# Patient Record
Sex: Male | Born: 1950
Health system: Southern US, Community
[De-identification: ages and names within clinical notes are randomized; demographics above are authoritative.]

## PROBLEM LIST (undated history)

## (undated) DIAGNOSIS — F419 Anxiety disorder, unspecified: Secondary | ICD-10-CM

## (undated) DIAGNOSIS — Z85828 Personal history of other malignant neoplasm of skin: Secondary | ICD-10-CM

## (undated) DIAGNOSIS — J9600 Acute respiratory failure, unspecified whether with hypoxia or hypercapnia: Secondary | ICD-10-CM

## (undated) DIAGNOSIS — C4492 Squamous cell carcinoma of skin, unspecified: Secondary | ICD-10-CM

## (undated) DIAGNOSIS — R1310 Dysphagia, unspecified: Secondary | ICD-10-CM

## (undated) DIAGNOSIS — B1920 Unspecified viral hepatitis C without hepatic coma: Secondary | ICD-10-CM

## (undated) DIAGNOSIS — I219 Acute myocardial infarction, unspecified: Secondary | ICD-10-CM

## (undated) DIAGNOSIS — C4491 Basal cell carcinoma of skin, unspecified: Secondary | ICD-10-CM

## (undated) DIAGNOSIS — R519 Headache, unspecified: Secondary | ICD-10-CM

## (undated) DIAGNOSIS — C140 Malignant neoplasm of pharynx, unspecified: Secondary | ICD-10-CM

## (undated) DIAGNOSIS — J449 Chronic obstructive pulmonary disease, unspecified: Secondary | ICD-10-CM

## (undated) DIAGNOSIS — R490 Dysphonia: Secondary | ICD-10-CM

## (undated) DIAGNOSIS — F17210 Nicotine dependence, cigarettes, uncomplicated: Secondary | ICD-10-CM

## (undated) DIAGNOSIS — R0600 Dyspnea, unspecified: Secondary | ICD-10-CM

## (undated) DIAGNOSIS — G4730 Sleep apnea, unspecified: Secondary | ICD-10-CM

## (undated) DIAGNOSIS — K029 Dental caries, unspecified: Secondary | ICD-10-CM

## (undated) DIAGNOSIS — C61 Malignant neoplasm of prostate: Secondary | ICD-10-CM

## (undated) DIAGNOSIS — Z79818 Long term (current) use of other agents affecting estrogen receptors and estrogen levels: Secondary | ICD-10-CM

## (undated) DIAGNOSIS — N529 Male erectile dysfunction, unspecified: Secondary | ICD-10-CM

## (undated) HISTORY — DX: Personal history of other malignant neoplasm of skin: Z85.828

## (undated) HISTORY — DX: Chronic obstructive pulmonary disease, unspecified: J44.9

## (undated) HISTORY — DX: Malignant neoplasm of prostate: C61

## (undated) HISTORY — DX: Squamous cell carcinoma of skin, unspecified: C44.92

## (undated) HISTORY — PX: PROSTATE BIOPSY: SHX241

## (undated) HISTORY — PX: FACIAL LACERATION REPAIR: SHX6589

## (undated) HISTORY — PX: SKIN CANCER EXCISION: SHX779

## (undated) HISTORY — DX: Basal cell carcinoma of skin, unspecified: C44.91

## (undated) HISTORY — PX: CHOLECYSTECTOMY: SHX55

## (undated) HISTORY — DX: Malignant neoplasm of pharynx, unspecified: C14.0

## (undated) HISTORY — DX: Unspecified viral hepatitis C without hepatic coma: B19.20

---

## 1990-01-18 HISTORY — PX: HERNIA REPAIR: SHX51

## 2017-09-27 DIAGNOSIS — R0689 Other abnormalities of breathing: Secondary | ICD-10-CM | POA: Diagnosis not present

## 2017-09-27 DIAGNOSIS — F17210 Nicotine dependence, cigarettes, uncomplicated: Secondary | ICD-10-CM | POA: Diagnosis present

## 2017-09-27 DIAGNOSIS — J9622 Acute and chronic respiratory failure with hypercapnia: Secondary | ICD-10-CM | POA: Diagnosis present

## 2017-09-27 DIAGNOSIS — J440 Chronic obstructive pulmonary disease with acute lower respiratory infection: Secondary | ICD-10-CM | POA: Diagnosis present

## 2017-09-27 DIAGNOSIS — R0602 Shortness of breath: Secondary | ICD-10-CM | POA: Diagnosis not present

## 2017-09-27 DIAGNOSIS — J449 Chronic obstructive pulmonary disease, unspecified: Secondary | ICD-10-CM | POA: Diagnosis not present

## 2017-09-27 DIAGNOSIS — J9602 Acute respiratory failure with hypercapnia: Secondary | ICD-10-CM | POA: Diagnosis not present

## 2017-09-27 DIAGNOSIS — R918 Other nonspecific abnormal finding of lung field: Secondary | ICD-10-CM | POA: Diagnosis not present

## 2017-09-27 DIAGNOSIS — J209 Acute bronchitis, unspecified: Secondary | ICD-10-CM | POA: Diagnosis present

## 2017-09-27 DIAGNOSIS — J441 Chronic obstructive pulmonary disease with (acute) exacerbation: Secondary | ICD-10-CM | POA: Diagnosis not present

## 2017-09-27 DIAGNOSIS — E871 Hypo-osmolality and hyponatremia: Secondary | ICD-10-CM | POA: Diagnosis not present

## 2017-09-27 DIAGNOSIS — E878 Other disorders of electrolyte and fluid balance, not elsewhere classified: Secondary | ICD-10-CM | POA: Diagnosis not present

## 2017-09-27 DIAGNOSIS — Z9981 Dependence on supplemental oxygen: Secondary | ICD-10-CM | POA: Diagnosis not present

## 2017-09-27 DIAGNOSIS — C61 Malignant neoplasm of prostate: Secondary | ICD-10-CM | POA: Diagnosis present

## 2017-09-27 DIAGNOSIS — Z79899 Other long term (current) drug therapy: Secondary | ICD-10-CM | POA: Diagnosis not present

## 2017-10-04 DIAGNOSIS — J9611 Chronic respiratory failure with hypoxia: Secondary | ICD-10-CM | POA: Diagnosis not present

## 2017-10-04 DIAGNOSIS — J449 Chronic obstructive pulmonary disease, unspecified: Secondary | ICD-10-CM | POA: Diagnosis not present

## 2017-10-04 DIAGNOSIS — R0902 Hypoxemia: Secondary | ICD-10-CM | POA: Diagnosis not present

## 2017-10-16 DIAGNOSIS — Z792 Long term (current) use of antibiotics: Secondary | ICD-10-CM | POA: Diagnosis not present

## 2017-10-16 DIAGNOSIS — E871 Hypo-osmolality and hyponatremia: Secondary | ICD-10-CM | POA: Diagnosis present

## 2017-10-16 DIAGNOSIS — F17210 Nicotine dependence, cigarettes, uncomplicated: Secondary | ICD-10-CM | POA: Diagnosis present

## 2017-10-16 DIAGNOSIS — R918 Other nonspecific abnormal finding of lung field: Secondary | ICD-10-CM | POA: Diagnosis not present

## 2017-10-16 DIAGNOSIS — J181 Lobar pneumonia, unspecified organism: Secondary | ICD-10-CM | POA: Diagnosis not present

## 2017-10-16 DIAGNOSIS — Z7289 Other problems related to lifestyle: Secondary | ICD-10-CM | POA: Diagnosis not present

## 2017-10-16 DIAGNOSIS — Z79899 Other long term (current) drug therapy: Secondary | ICD-10-CM | POA: Diagnosis not present

## 2017-10-16 DIAGNOSIS — J189 Pneumonia, unspecified organism: Secondary | ICD-10-CM | POA: Diagnosis not present

## 2017-10-16 DIAGNOSIS — A419 Sepsis, unspecified organism: Secondary | ICD-10-CM | POA: Diagnosis not present

## 2017-10-16 DIAGNOSIS — Z9981 Dependence on supplemental oxygen: Secondary | ICD-10-CM | POA: Diagnosis not present

## 2017-10-16 DIAGNOSIS — R0609 Other forms of dyspnea: Secondary | ICD-10-CM | POA: Diagnosis not present

## 2017-10-16 DIAGNOSIS — J440 Chronic obstructive pulmonary disease with acute lower respiratory infection: Secondary | ICD-10-CM | POA: Diagnosis present

## 2017-10-16 DIAGNOSIS — C61 Malignant neoplasm of prostate: Secondary | ICD-10-CM | POA: Diagnosis not present

## 2017-10-16 DIAGNOSIS — J449 Chronic obstructive pulmonary disease, unspecified: Secondary | ICD-10-CM | POA: Diagnosis not present

## 2017-10-16 DIAGNOSIS — Z7951 Long term (current) use of inhaled steroids: Secondary | ICD-10-CM | POA: Diagnosis not present

## 2017-10-20 DIAGNOSIS — F419 Anxiety disorder, unspecified: Secondary | ICD-10-CM | POA: Diagnosis not present

## 2017-10-20 DIAGNOSIS — J189 Pneumonia, unspecified organism: Secondary | ICD-10-CM | POA: Diagnosis not present

## 2017-10-25 DIAGNOSIS — J42 Unspecified chronic bronchitis: Secondary | ICD-10-CM | POA: Diagnosis not present

## 2017-10-25 DIAGNOSIS — J441 Chronic obstructive pulmonary disease with (acute) exacerbation: Secondary | ICD-10-CM | POA: Diagnosis not present

## 2017-11-14 DIAGNOSIS — F419 Anxiety disorder, unspecified: Secondary | ICD-10-CM | POA: Diagnosis not present

## 2017-11-14 DIAGNOSIS — J449 Chronic obstructive pulmonary disease, unspecified: Secondary | ICD-10-CM | POA: Diagnosis not present

## 2017-11-14 DIAGNOSIS — C61 Malignant neoplasm of prostate: Secondary | ICD-10-CM | POA: Diagnosis not present

## 2017-12-12 DIAGNOSIS — J449 Chronic obstructive pulmonary disease, unspecified: Secondary | ICD-10-CM | POA: Diagnosis not present

## 2017-12-12 DIAGNOSIS — F419 Anxiety disorder, unspecified: Secondary | ICD-10-CM | POA: Diagnosis not present

## 2018-01-13 ENCOUNTER — Encounter: Payer: Self-pay | Admitting: Nurse Practitioner

## 2018-01-13 ENCOUNTER — Ambulatory Visit (INDEPENDENT_AMBULATORY_CARE_PROVIDER_SITE_OTHER): Payer: Medicare Other | Admitting: Nurse Practitioner

## 2018-01-13 VITALS — BP 113/66 | HR 85 | Temp 97.8°F | Ht 67.0 in | Wt 107.4 lb

## 2018-01-13 DIAGNOSIS — Z85828 Personal history of other malignant neoplasm of skin: Secondary | ICD-10-CM | POA: Insufficient documentation

## 2018-01-13 DIAGNOSIS — J449 Chronic obstructive pulmonary disease, unspecified: Secondary | ICD-10-CM | POA: Diagnosis not present

## 2018-01-13 DIAGNOSIS — F411 Generalized anxiety disorder: Secondary | ICD-10-CM | POA: Insufficient documentation

## 2018-01-13 DIAGNOSIS — C61 Malignant neoplasm of prostate: Secondary | ICD-10-CM

## 2018-01-13 DIAGNOSIS — Z5181 Encounter for therapeutic drug level monitoring: Secondary | ICD-10-CM | POA: Diagnosis not present

## 2018-01-13 NOTE — Assessment & Plan Note (Signed)
Urine drug screen today.  If negative will sign controlled substance contract next visit and continue Klonopin with every 3 month visits for refills and close monitoring.  Pt refuses alternative options, such as SSRI or Buspar.

## 2018-01-13 NOTE — Patient Instructions (Signed)
Tobacco Use Disorder Tobacco use disorder (TUD) occurs when a person craves, seeks, and uses tobacco, regardless of the consequences. This disorder can cause problems with mental and physical health. It can affect your ability to have healthy relationships, and it can keep you from meeting your responsibilities at work, home, or school. Tobacco may be:  Smoked as a cigarette or cigar.  Inhaled using e-cigarettes.  Smoked in a pipe or hookah.  Chewed as smokeless tobacco.  Inhaled into the nostrils as snuff. Tobacco products contain a dangerous chemical called nicotine, which is very addictive. Nicotine triggers hormones that make the body feel stimulated and works on areas of the brain that make you feel good. These effects can make it hard for people to quit nicotine. Tobacco contains many other unsafe chemicals that can damage almost every organ in the body. Smoking tobacco also puts others in danger due to fire risk and possible health problems caused by breathing in secondhand smoke. What are the signs or symptoms? Symptoms of TUD may include:  Being unable to slow down or stop your tobacco use.  Spending an abnormal amount of time getting or using tobacco.  Craving tobacco. Cravings may last for up to 6 months after quitting.  Tobacco use that: ? Interferes with your work, school, or home life. ? Interferes with your personal and social relationships. ? Makes you give up activities that you once enjoyed or found important.  Using tobacco even though you know that it is: ? Dangerous or bad for your health or someone else's health. ? Causing problems in your life.  Needing more and more of the substance to get the same effect (developing tolerance).  Experiencing unpleasant symptoms if you do not use the substance (withdrawal). Withdrawal symptoms may include: ? Depressed, anxious, or irritable mood. ? Difficulty concentrating. ? Increased appetite. ? Restlessness or trouble  sleeping.  Using the substance to avoid withdrawal. How is this diagnosed? This condition may be diagnosed based on:  Your current and past tobacco use. Your health care provider may ask questions about how your tobacco use affects your life.  A physical exam. You may be diagnosed with TUD if you have at least two symptoms within a 12-month period. How is this treated? This condition is treated by stopping tobacco use. Many people are unable to quit on their own and need help. Treatment may include:  Nicotine replacement therapy (NRT). NRT provides nicotine without the other harmful chemicals in tobacco. NRT gradually lowers the dosage of nicotine in the body and reduces withdrawal symptoms. NRT is available as: ? Over-the-counter gums, lozenges, and skin patches. ? Prescription mouth inhalers and nasal sprays.  Medicine that acts on the brain to reduce cravings and withdrawal symptoms.  A type of talk therapy that examines your triggers for tobacco use, how to avoid them, and how to cope with cravings (behavioral therapy).  Hypnosis. This may help with withdrawal symptoms.  Joining a support group for others coping with TUD. The best treatment for TUD is usually a combination of medicine, talk therapy, and support groups. Recovery can be a long process. Many people start using tobacco again after stopping (relapse). If you relapse, it does not mean that treatment will not work. Follow these instructions at home:  Lifestyle  Do not use any products that contain nicotine or tobacco, such as cigarettes and e-cigarettes.  Avoid things that trigger tobacco use as much as you can. Triggers include people and situations that usually cause you   to use tobacco.  Avoid drinks that contain caffeine, including coffee. These may worsen some withdrawal symptoms.  Find ways to manage stress. Wanting to smoke may cause stress, and stress can make you want to smoke. Relaxation techniques such as  deep breathing, meditation, and yoga may help.  Attend support groups as needed. These groups are an important part of long-term recovery for many people. General instructions  Take over-the-counter and prescription medicines only as told by your health care provider.  Check with your health care provider before taking any new prescription or over-the-counter medicines.  Decide on a friend, family member, or smoking quit-line (such as 1-800-QUIT-NOW in the [place] that you can call or text when you feel the urge to smoke or when you need help coping with cravings.  Keep all follow-up visits as told by your health care provider and therapist. This is important. Contact a health care provider if:  You are not able to take your medicines as prescribed.  Your symptoms get worse, even with treatment. Summary  Tobacco use disorder (TUD) occurs when a person craves, seeks, and uses tobacco regardless of the consequences.  This condition may be diagnosed based on your current and past tobacco use and a physical exam.  Many people are unable to quit on their own and need help. Recovery can be a long process.  The most effective treatment for TUD is usually a combination of medicine, talk therapy, and support groups. This information is not intended to replace advice given to you by your health care provider. Make sure you discuss any questions you have with your health care provider. Document Released: 09/10/2003 Document Revised: 12/22/2016 Document Reviewed: 12/22/2016 Elsevier Interactive Patient Education  2019 Elsevier Inc.  

## 2018-01-13 NOTE — Assessment & Plan Note (Signed)
Referral to urology for further treatment and recommendations.  Patient currently treated with Lupron with past specialist.

## 2018-01-13 NOTE — Assessment & Plan Note (Signed)
Referral to dermatology for skin assessment in patient with significant history of basal and squamous cell removal.

## 2018-01-13 NOTE — Assessment & Plan Note (Signed)
Chronic, ongoing.  Continue current inhaler regimen and home O2.  Referral to pulmonology for further recommendations.

## 2018-01-13 NOTE — Progress Notes (Addendum)
New Patient Office Visit  Subjective:  Patient ID: Kevin Nelson, male    DOB: 1950/08/30  Age: 67 y.o. MRN: [id number]  CC:  Chief Complaint  Patient presents with  . Establish Care    HPI Kevin Nelson presents for new patient visit to establish care.  Introduced to [employer], [employer] role and practice setting.  All questions answered.  [place] and providers had all been there, prior to this lived in [place].  Will have all records transferred to current clinic.  He is currently living locally.  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  Was followed by dermatology, would like referral to dermatology locally for regular skin exams.  States he has some current "moles" which he knows dermatology will have to remove.    PROSTATE CANCER: Has been followed by urology for, had a "PSA of 78", reports provider did not want to perform surgery or chemo/radiation d/t patient "frailty and severe COPD".  Every 6 months he was receiving Lupron, then Medicaid refused to pay for it.  Lupron he reports brought PSA from 78 to 30's.  He was able to get Lupron shots back beginning of this year and reports his next urology assessment and shot should be in January.  Requests referral to urology for continued follow-up.  Will have his current records transferred.    ANXIETY: Uses Clonazepam at home.  Currently takes two of them (0.5MG ) daily as needed, but states he usually takes "two a day".  Reports the Lupron causes "hot flashes" and anxiety.  States he has been on Clonazepam for over a year and a half, which helps him to rest at night and helps his anxiety.  States his mother "gave me this when I was young to help me sleep".  We had at length discussion about SSRI treatment and evidence for anxiety disorder, which he is not interested in a this time.  Pt is aware of risks of benzo medication use  to include increased sedation, respiratory suppression, falls, dependence and cardiovascular events.  Pt would like to continue treatment as benefit  determined to outweigh risk.  He is in agreeance to obtain a urine drug screen today and is aware that this medication will not be refilled at this visit, which he states he is "okay" with.  Also agrees to regular 3 month visits and signing a controlled substance contract with provider for medication at this next visit.  Discussed with him that if he does not make scheduled appointments, refills will not be provided.  Will also have his records transferred for further assessment of regimen.   Also reports he drinks 7 alcohol drinks a week, mainly beer, discussed risk of Klonopin use with alcohol.  He reports he does not take this when drinking.  SEVERE COPD: Uses inhalers.  Smokes three cigarettes a day, has cut back. Was smoking 40 a day per his report.  Uses oxygen at home and requests home oxygen set-up locally.  Uses 2.5 L as needed at home.  Discussed referral to Pulmonology with patient, he agrees to referral.  Currently he uses Combivent and Albuterol as needed, states uses this approx. 2 times a week. Denies SOB, CP, or weight loss.  States at baseline has intermittent cough.   COPD status: stable Satisfied with current treatment?: yes Oxygen use: yes, at home Dyspnea frequency:  Cough frequency:  Rescue inhaler frequency:   Limitation of activity: no Productive cough:  Last Spirometry:  Pneumovax: Not up to Date, administer next visit Influenza: Up to Date  Depression screen [facility] 2/9 01/13/2018  Decreased Interest 1  Down, Depressed, Hopeless 2  PHQ - 2 Score 3  Altered sleeping 3  Tired, decreased energy 2  Change in appetite 2  Feeling bad or failure about yourself  0  Trouble concentrating 0  Moving slowly or fidgety/restless 0  Suicidal thoughts 0  PHQ-9 Score 10   GAD 7 : Generalized Anxiety Score 01/13/2018  Nervous, Anxious,  on Edge 1  Control/stop worrying 1  Worry too much - different things 1  Trouble relaxing 1  Restless 0  Easily annoyed or irritable 0  Afraid - awful might happen 1  Total GAD 7 Score 5  Anxiety Difficulty Somewhat difficult    Past Medical History:  Diagnosis Date  . Basal cell carcinoma   . End stage COPD ([place])   . History of nonmelanoma skin cancer   . Prostate cancer ([place])   . Squamous cell skin cancer     Past Surgical History:  Procedure Laterality Date  . CHOLECYSTECTOMY    . FACIAL LACERATION REPAIR    . SKIN CANCER EXCISION      Family History  Problem Relation Age of Onset  . Cancer Sister   . Cancer Mother   . Heart attack Brother   . Heart attack Maternal Grandfather     Social History   Socioeconomic History  . Marital status: Divorced    Spouse name: Not on file  . Number of children: Not on file  . Years of education: Not on file  . Highest education level: Not on file  Occupational History  . Not on file  Social Needs  . Financial resource strain: Not on file  . Food insecurity:    Worry: Not on file    Inability: Not on file  . Transportation needs:    Medical: Not on file    Non-medical: Not on file  Tobacco Use  . Smoking status: Current Every Day Smoker    Types: Cigarettes  . Smokeless tobacco: Never Used  Substance and Sexual Activity  . Alcohol use: Yes    Alcohol/week: 7.0 standard drinks    Types: 7 Cans of beer per week  . Drug use: Never  . Sexual activity: Not on file  Lifestyle  . Physical activity:    Days per week: Not on file    Minutes per session: Not on file  . Stress: Not on file  Relationships  . Social connections:    Talks on phone: Not on file    Gets together: Not on file    Attends religious service: Not on file    Active member of club or organization: Not on file    Attends meetings of clubs or organizations: Not on file    Relationship status: Not on file  . Intimate partner violence:    Fear of  current or ex partner: Not on file    Emotionally abused: Not on file    Physically abused: Not on file    Forced sexual activity: Not on file  Other Topics Concern  . Not on file  Social History Narrative  . Not on file    ROS Review of Systems  Constitutional: Negative for activity change, diaphoresis, fatigue and fever.  Respiratory: Positive for cough (intermittent). Negative for chest  tightness, shortness of breath and wheezing.   Cardiovascular: Negative for chest pain, palpitations and leg swelling.  Gastrointestinal: Negative for abdominal distention, abdominal pain, constipation, diarrhea, nausea and vomiting.  Endocrine: Negative for cold intolerance, heat intolerance, polydipsia, polyphagia and polyuria.  Musculoskeletal: Negative.   Skin: Negative.   Neurological: Negative for dizziness, syncope, weakness, light-headedness, numbness and headaches.  Psychiatric/Behavioral: Negative.     Objective:   Today's Vitals: BP 113/66 (BP Location: Left Arm, Patient Position: Sitting, Cuff Size: Normal)   Pulse 85   Temp 97.8 F (36.6 C) (Oral)   Ht 5\' 7"  (1.702 m)   Wt 107 lb 6.4 oz (48.7 kg)   SpO2 93%   BMI 16.82 kg/m   Physical Exam Vitals signs and nursing note reviewed.  Constitutional:      General: He is awake.     Appearance: He is well-developed. He is cachectic.  HENT:     Head: Normocephalic and atraumatic.     Right Ear: Hearing normal. No drainage.     Left Ear: Hearing normal. No drainage.     Mouth/Throat:     Pharynx: Uvula midline.  Eyes:     General: Lids are normal.        Right eye: No discharge.        Left eye: No discharge.     Conjunctiva/sclera: Conjunctivae normal.     Pupils: Pupils are equal, round, and reactive to light.  Neck:     Musculoskeletal: Normal range of motion and neck supple.     Thyroid: No thyromegaly.     Vascular: No carotid bruit or JVD.     Trachea: Trachea normal.  Cardiovascular:     Rate and Rhythm: Normal  rate and regular rhythm.     Heart sounds: Normal heart sounds, S1 normal and S2 normal. No murmur. No gallop.   Pulmonary:     Effort: Pulmonary effort is normal.     Breath sounds: Wheezing present.     Comments: Intermittent scattered wheezes throughout.   Abdominal:     General: Bowel sounds are normal.     Palpations: Abdomen is soft. There is no hepatomegaly or splenomegaly.  Musculoskeletal: Normal range of motion.  Skin:    General: Skin is warm and dry.     Capillary Refill: Capillary refill takes less than 2 seconds.     Findings: No rash.  Neurological:     Mental Status: He is alert and oriented to person, place, and time.     Deep Tendon Reflexes: Reflexes are normal and symmetric.  Psychiatric:        Mood and Affect: Mood normal.        Behavior: Behavior normal. Behavior is cooperative.        Thought Content: Thought content normal.        Judgment: Judgment normal.     Assessment & Plan:   Problem List Items Addressed This Visit      Respiratory   End stage COPD ([place]) - Primary    Chronic, ongoing.  Continue current inhaler regimen and home O2.  Referral to pulmonology for further recommendations.      Relevant Medications   albuterol (PROVENTIL) (2.5 MG/3ML) 0.083% nebulizer solution   Ipratropium-Albuterol (COMBIVENT RESPIMAT) 20-100 MCG/ACT AERS respimat   Other Relevant Orders   Ambulatory referral to Pulmonology     Musculoskeletal and Integument   History of basal cell carcinoma ([facility])    Referral to dermatology for skin assessment in  patient with significant history of basal and squamous cell removal.      Relevant Medications   leuprolide (LUPRON) 30 MG injection   Other Relevant Orders   Ambulatory referral to Dermatology     Genitourinary   Prostate cancer [facility])    Referral to urology for further treatment and recommendations.  Patient currently treated with Lupron with past specialist.        Relevant Medications   leuprolide  (LUPRON) 30 MG injection   Other Relevant Orders   Ambulatory referral to Urology     Other   Generalized anxiety disorder    Urine drug screen today.  If negative will sign controlled substance contract next visit and continue Klonopin with every 3 month visits for refills and close monitoring.  Pt refuses alternative options, such as SSRI or Buspar.      Relevant Orders   Urine drugs of abuse scrn w alc, routine (Ref Lab)      Outpatient Encounter Medications as of 01/13/2018  Medication Sig  . albuterol (PROVENTIL) (2.5 MG/3ML) 0.083% nebulizer solution Take 2.5 mg by nebulization every 6 (six) hours as needed for wheezing or shortness of breath.  . clonazePAM (KLONOPIN) 0.5 MG tablet Take 0.5 mg by mouth 2 (two) times daily as needed for anxiety.  . Ipratropium-Albuterol (COMBIVENT RESPIMAT) 20-100 MCG/ACT AERS respimat Inhale 1 puff into the lungs every 6 (six) hours.  [name] leuprolide (LUPRON) 30 MG injection Inject 30 mg into the muscle every 6 (six) months.   No facility-administered encounter medications on file as of 01/13/2018.     Follow-up: Return in about 2 weeks (around 01/27/2018) for Follow-up COPD.   [name], NP

## 2018-01-18 LAB — URINE DRUGS OF ABUSE SCREEN W ALC, ROUTINE (REF LAB)
Amphetamines, Urine: NEGATIVE ng/mL
Barbiturate Quant, Ur: NEGATIVE ng/mL
Cannabinoid Quant, Ur: NEGATIVE ng/mL
Cocaine (Metab.): NEGATIVE ng/mL
Ethanol, Urine: NEGATIVE %
Methadone Screen, Urine: NEGATIVE ng/mL
Opiate Quant, Ur: NEGATIVE ng/mL
PCP Quant, Ur: NEGATIVE ng/mL
Propoxyphene: NEGATIVE ng/mL

## 2018-01-18 LAB — DRUG PROFILE 799031: BENZODIAZEPINES: NEGATIVE

## 2018-01-18 NOTE — Progress Notes (Signed)
Normal test results noted.  Please call patient and make them aware of normal results and will continue to monitor at regular visits.  Have a great day

## 2018-01-19 ENCOUNTER — Telehealth: Payer: Self-pay | Admitting: Nurse Practitioner

## 2018-01-19 NOTE — Telephone Encounter (Signed)
Copied from [place] [id number]. Topic: Quick Communication - Lab Results (Clinic Use ONLY) >> Jan 19, 2018  8:47 AM Georgina Peer, CMA wrote: Called patient to inform them of lab results. When patient returns call, triage nurse may disclose results. Result note routed to nurse triage pool. >> Jan 19, 2018  9:12 AM Valla Leaver wrote: Lab results, no [facility] RN available

## 2018-01-20 NOTE — Telephone Encounter (Signed)
See result note. Pt given results on 01/19/18

## 2018-01-24 ENCOUNTER — Telehealth: Payer: Self-pay | Admitting: Nurse Practitioner

## 2018-01-24 NOTE — Telephone Encounter (Signed)
Called [facility] at [phone] and got patient scheduled for tomorrow at 10:30

## 2018-01-24 NOTE — Telephone Encounter (Signed)
Patient was scheduled w/ [facility] for tomorrow at 10:30 a.m.

## 2018-01-24 NOTE — Telephone Encounter (Signed)
Copied from [place] [id number]. Topic: General - Inquiry >> Jan 24, 2018  9:49 AM Judyann Munson wrote: Reason for CRM: patient is calling to state he is unsure  if he spoke with his doctor in regards to having his in home oxygen tank. He is requesting a call back. >> Jan 24, 2018 10:23 AM Amada Kingfisher, CMA wrote: It does appear that this was dicussed. Unsure where we are in the process of getting patient O2 at home.

## 2018-01-24 NOTE — Telephone Encounter (Signed)
-----   Message from Amada Kingfisher, [place] sent at 01/24/2018  1:07 PM EST ----- See if he can come in tomorrow afternoon for visit and we will obtain these then since I am not sure when pulmonology will get him in.  Then I will place referral to [facility].  Is this a home health referral? ----- Message ----- From: Amada Kingfisher, CMA Sent: 01/24/2018  12:37 PM EST To: [name], NP Subject: RE: Referral                                   We can also order O2 from [facility]. We must have documentation of sp02 on room air and then on o2 to show improvement w/ oxygen.  ----- Message ----- From: [name], NP Sent: 01/24/2018  12:32 PM EST To: Amada Kingfisher, CMA Subject: Referral                                       I placed pulmonology consult and it looks like they have approved, but no scheduled appointment.  Apolonio Schneiders had mentioned this is a good way to get home O2 order in place.  Can we look into this referral?  Do you know alternate options for patient to obtain home O2, who would I refer to for this.

## 2018-01-25 ENCOUNTER — Telehealth: Payer: Self-pay | Admitting: Pulmonary Disease

## 2018-01-25 ENCOUNTER — Encounter: Payer: Self-pay | Admitting: Pulmonary Disease

## 2018-01-25 ENCOUNTER — Ambulatory Visit (INDEPENDENT_AMBULATORY_CARE_PROVIDER_SITE_OTHER): Payer: Medicare Other | Admitting: Pulmonary Disease

## 2018-01-25 VITALS — BP 124/70 | HR 86 | Ht 67.0 in | Wt 108.4 lb

## 2018-01-25 DIAGNOSIS — F17200 Nicotine dependence, unspecified, uncomplicated: Secondary | ICD-10-CM | POA: Diagnosis not present

## 2018-01-25 DIAGNOSIS — R062 Wheezing: Secondary | ICD-10-CM | POA: Diagnosis not present

## 2018-01-25 DIAGNOSIS — R636 Underweight: Secondary | ICD-10-CM

## 2018-01-25 DIAGNOSIS — J449 Chronic obstructive pulmonary disease, unspecified: Secondary | ICD-10-CM | POA: Diagnosis not present

## 2018-01-25 DIAGNOSIS — J9611 Chronic respiratory failure with hypoxia: Secondary | ICD-10-CM

## 2018-01-25 MED ORDER — TIOTROPIUM BROMIDE-OLODATEROL 2.5-2.5 MCG/ACT IN AERS: 2.0000 | INHALATION_SPRAY | Freq: Every day | RESPIRATORY_TRACT | 10 refills | Status: DC

## 2018-01-25 NOTE — Telephone Encounter (Signed)
Received fax from [facility] stating that Kevin Nelson is not covered by insurance.  I have spoken to Kevin Nelson with [employer], who states covered alternatives are atrovent HFA, spiriva handihaler & spiriva respimat.  Dr. Alva Nelson please advise on alternative. Thanks

## 2018-01-25 NOTE — Patient Instructions (Signed)
Diagnostic studies ordered to qualify you for home oxygen therapy: Overnight oximetry 6 minute walk test  Diagnostic studies to assess your COPD: Pulmonary function tests (PFTs) to be performed prior to next visit Chest x-ray to be performed prior to next visit  Therapeutic: Stiolto inhaler, 2 actuations daily.  Sample provided.  Prescription entered. Smoking cessation as discussed Combivent inhaler as needed for increased shortness of breath, wheezing, chest tightness, cough  Follow-up in 3-4 weeks

## 2018-01-26 ENCOUNTER — Encounter: Payer: Self-pay | Admitting: Pulmonary Disease

## 2018-01-26 ENCOUNTER — Encounter: Payer: Self-pay | Admitting: Nurse Practitioner

## 2018-01-26 DIAGNOSIS — J9611 Chronic respiratory failure with hypoxia: Secondary | ICD-10-CM | POA: Insufficient documentation

## 2018-01-26 NOTE — Telephone Encounter (Signed)
None of those are equivalent alternatives... Check if there is coverage for Anoro, Bevespi or Utibron inhalers. Anoro preferred  [employer]

## 2018-01-26 NOTE — Progress Notes (Addendum)
PULMONARY CONSULT NOTE  Requesting MD/Service: Marnee Guarneri, NP Date of initial consultation: 01/25/18 Reason for consultation: Very severe COPD, smoker  PT PROFILE: 68 y.o. male smoker (started age 32, maximum 2 PPD, presently 44 6/day) recently moved to the area with history of oxygen dependent COPD wishes to establish care for COPD/pulmonary issues  DATA:  INTERVAL:  HPI:  As above.  He is presently at his baseline.  His previous care was in [facility].  He was first diagnosed with COPD 7-8 years ago.  However, he was diagnosed with chronic bronchitis almost 30 years ago.  He was started on oxygen therapy in 2018.  He wears oxygen with sleep and approximately 4 hours/day.  He does not like to wear it when he is out of the house.  He has been hospitalized for COPD approximately 4 times in the past 2 years.  He is never been intubated.  At his baseline, he has class III exertional dyspnea with day-to-day variation.  He states that he has "more bad days than good days".  He is disabled and on the basis of COPD.  He has daily cough productive of clear to white mucus.  He denies hemoptysis, chest pain, fever, purulent sputum, orthopnea, PND, lower extremity edema, calf tenderness.  Most importantly in his past medical history, he has had innumerable skin cancers removed and has prostate cancer for which she is on hormonal therapy.  Past Medical History:  Diagnosis Date  . Basal cell carcinoma   . End stage COPD ([place])   . History of nonmelanoma skin cancer   . Prostate cancer ([place])   . Squamous cell skin cancer     Past Surgical History:  Procedure Laterality Date  . CHOLECYSTECTOMY    . FACIAL LACERATION REPAIR    . SKIN CANCER EXCISION      MEDICATIONS: I have reviewed all medications and confirmed regimen as documented  Social History   Socioeconomic History  . Marital status: Divorced    Spouse name: Not on file  . Number of children: Not on file  . Years of  education: Not on file  . Highest education level: Not on file  Occupational History  . Not on file  Social Needs  . Financial resource strain: Not on file  . Food insecurity:    Worry: Not on file    Inability: Not on file  . Transportation needs:    Medical: Not on file    Non-medical: Not on file  Tobacco Use  . Smoking status: Current Every Day Smoker    Packs/day: 2.00    Years: 51.00    Pack years: 102.00    Types: Cigarettes  . Smokeless tobacco: Never Used  Substance and Sexual Activity  . Alcohol use: Yes    Alcohol/week: 3.0 standard drinks    Types: 3 Cans of beer per week  . Drug use: Never  . Sexual activity: Not on file  Lifestyle  . Physical activity:    Days per week: Not on file    Minutes per session: Not on file  . Stress: Not on file  Relationships  . Social connections:    Talks on phone: Not on file    Gets together: Not on file    Attends religious service: Not on file    Active member of club or organization: Not on file    Attends meetings of clubs or organizations: Not on file    Relationship status: Not on file  .  Intimate partner violence:    Fear of current or ex partner: Not on file    Emotionally abused: Not on file    Physically abused: Not on file    Forced sexual activity: Not on file  Other Topics Concern  . Not on file  Social History Narrative  . Not on file    Family History  Problem Relation Age of Onset  . Cancer Sister   . Cancer - Cervical Mother   . Heart attack Brother   . Heart attack Maternal Grandfather     ROS: No fever, myalgias/arthralgias, unexplained weight loss or weight gain No new focal weakness or sensory deficits No otalgia, hearing loss, visual changes, nasal and sinus symptoms, mouth and throat problems No neck pain or adenopathy No abdominal pain, N/V/D, diarrhea, change in bowel pattern No dysuria, change in urinary pattern   Vitals:   01/25/18 1058  BP: 124/70  Pulse: 86  SpO2: 93%   Weight: 108 lb 6.4 oz (49.2 kg)  Height: 5\' 7"  (1.702 m)  Room air   EXAM:  Gen: Very thin, not cachectic, tremulous, appears much older than true age, No overt respiratory distress HEENT: NCAT, sclera white, oropharynx normal Neck: Supple without LAN, thyromegaly JVP not visualized (bearded) Lungs: breath sounds markedly diminished with hyperresonance to percussion.  Scattered, very distant wheezes Cardiovascular: RRR, no murmurs noted Abdomen: Soft, nontender, normal BS Ext: without clubbing, cyanosis, edema Neuro: CNs grossly intact, motor and sensory intact Skin: Limited exam, no lesions noted  DATA:   No flowsheet data found.  No flowsheet data found.  CXR:    I have personally reviewed all chest radiographs reported above including CXRs and CT chest unless otherwise indicated  IMPRESSION:     ICD-10-CM   1. Chronic hypoxemic respiratory failure (HCC) J96.11 6 minute walk    Pulse oximetry, overnight  2. COPD, very severe ([place]) J44.9 Pulmonary Function Test [facility] Only    DG Chest 2 View  3. Smoker, working on quitting F17.200   4. Wheezing R06.2   5. Underweight, working on weight gain R63.6    COPD is apparently very severe though I have no PFTs to confirm this.  The presence of wheezing suggests "room for improvement".  He is on no maintenance therapy and only uses a Combivent inhaler.  We discussed the importance of maintaining a proper body weight in the setting of COPD.  PLAN:  To qualify him for oxygen in this community I have ordered overnight oximetry and 6-minute walk test  I have provided a sample of an prescribed Stiolto inhaler, 2 actuations daily.  He is to continue Combivent inhaler as needed for increased shortness of breath, wheezing, chest tightness, cough  We spoke in detail regarding the need for complete smoking cessation and discussed safer alternatives including nicotine replacement therapy  Follow-up in 3-4 weeks with PFTs and CXR prior to  that visit   Merton Border, MD PCCM service Mobile [phone] Pager [phone] 01/26/2018 12:07 PM

## 2018-01-27 ENCOUNTER — Ambulatory Visit (INDEPENDENT_AMBULATORY_CARE_PROVIDER_SITE_OTHER): Payer: Medicare Other | Admitting: Nurse Practitioner

## 2018-01-27 ENCOUNTER — Encounter: Payer: Self-pay | Admitting: Nurse Practitioner

## 2018-01-27 ENCOUNTER — Telehealth: Payer: Self-pay | Admitting: Nurse Practitioner

## 2018-01-27 VITALS — BP 90/50 | HR 78 | Temp 98.1°F | Ht 67.0 in | Wt 113.4 lb

## 2018-01-27 DIAGNOSIS — F17210 Nicotine dependence, cigarettes, uncomplicated: Secondary | ICD-10-CM | POA: Insufficient documentation

## 2018-01-27 DIAGNOSIS — C61 Malignant neoplasm of prostate: Secondary | ICD-10-CM

## 2018-01-27 DIAGNOSIS — J9611 Chronic respiratory failure with hypoxia: Secondary | ICD-10-CM

## 2018-01-27 DIAGNOSIS — J449 Chronic obstructive pulmonary disease, unspecified: Secondary | ICD-10-CM | POA: Diagnosis not present

## 2018-01-27 DIAGNOSIS — Z79899 Other long term (current) drug therapy: Secondary | ICD-10-CM | POA: Diagnosis not present

## 2018-01-27 MED ORDER — UMECLIDINIUM-VILANTEROL 62.5-25 MCG/INH IN AEPB: 1.0000 | INHALATION_SPRAY | Freq: Every day | RESPIRATORY_TRACT | 2 refills | Status: DC

## 2018-01-27 MED ORDER — IPRATROPIUM-ALBUTEROL 20-100 MCG/ACT IN AERS: 1.0000 | INHALATION_SPRAY | Freq: Four times a day (QID) | RESPIRATORY_TRACT | 3 refills | Status: DC | PRN

## 2018-01-27 MED ORDER — ALBUTEROL SULFATE (2.5 MG/3ML) 0.083% IN NEBU: 2.5000 mg | INHALATION_SOLUTION | Freq: Four times a day (QID) | RESPIRATORY_TRACT | 3 refills | Status: DC | PRN

## 2018-01-27 MED ORDER — CLONAZEPAM 0.5 MG PO TABS: 0.5000 mg | ORAL_TABLET | Freq: Two times a day (BID) | ORAL | 2 refills | Status: DC | PRN

## 2018-01-27 NOTE — Assessment & Plan Note (Signed)
Contract signed and discussion had with patient.

## 2018-01-27 NOTE — Telephone Encounter (Signed)
Can we call [facility] and let them know, not sure it will matter.  I think he will have to wait until his card is active.

## 2018-01-27 NOTE — Assessment & Plan Note (Signed)
Collaborate with urology, has upcoming appointment.

## 2018-01-27 NOTE — Assessment & Plan Note (Signed)
I have recommended complete cessation of tobacco use. I have discussed various options available for assistance with tobacco cessation including over the counter methods (Nicotine gum, patch and lozenges). We also discussed prescription options (Chantix, Nicotine Inhaler / Nasal Spray). The patient is not interested in pursuing any prescription tobacco cessation options at this time.  

## 2018-01-27 NOTE — Patient Instructions (Signed)
Chronic Obstructive Pulmonary Disease Chronic obstructive pulmonary disease (COPD) is a long-term (chronic) lung problem. When you have COPD, it is hard for air to get in and out of your lungs. Usually the condition gets worse over time, and your lungs will never return to normal. There are things you can do to keep yourself as healthy as possible.  Your doctor may treat your condition with: ? Medicines. ? Oxygen. ? Lung surgery.  Your doctor may also recommend: ? Rehabilitation. This includes steps to make your body work better. It may involve a team of specialists. ? Quitting smoking, if you smoke. ? Exercise and changes to your diet. ? Comfort measures (palliative care). Follow these instructions at home: Medicines  Take over-the-counter and prescription medicines only as told by your doctor.  Talk to your doctor before taking any cough or allergy medicines. You may need to avoid medicines that cause your lungs to be dry. Lifestyle  If you smoke, stop. Smoking makes the problem worse. If you need help quitting, ask your doctor.  Avoid being around things that make your breathing worse. This may include smoke, chemicals, and fumes.  Stay active, but remember to rest as well.  Learn and use tips on how to relax.  Make sure you get enough sleep. Most adults need at least 7 hours of sleep every night.  Eat healthy foods. Eat smaller meals more often. Rest before meals. Controlled breathing Learn and use tips on how to control your breathing as told by your doctor. Try:  Breathing in (inhaling) through your nose for 1 second. Then, pucker your lips and breath out (exhale) through your lips for 2 seconds.  Putting one hand on your belly (abdomen). Breathe in slowly through your nose for 1 second. Your hand on your belly should move out. Pucker your lips and breathe out slowly through your lips. Your hand on your belly should move in as you breathe out.  Controlled coughing Learn  and use controlled coughing to clear mucus from your lungs. Follow these steps: 1. Lean your head a little forward. 2. Breathe in deeply. 3. Try to hold your breath for 3 seconds. 4. Keep your mouth slightly open while coughing 2 times. 5. Spit any mucus out into a tissue. 6. Rest and do the steps again 1 or 2 times as needed. General instructions  Make sure you get all the shots (vaccines) that your doctor recommends. Ask your doctor about a flu shot and a pneumonia shot.  Use oxygen therapy and pulmonary rehabilitation if told by your doctor. If you need home oxygen therapy, ask your doctor if you should buy a tool to measure your oxygen level (oximeter).  Make a COPD action plan with your doctor. This helps you to know what to do if you feel worse than usual.  Manage any other conditions you have as told by your doctor.  Avoid going outside when it is very hot, cold, or humid.  Avoid people who have a sickness you can catch (contagious).  Keep all follow-up visits as told by your doctor. This is important. Contact a doctor if:  You cough up more mucus than usual.  There is a change in the color or thickness of the mucus.  It is harder to breathe than usual.  Your breathing is faster than usual.  You have trouble sleeping.  You need to use your medicines more often than usual.  You have trouble doing your normal activities such as getting dressed   or walking around the house. Get help right away if:  You have shortness of breath while resting.  You have shortness of breath that stops you from: ? Being able to talk. ? Doing normal activities.  Your chest hurts for longer than 5 minutes.  Your skin color is more blue than usual.  Your pulse oximeter shows that you have low oxygen for longer than 5 minutes.  You have a fever.  You feel too tired to breathe normally. Summary  Chronic obstructive pulmonary disease (COPD) is a long-term lung problem.  The way your  lungs work will never return to normal. Usually the condition gets worse over time. There are things you can do to keep yourself as healthy as possible.  Take over-the-counter and prescription medicines only as told by your doctor.  If you smoke, stop. Smoking makes the problem worse. This information is not intended to replace advice given to you by your health care provider. Make sure you discuss any questions you have with your health care provider. Document Released: 06/23/2007 Document Revised: 02/09/2016 Document Reviewed: 02/09/2016 Elsevier Interactive Patient Education  2019 Elsevier Inc.  

## 2018-01-27 NOTE — Assessment & Plan Note (Signed)
O2 dependent, with severe COPD.  Continue to collaborate with pulmonology.

## 2018-01-27 NOTE — Telephone Encounter (Signed)
Called and spoke to [place] with [employer], who states that Anoro is covered with a $3.90 co-pay. Pt is aware of change in medications and voiced his understanding. Rx for Anoro has been sent to preferred pharmacy.  Nothing further is needed.

## 2018-01-27 NOTE — Assessment & Plan Note (Signed)
Chronic, ongoing.  Is O2 dependent at home.  Currently followed by pulmonology.  Continue to collaborate with pulmonology.

## 2018-01-27 NOTE — Progress Notes (Signed)
BP (!) 90/50 (BP Location: Left Arm, Patient Position: Sitting, Cuff Size: Normal)   Pulse 78 Comment: apical  Temp 98.1 F (36.7 C) (Oral)   Ht 5\' 7"  (1.702 m)   Wt 113 lb 6.4 oz (51.4 kg)   SpO2 91%   BMI 17.76 kg/m    Subjective:    Patient ID: Kevin Nelson, male    DOB: [birth year], 68 y.o.   MRN: [id number]  HPI: Kevin Nelson is a 68 y.o. male presents for f/u  Chief Complaint  Patient presents with  . COPD  . Medication Refill    Clonzepam, Albuterol Solution, Combivient  . Anxiety    Patient seems very anxious. GAD7 completed 01/13/2018   Plan was to obtain baseline labs today, but patient is working on [employer] as recently received bill for recent labs.  He refuses further labs today and will obtain once he fixes current situation.  COPD Saw Dr. Alva Garnet on 01/25/18 and was provided a sample of Stiolto, he is to continue Combivent as needed and Albuterol nebs.  He is returning in 3 weeks for further testing and visit.  Is dependent on O2 at home per his report. COPD status: stable Satisfied with current treatment?: yes Oxygen use: at home Dyspnea frequency:  Cough frequency:  Rescue inhaler frequency:   Limitation of activity: occasionally Productive cough:  Last Spirometry:  Pneumovax: he believes he has had it Influenza: Up to Date   ANXIETY/STRESS Takes Klonopin twice a day as needed, has been on for years.  Recent drug screen negative.  Last refill 12/19/17 for 45 tablets.  Pt is aware of risks of benzo medication use to include increased sedation, respiratory suppression, falls, dependence and cardiovascular events.  Pt  would like to continue treatment as benefit determined to outweigh risk.  Controlled substance contract signed today.  Patient agrees to use same pharmacy and to be seen every three months for refills.   Duration:stable Anxious mood: yes  Excessive worrying: yes Irritability: no  Sweating: no Nausea: no Palpitations:no Hyperventilation:  no Panic attacks: no Agoraphobia: no  Obscessions/compulsions: no Depressed mood: no Depression screen [facility] 2/9 01/27/2018 01/13/2018  Decreased Interest 1 1  Down, Depressed, Hopeless 2 2  PHQ - 2 Score 3 3  Altered sleeping 3 3  Tired, decreased energy 2 2  Change in appetite 2 2  Feeling bad or failure about yourself  0 0  Trouble concentrating 0 0  Moving slowly or fidgety/restless 0 0  Suicidal thoughts 0 0  PHQ-9 Score 10 10  Difficult doing work/chores Somewhat difficult -   Anhedonia: no Weight changes: no Insomnia: yes hard to fall asleep  Hypersomnia: no Fatigue/loss of energy: no Feelings of worthlessness: no Feelings of guilt: no Impaired concentration/indecisiveness: yes Suicidal ideations: no  Crying spells: no Recent Stressors/Life Changes: no   Relationship problems: no   Family stress: no     Financial stress: no    Job stress: no    Recent death/loss: no  PROSTATE CANCER: Has upcoming appointment for urology to continue treatments.  NICOTINE DEPENDENCE: Smokes about "6 a day on a bad day, less on other days".  Discussed at length cessation.  He wishes to attempt cessation on his own and cutting back.  Relevant past medical, surgical, family and social history reviewed and updated as indicated. Interim medical history since our last visit reviewed. Allergies and medications reviewed and updated.  Review of Systems  Constitutional: Negative for activity change, diaphoresis, fatigue and fever.  Respiratory: Positive for cough (chronic, no worse) and wheezing (no intermittent). Negative for chest tightness and shortness of breath.   Cardiovascular: Negative for chest pain, palpitations and leg swelling.  Gastrointestinal: Negative for abdominal distention, abdominal pain, constipation, diarrhea, nausea and vomiting.  Endocrine: Negative for cold intolerance, heat intolerance, polydipsia, polyphagia and polyuria.  Musculoskeletal: Negative.   Skin:  Negative.   Neurological: Negative for dizziness, syncope, weakness, light-headedness, numbness and headaches.  Psychiatric/Behavioral: Positive for decreased concentration and sleep disturbance. Negative for confusion, self-injury and suicidal ideas. The patient is nervous/anxious.     Per HPI unless specifically indicated above     Objective:    BP (!) 90/50 (BP Location: Left Arm, Patient Position: Sitting, Cuff Size: Normal)   Pulse 78 Comment: apical  Temp 98.1 F (36.7 C) (Oral)   Ht 5\' 7"  (1.702 m)   Wt 113 lb 6.4 oz (51.4 kg)   SpO2 91%   BMI 17.76 kg/m   Wt Readings from Last 3 Encounters:  01/27/18 113 lb 6.4 oz (51.4 kg)  01/25/18 108 lb 6.4 oz (49.2 kg)  01/13/18 107 lb 6.4 oz (48.7 kg)    Physical Exam Vitals signs and nursing note reviewed.  Constitutional:      Appearance: He is well-developed.  HENT:     Head: Normocephalic and atraumatic.     Right Ear: Hearing normal. No drainage.     Left Ear: Hearing normal. No drainage.     Mouth/Throat:     Pharynx: Uvula midline.  Eyes:     General: Lids are normal.        Right eye: No discharge.        Left eye: No discharge.     Conjunctiva/sclera: Conjunctivae normal.     Pupils: Pupils are equal, round, and reactive to light.  Neck:     Musculoskeletal: Normal range of motion and neck supple.     Thyroid: No thyromegaly.     Vascular: No carotid bruit or JVD.     Trachea: Trachea normal.  Cardiovascular:     Rate and Rhythm: Normal rate and regular rhythm.     Heart sounds: Normal heart sounds, S1 normal and S2 normal. No murmur. No gallop.   Pulmonary:     Effort: Pulmonary effort is normal.     Breath sounds: Examination of the right-upper field reveals wheezing. Examination of the left-upper field reveals wheezing. Examination of the right-lower field reveals wheezing. Examination of the left-lower field reveals wheezing. Wheezing present.     Comments: Scattered expiratory wheezes throughout,  diminished.  No rhonchi.   Abdominal:     General: Bowel sounds are normal.     Palpations: Abdomen is soft. There is no hepatomegaly or splenomegaly.  Genitourinary:    Penis: Normal.      Rectum: Normal.  Musculoskeletal: Normal range of motion.  Skin:    General: Skin is warm and dry.     Capillary Refill: Capillary refill takes less than 2 seconds.     Findings: No rash.  Neurological:     Mental Status: He is alert and oriented to person, place, and time.     Deep Tendon Reflexes: Reflexes are normal and symmetric.  Psychiatric:        Mood and Affect: Mood normal.        Behavior: Behavior normal.        Thought Content: Thought content normal.        Judgment: Judgment normal.  Results for orders placed or performed in visit on 01/13/18  Urine drugs of abuse scrn w alc, routine (Ref Lab)  Result Value Ref Range   Amphetamines, Urine Negative Cutoff=1000 ng/mL   Barbiturate Quant, Ur Negative Cutoff=300 ng/mL   Benzodiazepine Quant, Ur See Final Results Cutoff=300 ng/mL   Cannabinoid Quant, Ur Negative Cutoff=50 ng/mL   Cocaine (Metab.) Negative Cutoff=300 ng/mL   Opiate Quant, Ur Negative Cutoff=300 ng/mL   PCP Quant, Ur Negative Cutoff=25 ng/mL   Methadone Screen, Urine Negative Cutoff=300 ng/mL   Propoxyphene Negative Cutoff=300 ng/mL   Ethanol, Urine Negative Cutoff=0.020 %  Drug Profile [id number]  Result Value Ref Range   BENZODIAZEPINES Negative Cutoff=300      Assessment & Plan:   Problem List Items Addressed This Visit      Respiratory   COPD, very severe ([place]) - Primary    Chronic, ongoing.  Is O2 dependent at home.  Currently followed by pulmonology.  Continue to collaborate with pulmonology.      Relevant Medications   albuterol (PROVENTIL) (2.5 MG/3ML) 0.083% nebulizer solution   Ipratropium-Albuterol (COMBIVENT RESPIMAT) 20-100 MCG/ACT AERS respimat   Chronic hypoxemic respiratory failure (HCC)    O2 dependent, with severe COPD.  Continue to  collaborate with pulmonology.        Genitourinary   Prostate cancer [facility])    Collaborate with urology, has upcoming appointment.        Other   Nicotine dependence, cigarettes, uncomplicated    I have recommended complete cessation of tobacco use. I have discussed various options available for assistance with tobacco cessation including over the counter methods (Nicotine gum, patch and lozenges). We also discussed prescription options (Chantix, Nicotine Inhaler / Nasal Spray). The patient is not interested in pursuing any prescription tobacco cessation options at this time.         [place] Narcotic database reviewed for this patient, and feel that the risk/benefit ratio today is favorable for proceeding with a prescription for controlled substance.  No opiate prescriptions in the past year and last benzo refill 12/19/17.  Time: 5 minutes spent counseling on tobacco cessation   Follow up plan: Return in about 3 months (around 04/28/2018) for Physical and Anxiety med refill + COPD.

## 2018-01-27 NOTE — Telephone Encounter (Signed)
Copied from [place] [id number]. Topic: Quick Communication - See Telephone Encounter >> Jan 27, 2018  4:20 PM Bea Graff, NT wrote: CRM for notification. See Telephone encounter for: 01/27/18. Pt states that [facility] will not let him fill his medications due to a insurance issue. He states his medicaid will not be active until 02/18/2018. His insurance stated for him to call [place] to see if there is anything the doctor can do. He states he would like [name] Cannady to call [facility] and let them know he has changed his PCP on his medicaid card but it will not be effect until 21/2020.

## 2018-01-30 ENCOUNTER — Telehealth: Payer: Self-pay | Admitting: Pulmonary Disease

## 2018-01-30 NOTE — Telephone Encounter (Signed)
Patient's medication has been filled.

## 2018-01-30 NOTE — Telephone Encounter (Signed)
ONO through Kevin Nelson was cancelled, per patient. ONO order changed to [place]. Patient aware.

## 2018-02-01 ENCOUNTER — Encounter: Payer: Self-pay | Admitting: Pulmonary Disease

## 2018-02-03 ENCOUNTER — Ambulatory Visit (INDEPENDENT_AMBULATORY_CARE_PROVIDER_SITE_OTHER): Payer: Medicare Other

## 2018-02-03 DIAGNOSIS — J9600 Acute respiratory failure, unspecified whether with hypoxia or hypercapnia: Secondary | ICD-10-CM | POA: Diagnosis not present

## 2018-02-03 DIAGNOSIS — J9611 Chronic respiratory failure with hypoxia: Secondary | ICD-10-CM | POA: Diagnosis not present

## 2018-02-03 NOTE — Progress Notes (Signed)
  SIX MIN WALK 02/03/2018  Medications albuterol neb at 8:00a  Supplimental Oxygen during Test? (L/min) No  Laps 0.5  Partial Lap (in Meters) 0  Baseline BP (sitting) 124/78  Baseline Heartrate 93  Baseline Dyspnea (Borg Scale) 1  Baseline Fatigue (Borg Scale) 1  Baseline SPO2 92  Stopped or Paused before Six Minutes (No Data)  Distance Completed 17  Tech Comments: pt desat to 84% HR 91 after 17 meters- 2L O2 started after half of lap around office pt then desat to 86% O2 increased to 3L and spo2 maintained at 92%.  pt denied sob, dizzines or chest discomfort

## 2018-02-08 ENCOUNTER — Ambulatory Visit: Payer: Medicare Other | Admitting: Urology

## 2018-02-08 ENCOUNTER — Encounter: Payer: Self-pay | Admitting: Urology

## 2018-02-08 ENCOUNTER — Ambulatory Visit (INDEPENDENT_AMBULATORY_CARE_PROVIDER_SITE_OTHER): Payer: Medicare Other | Admitting: Urology

## 2018-02-08 VITALS — BP 138/82 | HR 105 | Ht 67.0 in | Wt 110.4 lb

## 2018-02-08 DIAGNOSIS — C61 Malignant neoplasm of prostate: Secondary | ICD-10-CM | POA: Diagnosis not present

## 2018-02-08 NOTE — Progress Notes (Signed)
02/08/2018 3:43 PM   Kevin Nelson 1950/10/07 [id number]  Referring provider: Marjie Skiff, NP [address] [place], [place] [address]  Chief Complaint  Patient presents with   Prostate Cancer    HPI: Kevin Nelson is a 68 yo M with a history of prostate cancer, COPD and skin cancer who presents today for the evaluation and management of prostate cancer. He was referred to [place] by Marjie Skiff, NP.   Prostate cancer history as below.  Today he has no voiding symptoms.  He does have severe baseline pulmonary issues.  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  Prostate cancer history: On May 02, 2015 he presented from the [facility] clinic for evaluation of a recent elevation of PSA to 78. He has no prior knowledge of prostate cancer nor any family history.    On May 12, 2015, he underwent transrectal ultrasound and biopsy of the prostate. Path report was 4 of 5 biopsies were positive for Gleason grade 3+4 disease. 3 of 4 biopsies on the left were positive for Gleason grade 4+3 disease. Prostate volume was 18 cc at the time. This was performed by Dr. Donnetta Hutching.  He did not have any staging imaging as his disease is felt to be metastatic based on elevated alk phos levels.   On Jun 09, 2015 he started hormone therapy with Lupron and Casodex. On September 2017, PSA was 2.03.  Over the next several years, it appears that he has been on essentially intermittent ADT.  He stopped it 1.  Of time due to severe side effects but his PSA quickly rose again.  His PSA fell again with initiation of treatment.  There is also a period of time were he had difficulty obtaining the medication due to no longer being eligible for care [facility].  It appears that his PSA nadired at 78 as of 06/2017.  At that point Medicaid was approved and he received additional 41-month Lupron injection on 07/13/2017.  On December of 2018 his PSA  rose to 91.2 after he discontinued treatment.    Based on review of records, does not appear that he had any staging imaging.  He had an elevated alkaline phosphatase level this was presumed to have metastatic disease.    PMH: Past Medical History:  Diagnosis Date   Basal cell carcinoma    End stage COPD (HCC)    History of nonmelanoma skin cancer    Prostate cancer (HCC)    Squamous cell skin cancer     Surgical History: Past Surgical History:  Procedure Laterality Date   CHOLECYSTECTOMY     FACIAL LACERATION REPAIR     SKIN CANCER EXCISION      Home Medications:  Allergies as of 02/08/2018   No Known Allergies      Medication List        Accurate as of February 08, 2018  3:43 PM. Always use your most recent med list.          albuterol (2.5 MG/3ML) 0.083% nebulizer solution Commonly known as:  PROVENTIL Take 3 mLs (2.5 mg total) by nebulization every 6 (six) hours as needed for wheezing or shortness of breath.   clonazePAM 0.5 MG tablet Commonly known as:  KLONOPIN Take 1 tablet (0.5 mg total) by mouth 2 (two) times daily as needed for anxiety.   Ipratropium-Albuterol 20-100 MCG/ACT Aers respimat Commonly known as:  COMBIVENT RESPIMAT Inhale 1 puff into the lungs every 6 (six) hours as needed.   leuprolide 30 MG injection Commonly known as:  LUPRON Inject 30 mg into the muscle every 6 (six) months.   umeclidinium-vilanterol 62.5-25 MCG/INH Aepb Commonly known as:  ANORO ELLIPTA Inhale 1 puff into the lungs daily.        Allergies: No Known Allergies  Family History: Family History  Problem Relation Age of Onset   Cancer Sister    Cancer - Cervical Mother    Heart attack Brother    Heart attack Maternal Grandfather     Social History:  reports that he has been smoking cigarettes. He has a 102.00 pack-year smoking history. He has never used smokeless tobacco. He reports current alcohol use of about 3.0 standard drinks of alcohol per week. He  reports that he does not use drugs.  ROS: UROLOGY Frequent Urination?: Yes Hard to postpone urination?: Yes Burning/pain with urination?: Yes Get up at night to urinate?: Yes Leakage of urine?: Yes Urine stream starts and stops?: Yes Trouble starting stream?: Yes Do you have to strain to urinate?: No Blood in urine?: No Urinary tract infection?: No Sexually transmitted disease?: No Injury to kidneys or bladder?: No Painful intercourse?: No Weak stream?: Yes Erection problems?: Yes Penile pain?: Yes  Gastrointestinal Nausea?: No Vomiting?: No Indigestion/heartburn?: No Diarrhea?: No Constipation?: Yes  Constitutional Fever: Yes Night sweats?: Yes Weight loss?: Yes Fatigue?: Yes  Skin Skin rash/lesions?: No Itching?: No  Eyes Blurred vision?: Yes Double vision?: No  Ears/Nose/Throat Sore throat?: Yes Sinus problems?: No  Hematologic/Lymphatic Swollen glands?: No Easy bruising?: Yes  Cardiovascular Leg swelling?: No Chest pain?: Yes  Respiratory Cough?: Yes Shortness of breath?: Yes  Endocrine Excessive thirst?: Yes  Musculoskeletal Back pain?: Yes Joint pain?: Yes  Neurological Headaches?: Yes Dizziness?: No  Psychologic Depression?: Yes Anxiety?: Yes  Physical Exam: BP 138/82 (BP Location: Left Arm, Patient Position: Sitting, Cuff Size: Normal)   Pulse (!) 105   Ht 5\' 7"  (1.702 m)   Wt 110 lb 6.4 oz (50.1 kg)   BMI 17.29 kg/m   Constitutional:  Alert and oriented, No acute distress. HEENT: [place] AT, moist mucus membranes.  Trachea midline, no masses. Cardiovascular: No clubbing, cyanosis, or edema. Respiratory: Normal respiratory effort, no increased work of breathing. Skin: No rashes, bruises or suspicious lesions. Neurologic: Grossly intact, no focal deficits, moving all 4 extremities. Psychiatric: Normal mood and affect.  Assessment & Plan:    1, Carcinoma of prostate PSA-pending  Discussed oral hormonal therapy, Xtandi, along  with the benefits and complications  Pt has financial constraints that prevents him from receiving optimal care  Overall survival and disease progression were discussed  Return in 1 week for Lupron injection and then in 6 months for MD visit  Return in about 1 week (around 02/15/2018) for for Lupron injection and then MD visit in 6 months.  [facility] [address], [address] [place], [place] [address] [phone]  I, Donne Hazel, am acting as a scribe for Dr. Vanna Scotland,

## 2018-02-08 NOTE — Progress Notes (Incomplete)
02/08/2018 8:38 AM   Kevin Nelson 1950-03-27 [id number]  Referring provider: [name], NP [address] [place], [place] [address]  No chief complaint on file.   HPI: Kevin Nelson is a 68 yo M who presents today for the evaluation and management of prostate cancer. He was referred to [place] by [name], NP.   On May 02, 2015 he presented from the [facility] clinic for evaluation of a recent elevation of PSA to 78. He does not have any prior knowledge nor family history of prostate cancer.    PMH: Past Medical History:  Diagnosis Date   Basal cell carcinoma    End stage COPD ([place])    History of nonmelanoma skin cancer    Prostate cancer ([place])    Squamous cell skin cancer     Surgical History: Past Surgical History:  Procedure Laterality Date   CHOLECYSTECTOMY     FACIAL LACERATION REPAIR     SKIN CANCER EXCISION      Home Medications:  Allergies as of 02/08/2018   No Known Allergies     Medication List       Accurate as of February 08, 2018  8:38 AM. Always use your most recent med list.        albuterol (2.5 MG/3ML) 0.083% nebulizer solution Commonly known as:  PROVENTIL Take 3 mLs (2.5 mg total) by nebulization every 6 (six) hours as needed for wheezing or shortness of breath.   clonazePAM 0.5 MG tablet Commonly known as:  KLONOPIN Take 1 tablet (0.5 mg total) by mouth 2 (two) times daily as needed for anxiety.   Ipratropium-Albuterol 20-100 MCG/ACT Aers respimat Commonly known as:  COMBIVENT RESPIMAT Inhale 1 puff into the lungs every 6 (six) hours as needed.   leuprolide 30 MG injection Commonly known as:  LUPRON Inject 30 mg into the muscle every 6 (six) months.   umeclidinium-vilanterol 62.5-25 MCG/INH Aepb Commonly known as:  ANORO ELLIPTA Inhale 1 puff into the lungs daily.       Allergies: No Known Allergies  Family History: Family History  Problem Relation Age of Onset   Cancer Sister    Cancer - Cervical Mother     Heart attack Brother    Heart attack Maternal Grandfather     Social History:  reports that he has been smoking cigarettes. He has a 102.00 pack-year smoking history. He has never used smokeless tobacco. He reports current alcohol use of about 3.0 standard drinks of alcohol per week. He reports that he does not use drugs.  ROS:                                        Physical Exam: There were no vitals taken for this visit.  Constitutional:  Alert and oriented, No acute distress. HEENT: [place] AT, moist mucus membranes.  Trachea midline, no masses. Cardiovascular: No clubbing, cyanosis, or edema. Respiratory: Normal respiratory effort, no increased work of breathing. GI: Abdomen is soft, nontender, nondistended, no abdominal masses GU: No CVA tenderness Lymph: No cervical or inguinal lymphadenopathy. Skin: No rashes, bruises or suspicious lesions. Neurologic: Grossly intact, no focal deficits, moving all 4 extremities. Psychiatric: Normal mood and affect.  Laboratory Data: No results found for: WBC, HGB, HCT, MCV, PLT  No results found for: CREATININE  No results found for: PSA  No results found for: TESTOSTERONE  No results  found for: HGBA1C  Urinalysis No results found for: COLORURINE, APPEARANCEUR, LABSPEC, PHURINE, GLUCOSEU, HGBUR, BILIRUBINUR, KETONESUR, PROTEINUR, UROBILINOGEN, NITRITE, LEUKOCYTESUR  No results found for: LABMICR, [place], RBCUA, LABEPIT, MUCUS, BACTERIA  Pertinent Imaging: *** No results found for this or any previous visit. No results found for this or any previous visit. No results found for this or any previous visit. No results found for this or any previous visit. No results found for this or any previous visit. No results found for this or any previous visit. No results found for this or any previous visit. No results found for this or any previous visit.  Assessment & Plan:    @DIAGMED @  No follow-ups on  file.  [facility] [address], [place], [place] [address] [phone]  I, Lucas Mallow, am acting as a scribe for Dr. Hollice Espy,  {Add Scribe Attestation Statement}

## 2018-02-09 LAB — PSA: Prostate Specific Ag, Serum: 1.2 ng/mL (ref 0.0–4.0)

## 2018-02-14 ENCOUNTER — Ambulatory Visit (HOSPITAL_COMMUNITY): Payer: Medicare Other

## 2018-02-14 ENCOUNTER — Ambulatory Visit
Admission: RE | Admit: 2018-02-14 | Discharge: 2018-02-14 | Disposition: A | Discharge status: Home / Self Care | Payer: Medicare Other | Source: Ambulatory Visit | Attending: Pulmonary Disease | Admitting: Pulmonary Disease

## 2018-02-14 DIAGNOSIS — J449 Chronic obstructive pulmonary disease, unspecified: Secondary | ICD-10-CM

## 2018-02-16 ENCOUNTER — Ambulatory Visit (INDEPENDENT_AMBULATORY_CARE_PROVIDER_SITE_OTHER): Payer: Medicare Other | Admitting: Pulmonary Disease

## 2018-02-16 ENCOUNTER — Encounter: Payer: Self-pay | Admitting: Pulmonary Disease

## 2018-02-16 ENCOUNTER — Ambulatory Visit: Payer: Medicare Other

## 2018-02-16 VITALS — BP 117/73 | HR 109 | Ht 67.0 in | Wt 112.0 lb

## 2018-02-16 VITALS — Resp 16 | Ht 67.0 in | Wt 112.0 lb

## 2018-02-16 DIAGNOSIS — F17200 Nicotine dependence, unspecified, uncomplicated: Secondary | ICD-10-CM

## 2018-02-16 DIAGNOSIS — J449 Chronic obstructive pulmonary disease, unspecified: Secondary | ICD-10-CM | POA: Diagnosis not present

## 2018-02-16 DIAGNOSIS — J9611 Chronic respiratory failure with hypoxia: Secondary | ICD-10-CM | POA: Diagnosis not present

## 2018-02-16 DIAGNOSIS — J439 Emphysema, unspecified: Secondary | ICD-10-CM

## 2018-02-16 DIAGNOSIS — C61 Malignant neoplasm of prostate: Secondary | ICD-10-CM

## 2018-02-16 MED ORDER — LEUPROLIDE ACETATE (6 MONTH) 45 MG IM KIT: 45.0000 mg | PACK | Freq: Once | INTRAMUSCULAR | Status: DC

## 2018-02-16 NOTE — Patient Instructions (Signed)
Smoking cessation as we discussed Continue Stiolto inhaler, 2 actuations daily Continue Combivent inhaler as needed for increased wheezing, chest tightness, shortness of breath, cough We will work with [facility] to get you the best oxygen supplies that we can obtain Continue oxygen 2 LPM by [place] as close to 24 hours/day as possible  Follow-up in 6 months.  Call sooner if needed

## 2018-02-16 NOTE — Progress Notes (Signed)
Lupron IM Injection   Due to Prostate Cancer patient is present today for a Lupron Injection.  Medication: Lupron 6 month Dose: 45 mg  Location: left upper outer buttocks Lot: [id number] Exp: 05/24/2020  Patient tolerated well, no complications were noted  Performed by: Shawnie Dapper, CMA  Follow up: as scheduled

## 2018-02-16 NOTE — Patient Instructions (Addendum)
Please start Vitamin D and Calcium over the counter daily.  Leuprolide depot injection What is this medicine? LEUPROLIDE (loo PROE lide) is a man-made protein that acts like a natural hormone in the body. It decreases testosterone in men and decreases estrogen in women. In men, this medicine is used to treat advanced prostate cancer.  This medicine may be used for other purposes; ask your health care provider or pharmacist if you have questions. COMMON BRAND NAME(S): Eligard, Lupron Depot, Lupron Depot-Ped, Viadur What should I tell my health care provider before I take this medicine? They need to know if you have any of these conditions: -diabetes -heart disease or previous heart attack -high blood pressure -high cholesterol -mental illness -osteoporosis -pain or difficulty passing urine -seizures -spinal cord metastasis -stroke -suicidal thoughts, plans, or attempt; a previous suicide attempt by you or a family member -tobacco smoker -unusual vaginal bleeding (women) -an unusual or allergic reaction to leuprolide, benzyl alcohol, other medicines, foods, dyes, or preservatives -pregnant or trying to get pregnant -breast-feeding How should I use this medicine? This medicine is for injection into a muscle or for injection under the skin. It is given by a health care professional in a hospital or clinic setting. The specific product will determine how it will be given to you. Make sure you understand which product you receive and how often you will receive it. Talk to your pediatrician regarding the use of this medicine in children. Special care may be needed. Overdosage: If you think you have taken too much of this medicine contact a poison control center or emergency room at once. NOTE: This medicine is only for you. Do not share this medicine with others. What if I miss a dose? It is important not to miss a dose. Call your doctor or health care professional if you are unable to keep  an appointment. Depot injections: Depot injections are given either once-monthly, every 12 weeks, every 16 weeks, or every 24 weeks depending on the product you are prescribed. The product you are prescribed will be based on if you are male or male, and your condition. Make sure you understand your product and dosing. What may interact with this medicine? Do not take this medicine with any of the following medications: -chasteberry This medicine may also interact with the following medications: -herbal or dietary supplements, like black cohosh or DHEA -male hormones, like estrogens or progestins and birth control pills, patches, rings, or injections -male hormones, like testosterone This list may not describe all possible interactions. Give your health care provider a list of all the medicines, herbs, non-prescription drugs, or dietary supplements you use. Also tell them if you smoke, drink alcohol, or use illegal drugs. Some items may interact with your medicine. What should I watch for while using this medicine? Visit your doctor or health care professional for regular checks on your progress. During the first weeks of treatment, your symptoms may get worse, but then will improve as you continue your treatment. You may get hot flashes, increased bone pain, increased difficulty passing urine, or an aggravation of nerve symptoms. Discuss these effects with your doctor or health care professional, some of them may improve with continued use of this medicine. Male patients may experience a menstrual cycle or spotting during the first months of therapy with this medicine. If this continues, contact your doctor or health care professional. What side effects may I notice from receiving this medicine? Side effects that you should report to your doctor  or health care professional as soon as possible: -allergic reactions like skin rash, itching or hives, swelling of the face, lips, or tongue -breathing  problems -chest pain -depression or memory disorders -pain in your legs or groin -pain at site where injected or implanted -seizures -severe headache -swelling of the feet and legs -suicidal thoughts or other mood changes -visual changes -vomiting Side effects that usually do not require medical attention (report to your doctor or health care professional if they continue or are bothersome): -breast swelling or tenderness -decrease in sex drive or performance -diarrhea -hot flashes -loss of appetite -muscle, joint, or bone pains -nausea -redness or irritation at site where injected or implanted -skin problems or acne This list may not describe all possible side effects. Call your doctor for medical advice about side effects. You may report side effects to FDA at 1-800-FDA-1088. Where should I keep my medicine? This drug is given in a hospital or clinic and will not be stored at home. NOTE: This sheet is a summary. It may not cover all possible information. If you have questions about this medicine, talk to your doctor, pharmacist, or health care provider.  2019 Elsevier/Gold Standard (2015-06-19 09:45:53)

## 2018-02-20 NOTE — Progress Notes (Signed)
PULMONARY OFFICE FOLLOW-UP NOTE  Requesting MD/Service: Marnee Guarneri, NP Date of initial consultation: 01/25/18 Reason for consultation: Very severe COPD, smoker  PT PROFILE: 68 y.o. male smoker (started age 23, maximum 2 PPD, presently 5 6/day) recently moved to the area with history of oxygen dependent COPD wishes to establish care for COPD/pulmonary issues  DATA: 02/03/18 6MWT: Submaximal effort.  Test terminated after patient reached SPO2 84%. 02/14/18 PFTs: FVC: 2.28 L (56 %pred), FEV1: 0.80 L (25 %pred), FEV1/FVC: 35%, TLC: 7.77 L (121 %pred), DLCO 45 %pred.  Flow volume curve consistent with very severe obstruction.  No significant change after bronchodilator challenge.  INTERVAL: Initial consultation 01/25/2018.  No major pulmonary events since that time.  SUBJ:  This is a scheduled follow-up.  He complains of sore throat because his nocturnal oxygen is not humidified.  He requests a portable oxygen concentrator.  He has no new complaints.  He continues to smoke 3-4 cigarettes/day.  He denies CP, fever, purulent sputum, hemoptysis, LE edema and calf tenderness.  Vitals:   02/16/18 1041  Resp: 16  Weight: 112 lb (50.8 kg)  Height: 5\' 7"  (1.702 m)  Room air   EXAM:  Gen: Very thin, not cachectic, tremulous, appears much older than true age, No overt respiratory distress HEENT: NCAT, sclera white, oropharynx normal Neck: Supple without LAN, thyromegaly JVP not visualized (bearded) Lungs: breath sounds markedly diminished with hyperresonance to percussion.  Scattered, very distant wheezes Cardiovascular: RRR, no murmurs noted Abdomen: Soft, nontender, normal BS Ext: without clubbing, cyanosis, edema Neuro: CNs grossly intact, motor and sensory intact Skin: Limited exam, no lesions noted  DATA:   No flowsheet data found.  No flowsheet data found.  CXR 02/14/18: hyperinflation and hyperlucency consistent with severe emphysema  I have personally reviewed all chest  radiographs reported above including CXRs and CT chest unless otherwise indicated  IMPRESSION:     ICD-10-CM   1. Chronic hypoxemic respiratory failure ([place]) J96.11 Ambulatory Referral for DME  2. COPD, very severe ([place]) J44.9 Ambulatory Referral for DME  3. Very severe emphysema J43.9 Ambulatory Referral for DME  4. Smoker F17.200      PLAN:  We again discussed the importance of complete abstinence from cigarette smoking.  I recommended a trial of nicotine replacement therapy with either lozenges or gum.  Continue Anoro inhaler, 1 inhalation daily  Continue Combivent inhaler as needed for increased shortness of breath, wheezing  We will work with [facility] to get him humidified oxygen at night and a portable oxygen concentrator  I have recommended they continue oxygen therapy at 2 LPM by [place] as close to 24 hours/day as possible  Follow-up in 6 months.  Call sooner if needed   Merton Border, MD PCCM service Mobile [phone] Pager [phone] 02/20/2018 12:56 PM

## 2018-03-15 DIAGNOSIS — C61 Malignant neoplasm of prostate: Secondary | ICD-10-CM | POA: Diagnosis not present

## 2018-03-15 DIAGNOSIS — J449 Chronic obstructive pulmonary disease, unspecified: Secondary | ICD-10-CM | POA: Diagnosis not present

## 2018-03-21 ENCOUNTER — Telehealth: Payer: Self-pay | Admitting: Nurse Practitioner

## 2018-03-21 NOTE — Telephone Encounter (Signed)
Copied from [place] [id number]. Topic: General - Inquiry >> Mar 17, 2018  3:24 PM Virl Axe D wrote: Reason for CRM: Pt called and stated that [facility] keeps sending him a bill for his urinalysis and will not resubmit to Medicaid. He wants to know if there is anyway that someone in office can contact [facility] and ask them to resubmit the claim to his Medicaid. He has his corrected cards. Please advise. He stated he talked about this with the front desk and Tarea Skillman at his last OV. Please advise.  Pt also asking if Saul Dorsi can recommend a dentist for him as well. >> Mar 17, 2018  4:47 PM Shaune Pollack wrote: Spoke with patient regarding his [facility] situation. Gave him the billing dept for [facility].    Addaline Peplinski patient wanted me to make sure I sent you the message from him regarding your recommendation for a dentist please.  Thank You

## 2018-03-21 NOTE — Telephone Encounter (Signed)
Spoke with patient. Gave him the information regarding the dentist.

## 2018-03-27 ENCOUNTER — Telehealth: Payer: Self-pay | Admitting: Urology

## 2018-03-27 NOTE — Telephone Encounter (Signed)
NO PA IS REQUIRED FOR LUPRON REF# [id number] 03-27-18 MICHELLE

## 2018-04-03 ENCOUNTER — Ambulatory Visit: Payer: Self-pay | Admitting: Nurse Practitioner

## 2018-04-03 NOTE — Telephone Encounter (Signed)
I returned call to pt.   He has COPD and his sister said he should be be tested for the coronavirus even though he has no exposure.    I am taking treatments for prostate CA.   It causes me to have hot flashes.   But no fever.   No shortness of breath or coughing that is different for him.   When he tries to clear his throat sometimes he has difficulty clearing his throat.  He does not meet any of the coronavirus criteria for testing.   He agrees he does not really need to be tested at this time.     Reason for Disposition . [1] No COVID-19 EXPOSURE BUT [2] questions about  Answer Assessment - Initial Assessment Questions 1. PLACE of CONTACT: "Where were you when you were exposed to COVID-19  (coronavirus disease 2019)?" (e.g., city, state, country)     I haven't been exposed to anyone with the coronavirus.   My sister who is in medicine said I should be tested because I have COPD. 2. TYPE of CONTACT: "How much contact was there?" (e.g., live in same house, work in same office, same school)     No contacts 3. DATE of CONTACT: "When did you have contact with a coronavirus patient?" (e.g., days)     I didn't have contact with anyone with it. 4. DURATION of CONTACT: "How long were you in contact with the COVID-19 (coronavirus disease) patient?" (e.g., a few seconds, passed by person, a few minutes, live with the patient)     N/A 5. SYMPTOMS: "Do you have any symptoms?" (e.g., fever, cough, breathing difficulty)     No.   He denies fever, cough, shortness of breath or feeling bad at all. 6. PREGNANCY OR POSTPARTUM: "Is there any chance you are pregnant?" "When was your last menstrual period?" "Did you deliver in the last 2 weeks?"     N/A 7. HIGH RISK: "Do you have any heart or lung problems? Do you have a weakened immune system?" (e.g., CHF, COPD, asthma, HIV positive, chemotherapy, renal failure, diabetes mellitus, sickle cell anemia)     I have COPD and am getting treatment for prostate CA so my  immune system is down.  Protocols used: CORONAVIRUS (COVID-19) EXPOSURE-A-AH

## 2018-04-14 ENCOUNTER — Emergency Department: Payer: Medicare Other

## 2018-04-14 ENCOUNTER — Encounter: Payer: Self-pay | Admitting: Emergency Medicine

## 2018-04-14 ENCOUNTER — Inpatient Hospital Stay
Admission: EM | Admit: 2018-04-14 | Discharge: 2018-04-16 | DRG: 190 | Disposition: A | Discharge status: Home / Self Care | Payer: Medicare Other | Attending: Internal Medicine | Admitting: Internal Medicine

## 2018-04-14 DIAGNOSIS — Z79899 Other long term (current) drug therapy: Secondary | ICD-10-CM

## 2018-04-14 DIAGNOSIS — Z85828 Personal history of other malignant neoplasm of skin: Secondary | ICD-10-CM

## 2018-04-14 DIAGNOSIS — R071 Chest pain on breathing: Secondary | ICD-10-CM | POA: Diagnosis not present

## 2018-04-14 DIAGNOSIS — Z8546 Personal history of malignant neoplasm of prostate: Secondary | ICD-10-CM

## 2018-04-14 DIAGNOSIS — J9811 Atelectasis: Secondary | ICD-10-CM | POA: Diagnosis present

## 2018-04-14 DIAGNOSIS — J9621 Acute and chronic respiratory failure with hypoxia: Secondary | ICD-10-CM | POA: Diagnosis present

## 2018-04-14 DIAGNOSIS — Z9049 Acquired absence of other specified parts of digestive tract: Secondary | ICD-10-CM

## 2018-04-14 DIAGNOSIS — F411 Generalized anxiety disorder: Secondary | ICD-10-CM | POA: Diagnosis present

## 2018-04-14 DIAGNOSIS — R0689 Other abnormalities of breathing: Secondary | ICD-10-CM | POA: Diagnosis not present

## 2018-04-14 DIAGNOSIS — Z8249 Family history of ischemic heart disease and other diseases of the circulatory system: Secondary | ICD-10-CM

## 2018-04-14 DIAGNOSIS — Z9981 Dependence on supplemental oxygen: Secondary | ICD-10-CM

## 2018-04-14 DIAGNOSIS — J439 Emphysema, unspecified: Principal | ICD-10-CM | POA: Diagnosis present

## 2018-04-14 DIAGNOSIS — J209 Acute bronchitis, unspecified: Secondary | ICD-10-CM | POA: Diagnosis present

## 2018-04-14 DIAGNOSIS — R0789 Other chest pain: Secondary | ICD-10-CM | POA: Diagnosis not present

## 2018-04-14 DIAGNOSIS — R0602 Shortness of breath: Secondary | ICD-10-CM | POA: Diagnosis not present

## 2018-04-14 DIAGNOSIS — R079 Chest pain, unspecified: Secondary | ICD-10-CM | POA: Diagnosis not present

## 2018-04-14 DIAGNOSIS — Z7951 Long term (current) use of inhaled steroids: Secondary | ICD-10-CM

## 2018-04-14 DIAGNOSIS — Z716 Tobacco abuse counseling: Secondary | ICD-10-CM

## 2018-04-14 DIAGNOSIS — F17210 Nicotine dependence, cigarettes, uncomplicated: Secondary | ICD-10-CM | POA: Diagnosis present

## 2018-04-14 DIAGNOSIS — J441 Chronic obstructive pulmonary disease with (acute) exacerbation: Secondary | ICD-10-CM | POA: Diagnosis present

## 2018-04-14 DIAGNOSIS — R069 Unspecified abnormalities of breathing: Secondary | ICD-10-CM | POA: Diagnosis not present

## 2018-04-14 LAB — CBC WITH DIFFERENTIAL/PLATELET
Abs Immature Granulocytes: 0.04 10*3/uL (ref 0.00–0.07)
Basophils Absolute: 0.1 10*3/uL (ref 0.0–0.1)
Basophils Relative: 1 %
Eosinophils Absolute: 0.2 10*3/uL (ref 0.0–0.5)
Eosinophils Relative: 2 %
HCT: 40.2 % (ref 39.0–52.0)
Hemoglobin: 12.9 g/dL — ABNORMAL LOW (ref 13.0–17.0)
Immature Granulocytes: 0 %
Lymphocytes Relative: 14 %
Lymphs Abs: 1.6 10*3/uL (ref 0.7–4.0)
MCH: 29.1 pg (ref 26.0–34.0)
MCHC: 32.1 g/dL (ref 30.0–36.0)
MCV: 90.5 fL (ref 80.0–100.0)
Monocytes Absolute: 1.3 10*3/uL — ABNORMAL HIGH (ref 0.1–1.0)
Monocytes Relative: 11 %
Neutro Abs: 8.5 10*3/uL — ABNORMAL HIGH (ref 1.7–7.7)
Neutrophils Relative %: 72 %
Platelets: 333 10*3/uL (ref 150–400)
RBC: 4.44 MIL/uL (ref 4.22–5.81)
RDW: 12.9 % (ref 11.5–15.5)
WBC: 11.7 10*3/uL — ABNORMAL HIGH (ref 4.0–10.5)
nRBC: 0 % (ref 0.0–0.2)

## 2018-04-14 LAB — LACTIC ACID, PLASMA: Lactic Acid, Venous: 0.8 mmol/L (ref 0.5–1.9)

## 2018-04-14 LAB — BASIC METABOLIC PANEL
Anion gap: 9 (ref 5–15)
BUN: 9 mg/dL (ref 8–23)
CO2: 33 mmol/L — ABNORMAL HIGH (ref 22–32)
Calcium: 8.9 mg/dL (ref 8.9–10.3)
Chloride: 95 mmol/L — ABNORMAL LOW (ref 98–111)
Creatinine, Ser: 0.49 mg/dL — ABNORMAL LOW (ref 0.61–1.24)
GFR calc Af Amer: >60 mL/min (ref >60)
GFR calc non Af Amer: >60 mL/min (ref >60)
Glucose, Bld: 110 mg/dL — ABNORMAL HIGH (ref 70–99)
Potassium: 4 mmol/L (ref 3.5–5.1)
Sodium: 137 mmol/L (ref 135–145)

## 2018-04-14 LAB — TROPONIN I: Troponin I: <0.03 ng/mL (ref <0.03)

## 2018-04-14 MED ORDER — ASPIRIN 81 MG PO CHEW
324.0000 mg | CHEWABLE_TABLET | Freq: Once | ORAL | Status: AC
  Administered 2018-04-14: 324 mg via ORAL
  Filled 2018-04-14: qty 4

## 2018-04-14 MED ORDER — AZITHROMYCIN 500 MG PO TABS
500.0000 mg | ORAL_TABLET | Freq: Once | ORAL | Status: AC
  Administered 2018-04-15: 500 mg via ORAL
  Filled 2018-04-14: qty 1

## 2018-04-14 MED ORDER — IPRATROPIUM-ALBUTEROL 0.5-2.5 (3) MG/3ML IN SOLN
3.0000 mL | Freq: Once | RESPIRATORY_TRACT | Status: AC
  Administered 2018-04-14: 3 mL via RESPIRATORY_TRACT
  Filled 2018-04-14: qty 6

## 2018-04-14 MED ORDER — IOHEXOL 350 MG/ML SOLN
75.0000 mL | Freq: Once | INTRAVENOUS | Status: AC | PRN
  Administered 2018-04-14: 75 mL via INTRAVENOUS

## 2018-04-14 MED ORDER — IPRATROPIUM-ALBUTEROL 0.5-2.5 (3) MG/3ML IN SOLN
3.0000 mL | Freq: Once | RESPIRATORY_TRACT | Status: AC
  Administered 2018-04-14: 3 mL via RESPIRATORY_TRACT

## 2018-04-14 MED ORDER — MORPHINE SULFATE (PF) 4 MG/ML IV SOLN
4.0000 mg | Freq: Once | INTRAVENOUS | Status: AC
  Administered 2018-04-14: 4 mg via INTRAVENOUS
  Filled 2018-04-14: qty 1

## 2018-04-14 NOTE — ED Provider Notes (Signed)
[facility] Emergency Department Provider Note   ____________________________________________   First MD Initiated Contact with Patient 04/14/18 2142     (approximate)  I have reviewed the triage vital signs and the nursing notes.   HISTORY  Chief Complaint Shortness of Breath    HPI Kevin Nelson is a 68 y.o. male here for evaluation of shortness of breath   Patient reports he has been trying to stay at home and isolating himself away from any disease for the last couple weeks due to coronavirus.  He started to have increasing shortness of breath and wheezing.  He has severe COPD, reports he started to have shortness of breath once again over the last few days.  A dry nonproductive cough and wheezing.  No noted fever at home.  Has recently been hospitalized at [facility] where he reports he was treated for pneumonia recently also has cancer of the prostate  He reports for a couple of days now has had a sharp chest pain  as well with deep inspiration  Has been trying not to come to the hospital, but despite that treatments at home he continues to feel worse.  He denies runny nose, muscle aches, or contact anyone known to have coronavirus.  Reports he is actually been sheltering away from trying to avoid contact with anyone for a couple weeks  Past Medical History:  Diagnosis Date  . Basal cell carcinoma   . End stage COPD ([place])   . History of nonmelanoma skin cancer   . Prostate cancer ([place])   . Squamous cell skin cancer     Patient Active Problem List   Diagnosis Date Noted  . Nicotine dependence, cigarettes, uncomplicated 56/43/3295  . Controlled substance agreement signed 01/27/2018  . Chronic hypoxemic respiratory failure ([place]) 01/26/2018  . COPD, very severe ([place]) 01/13/2018  . Prostate cancer ([place]) 01/13/2018  . History of basal cell carcinoma (BCC) 01/13/2018  . Generalized anxiety disorder 01/13/2018    Past Surgical History:   Procedure Laterality Date  . CHOLECYSTECTOMY    . FACIAL LACERATION REPAIR    . SKIN CANCER EXCISION      Prior to Admission medications   Medication Sig Start Date End Date Taking? Authorizing Provider  albuterol (PROVENTIL) (2.5 MG/3ML) 0.083% nebulizer solution Take 3 mLs (2.5 mg total) by nebulization every 6 (six) hours as needed for wheezing or shortness of breath. 01/27/18   Cannady, Henrine Screws T, NP  clonazePAM (KLONOPIN) 0.5 MG tablet Take 1 tablet (0.5 mg total) by mouth 2 (two) times daily as needed for anxiety. 01/27/18   Cannady, Henrine Screws T, NP  Ipratropium-Albuterol (COMBIVENT RESPIMAT) 20-100 MCG/ACT AERS respimat Inhale 1 puff into the lungs every 6 (six) hours as needed. 01/27/18   Cannady, Henrine Screws T, NP  leuprolide (LUPRON) 30 MG injection Inject 30 mg into the muscle every 6 (six) months.    [provider]  umeclidinium-vilanterol (ANORO ELLIPTA) 62.5-25 MCG/INH AEPB Inhale 1 puff into the lungs daily. 01/27/18   Wilhelmina Mcardle, MD    Allergies Patient has no known allergies.  Family History  Problem Relation Age of Onset  . Cancer Sister   . Cancer - Cervical Mother   . Heart attack Brother   . Heart attack Maternal Grandfather     Social History Social History   Tobacco Use  . Smoking status: Current Every Day Smoker    Packs/day: 2.00    Years: 51.00    Pack years: 102.00  Types: Cigarettes  . Smokeless tobacco: Never Used  Substance Use Topics  . Alcohol use: Yes    Alcohol/week: 3.0 standard drinks    Types: 3 Cans of beer per week  . Drug use: Never    Review of Systems Constitutional: No fever/chills Eyes: No visual changes. ENT: No sore throat. Cardiovascular: Sharp chest pain with deep breathing present for about 2 days Respiratory: Rawness of breath improving some with treatments by EMS.   Gastrointestinal: No abdominal pain.   Genitourinary: Negative for dysuria. Musculoskeletal: Negative for back pain. Skin: Negative for rash.  Neurological: Negative for headaches, areas of focal weakness or numbness.  Patient was given Solu-Medrol 125 by EMS as well as nebulizer treatment  ____________________________________________   PHYSICAL EXAM:  VITAL SIGNS: ED Triage Vitals  Enc Vitals Group     BP 04/14/18 2149 (!) 154/78     Pulse Rate 04/14/18 2149 (!) 110     Resp 04/14/18 2149 (!) 26     Temp 04/14/18 2149 98.6 F (37 C)     Temp Source 04/14/18 2149 Oral     SpO2 04/14/18 2142 96 %     Weight 04/14/18 2150 120 lb (54.4 kg)     Height 04/14/18 2150 5\' 8"  (1.727 m)     Head Circumference --      Peak Flow --      Pain Score 04/14/18 2149 10     Pain Loc --      Pain Edu? --      Excl. in [place]? --     Constitutional: Alert and oriented.  Mild increased work of breathing.  Alert and oriented. Eyes: Conjunctivae are normal. Head: Atraumatic. Nose: No congestion/rhinnorhea. Mouth/Throat: Mucous membranes are moist. Neck: No stridor.  Cardiovascular: Slightly tachycardic rate, regular rhythm. Grossly normal heart sounds.  Good peripheral circulation. Respiratory: Mild tachypnea.  Audible wheezing with expiration.  Mild use of accessory muscles.  Some splinting reports sharp chest pain with deep inspiration. Gastrointestinal: Soft and nontender. No distention. Musculoskeletal: No lower extremity tenderness nor edema. Neurologic:  Normal speech and language. No gross focal neurologic deficits are appreciated.  Skin:  Skin is warm, dry and intact. No rash noted. Psychiatric: Mood and affect are normal. Speech and behavior are normal.  ____________________________________________   LABS (all labs ordered are listed, but only abnormal results are displayed)  Labs Reviewed  CBC WITH DIFFERENTIAL/PLATELET - Abnormal; Notable for the following components:      Result Value   WBC 11.7 (*)    Hemoglobin 12.9 (*)    Neutro Abs 8.5 (*)    Monocytes Absolute 1.3 (*)    All other components within normal  limits  CULTURE, BLOOD (ROUTINE X 2)  CULTURE, BLOOD (ROUTINE X 2)  BASIC METABOLIC PANEL  TROPONIN I  LACTIC ACID, PLASMA   ____________________________________________  EKG  Reviewed entered by me at 2147 Heart rate 110 QRS 100 QTc 440 Sinus tachycardia, no obvious ischemic abnormality.  Slight J-point elevation noted in multiple leads. ____________________________________________  RADIOLOGY  Dg Chest Port 1 View  Result Date: 04/14/2018 CLINICAL DATA:  Increased shortness of breath today. History of COPD. EXAM: PORTABLE CHEST 1 VIEW COMPARISON:  02/14/2018 FINDINGS: Chronic hyperinflation. The cardiomediastinal contours are normal. Mild central bronchial thickening. Pulmonary vasculature is normal. No consolidation, pleural effusion, or pneumothorax. No acute osseous abnormalities are seen. IMPRESSION: Chronic hyperinflation with mild central bronchial thickening, imaging findings consistent with COPD. No localizing pulmonary process. Electronically Signed   By:  Keith Rake M.D.   On: 04/14/2018 22:25    Chest x-ray reviewed, hyperinflated. ____________________________________________   PROCEDURES  Procedure(s) performed: None  Procedures  Critical Care performed: No  ____________________________________________   INITIAL IMPRESSION / ASSESSMENT AND PLAN / ED COURSE  Pertinent labs & imaging results that were available during my care of the patient were reviewed by me and considered in my medical decision making (see chart for details).   Patient presents for shortness of breath, wheezing.  Patient reports feels like "COPD".  Is available needed work of breathing.  Severe COPD at baseline with recent pneumonia.  Clinical Course as of Apr 14 2311  Fri Apr 14, 2018  2244 Patient does report his breathing started improved some.  Currently having labs completed, appears to be resting slightly more comfortably at this time.   [MQ]    Clinical Course User Index  [MQ] Delman Kitten, MD   Feel the patient is very low risk for coronavirus, has been sheltering himself he is afebrile.  He does have a strong history of COPD and is wheezing, suspect this is a COPD exacerbation also consider pneumonia.  Possible pleurisy.  Does have pleuritic chest pain with deep inspiration, also consider pericarditis though overall suspect is likely pleurisy, COPD exacerbation.  Will exclude pulmonary embolism given his recent hospitalizations and pleuritic pain.  CT angios chest ordered.  Morphine for pain, albuterol nebulizers, chest x-ray.  EKG does not demonstrate evidence of acute ischemia, some slight J-point elevation is diffuse query will feel unlikely pericarditis but could be  ----------------------------------------- 11:13 PM on 04/14/2018 -----------------------------------------  Ongoing care assigned to Dr. Lurline Hare.  Follow-up on remaining lab tests, reevaluation after completion of additional neb therapy, and CT chest to exclude PE and further evaluate for pneumonia.  Based on the patient's increased work of breathing I would anticipate a need for admission once further work-up has been complete.  ____________________________________________   FINAL CLINICAL IMPRESSION(S) / ED DIAGNOSES  Final diagnoses:  COPD with acute exacerbation ([place])        Note:  This document was prepared using Dragon voice recognition software and may include unintentional dictation errors       Delman Kitten, MD 04/14/18 2313

## 2018-04-14 NOTE — ED Notes (Signed)
Pt to CT

## 2018-04-14 NOTE — ED Triage Notes (Signed)
Pt to ED by EMS with c/o of SOB that increased today. Pt has hx of COPD. Pt has been under self quarantine for at least 2 weeks and had no know expose to COVID 19. Pt is currently receiving treatment for prostate cancer.

## 2018-04-14 NOTE — ED Provider Notes (Signed)
-----------------------------------------   11:54 PM on 04/14/2018 -----------------------------------------  ED interpretation of CT result: No PE  CTA chest interpreted per Dr. Joelyn Oms:  1. No pulmonary embolus.  2. Emphysema with bronchial thickening. Retained mucus in the  dependent trachea.  3. Prominent bilateral hilar lymph nodes are likely reactive.  4. 5 mm nodular focus in the dependent left lower lobe is  indeterminate for nodule versus atelectasis. Given smoking history  recommend follow-up non-contrast CT in 12 months.   Due to patient's increased work of breathing requiring several nebulizer treatments, will discuss with hospitalist Dr. Floreen Comber to evaluate patient in the emergency department for admission.   Paulette Blanch, MD 04/15/18 [phone]

## 2018-04-15 ENCOUNTER — Other Ambulatory Visit: Payer: Self-pay

## 2018-04-15 ENCOUNTER — Encounter: Payer: Self-pay | Admitting: *Deleted

## 2018-04-15 DIAGNOSIS — J9601 Acute respiratory failure with hypoxia: Secondary | ICD-10-CM | POA: Diagnosis not present

## 2018-04-15 DIAGNOSIS — Z7951 Long term (current) use of inhaled steroids: Secondary | ICD-10-CM | POA: Diagnosis not present

## 2018-04-15 DIAGNOSIS — Z85828 Personal history of other malignant neoplasm of skin: Secondary | ICD-10-CM | POA: Diagnosis not present

## 2018-04-15 DIAGNOSIS — J441 Chronic obstructive pulmonary disease with (acute) exacerbation: Secondary | ICD-10-CM | POA: Diagnosis present

## 2018-04-15 DIAGNOSIS — F17200 Nicotine dependence, unspecified, uncomplicated: Secondary | ICD-10-CM | POA: Diagnosis not present

## 2018-04-15 DIAGNOSIS — F17210 Nicotine dependence, cigarettes, uncomplicated: Secondary | ICD-10-CM | POA: Diagnosis present

## 2018-04-15 DIAGNOSIS — Z8249 Family history of ischemic heart disease and other diseases of the circulatory system: Secondary | ICD-10-CM | POA: Diagnosis not present

## 2018-04-15 DIAGNOSIS — Z9981 Dependence on supplemental oxygen: Secondary | ICD-10-CM | POA: Diagnosis not present

## 2018-04-15 DIAGNOSIS — C61 Malignant neoplasm of prostate: Secondary | ICD-10-CM | POA: Diagnosis not present

## 2018-04-15 DIAGNOSIS — J9621 Acute and chronic respiratory failure with hypoxia: Secondary | ICD-10-CM | POA: Diagnosis present

## 2018-04-15 DIAGNOSIS — Z8546 Personal history of malignant neoplasm of prostate: Secondary | ICD-10-CM | POA: Diagnosis not present

## 2018-04-15 DIAGNOSIS — J439 Emphysema, unspecified: Secondary | ICD-10-CM | POA: Diagnosis present

## 2018-04-15 DIAGNOSIS — F411 Generalized anxiety disorder: Secondary | ICD-10-CM | POA: Diagnosis present

## 2018-04-15 DIAGNOSIS — Z79899 Other long term (current) drug therapy: Secondary | ICD-10-CM | POA: Diagnosis not present

## 2018-04-15 DIAGNOSIS — J209 Acute bronchitis, unspecified: Secondary | ICD-10-CM | POA: Diagnosis present

## 2018-04-15 DIAGNOSIS — Z9049 Acquired absence of other specified parts of digestive tract: Secondary | ICD-10-CM | POA: Diagnosis not present

## 2018-04-15 DIAGNOSIS — Z716 Tobacco abuse counseling: Secondary | ICD-10-CM | POA: Diagnosis not present

## 2018-04-15 DIAGNOSIS — J9811 Atelectasis: Secondary | ICD-10-CM | POA: Diagnosis present

## 2018-04-15 LAB — PROCALCITONIN: Procalcitonin: <0.1 ng/mL

## 2018-04-15 MED ORDER — ALBUTEROL SULFATE (2.5 MG/3ML) 0.083% IN NEBU
2.5000 mg | INHALATION_SOLUTION | RESPIRATORY_TRACT | Status: DC | PRN
  Administered 2018-04-15: 08:00:00 2.5 mg via RESPIRATORY_TRACT
  Filled 2018-04-15: qty 3

## 2018-04-15 MED ORDER — GUAIFENESIN-DM 100-10 MG/5ML PO SYRP: 5.0000 mL | ORAL_SOLUTION | ORAL | Status: DC | PRN

## 2018-04-15 MED ORDER — DOCUSATE SODIUM 100 MG PO CAPS
100.0000 mg | ORAL_CAPSULE | Freq: Two times a day (BID) | ORAL | Status: DC | PRN
  Filled 2018-04-15: qty 1

## 2018-04-15 MED ORDER — PREDNISONE 20 MG PO TABS: 40.0000 mg | ORAL_TABLET | Freq: Every day | ORAL | Status: DC

## 2018-04-15 MED ORDER — BENZONATATE 100 MG PO CAPS
200.0000 mg | ORAL_CAPSULE | Freq: Three times a day (TID) | ORAL | Status: DC
  Administered 2018-04-15 – 2018-04-16 (×4): 200 mg via ORAL
  Filled 2018-04-15 (×4): qty 2

## 2018-04-15 MED ORDER — SODIUM CHLORIDE 0.9 % IV SOLN
500.0000 mg | INTRAVENOUS | Status: DC
  Administered 2018-04-16: 10:00:00 500 mg via INTRAVENOUS
  Filled 2018-04-15 (×2): qty 500

## 2018-04-15 MED ORDER — ENOXAPARIN SODIUM 40 MG/0.4ML ~~LOC~~ SOLN: 40.0000 mg | SUBCUTANEOUS | Status: DC

## 2018-04-15 MED ORDER — METHYLPREDNISOLONE SODIUM SUCC 40 MG IJ SOLR
40.0000 mg | Freq: Three times a day (TID) | INTRAMUSCULAR | Status: DC
  Administered 2018-04-15 – 2018-04-16 (×3): 40 mg via INTRAVENOUS
  Filled 2018-04-15 (×3): qty 1

## 2018-04-15 MED ORDER — METHYLPREDNISOLONE SODIUM SUCC 125 MG IJ SOLR
60.0000 mg | Freq: Four times a day (QID) | INTRAMUSCULAR | Status: DC
  Administered 2018-04-15: 04:00:00 60 mg via INTRAVENOUS
  Filled 2018-04-15: qty 2

## 2018-04-15 MED ORDER — ENOXAPARIN SODIUM 40 MG/0.4ML ~~LOC~~ SOLN
40.0000 mg | SUBCUTANEOUS | Status: DC
  Filled 2018-04-15: qty 0.4

## 2018-04-15 MED ORDER — NICOTINE 21 MG/24HR TD PT24
21.0000 mg | MEDICATED_PATCH | Freq: Every day | TRANSDERMAL | Status: DC
  Administered 2018-04-15 – 2018-04-16 (×2): 21 mg via TRANSDERMAL
  Filled 2018-04-15 (×2): qty 1

## 2018-04-15 MED ORDER — CLONAZEPAM 0.5 MG PO TABS
0.5000 mg | ORAL_TABLET | Freq: Two times a day (BID) | ORAL | Status: DC | PRN
  Administered 2018-04-15 – 2018-04-16 (×2): 0.5 mg via ORAL
  Filled 2018-04-15 (×2): qty 1

## 2018-04-15 MED ORDER — IPRATROPIUM-ALBUTEROL 0.5-2.5 (3) MG/3ML IN SOLN
3.0000 mL | Freq: Four times a day (QID) | RESPIRATORY_TRACT | Status: DC
  Administered 2018-04-15 – 2018-04-16 (×5): 3 mL via RESPIRATORY_TRACT
  Filled 2018-04-15 (×5): qty 3

## 2018-04-15 MED ORDER — AZITHROMYCIN 500 MG PO TABS: 500.0000 mg | ORAL_TABLET | Freq: Every day | ORAL | Status: DC

## 2018-04-15 MED ORDER — SODIUM CHLORIDE 0.9 % IV SOLN: 500.0000 mg | INTRAVENOUS | Status: DC

## 2018-04-15 MED ORDER — ENOXAPARIN SODIUM 40 MG/0.4ML ~~LOC~~ SOLN
40.0000 mg | SUBCUTANEOUS | Status: DC
  Administered 2018-04-15: 07:00:00 40 mg via SUBCUTANEOUS
  Filled 2018-04-15: qty 0.4

## 2018-04-15 NOTE — ED Notes (Signed)
ED TO INPATIENT HANDOFF REPORT  ED Nurse Name and Phone #: Eyvonne Mechanic [phone]  S Name/Age/Gender Kevin Nelson 68 y.o. male Room/Bed: ED01A/ED01A  Code Status   Code Status: Not on file  Home/SNF/Other Home Patient oriented to: self, place, time and situation Is this baseline? Yes   Triage Complete: Triage complete  Chief Complaint sob  Triage Note Pt to ED by EMS with c/o of SOB that increased today. Pt has hx of COPD. Pt has been under self quarantine for at least 2 weeks and had no know expose to COVID 19. Pt is currently receiving treatment for prostate cancer.     Allergies No Known Allergies  Level of Care/Admitting Diagnosis ED Disposition    ED Disposition Condition [facility] [facility]: [place] [[id number]]  Level of Care: Med-Surg [16]  Diagnosis: COPD exacerbation [facility] [[id number]]  Admitting Physician: Marygrace Drought [[id number]]  Attending Physician: Marygrace Drought [[id number]]  Estimated length of stay: past midnight tomorrow  Certification:: I certify this patient will need inpatient services for at least 2 midnights  PT Class (Do Not Modify): Inpatient [101]  PT Acc Code (Do Not Modify): Private [1]       B Medical/Surgery History Past Medical History:  Diagnosis Date  . Basal cell carcinoma   . End stage COPD ([place])   . History of nonmelanoma skin cancer   . Prostate cancer ([place])   . Squamous cell skin cancer    Past Surgical History:  Procedure Laterality Date  . CHOLECYSTECTOMY    . FACIAL LACERATION REPAIR    . SKIN CANCER EXCISION       A IV Location/Drains/Wounds Patient Lines/Drains/Airways Status   Active Line/Drains/Airways    Name:   Placement date:   Placement time:   Site:   Days:   Peripheral IV 04/14/18 Left Hand   04/14/18    -    Hand   1   Peripheral IV 04/14/18 Right Forearm   04/14/18    2240    Forearm   1          Intake/Output Last 24 hours No intake or output data in the 24 hours ending  04/15/18 0120  Labs/Imaging Results for orders placed or performed during the hospital encounter of 04/14/18 (from the past 48 hour(s))  CBC with Differential     Status: Abnormal   Collection Time: 04/14/18 10:42 PM  Result Value Ref Range   WBC 11.7 (H) 4.0 - 10.5 K/uL   RBC 4.44 4.22 - 5.81 MIL/uL   Hemoglobin 12.9 (L) 13.0 - 17.0 g/dL   HCT 40.2 39.0 - 52.0 %   MCV 90.5 80.0 - 100.0 fL   MCH 29.1 26.0 - 34.0 pg   MCHC 32.1 30.0 - 36.0 g/dL   RDW 12.9 11.5 - 15.5 %   Platelets 333 150 - 400 K/uL   nRBC 0.0 0.0 - 0.2 %   Neutrophils Relative % 72 %   Neutro Abs 8.5 (H) 1.7 - 7.7 K/uL   Lymphocytes Relative 14 %   Lymphs Abs 1.6 0.7 - 4.0 K/uL   Monocytes Relative 11 %   Monocytes Absolute 1.3 (H) 0.1 - 1.0 K/uL   Eosinophils Relative 2 %   Eosinophils Absolute 0.2 0.0 - 0.5 K/uL   Basophils Relative 1 %   Basophils Absolute 0.1 0.0 - 0.1 K/uL   Immature Granulocytes 0 %   Abs Immature Granulocytes 0.04 0.00 - 0.07 K/uL  Comment: Performed at [facility], [address] [place], [place] [address]  Basic metabolic panel     Status: Abnormal   Collection Time: 04/14/18 10:42 PM  Result Value Ref Range   Sodium 137 135 - 145 mmol/L   Potassium 4.0 3.5 - 5.1 mmol/L   Chloride 95 (L) 98 - 111 mmol/L   CO2 33 (H) 22 - 32 mmol/L   Glucose, Bld 110 (H) 70 - 99 mg/dL   BUN 9 8 - 23 mg/dL   Creatinine, Ser 0.49 (L) 0.61 - 1.24 mg/dL   Calcium 8.9 8.9 - 10.3 mg/dL   GFR calc non Af Amer >60 >60 mL/min   GFR calc Af Amer >60 >60 mL/min   Anion gap 9 5 - 15    Comment: Performed at [facility], [address] [place], [place] [address]  Troponin I - ONCE - STAT     Status: None   Collection Time: 04/14/18 10:42 PM  Result Value Ref Range   Troponin I <0.03 <0.03 ng/mL    Comment: Performed at [facility], [address] [place], [place] [address]  Lactic acid, plasma     Status: None   Collection Time: 04/14/18 10:42 PM  Result Value Ref  Range   Lactic Acid, Venous 0.8 0.5 - 1.9 mmol/L    Comment: Performed at [facility], [place], [place] [address]   Ct Angio Chest Pe W And/or Wo Contrast  Result Date: 04/14/2018 CLINICAL DATA:  Shortness of breath pleurisy, dyspnea, recent hospital stay, eval for PE EXAM: CT ANGIOGRAPHY CHEST WITH CONTRAST TECHNIQUE: Multidetector CT imaging of the chest was performed using the standard protocol during bolus administration of intravenous contrast. Multiplanar CT image reconstructions and MIPs were obtained to evaluate the vascular anatomy. CONTRAST:  1mL OMNIPAQUE IOHEXOL 350 MG/ML SOLN COMPARISON:  Radiographs earlier this day. FINDINGS: Cardiovascular: There are no filling defects within the pulmonary arteries to suggest pulmonary embolus. Thoracic aorta is normal in caliber with moderate atherosclerosis. No dissection. Conventional branching pattern from the aortic arch. Heart is normal in size. No pericardial effusion. Mediastinum/Nodes: Prominent bilateral hilar lymph nodes, at 10 mm on the left and 12 mm on the right. No mediastinal adenopathy. The esophagus is decompressed. No visualized thyroid nodule. Lungs/Pleura: Emphysema with bronchial thickening. Retained mucus in the dependent trachea. No focal airspace disease. No pulmonary edema. No pleural fluid. 5 mm nodular opacity in the dependent left lower lobe image 89 series 6 is likely atelectasis but nonspecific. No pulmonary mass. Mild anterior right upper lobe scarring. Upper Abdomen: No acute abnormality. Musculoskeletal: Minor T3 compression fracture. There are no acute or suspicious osseous abnormalities. Review of the MIP images confirms the above findings. IMPRESSION: 1. No pulmonary embolus. 2. Emphysema with bronchial thickening. Retained mucus in the dependent trachea. 3. Prominent bilateral hilar lymph nodes are likely reactive. 4. 5 mm nodular focus in the dependent left lower lobe is indeterminate for nodule  versus atelectasis. Given smoking history recommend follow-up non-contrast CT in 12 months. Aortic Atherosclerosis (ICD10-I70.0) and Emphysema (ICD10-J43.9). Electronically Signed   By: Keith Rake M.D.   On: 04/14/2018 23:50   Dg Chest Port 1 View  Result Date: 04/14/2018 CLINICAL DATA:  Increased shortness of breath today. History of COPD. EXAM: PORTABLE CHEST 1 VIEW COMPARISON:  02/14/2018 FINDINGS: Chronic hyperinflation. The cardiomediastinal contours are normal. Mild central bronchial thickening. Pulmonary vasculature is normal. No consolidation, pleural effusion, or pneumothorax. No acute osseous abnormalities  are seen. IMPRESSION: Chronic hyperinflation with mild central bronchial thickening, imaging findings consistent with COPD. No localizing pulmonary process. Electronically Signed   By: Keith Rake M.D.   On: 04/14/2018 22:25    Pending Labs Unresulted Labs (From admission, onward)    Start     Ordered   04/14/18 2156  Blood Culture (routine x 2)  BLOOD CULTURE X 2,   STAT     04/14/18 2155   Signed and Held  HIV antibody (Routine Testing)  Once,   R     Signed and Held          Vitals/Pain Today's Vitals   04/14/18 2326 04/15/18 0001 04/15/18 0026 04/15/18 0119  BP:  104/61  114/71  Pulse:  (!) 103  98  Resp:  (!) 23  17  Temp:  97.8 F (36.6 C)  98.1 F (36.7 C)  TempSrc:  Oral  Oral  SpO2:  97%  99%  Weight:   54.4 kg   Height:   5\' 8"  (1.727 m)   PainSc: 6  0-No pain  0-No pain    Isolation Precautions No active isolations  Medications Medications  ipratropium-albuterol (DUONEB) 0.5-2.5 (3) MG/3ML nebulizer solution 3 mL (3 mLs Nebulization Given 04/14/18 2305)  ipratropium-albuterol (DUONEB) 0.5-2.5 (3) MG/3ML nebulizer solution 3 mL (3 mLs Nebulization Given 04/14/18 2305)  morphine 4 MG/ML injection 4 mg (4 mg Intravenous Given 04/14/18 2302)  aspirin chewable tablet 324 mg (324 mg Oral Given 04/14/18 2301)  azithromycin (ZITHROMAX) tablet 500 mg  (500 mg Oral Given 04/15/18 0019)  iohexol (OMNIPAQUE) 350 MG/ML injection 75 mL (75 mLs Intravenous Contrast Given 04/14/18 2329)    Mobility walks Low fall risk   Focused Assessments Cardiac Assessment Handoff:  Cardiac Rhythm: Normal sinus rhythm Lab Results  Component Value Date   TROPONINI <0.03 04/14/2018   No results found for: DDIMER Does the Patient currently have chest pain? No     R Recommendations: See Admitting Provider Note  Report given to:   Additional Notes:

## 2018-04-15 NOTE — Evaluation (Signed)
Occupational Therapy Evaluation Patient Details Name: Kevin Nelson MRN: [id number] DOB: 1950-11-10 Today's Date: 04/15/2018    History of Present Illness Kevin Nelson is a 68 y.o. male with a history of COPD who is a  smoker, has prostate CA, hx of substance abuse who presented to the emergency room with shortness of breath and nonproductive cough and wheezing. Patient was recently hospitalized for pneumonia was referred to hospital service for COPD exacerbation   Clinical Impression   Pt. presents with weakness, limited activity tolerance, decreased activity tolerance, and limited functional mobility which hinders his ability to complete basic ADL and IADL functioning. Pt. has recently moved back to this area. Pt. Is currently staying with friends until he can secure housing. Pt. was independent with ADLs, and IADL functioning: including meal preparation, and medication management. Pt. was on 2L O2 at home. Pt. SO2 is 93%, HR 99 bpms. Pt. edcuation was provided about A/E use for ADLs, as well as energy conservation, and work simplification techniques during ADLs, and IADLs. Pt. Was provided with a visual handout. Pt. Could benefit from OT services for ADL training, A/E training, and pt. Education about Energy conservation, and work simplification techniques, home modification, and DME. Pt. Could benefit from follow OT services upon discharge.    Follow Up Recommendations  Home health OT    Equipment Recommendations       Recommendations for Other Services       Precautions / Restrictions Precautions Precautions: None Restrictions Weight Bearing Restrictions: No      Mobility Bed Mobility Overal bed mobility: Independent             General bed mobility comments: (Patient on 2.5 LO2, with desaturation to 85 % sitting EOB)  Transfers                 General transfer comment: (NT due to O2 desaturation 85% sitting EOB)    Balance Overall balance assessment: No apparent  balance deficits (not formally assessed)                                         ADL either performed or assessed with clinical judgement   ADL Overall ADL's : Independent                                             Vision Patient Visual Report: No change from baseline       Perception     Praxis      Pertinent Vitals/Pain Pain Assessment: No/denies pain     Hand Dominance     Extremity/Trunk Assessment Upper Extremity Assessment Upper Extremity Assessment: Overall WFL for tasks assessed          Communication Communication Communication: No difficulties   Cognition Arousal/Alertness: Awake/alert Behavior During Therapy: WFL for tasks assessed/performed(Required redirection.) Overall Cognitive Status: Within Functional Limits for tasks assessed                                     General Comments       Exercises     Shoulder Instructions      Home Living Family/patient expects to be discharged to:: Private residence Living Arrangements: Non-relatives/Friends Available  Help at Discharge: Friend(s) Type of Home: House Home Access: Stairs to enter [employer] of Steps: 1 Entrance Stairs-Rails: None Home Layout: One level     Bathroom Shower/Tub: Occupational psychologist: Standard                Prior Functioning/Environment Level of Independence: Independent                 OT Problem List: Decreased strength;Impaired vision/perception;Decreased knowledge of use of DME or AE;Impaired UE functional use;Decreased coordination;Decreased range of motion;Pain;Decreased activity tolerance      OT Treatment/Interventions: Self-care/ADL training;Therapeutic exercise;Patient/family education;Therapeutic activities;DME and/or AE instruction;Energy conservation    OT Goals(Current goals can be found in the care plan section) Acute Rehab OT Goals Patient Stated Goal: To get  better, return home OT Goal Formulation: With patient Potential to Achieve Goals: Good  OT Frequency: Min 2X/week   Barriers to D/C:            Co-evaluation              AM-PAC OT "6 Clicks" Daily Activity     Outcome Measure Help from another person eating meals?: None Help from another person taking care of personal grooming?: None Help from another person toileting, which includes using toliet, bedpan, or urinal?: A Little Help from another person bathing (including washing, rinsing, drying)?: A Little Help from another person to put on and taking off regular upper body clothing?: A Little Help from another person to put on and taking off regular lower body clothing?: A Little 6 Click Score: 20   End of Session    Activity Tolerance: Patient limited by fatigue Patient left: in bed;with call bell/phone within reach;with bed alarm set  OT Visit Diagnosis: Muscle weakness (generalized) (M62.81);Unsteadiness on feet (R26.81)                Time: 1450-1516 OT Time Calculation (min): 26 min Charges:  OT General Charges $OT Visit: 1 Visit OT Evaluation $OT Eval Low Complexity: 1 Low  Harrel Carina, MS, OTR/L  Harrel Carina 04/15/2018, 3:59 PM

## 2018-04-15 NOTE — Progress Notes (Signed)
Patient admitted this morning with COPD exacerbation.  Patient seen and evaluated.  I will decrease IV steroids.  I will order nicotine patch.  I will order as needed stool softeners.  Tobacco dependence: Patient is encouraged to quit smoking and willing to attempt to quit was assessed. Patient highly motivated.Counseling was provided for 4 minutes.

## 2018-04-15 NOTE — ED Notes (Signed)
Pt sleeping upon RN entrance to exam room no distress noted

## 2018-04-15 NOTE — H&P (Signed)
[facility] NAME: Kevin Nelson    MR#:  [id number]  DATE OF BIRTH:  1950-06-09  DATE OF ADMISSION:  04/14/2018  PRIMARY CARE PHYSICIAN: [name], NP   REQUESTING/REFERRING PHYSICIAN: ED  CHIEF COMPLAINT:   Chief Complaint  Patient presents with  . Shortness of Breath    HISTORY OF PRESENT ILLNESS:  Kevin Nelson  is a 68 y.o. male with a known history of COPD who is not currently on oxygen who is active smoker, prostate CA, substance abuse who presented to the emergency room with shortness of breath and wheezing.  Patient reports nonproductive cough along with worsening wheezing.  Patient denies fever.  Patient reports pleuritic chest pain.  Patient denies nausea vomiting or abdominal pain or urinary symptoms.  Patient was recently hospitalized for pneumonia and he was diagnosed with prostate cancer.  She has been using inhaler at home without much improvement.  Patient denies fever or chills or body aches or URI symptoms.  On arrival to emergency room he was tachycardic and tachypneic with oxygen saturation 96% on room air.  Laboratory work-up shows normal troponin and normal lactic acid.  White count is 11,000.  Chemistries normal.  CTA of the chest was done shows no evidence of PE.  Chest x-ray shows emphysematous changes.  Patient was treated with continuous and PRN breathing treatment in the emergency room and was given Solu-Medrol.  Patient was referred to hospital service for COPD exacerbation.  PAST MEDICAL HISTORY:   Past Medical History:  Diagnosis Date  . Basal cell carcinoma   . End stage COPD ([place])   . History of nonmelanoma skin cancer   . Prostate cancer ([place])   . Squamous cell skin cancer     PAST SURGICAL HISTORY:   Past Surgical History:  Procedure Laterality Date  . CHOLECYSTECTOMY    . FACIAL LACERATION REPAIR    . SKIN CANCER EXCISION      SOCIAL HISTORY:   Social History   Tobacco Use  .  Smoking status: Current Every Day Smoker    Packs/day: 2.00    Years: 51.00    Pack years: 102.00    Types: Cigarettes  . Smokeless tobacco: Never Used  Substance Use Topics  . Alcohol use: Yes    Alcohol/week: 3.0 standard drinks    Types: 3 Cans of beer per week    FAMILY HISTORY:   Family History  Problem Relation Age of Onset  . Cancer Sister   . Cancer - Cervical Mother   . Heart attack Brother   . Heart attack Maternal Grandfather     DRUG ALLERGIES:  No Known Allergies  REVIEW OF SYSTEMS:   ROS  MEDICATIONS AT HOME:   Prior to Admission medications   Medication Sig Start Date End Date Taking? Authorizing Provider  albuterol (PROVENTIL) (2.5 MG/3ML) 0.083% nebulizer solution Take 3 mLs (2.5 mg total) by nebulization every 6 (six) hours as needed for wheezing or shortness of breath. 01/27/18   Cannady, Henrine Screws T, NP  clonazePAM (KLONOPIN) 0.5 MG tablet Take 1 tablet (0.5 mg total) by mouth 2 (two) times daily as needed for anxiety. 01/27/18   Cannady, Henrine Screws T, NP  Ipratropium-Albuterol (COMBIVENT RESPIMAT) 20-100 MCG/ACT AERS respimat Inhale 1 puff into the lungs every 6 (six) hours as needed. 01/27/18   Cannady, Henrine Screws T, NP  leuprolide (LUPRON) 30 MG injection Inject 30 mg into the muscle every 6 (six) months.  [provider]  umeclidinium-vilanterol (ANORO ELLIPTA) 62.5-25 MCG/INH AEPB Inhale 1 puff into the lungs daily. 01/27/18   Wilhelmina Mcardle, MD      VITAL SIGNS:  Blood pressure 104/61, pulse (!) 103, temperature 97.8 F (36.6 C), temperature source Oral, resp. rate (!) 23, height 5\' 8"  (1.727 m), weight 54.4 kg, SpO2 97 %.  PHYSICAL EXAMINATION:  Physical Exam  GENERAL:  68 y.o.-year-old patient lying in the bed with no acute distress.  EYES: Pupils equal, round, reactive to light and accommodation. No scleral icterus. Extraocular muscles intact.  HEENT: Head atraumatic, normocephalic. Oropharynx and nasopharynx clear.  NECK:  Supple, no  jugular venous distention. No thyroid enlargement, no tenderness.  LUNGS: Diminished breath sounds bilaterally, scattered wheezing, rales,rhonchi or crepitation. No use of accessory muscles of respiration.  CARDIOVASCULAR: S1, S2 normal. No murmurs, rubs, or gallops.  ABDOMEN: Soft, nontender, nondistended. Bowel sounds present. No organomegaly or mass.  EXTREMITIES: No pedal edema, cyanosis, or clubbing.  NEUROLOGIC: Cranial nerves II through XII are intact. Muscle strength 5/5 in all extremities. Sensation intact. Gait not checked.  PSYCHIATRIC: The patient is alert and oriented x 3.  SKIN: No obvious rash, lesion, or ulcer.   LABORATORY PANEL:   CBC Recent Labs  Lab 04/14/18 2242  WBC 11.7*  HGB 12.9*  HCT 40.2  PLT 333   ------------------------------------------------------------------------------------------------------------------  Chemistries  Recent Labs  Lab 04/14/18 2242  NA 137  K 4.0  CL 95*  CO2 33*  GLUCOSE 110*  BUN 9  CREATININE 0.49*  CALCIUM 8.9   ------------------------------------------------------------------------------------------------------------------  Cardiac Enzymes Recent Labs  Lab 04/14/18 2242  TROPONINI <0.03   ------------------------------------------------------------------------------------------------------------------  RADIOLOGY:  Ct Angio Chest Pe W And/or Wo Contrast  Result Date: 04/14/2018 CLINICAL DATA:  Shortness of breath pleurisy, dyspnea, recent hospital stay, eval for PE EXAM: CT ANGIOGRAPHY CHEST WITH CONTRAST TECHNIQUE: Multidetector CT imaging of the chest was performed using the standard protocol during bolus administration of intravenous contrast. Multiplanar CT image reconstructions and MIPs were obtained to evaluate the vascular anatomy. CONTRAST:  36mL OMNIPAQUE IOHEXOL 350 MG/ML SOLN COMPARISON:  Radiographs earlier this day. FINDINGS: Cardiovascular: There are no filling defects within the pulmonary arteries  to suggest pulmonary embolus. Thoracic aorta is normal in caliber with moderate atherosclerosis. No dissection. Conventional branching pattern from the aortic arch. Heart is normal in size. No pericardial effusion. Mediastinum/Nodes: Prominent bilateral hilar lymph nodes, at 10 mm on the left and 12 mm on the right. No mediastinal adenopathy. The esophagus is decompressed. No visualized thyroid nodule. Lungs/Pleura: Emphysema with bronchial thickening. Retained mucus in the dependent trachea. No focal airspace disease. No pulmonary edema. No pleural fluid. 5 mm nodular opacity in the dependent left lower lobe image 89 series 6 is likely atelectasis but nonspecific. No pulmonary mass. Mild anterior right upper lobe scarring. Upper Abdomen: No acute abnormality. Musculoskeletal: Minor T3 compression fracture. There are no acute or suspicious osseous abnormalities. Review of the MIP images confirms the above findings. IMPRESSION: 1. No pulmonary embolus. 2. Emphysema with bronchial thickening. Retained mucus in the dependent trachea. 3. Prominent bilateral hilar lymph nodes are likely reactive. 4. 5 mm nodular focus in the dependent left lower lobe is indeterminate for nodule versus atelectasis. Given smoking history recommend follow-up non-contrast CT in 12 months. Aortic Atherosclerosis (ICD10-I70.0) and Emphysema (ICD10-J43.9). Electronically Signed   By: Keith Rake M.D.   On: 04/14/2018 23:50   Dg Chest Port 1 View  Result Date: 04/14/2018 CLINICAL DATA:  Increased  shortness of breath today. History of COPD. EXAM: PORTABLE CHEST 1 VIEW COMPARISON:  02/14/2018 FINDINGS: Chronic hyperinflation. The cardiomediastinal contours are normal. Mild central bronchial thickening. Pulmonary vasculature is normal. No consolidation, pleural effusion, or pneumothorax. No acute osseous abnormalities are seen. IMPRESSION: Chronic hyperinflation with mild central bronchial thickening, imaging findings consistent with COPD.  No localizing pulmonary process. Electronically Signed   By: Keith Rake M.D.   On: 04/14/2018 22:25      IMPRESSION AND PLAN:   68 year old patient with COPD who is active smoker presented with COPD exacerbation.  1.  Acute exacerbation of COPD: Patient will be admitted as inpatient for further management of COPD exacerbation.  We will continue scheduled Solu-Medrol as well as scheduled and PRN breathing treatment.  We will continue empiric Zithromax.  CTA negative for embolism or infiltrate.  No concern for COVID-19.  2.  Resume current home medications.  3.  Regular diet.  4.  Lovenox subcu for DVT prophylaxis    All the records are reviewed and case discussed with ED provider. Management plans discussed with the patient, family and they are in agreement.  CODE STATUS: Full code  TOTAL TIME TAKING CARE OF THIS PATIENT: 35 minutes minutes.    Marygrace Drought M.D on 04/15/2018 at 12:39 AM  Between 7am to 6pm - Pager - [phone]  After 6pm go to [email or web address] - Proofreader  [facility]  Office  [phone]  CC: Primary care physician; [name], NP   Note: This dictation was prepared with Dragon dictation along with smaller phrase technology. Any transcriptional errors that result from this process are unintentional.

## 2018-04-15 NOTE — Progress Notes (Signed)
Physical Therapy Evaluation Patient Details Name: Kevin Nelson MRN: [id number] DOB: 12/27/1950 Today's Date: 04/15/2018   History of Present Illness  Danzell Birky is a 68 y.o. male with a history of COPD who is a  smoker, has prostate CA, hx of substance abuse who presented to the emergency room with shortness of breath and nonproductive cough and wheezing. Patient was recently hospitalized for pneumonia was referred to hospital service for COPD exacerbation  Clinical Impression  Patient performs bed mobility independently supine to sit and sit to supine. He is on 2.5 L O2 and O2 desaturates to 85% in sitting EOB. Patient's strength is WFL BLE. Patients sitting balance is WFL. His transfers and gait will be assessed at next visit. He will benefit from skilled PT to improve mobility .     Follow Up Recommendations Home health PT    Equipment Recommendations  Rolling walker with 5" wheels    Recommendations for Other Services       Precautions / Restrictions Precautions Precautions: None Restrictions Weight Bearing Restrictions: No      Mobility  Bed Mobility Overal bed mobility: Independent             General bed mobility comments: (Patient on 2.5 LO2, with desaturation to 85 % sitting EOB)  Transfers                 General transfer comment: (NT due to O2 desaturation 85% sitting EOB)  Ambulation/Gait                Stairs            Wheelchair Mobility    Modified Rankin (Stroke Patients Only)       Balance Overall balance assessment: No apparent balance deficits (not formally assessed)                                           Pertinent Vitals/Pain Pain Assessment: No/denies pain    Home Living Family/patient expects to be discharged to:: Private residence Living Arrangements: Non-relatives/Friends Available Help at Discharge: Friend(s) Type of Home: House Home Access: Stairs to enter Entrance Stairs-Rails:  None Technical brewer of Steps: 1 Home Layout: One level        Prior Function Level of Independence: Independent               Hand Dominance        Extremity/Trunk Assessment   Upper Extremity Assessment Upper Extremity Assessment: Overall WFL for tasks assessed    Lower Extremity Assessment Lower Extremity Assessment: Overall WFL for tasks assessed       Communication   Communication: No difficulties  Cognition Arousal/Alertness: Awake/alert Behavior During Therapy: WFL for tasks assessed/performed Overall Cognitive Status: Within Functional Limits for tasks assessed                                        General Comments      Exercises     Assessment/Plan    PT Assessment Patient needs continued PT services  PT Problem List Decreased activity tolerance;Cardiopulmonary status limiting activity       PT Treatment Interventions Gait training;Therapeutic activities    PT Goals (Current goals can be found in the Care Plan section)  Acute Rehab PT Goals Patient Stated Goal: (  to walk better) PT Goal Formulation: Patient unable to participate in goal setting Time For Goal Achievement: 04/29/18 Potential to Achieve Goals: Good    Frequency Min 2X/week   Barriers to discharge        Co-evaluation               AM-PAC PT "6 Clicks" Mobility  Outcome Measure Help needed turning from your back to your side while in a flat bed without using bedrails?: None Help needed moving from lying on your back to sitting on the side of a flat bed without using bedrails?: None Help needed moving to and from a bed to a chair (including a wheelchair)?: Total Help needed standing up from a chair using your arms (e.g., wheelchair or bedside chair)?: A Lot Help needed to walk in hospital room?: A Lot Help needed climbing 3-5 steps with a railing? : A Lot 6 Click Score: 15    End of Session Equipment Utilized During Treatment: Gait  belt Activity Tolerance: Treatment limited secondary to medical complications (Comment)     PT Visit Diagnosis: Difficulty in walking, not elsewhere classified (R26.2)    Time: 1130-1145 PT Time Calculation (min) (ACUTE ONLY): 15 min   Charges:   PT Evaluation $PT Eval Low Complexity: 1 Low           Alanson Puls, PT DPT 04/15/2018, 12:33 PM

## 2018-04-16 MED ORDER — NICOTINE 21 MG/24HR TD PT24: 21.0000 mg | MEDICATED_PATCH | Freq: Every day | TRANSDERMAL | 0 refills | Status: DC

## 2018-04-16 MED ORDER — AZITHROMYCIN 250 MG PO TABS: 250.0000 mg | ORAL_TABLET | Freq: Every day | ORAL | 0 refills | Status: AC

## 2018-04-16 MED ORDER — PREDNISONE 50 MG PO TABS: 50.0000 mg | ORAL_TABLET | Freq: Every day | ORAL | 0 refills | Status: AC

## 2018-04-16 NOTE — Progress Notes (Signed)
MD order received in [facility] to discharge pt home with home health and home oxygen today; see Case Manager's 04/16/2018 1046am note; verbally reviewed AVS with pt, gave Rxs to pt to take to his pharmacy to get filled; no questions voiced at this time; pt's discharge pending arrival of his ride home; verbally instructed pt to have his ride call his room to advise they are at the medical mall entrance in order to discharge him to the appropriate exit

## 2018-04-16 NOTE — TOC Initial Note (Signed)
Transition of Care [facility]) - Initial/Assessment Note    Patient Details  Name: Kevin Nelson MRN: [id number] Date of Birth: [birth year]  [facility] CM/SW Contact:    Latanya Maudlin, RN Phone Number: 04/16/2018, 10:46 AM  Clinical Narrative:  Patient to be discharged per MD order. Orders in place for home health services. Patient is currently moving from [facility] to here. He has orchestrated some section 8 housing for $130 a month but is staying with friends until he can get all his belongings here from his previous home. Patient on chronic O2 @ 2.5l from [place]. No other DME needs. Patient unsure why he was recommend home health PT. His plan is to discharge from here and continue moving his stuff "once all this virus mess calms down". Patient not interested in home health because he has had it before "and it didn't do anything for him". He has pulse oximeters to check his O2, etc. Patient has PCP and uses [facility] without issues.                    Expected Discharge Plan: Home/Self Care Barriers to Discharge: No Barriers Identified   Patient Goals and CMS Choice Patient states their goals for this hospitalization and ongoing recovery are:: to finish moving here from [facility]      Expected Discharge Plan and Services Expected Discharge Plan: Home/Self Care     Post Acute Care Choice: [place] arrangements for the past 2 months: Single Family Home Expected Discharge Date: 04/16/18                   [facility] Arranged: Patient Refused HH    Prior Living Arrangements/Services Living arrangements for the past 2 months: Single Family Home Lives with:: Roommate              Current home services: DME    Activities of Daily Living Home Assistive Devices/Equipment: Oxygen ADL Screening (condition at time of admission) Patient's cognitive ability adequate to safely complete daily activities?: Yes Is the patient deaf or have difficulty hearing?:  Yes Does the patient have difficulty seeing, even when wearing glasses/contacts?: No Does the patient have difficulty concentrating, remembering, or making decisions?: No Patient able to express need for assistance with ADLs?: Yes Does the patient have difficulty dressing or bathing?: Yes Independently performs ADLs?: Yes (appropriate for developmental age) Does the patient have difficulty walking or climbing stairs?: No Weakness of Legs: Both Weakness of Arms/Hands: None  Permission Sought/Granted                  Emotional Assessment Appearance:: Appears older than stated age Attitude/Demeanor/Rapport: Attention Seeking, Engaged, Complaining Affect (typically observed): Blunt Orientation: : Oriented to Self, Oriented to Place, Oriented to  Time, Oriented to Situation      Admission diagnosis:  COPD with acute exacerbation [facility]) [J44.1] Patient Active Problem List   Diagnosis Date Noted  . COPD exacerbation ([place]) 04/15/2018  . Nicotine dependence, cigarettes, uncomplicated 38/25/0539  . Controlled substance agreement signed 01/27/2018  . Chronic hypoxemic respiratory failure ([place]) 01/26/2018  . COPD, very severe ([place]) 01/13/2018  . Prostate cancer ([place]) 01/13/2018  . History of basal cell carcinoma ([facility]) 01/13/2018  . Generalized anxiety disorder 01/13/2018   PCP:  [name], NP Pharmacy:   [facility] [place], [place] - [address] AT [facility] [place] [address] Phone: [phone] Fax: [phone]  Social Determinants of Health (SDOH) Interventions    Readmission Risk Interventions No flowsheet data found.

## 2018-04-16 NOTE — Discharge Instructions (Signed)
[place] services

## 2018-04-16 NOTE — Progress Notes (Signed)
Pt's ride present at the [facility] Mall entrance; pt discharged via wheelchair by nursing to the medical mall entrance 

## 2018-04-16 NOTE — Discharge Summary (Signed)
[facility] NAME: Kevin Nelson    MR#:  [id number]  DATE OF BIRTH:  03-02-1950  DATE OF ADMISSION:  04/14/2018 ADMITTING PHYSICIAN: No admitting provider for patient encounter.  DATE OF DISCHARGE: 04/16/2018  PRIMARY CARE PHYSICIAN: Marnee Guarneri T, NP    ADMISSION DIAGNOSIS:  COPD with acute exacerbation ([place]) [J44.1]  DISCHARGE DIAGNOSIS:  Active Problems:   COPD exacerbation ([place])   SECONDARY DIAGNOSIS:   Past Medical History:  Diagnosis Date  . Basal cell carcinoma   . End stage COPD ([place])   . History of nonmelanoma skin cancer   . Prostate cancer ([place])   . Squamous cell skin cancer     HOSPITAL COURSE:   68 year old male with history of tobacco dependence and COPD who wears oxygen at night who presented to the emergency room due to shortness of breath and cough.  1.  Acute hypoxic respiratory failure in the setting of acute COPD exacerbation with acute bronchitis Patient will require oxygen upon discharge and he will continue nocturnal oxygen.  2.  Acute exacerbation of COPD with acute bronchitis: Patient will be discharged on prednisone and azithromycin to complete course of treatment.  He has no wheezing on examination at the time of discharge.  3.Tobacco dependence: Patient is encouraged to quit smoking and willing to attempt to quit was assessed. Patient highly motivated.Counseling was provided for 4 minutes. He will be discharged with nicotine patch   DISCHARGE CONDITIONS AND DIET:   Stable  Regular diet  CONSULTS OBTAINED:    DRUG ALLERGIES:  No Known Allergies  DISCHARGE MEDICATIONS:   Allergies as of 04/16/2018   No Known Allergies     Medication List    TAKE these medications   albuterol (2.5 MG/3ML) 0.083% nebulizer solution Commonly known as:  PROVENTIL Take 3 mLs (2.5 mg total) by nebulization every 6 (six) hours as needed for wheezing or shortness of breath.   azithromycin 250 MG  tablet Commonly known as:  Zithromax Take 1 tablet (250 mg total) by mouth daily for 4 days.   clonazePAM 0.5 MG tablet Commonly known as:  KLONOPIN Take 1 tablet (0.5 mg total) by mouth 2 (two) times daily as needed for anxiety.   Ipratropium-Albuterol 20-100 MCG/ACT Aers respimat Commonly known as:  Combivent Respimat Inhale 1 puff into the lungs every 6 (six) hours as needed.   leuprolide 30 MG injection Commonly known as:  LUPRON Inject 30 mg into the muscle every 6 (six) months.   nicotine 21 mg/24hr patch Commonly known as:  NICODERM CQ - dosed in mg/24 hours Place 1 patch (21 mg total) onto the skin daily. Start taking on:  April 17, 2018   predniSONE 50 MG tablet Commonly known as:  DELTASONE Take 1 tablet (50 mg total) by mouth daily with breakfast for 4 days.   umeclidinium-vilanterol 62.5-25 MCG/INH Aepb Commonly known as:  Anoro Ellipta Inhale 1 puff into the lungs daily.         Today   CHIEF COMPLAINT:  Patient doing better wants to go home today   VITAL SIGNS:  Blood pressure 118/63, pulse 85, temperature 97.6 F (36.4 C), temperature source Oral, resp. rate 19, height 5\' 6"  (1.676 m), weight 47.6 kg, SpO2 97 %.   REVIEW OF SYSTEMS:  Review of Systems  Constitutional: Negative.  Negative for chills, fever and malaise/fatigue.  HENT: Negative.  Negative for ear discharge, ear pain, hearing loss, nosebleeds and sore throat.  Eyes: Negative.  Negative for blurred vision and pain.  Respiratory: Negative.  Negative for cough, hemoptysis, shortness of breath and wheezing.   Cardiovascular: Negative.  Negative for chest pain, palpitations and leg swelling.  Gastrointestinal: Negative.  Negative for abdominal pain, blood in stool, diarrhea, nausea and vomiting.  Genitourinary: Negative.  Negative for dysuria.  Musculoskeletal: Negative.  Negative for back pain.  Skin: Negative.   Neurological: Negative for dizziness, tremors, speech change, focal  weakness, seizures and headaches.  Endo/Heme/Allergies: Negative.  Does not bruise/bleed easily.  Psychiatric/Behavioral: Negative.  Negative for depression, hallucinations and suicidal ideas.     PHYSICAL EXAMINATION:  GENERAL:  68 y.o.-year-old patient lying in the bed with no acute distress. thin NECK:  Supple, no jugular venous distention. No thyroid enlargement, no tenderness.  LUNGS: Normal breath sounds bilaterally, no wheezing, rales,rhonchi  No use of accessory muscles of respiration.  CARDIOVASCULAR: S1, S2 normal. No murmurs, rubs, or gallops.  ABDOMEN: Soft, non-tender, non-distended. Bowel sounds present. No organomegaly or mass.  EXTREMITIES: No pedal edema, cyanosis, or clubbing.  PSYCHIATRIC: The patient is alert and oriented x 3.  SKIN: No obvious rash, lesion, or ulcer.   DATA REVIEW:   CBC Recent Labs  Lab 04/14/18 2242  WBC 11.7*  HGB 12.9*  HCT 40.2  PLT 333    Chemistries  Recent Labs  Lab 04/14/18 2242  NA 137  K 4.0  CL 95*  CO2 33*  GLUCOSE 110*  BUN 9  CREATININE 0.49*  CALCIUM 8.9    Cardiac Enzymes Recent Labs  Lab 04/14/18 2242  TROPONINI <0.03    Microbiology Results  @MICRORSLT48 @  RADIOLOGY:  Ct Angio Chest Pe W And/or Wo Contrast  Result Date: 04/14/2018 CLINICAL DATA:  Shortness of breath pleurisy, dyspnea, recent hospital stay, eval for PE EXAM: CT ANGIOGRAPHY CHEST WITH CONTRAST TECHNIQUE: Multidetector CT imaging of the chest was performed using the standard protocol during bolus administration of intravenous contrast. Multiplanar CT image reconstructions and MIPs were obtained to evaluate the vascular anatomy. CONTRAST:  65mL OMNIPAQUE IOHEXOL 350 MG/ML SOLN COMPARISON:  Radiographs earlier this day. FINDINGS: Cardiovascular: There are no filling defects within the pulmonary arteries to suggest pulmonary embolus. Thoracic aorta is normal in caliber with moderate atherosclerosis. No dissection. Conventional branching pattern  from the aortic arch. Heart is normal in size. No pericardial effusion. Mediastinum/Nodes: Prominent bilateral hilar lymph nodes, at 10 mm on the left and 12 mm on the right. No mediastinal adenopathy. The esophagus is decompressed. No visualized thyroid nodule. Lungs/Pleura: Emphysema with bronchial thickening. Retained mucus in the dependent trachea. No focal airspace disease. No pulmonary edema. No pleural fluid. 5 mm nodular opacity in the dependent left lower lobe image 89 series 6 is likely atelectasis but nonspecific. No pulmonary mass. Mild anterior right upper lobe scarring. Upper Abdomen: No acute abnormality. Musculoskeletal: Minor T3 compression fracture. There are no acute or suspicious osseous abnormalities. Review of the MIP images confirms the above findings. IMPRESSION: 1. No pulmonary embolus. 2. Emphysema with bronchial thickening. Retained mucus in the dependent trachea. 3. Prominent bilateral hilar lymph nodes are likely reactive. 4. 5 mm nodular focus in the dependent left lower lobe is indeterminate for nodule versus atelectasis. Given smoking history recommend follow-up non-contrast CT in 12 months. Aortic Atherosclerosis (ICD10-I70.0) and Emphysema (ICD10-J43.9). Electronically Signed   By: Keith Rake M.D.   On: 04/14/2018 23:50   Dg Chest Port 1 View  Result Date: 04/14/2018 CLINICAL DATA:  Increased shortness of breath today.  History of COPD. EXAM: PORTABLE CHEST 1 VIEW COMPARISON:  02/14/2018 FINDINGS: Chronic hyperinflation. The cardiomediastinal contours are normal. Mild central bronchial thickening. Pulmonary vasculature is normal. No consolidation, pleural effusion, or pneumothorax. No acute osseous abnormalities are seen. IMPRESSION: Chronic hyperinflation with mild central bronchial thickening, imaging findings consistent with COPD. No localizing pulmonary process. Electronically Signed   By: Keith Rake M.D.   On: 04/14/2018 22:25      Allergies as of 04/16/2018    No Known Allergies     Medication List    TAKE these medications   albuterol (2.5 MG/3ML) 0.083% nebulizer solution Commonly known as:  PROVENTIL Take 3 mLs (2.5 mg total) by nebulization every 6 (six) hours as needed for wheezing or shortness of breath.   azithromycin 250 MG tablet Commonly known as:  Zithromax Take 1 tablet (250 mg total) by mouth daily for 4 days.   clonazePAM 0.5 MG tablet Commonly known as:  KLONOPIN Take 1 tablet (0.5 mg total) by mouth 2 (two) times daily as needed for anxiety.   Ipratropium-Albuterol 20-100 MCG/ACT Aers respimat Commonly known as:  Combivent Respimat Inhale 1 puff into the lungs every 6 (six) hours as needed.   leuprolide 30 MG injection Commonly known as:  LUPRON Inject 30 mg into the muscle every 6 (six) months.   nicotine 21 mg/24hr patch Commonly known as:  NICODERM CQ - dosed in mg/24 hours Place 1 patch (21 mg total) onto the skin daily. Start taking on:  April 17, 2018   predniSONE 50 MG tablet Commonly known as:  DELTASONE Take 1 tablet (50 mg total) by mouth daily with breakfast for 4 days.   umeclidinium-vilanterol 62.5-25 MCG/INH Aepb Commonly known as:  Anoro Ellipta Inhale 1 puff into the lungs daily.         Management plans discussed with the patient and he is in agreement. Stable for discharge home with [facility]  Patient should follow up with pcp  CODE STATUS:     Code Status Orders  (From admission, onward)         Start     Ordered   04/15/18 0221  Full code  Continuous     04/15/18 0220        Code Status History    This patient has a current code status but no historical code status.    Advance Directive Documentation     Most Recent Value  Type of Advance Directive  Healthcare Power of Attorney  Pre-existing out of facility DNR order (yellow form or pink MOST form)  -  "MOST" Form in Place?  -      TOTAL TIME TAKING CARE OF THIS PATIENT: 38 minutes.    Note: This dictation was  prepared with Dragon dictation along with smaller phrase technology. Any transcriptional errors that result from this process are unintentional.  Bettey Costa M.D on 04/16/2018 at 10:24 AM  Between 7am to 6pm - Pager - [phone] After 6pm go to [email or web address] - password [email or web address]  Office  [phone]  CC: Primary care physician; [name], NP

## 2018-04-17 ENCOUNTER — Telehealth: Payer: Self-pay

## 2018-04-17 ENCOUNTER — Telehealth: Payer: Self-pay | Admitting: Nurse Practitioner

## 2018-04-17 LAB — HIV ANTIBODY (ROUTINE TESTING W REFLEX): HIV Screen 4th Generation wRfx: NONREACTIVE

## 2018-04-17 NOTE — Telephone Encounter (Signed)
Noted and appointment scheduled for 04/19/2018.

## 2018-04-17 NOTE — Telephone Encounter (Signed)
Transition Care Management Follow-up Telephone Call  Date of discharge and from where: 04/16/2018  How have you been since you were released from the hospital? "I feel great, I feel 100% better"  Any questions or concerns? No   Items Reviewed:  Did the pt receive and understand the discharge instructions provided? Yes   Medications obtained and verified? No  picking them up today, verified   Any new allergies since your discharge? No   Dietary orders reviewed? No  Do you have support at home? Yes   Functional Questionnaire: (I = Independent and D = Dependent) ADLs: uses oxygen   Bathing/Dressing- i  Meal Prep- i  Eating- i  Maintaining continence- i  Transferring/Ambulation- i  Managing Meds- i  Follow up appointments reviewed:   PCP Hospital f/u appt confirmed? Yes  Scheduled to see [name] Cannady,NP  on 04/19/2018 @ 2:00pm  [facility] f/u appt confirmed? n/a  Are transportation arrangements needed? No   If their condition worsens, is the pt aware to call PCP or go to the Emergency Dept.? yes  Was the patient provided with contact information for the PCP's office or ED? Yes  Was to pt encouraged to call back with questions or concerns? Yes

## 2018-04-17 NOTE — Telephone Encounter (Signed)
Copied from [place] [phone]. Topic: General - Other >> Apr 17, 2018  9:18 AM Carolyn Stare wrote: Pt call and said he was in the hospital and was released on 3/02/26/2018 and has an upcoming on 05/01/2018 but they want him to see her this week

## 2018-04-19 ENCOUNTER — Encounter: Payer: Self-pay | Admitting: Nurse Practitioner

## 2018-04-19 ENCOUNTER — Ambulatory Visit (INDEPENDENT_AMBULATORY_CARE_PROVIDER_SITE_OTHER): Payer: Medicare Other | Admitting: Nurse Practitioner

## 2018-04-19 ENCOUNTER — Other Ambulatory Visit: Payer: Self-pay

## 2018-04-19 VITALS — BP 126/75 | HR 94 | Temp 98.1°F | Ht 67.0 in | Wt 109.0 lb

## 2018-04-19 DIAGNOSIS — J441 Chronic obstructive pulmonary disease with (acute) exacerbation: Secondary | ICD-10-CM | POA: Diagnosis not present

## 2018-04-19 DIAGNOSIS — E441 Mild protein-calorie malnutrition: Secondary | ICD-10-CM

## 2018-04-19 DIAGNOSIS — D692 Other nonthrombocytopenic purpura: Secondary | ICD-10-CM | POA: Diagnosis not present

## 2018-04-19 DIAGNOSIS — R911 Solitary pulmonary nodule: Secondary | ICD-10-CM

## 2018-04-19 DIAGNOSIS — F411 Generalized anxiety disorder: Secondary | ICD-10-CM | POA: Diagnosis not present

## 2018-04-19 DIAGNOSIS — F17210 Nicotine dependence, cigarettes, uncomplicated: Secondary | ICD-10-CM

## 2018-04-19 DIAGNOSIS — IMO0001 Reserved for inherently not codable concepts without codable children: Secondary | ICD-10-CM | POA: Insufficient documentation

## 2018-04-19 DIAGNOSIS — Z1159 Encounter for screening for other viral diseases: Secondary | ICD-10-CM

## 2018-04-19 DIAGNOSIS — J9611 Chronic respiratory failure with hypoxia: Secondary | ICD-10-CM

## 2018-04-19 DIAGNOSIS — E46 Unspecified protein-calorie malnutrition: Secondary | ICD-10-CM | POA: Insufficient documentation

## 2018-04-19 LAB — CULTURE, BLOOD (ROUTINE X 2)
Culture: NO GROWTH
Special Requests: ADEQUATE

## 2018-04-19 MED ORDER — CLONAZEPAM 0.5 MG PO TABS: 0.5000 mg | ORAL_TABLET | Freq: Two times a day (BID) | ORAL | 2 refills | Status: DC | PRN

## 2018-04-19 NOTE — Assessment & Plan Note (Signed)
I have recommended complete cessation of tobacco use. I have discussed various options available for assistance with tobacco cessation including over the counter methods (Nicotine gum, patch and lozenges). We also discussed prescription options (Chantix, Nicotine Inhaler / Nasal Spray). The patient is to continue Nicotine patch at this time.

## 2018-04-19 NOTE — Progress Notes (Addendum)
BP 126/75   Pulse 94   Temp 98.1 F (36.7 C) (Oral)   Ht 5\' 7"  (1.702 m)   Wt 109 lb (49.4 kg)   SpO2 96% Comment: 2 L O2  BMI 17.07 kg/m    Subjective:    Patient ID: Kevin Nelson, male    DOB: [birth year], 68 y.o.   MRN: [id number]  HPI: Kevin Nelson is a 68 y.o. male  Chief Complaint  Patient presents with  . Hospitalization Follow-up   Transition of Care Hospital Follow up.  Admitted to hospital from 04/14/2018 to 04/16/2018 for COPD acute exacerbation.  He is followed by pulmonology and last saw them 02/16/2018.  Is O2 dependent, 2L at all times.  Was discharged home on Prednisone and Azithromycin.  Orders for Anoro as maintenance and Duoneb + Albuterol as needed.  Currently he is not using Anoro, reports "this does not work for me".  Is taking Combivent and Spiriva Respimat.  Uses Combivent two puffs twice a day and Spiriva Respimat two puffs once a day.  Had been working out in his yard prior to admission, was having chest discomfort at time, even with his PRN Clonazepam.  Troponin <0.03 in hospital.  Endorses he was not wearing his oxygen "enough during the day time".  He is also followed by urology for prostate CA, last seen 02/08/2018.  Receives Lupron.  PSA on 02/08/2018 was 1.2.  He is smoker, was smoking five a day before his hospitalization.  Now using Nicotine patch and has not smoked cigarettes since discharge.  He endorses concerns about his housing today and finances.  Current income is $700/month, lives with two friends of his.  Discussed with him progressive nature of COPD and discussed his goals of care, educated on palliative care.  He is not interested at this time and wishes to further think about his goals of care.  Hospital/Facility: [facility] D/C Physician: Dr. Benjie Karvonen D/C Date: 04/16/2018  Records Requested: Within EPIC Records Received: Within EPIC Records Reviewed: Reviewed in EPIC  Diagnoses on Discharge: COPD acute exacerbation  Date of interactive Contact within 48  hours of discharge:  Contact was through: phone  Date of 7 day or 14 day face-to-face visit:    within 7 days  Outpatient Encounter Medications as of 04/19/2018  Medication Sig Note  . albuterol (PROVENTIL) (2.5 MG/3ML) 0.083% nebulizer solution Take 3 mLs (2.5 mg total) by nebulization every 6 (six) hours as needed for wheezing or shortness of breath.   [name] azithromycin (ZITHROMAX) 250 MG tablet Take 1 tablet (250 mg total) by mouth daily for 4 days.   . clonazePAM (KLONOPIN) 0.5 MG tablet Take 1 tablet (0.5 mg total) by mouth 2 (two) times daily as needed for anxiety.   . Ipratropium-Albuterol (COMBIVENT RESPIMAT) 20-100 MCG/ACT AERS respimat Inhale 1 puff into the lungs every 6 (six) hours as needed.   [name] leuprolide (LUPRON) 30 MG injection Inject 30 mg into the muscle every 6 (six) months.   . nicotine (NICODERM CQ - DOSED IN MG/24 HOURS) 21 mg/24hr patch Place 1 patch (21 mg total) onto the skin daily.   . predniSONE (DELTASONE) 50 MG tablet Take 1 tablet (50 mg total) by mouth daily with breakfast for 4 days.   [name] tiotropium (SPIRIVA) 18 MCG inhalation capsule Place 18 mcg into inhaler and inhale daily.   . [DISCONTINUED] clonazePAM (KLONOPIN) 0.5 MG tablet Take 1 tablet (0.5 mg total) by mouth 2 (two) times daily as needed for anxiety. 04/19/2018: Patient  taking differently. 2 tablets daily at bed time  . [DISCONTINUED] umeclidinium-vilanterol (ANORO ELLIPTA) 62.5-25 MCG/INH AEPB Inhale 1 puff into the lungs daily.    Facility-Administered Encounter Medications as of 04/19/2018  Medication  . Leuprolide Acetate (6 Month) (LUPRON) injection 45 mg    Diagnostic Tests Reviewed/Disposition:   CT SCAN 04/14/2018: IMPRESSION: 1. No pulmonary embolus. 2. Emphysema with bronchial thickening. Retained mucus in the dependent trachea. 3. Prominent bilateral hilar lymph nodes are likely reactive. 4. 5 mm nodular focus in the dependent left lower lobe is indeterminate for nodule versus atelectasis.  Given smoking history recommend follow-up non-contrast CT in 12 months. Electronically Signed Keith Rake MD  CXR on 04/14/2018: FINDINGS: Chronic hyperinflation. The cardiomediastinal contours are normal. Mild central bronchial thickening. Pulmonary vasculature is normal. No consolidation, pleural effusion, or pneumothorax. No acute osseous abnormalities are seen. IMPRESSION: Chronic hyperinflation with mild central bronchial thickening, imaging findings consistent with COPD. No localizing pulmonary process. Electronically Signed Keith Rake M.D.   WBC 4.0 - 10.5 K/uL 11.7High    RBC 4.22 - 5.81 MIL/uL 4.44   Hemoglobin 13.0 - 17.0 g/dL 12.9Low    HCT 39.0 - 52.0 % 40.2   MCV 80.0 - 100.0 fL 90.5   MCH 26.0 - 34.0 pg 29.1   MCHC 30.0 - 36.0 g/dL 32.1   RDW 11.5 - 15.5 % 12.9   Platelets 150 - 400 K/uL 333   nRBC 0.0 - 0.2 % 0.0   Neutrophils Relative % % 72   Neutro Abs 1.7 - 7.7 K/uL 8.5High    Lymphocytes Relative % 14   Lymphs Abs 0.7 - 4.0 K/uL 1.6   Monocytes Relative % 11   Monocytes Absolute 0.1 - 1.0 K/uL 1.3High    Eosinophils Relative % 2   Eosinophils Absolute 0.0 - 0.5 K/uL 0.2   Basophils Relative % 1   Basophils Absolute 0.0 - 0.1 K/uL 0.1   Immature Granulocytes % 0   Abs Immature Granulocytes 0.00 - 0.07 K/uL 0.04     Sodium 135 - 145 mmol/L 137   Potassium 3.5 - 5.1 mmol/L 4.0   Chloride 98 - 111 mmol/L 95Low    CO2 22 - 32 mmol/L 33High    Glucose, Bld 70 - 99 mg/dL 110High    BUN 8 - 23 mg/dL 9   Creatinine, Ser 0.61 - 1.24 mg/dL 0.49Low    Calcium 8.9 - 10.3 mg/dL 8.9   GFR calc non Af Amer >60 mL/min >60   GFR calc Af Amer >60 mL/min >60   Anion gap 5 - 15 9    Lactic Acid, Venous 0.5 - 1.9 mmol/L 0.8     Consults: none  Discharge Instructions: Follow-up with PCP  Disease/illness Education:  Home Health/Community Services Discussions/Referrals: none  Establishment or re-establishment of referral orders for community  resources: none  Discussion with other health care providers: N/A  Assessment and Support of treatment regimen adherence: Discussed CCM referral with patient today.  Appointments Coordinated with: Discussed CCM referral with patient today  Education for self-management, independent living, and ADLs:  Educated on COPD and trajectory.  Relevant past medical, surgical, family and social history reviewed and updated as indicated. Interim medical history since our last visit reviewed. Allergies and medications reviewed and updated.  Review of Systems  Constitutional: Negative for activity change, diaphoresis, fatigue and fever.  Respiratory: Positive for cough (chronic, but reports improved). Negative for chest tightness, shortness of breath (reports this has improved) and wheezing.  Cardiovascular: Negative for chest pain, palpitations and leg swelling.  Gastrointestinal: Negative for abdominal distention, abdominal pain, constipation, diarrhea, nausea and vomiting.  Endocrine: Negative for cold intolerance, heat intolerance, polydipsia, polyphagia and polyuria.  Musculoskeletal: Negative.   Skin: Negative.   Neurological: Negative for dizziness, syncope, weakness, light-headedness, numbness and headaches.  Psychiatric/Behavioral: Negative.     Per HPI unless specifically indicated above     Objective:    BP 126/75   Pulse 94   Temp 98.1 F (36.7 C) (Oral)   Ht 5\' 7"  (1.702 m)   Wt 109 lb (49.4 kg)   SpO2 96% Comment: 2 L O2  BMI 17.07 kg/m   Wt Readings from Last 3 Encounters:  04/19/18 109 lb (49.4 kg)  04/15/18 104 lb 14.4 oz (47.6 kg)  02/16/18 112 lb (50.8 kg)    Physical Exam Vitals signs and nursing note reviewed.  Constitutional:      General: He is awake.     Appearance: He is well-developed and underweight.  HENT:     Head: Normocephalic and atraumatic.     Right Ear: Hearing, tympanic membrane, ear canal and external ear normal. No drainage.     Left Ear:  Hearing, tympanic membrane, ear canal and external ear normal. No drainage.     Nose: Nose normal.     Right Sinus: No maxillary sinus tenderness or frontal sinus tenderness.     Left Sinus: No maxillary sinus tenderness or frontal sinus tenderness.     Mouth/Throat:     Pharynx: Oropharynx is clear. Uvula midline.  Eyes:     General: Lids are normal.        Right eye: No discharge.        Left eye: No discharge.     Conjunctiva/sclera: Conjunctivae normal.     Pupils: Pupils are equal, round, and reactive to light.  Neck:     Musculoskeletal: Normal range of motion and neck supple.     Thyroid: No thyromegaly.     Vascular: No carotid bruit.     Trachea: Trachea normal.  Cardiovascular:     Rate and Rhythm: Normal rate and regular rhythm.     Heart sounds: Normal heart sounds, S1 normal and S2 normal. No murmur. No gallop.   Pulmonary:     Effort: Pulmonary effort is normal. No accessory muscle usage or respiratory distress.     Comments: Clear, diminished breath sounds throughout noted.  No tripoding.  No SOB with talking.  Intermittent nonproductive cough.   Abdominal:     General: Bowel sounds are normal.     Palpations: Abdomen is soft. There is no hepatomegaly or splenomegaly.  Musculoskeletal: Normal range of motion.     Right lower leg: No edema.     Left lower leg: No edema.  Lymphadenopathy:     Cervical: No cervical adenopathy.  Skin:    General: Skin is warm and dry.     Capillary Refill: Capillary refill takes less than 2 seconds.     Findings: No rash.     Comments: Scattered areas of pale purple bruising to bilateral upper extremities.  Neurological:     Mental Status: He is alert and oriented to person, place, and time.     Deep Tendon Reflexes: Reflexes are normal and symmetric.  Psychiatric:        Attention and Perception: Attention normal.        Mood and Affect: Mood normal.  Speech: Speech normal.        Behavior: Behavior normal. Behavior is  cooperative.        Thought Content: Thought content normal.        Judgment: Judgment normal.     Results for orders placed or performed during the hospital encounter of 04/14/18  Blood Culture (routine x 2)  Result Value Ref Range   Specimen Description BLOOD RIGHT FOREARM    Special Requests      BOTTLES DRAWN AEROBIC AND ANAEROBIC Blood Culture adequate volume   Culture      NO GROWTH 5 DAYS Performed at [facility], [address] [place], [place] [address]    Report Status 04/19/2018 FINAL   Blood Culture (routine x 2)  Result Value Ref Range   Specimen Description BLOOD RIGHT UPPER ARM    Special Requests      BOTTLES DRAWN AEROBIC AND ANAEROBIC Blood Culture results may not be optimal due to an excessive volume of blood received in culture bottles   Culture      NO GROWTH 4 DAYS Performed at [facility], [address] [place], [place] [address]    Report Status PENDING   CBC with Differential  Result Value Ref Range   WBC 11.7 (H) 4.0 - 10.5 K/uL   RBC 4.44 4.22 - 5.81 MIL/uL   Hemoglobin 12.9 (L) 13.0 - 17.0 g/dL   HCT 40.2 39.0 - 52.0 %   MCV 90.5 80.0 - 100.0 fL   MCH 29.1 26.0 - 34.0 pg   MCHC 32.1 30.0 - 36.0 g/dL   RDW 12.9 11.5 - 15.5 %   Platelets 333 150 - 400 K/uL   nRBC 0.0 0.0 - 0.2 %   Neutrophils Relative % 72 %   Neutro Abs 8.5 (H) 1.7 - 7.7 K/uL   Lymphocytes Relative 14 %   Lymphs Abs 1.6 0.7 - 4.0 K/uL   Monocytes Relative 11 %   Monocytes Absolute 1.3 (H) 0.1 - 1.0 K/uL   Eosinophils Relative 2 %   Eosinophils Absolute 0.2 0.0 - 0.5 K/uL   Basophils Relative 1 %   Basophils Absolute 0.1 0.0 - 0.1 K/uL   Immature Granulocytes 0 %   Abs Immature Granulocytes 0.04 0.00 - 0.07 K/uL  Basic metabolic panel  Result Value Ref Range   Sodium 137 135 - 145 mmol/L   Potassium 4.0 3.5 - 5.1 mmol/L   Chloride 95 (L) 98 - 111 mmol/L   CO2 33 (H) 22 - 32 mmol/L   Glucose, Bld 110 (H) 70 - 99 mg/dL   BUN 9 8 - 23 mg/dL    Creatinine, Ser 0.49 (L) 0.61 - 1.24 mg/dL   Calcium 8.9 8.9 - 10.3 mg/dL   GFR calc non Af Amer >60 >60 mL/min   GFR calc Af Amer >60 >60 mL/min   Anion gap 9 5 - 15  Troponin I - ONCE - STAT  Result Value Ref Range   Troponin I <0.03 <0.03 ng/mL  Lactic acid, plasma  Result Value Ref Range   Lactic Acid, Venous 0.8 0.5 - 1.9 mmol/L  HIV antibody (Routine Testing)  Result Value Ref Range   HIV Screen 4th Generation wRfx Non Reactive Non Reactive  Procalcitonin - Baseline  Result Value Ref Range   Procalcitonin <0.10 ng/mL      Assessment & Plan:   Problem List Items Addressed This Visit      Cardiovascular and Mediastinum   Senile purpura (  [place])    In presence of severe COPD and protein calorie malnutrition.  Discussed proper skin care, with gentle cleansing and use of Lubriderm lotion daily.  Monitor for skin breakdown and alert provider if present.        Respiratory   Chronic hypoxemic respiratory failure ([place])    O2 dependent with poor compliance leading to recent hospitalization.  Recommended use of O2 24 hours a day as recommended, at 2 L.  Continue to collaborate with pulmonology team.  Declines palliative consult at this time.      COPD exacerbation ([place]) - Primary    Acute, improving.  Continue abx and Prednisone to completion.  Utilize O2 as recommended.  Have recommended maintaining self quarantine at this time due to patient risk factors with current COVID19 pandemic.  CCM referral placed to assist in medication education and compliance discussions.  Repeat CBC and BMP today.      Relevant Medications   tiotropium (SPIRIVA) 18 MCG inhalation capsule   Other Relevant Orders   CBC with Differential/Platelet   Basic Metabolic Panel (BMET)   Ambulatory referral to Chronic Care Management Services     Other   Generalized anxiety disorder    Chronic, ongoing.  Refill on Klonopin sent.  Refuses alternate options, such as SSRI or Buspar.  Pt is aware of risks of  psychoactive medication use to include increased sedation, respiratory suppression, falls, dependence and cardiovascular events.  Pt would like to continue treatment as benefit determined to outweigh risk.        Nicotine dependence, cigarettes, uncomplicated    I have recommended complete cessation of tobacco use. I have discussed various options available for assistance with tobacco cessation including over the counter methods (Nicotine gum, patch and lozenges). We also discussed prescription options (Chantix, Nicotine Inhaler / Nasal Spray). The patient is to continue Nicotine patch at this time.      Lung nodule < 6cm on CT    Discussed with patient at length.  Order placed for repeat CT scan in 12 months as recommended.      Relevant Orders   CT CHEST LUNG CA SCREEN LOW DOSE W/O CM   Protein-calorie malnutrition ([facility])    Secondary to severe COPD.  Discussed benefit of supplement protein drink 2-3 times a day.  Will place CCM referral to see if assistance available to obtain for patient.  Continue to monitor weights at each visit and recommend three meals a day with snacks.         Other Visit Diagnoses    Need for hepatitis C screening test       Relevant Orders   Hepatitis C antibody      Controlled substance.  Checked data base and no other refills of controlled substances noted, last refill 03/27/2018.   Follow up plan: Return in about 3 months (around 07/19/2018) for COPD and Mood.

## 2018-04-19 NOTE — Assessment & Plan Note (Signed)
Secondary to severe COPD.  Discussed benefit of supplement protein drink 2-3 times a day.  Will place CCM referral to see if assistance available to obtain for patient.  Continue to monitor weights at each visit and recommend three meals a day with snacks.

## 2018-04-19 NOTE — Assessment & Plan Note (Signed)
Discussed with patient at length.  Order placed for repeat CT scan in 12 months as recommended.

## 2018-04-19 NOTE — Assessment & Plan Note (Signed)
O2 dependent with poor compliance leading to recent hospitalization.  Recommended use of O2 24 hours a day as recommended, at 2 L.  Continue to collaborate with pulmonology team.  Declines palliative consult at this time.

## 2018-04-19 NOTE — Patient Instructions (Signed)
Pulmonary Nodule  A pulmonary nodule is tissue that has grown on your lung. A nodule may be cancer, but most nodules are not cancer.  Follow these instructions at home:     Take over-the-counter and prescription medicines only as told by your doctor.   Do not use any products that have nicotine or tobacco, such as cigarettes and e-cigarettes. If you need help quitting, ask your doctor.   Keep all follow-up visits as told by your doctor. This is important.  Contact a doctor if:   You have trouble breathing when doing activities.   You feel sick.   You feel more tired than normal.   You do not feel like eating.   You lose weight without trying.   You have chills.   You have night sweats.  Get help right away if:   You cannot catch your breath.   You start making whistling sounds when breathing (wheezing).   You cannot stop coughing.   You cough up blood.   You get dizzy.   You feel like you are going to pass out (faint).   You have sudden chest pain.   You have a fever or symptoms for more than 2-3 days.   You have a fever and your symptoms suddenly get worse.  Summary   A pulmonary nodule is tissue that has grown on your lung.   Most nodules are not cancer.   Your doctor will do tests to know what kind of nodule you have, and whether you need treatment for it.  This information is not intended to replace advice given to you by your health care provider. Make sure you discuss any questions you have with your health care provider.  Document Released: 02/06/2010 Document Revised: 02/03/2016 Document Reviewed: 02/03/2016  Elsevier Interactive Patient Education  2019 [employer]

## 2018-04-19 NOTE — Assessment & Plan Note (Addendum)
Acute, improving.  Continue abx and Prednisone to completion.  Utilize O2 as recommended.  Have recommended maintaining self quarantine at this time due to patient risk factors with current COVID19 pandemic.  CCM referral placed to assist in medication education and compliance discussions.  Repeat CBC and BMP today.

## 2018-04-19 NOTE — Assessment & Plan Note (Signed)
Chronic, ongoing.  Refill on Klonopin sent.  Refuses alternate options, such as SSRI or Buspar.  Pt is aware of risks of psychoactive medication use to include increased sedation, respiratory suppression, falls, dependence and cardiovascular events.  Pt would like to continue treatment as benefit determined to outweigh risk.

## 2018-04-19 NOTE — Assessment & Plan Note (Signed)
In presence of severe COPD and protein calorie malnutrition.  Discussed proper skin care, with gentle cleansing and use of Lubriderm lotion daily.  Monitor for skin breakdown and alert provider if present. 

## 2018-04-20 ENCOUNTER — Other Ambulatory Visit: Payer: Self-pay | Admitting: *Deleted

## 2018-04-20 DIAGNOSIS — R911 Solitary pulmonary nodule: Secondary | ICD-10-CM

## 2018-04-20 LAB — CBC WITH DIFFERENTIAL/PLATELET
Basophils Absolute: 0 10*3/uL (ref 0.0–0.2)
Basos: 0 %
EOS (ABSOLUTE): 0.2 10*3/uL (ref 0.0–0.4)
Eos: 2 %
Hematocrit: 43.2 % (ref 37.5–51.0)
Hemoglobin: 13.8 g/dL (ref 13.0–17.7)
Immature Grans (Abs): 0.1 10*3/uL (ref 0.0–0.1)
Immature Granulocytes: 1 %
Lymphocytes Absolute: 3.5 10*3/uL — ABNORMAL HIGH (ref 0.7–3.1)
Lymphs: 34 %
MCH: 28.7 pg (ref 26.6–33.0)
MCHC: 31.9 g/dL (ref 31.5–35.7)
MCV: 90 fL (ref 79–97)
Monocytes Absolute: 0.9 10*3/uL (ref 0.1–0.9)
Monocytes: 8 %
Neutrophils Absolute: 5.7 10*3/uL (ref 1.4–7.0)
Neutrophils: 55 %
Platelets: 489 10*3/uL — ABNORMAL HIGH (ref 150–450)
RBC: 4.81 x10E6/uL (ref 4.14–5.80)
RDW: 12.9 % (ref 11.6–15.4)
WBC: 10.4 10*3/uL (ref 3.4–10.8)

## 2018-04-20 LAB — BASIC METABOLIC PANEL
BUN/Creatinine Ratio: 20 (ref 10–24)
BUN: 12 mg/dL (ref 8–27)
CO2: 35 mmol/L — ABNORMAL HIGH (ref 20–29)
Calcium: 9.4 mg/dL (ref 8.6–10.2)
Chloride: 92 mmol/L — ABNORMAL LOW (ref 96–106)
Creatinine, Ser: 0.61 mg/dL — ABNORMAL LOW (ref 0.76–1.27)
GFR calc Af Amer: 121 mL/min/{1.73_m2} (ref >59)
GFR calc non Af Amer: 105 mL/min/{1.73_m2} (ref >59)
Glucose: 76 mg/dL (ref 65–99)
Potassium: 4.6 mmol/L (ref 3.5–5.2)
Sodium: 138 mmol/L (ref 134–144)

## 2018-04-20 LAB — CULTURE, BLOOD (ROUTINE X 2): Culture: NO GROWTH

## 2018-04-21 ENCOUNTER — Ambulatory Visit: Payer: Self-pay | Admitting: *Deleted

## 2018-04-21 ENCOUNTER — Telehealth: Payer: Self-pay | Admitting: Nurse Practitioner

## 2018-04-21 DIAGNOSIS — E441 Mild protein-calorie malnutrition: Secondary | ICD-10-CM

## 2018-04-21 DIAGNOSIS — F17210 Nicotine dependence, cigarettes, uncomplicated: Secondary | ICD-10-CM

## 2018-04-21 DIAGNOSIS — J9611 Chronic respiratory failure with hypoxia: Secondary | ICD-10-CM

## 2018-04-21 DIAGNOSIS — R768 Other specified abnormal immunological findings in serum: Secondary | ICD-10-CM

## 2018-04-21 DIAGNOSIS — J449 Chronic obstructive pulmonary disease, unspecified: Secondary | ICD-10-CM

## 2018-04-21 DIAGNOSIS — K739 Chronic hepatitis, unspecified: Secondary | ICD-10-CM | POA: Insufficient documentation

## 2018-04-21 LAB — HEPATITIS C ANTIBODY: Hep C Virus Ab: >11 s/co ratio — ABNORMAL HIGH (ref 0.0–0.9)

## 2018-04-21 LAB — SPECIMEN STATUS REPORT

## 2018-04-21 NOTE — Telephone Encounter (Signed)
Discussed Hep C antibody testing being positive with patient.  He does endorses "wild days" in his past and h/o IV drug use in his younger years.  States "I wish I had gotten this testing before, but I didn't". Discussed that a GI referral would be placed to allow for further work-up.  Discussed that although antibody is present the next step is further work-up to see if it is active or gone.  He does endorse that for "many years" he has had occasional stomach pains, but reports "I just chalked that up to my prostate cancer".  He stated appreciation for call and agrees to GI referral for further work-up.

## 2018-04-21 NOTE — Patient Instructions (Signed)
Visit Information  For information about COVID-19 or "Corona Virus", the following web resources may be helpful:  CDC: BeginnerSteps.be    [place]:  [email or web address].[email or web address]   Goals Addressed            This Visit's Progress   . "I want to make sure about this medicine"  (pt-stated)       Current Barriers:  [name] Knowledge Deficits related to medication management . Non-adherence to prescribed medication regimen - as identified by PCP, "not taking Anoro, reports it did not work, and is taking Combivent and Spiriva at this time....."   Nurse Case Manager Clinical Goal(s):  [name] Over the next 14 days, patient will work with pharmacist to address needs related to medication management  Interventions:  . Evaluation of current treatment plan related to COPD  and patient's adherence to plan as established by provider. . Advised patient to take all medications as prescribed and call provider office with questions or concerns or difficulty with affording or managing medications . Collaborated with [facility] Pharmacist  regarding medication management needs  Patient Self Care Activities:  . Currently UNABLE TO independently manage medications as prescribed . Calls pharmacy for medication refills . Performs ADL's independently . Performs IADL's independently . Calls provider office for new concerns or questions  Initial goal documentation     . "I want to take care of my breathing" (pt-stated)       Current Barriers:  [name] Knowledge Deficits related to long term plan for self health management of COPD - Admitted to hospital from 04/14/2018 to 04/16/2018 for COPD acute exacerbation; followed by pulmonology (last appt 02/16/2018)  . Non-adherence to prescribed medication regimen - per provider report, patient does not consistently demonstrate adherence to prescribed  medication regimen  Nurse Case Manager Clinical Goal(s):  [name] Over the next 30 days, patient will verbalize understanding of plan for management of COPD . Over the next 30 days, patient will work with pharmacist to address needs related to medication management . Over the next 90 days, patient will demonstrate a decrease in COPD exacerbations as evidenced by patient self care activities demonstrating understanding of self management knowledge, no ED visits, no hospital admissions for COPD  . Over the next 90 days, patient will attend all scheduled medical appointments  Interventions:  . Evaluation of current treatment plan related to COPD management and patient's adherence to plan as established by provider. . Provided general education about COPD disease process and progressive nature of disease . Provided education re: symptom management . Provided [place] education . Advised patient to take all medications as prescribed . Advised to use O2 continuously as prescribed . Advised to adhere to scheduled provider appointments including:  . Advised to call provider office with new or worsened symptoms related to COPD . Provided education to patient re:  for EARLY signs/symptoms of COPD exacerbation; COVID19 precautions . Collaborated with [facility] PharmD and LCSW regarding patient current status and need for collaborative CM support . Discussed plans with patient for ongoing care management follow up and provided patient with direct contact information for care management team  Patient Self Care Activities:  . Currently UNABLE TO independently self manage COPD . Calls pharmacy for medication refills . Performs ADL's independently . Performs IADL's independently . Calls provider office for new concerns or questions  Initial goal documentation      . "I'd like to have my own place to stay" (pt-stated)       Current Barriers:  .  Knowledge Deficits related to housing options/community  resources . Financial Constraints - during his most recent PCP visit, he voiced concerns about his housing and finances (see provider note 04/19/18 for details); currently lives with two friends   Nurse Case Manager Clinical Goal(s):  [name] Over the next 30 days, patient will work with [facility] LCSW to address needs related to housing and financial barriers  Interventions:  . Collaborated with LCSW regarding housing/financial barriers  Patient Self Care Activities:  . Currently UNABLE TO independently find affordable housing . Performs ADL's independently . Performs IADL's independently . Calls provider office for new concerns or questions  Initial goal documentation        Mr. Lardizabal was given information about Chronic Care Management services today including:  1. CCM service includes personalized support from designated clinical staff supervised by his physician, including individualized plan of care and coordination with other care providers 2. 24/7 contact phone numbers for assistance for urgent and routine care needs. 3. Service will only be billed when office clinical staff spend 20 minutes or more in a month to coordinate care. 4. Only one practitioner may furnish and bill the service in a calendar month. 5. The patient may stop CCM services at any time (effective at the end of the month) by phone call to the office staff. 6. The patient will be responsible for cost sharing (co-pay) of up to 20% of the service fee (after annual deductible is met).  Patient agreed to services and verbal consent obtained.   The patient verbalized understanding of instructions provided today and declined a print copy of patient instruction materials.   The CM team will reach out to the patient again over the next 7 days.   Janalyn Shy MHA,BSN,RN,CCM Nurse Care Coordinator [facility] / [facility] Care Management [phone]

## 2018-04-21 NOTE — Chronic Care Management (AMB) (Signed)
Chronic Care Management   Initial Visit Note  04/21/2018 Name: Kevin Nelson MRN: [id number] DOB: 10-26-1950  Referred by: [name], NP Reason for referral : Chronic Care Management (New referral COPD)   Kevin Nelson is a 68 y.o. year old male who is a primary care patient of Cannady, [name], NP. The CCM team was consulted for assistance with chronic disease management and care coordination needs.   Review of patient status, including review of consultants reports, relevant laboratory and other test results, and collaboration with appropriate care team members and the patient's provider was performed as part of comprehensive patient evaluation and provision of chronic care management services.    SDOH (Social Determinants of Health) screening performed today. See Care Plan Entry related to challenges with: Housing  Financial Strain   Objective:   Goals Addressed            This Visit's Progress   . "I want to make sure about this medicine"  (pt-stated)       Current Barriers:  [name] Knowledge Deficits related to medication management . Non-adherence to prescribed medication regimen - as identified by PCP, "not taking Anoro, reports it did not work, and is taking Combivent and Spiriva at this time....."   Nurse Case Manager Clinical Goal(s):  [name] Over the next 14 days, patient will work with pharmacist to address needs related to medication management  Interventions:  . Evaluation of current treatment plan related to COPD  and patient's adherence to plan as established by provider. . Advised patient to take all medications as prescribed and call provider office with questions or concerns or difficulty with affording or managing medications . Collaborated with [facility] Pharmacist  regarding medication management needs  Patient Self Care Activities:  . Currently UNABLE TO independently manage medications as prescribed . Calls pharmacy for medication refills . Performs ADL's  independently . Performs IADL's independently . Calls provider office for new concerns or questions  Initial goal documentation   . "I want to take care of my breathing" (pt-stated)       Current Barriers:  [name] Knowledge Deficits related to long term plan for self health management of COPD - Admitted to hospital from 04/14/2018 to 04/16/2018 for COPD acute exacerbation; followed by pulmonology (last appt 02/16/2018)  . Non-adherence to prescribed medication regimen - per provider report, patient does not consistently demonstrate adherence to prescribed medication regimen  Nurse Case Manager Clinical Goal(s):  [name] Over the next 30 days, patient will verbalize understanding of plan for management of COPD . Over the next 30 days, patient will work with pharmacist to address needs related to medication management . Over the next 90 days, patient will demonstrate a decrease in COPD exacerbations as evidenced by patient self care activities demonstrating understanding of self management knowledge, no ED visits, no hospital admissions for COPD  . Over the next 90 days, patient will attend all scheduled medical appointments  Interventions:  . Evaluation of current treatment plan related to COPD management and patient's adherence to plan as established by provider. . Provided general education about COPD disease process and progressive nature of disease . Provided education re: symptom management . Provided [place] education . Advised patient to take all medications as prescribed . Advised to use O2 continuously as prescribed . Advised to adhere to scheduled provider appointments including:  . Advised to call provider office with new or worsened symptoms related to COPD . Provided education to patient re:  for EARLY  signs/symptoms of COPD exacerbation; COVID19 precautions . Collaborated with [facility] PharmD and LCSW regarding patient current status and need for collaborative CM support . Discussed plans  with patient for ongoing care management follow up and provided patient with direct contact information for care management team  Patient Self Care Activities:  . Currently UNABLE TO independently self manage COPD . Calls pharmacy for medication refills . Performs ADL's independently . Performs IADL's independently . Calls provider office for new concerns or questions  Initial goal documentation    . "I'd like to have my own place to stay" (pt-stated)       Current Barriers:  [name] Knowledge Deficits related to housing options/community resources . Financial Constraints - during his most recent PCP visit, he voiced concerns about his housing and finances (see provider note 04/19/18 for details); currently lives with two friends   Nurse Case Manager Clinical Goal(s):  [name] Over the next 30 days, patient will work with [facility] LCSW to address needs related to housing and financial barriers  Interventions:  . Collaborated with LCSW regarding housing/financial barriers  Patient Self Care Activities:  . Currently UNABLE TO independently find affordable housing . Performs ADL's independently . Performs IADL's independently . Calls provider office for new concerns or questions  Initial goal documentation         Kevin Nelson was given information about Chronic Care Management services today including:  1. CCM service includes personalized support from designated clinical staff supervised by his physician, including individualized plan of care and coordination with other care providers 2. 24/7 contact phone numbers for assistance for urgent and routine care needs. 3. Service will only be billed when office clinical staff spend 20 minutes or more in a month to coordinate care. 4. Only one practitioner may furnish and bill the service in a calendar month. 5. The patient may stop CCM services at any time (effective at the end of the month) by phone call to the office staff. 6. The patient will be  responsible for cost sharing (co-pay) of up to 20% of the service fee (after annual deductible is met).  Patient agreed to services and verbal consent obtained.   The CM team will reach out to the patient again over the next 7 days.  The patient has been provided with contact information for the chronic care management team and has been advised to call with any health related questions or concerns.   Janalyn Shy MHA,BSN,RN,CCM Nurse Care Coordinator [facility] Care Management [phone]

## 2018-04-26 ENCOUNTER — Telehealth: Payer: Self-pay

## 2018-04-26 ENCOUNTER — Ambulatory Visit (INDEPENDENT_AMBULATORY_CARE_PROVIDER_SITE_OTHER): Payer: Medicare Other | Admitting: Pharmacist

## 2018-04-26 DIAGNOSIS — J449 Chronic obstructive pulmonary disease, unspecified: Secondary | ICD-10-CM | POA: Diagnosis not present

## 2018-04-26 DIAGNOSIS — F17210 Nicotine dependence, cigarettes, uncomplicated: Secondary | ICD-10-CM

## 2018-04-26 NOTE — Chronic Care Management (AMB) (Signed)
Chronic Care Management   Follow Up Note   04/26/2018 Name: Kevin Nelson MRN: [id number] DOB: [birth year]  Referred by: [name], NP Reason for referral : Chronic Care Management (COPD)   Kevin Nelson is a 68 y.o. year old male who is a primary care patient of Cannady, [name], NP. The CCM team was consulted for assistance with chronic disease management and care coordination needs.    Contacted patient telephonically to discuss current medications.   Review of patient status, including review of consultants reports, relevant laboratory and other test results, and collaboration with appropriate care team members and the patient's provider was performed as part of comprehensive patient evaluation and provision of chronic care management services.    Goals Addressed            This Visit's Progress     Patient Stated   . "I want to make sure about this medicine"  (pt-stated)       Current Barriers:  . Confusion regarding current medication regimen:  o Patient was prescribed Anoro (umeclidinium/vilaneterol) at hospital discharge, but notes that this didn't seem to help much. It could be that patient does not have the inspiratory capacity to effectively utilize dry powder inhalers o Patient reported taking Combivent Respimat (ipratropium/albuterol) and Spiriva Respimat (tiotropium), however, upon medication review, he is actually taking Stiolo Respimat (tiotropium/olodaterol) daily, not Spiriva o Reports using albuterol nebulizer BID-TID PRN, and this is a significant improvement since patient has started using oxygen therapy  Pharmacist Clinical Goal(s):  [name] Over the next 30 days, patient will work with PharmD and PCP for optimized medication management  Interventions:   At this stage of COPD, patient most likely to benefit from Respimat formulations or nebulized medications. Identified that patient is taking Stiolto (LABA/LAMA) daily with Combivent (SABA/SAMA) BID as needed  and additional albuterol use PRN, though, notes that breathing is improved since quitting smoking.   Ideally, patient would not need Combivent in addition to [place], but it is unlikely that the patient would agree to discontinue Combivent at this time. Continue to monitor for any s/sx SAMA/SABA over dose (tachycardia, anticholinergic side effects)  May eventually need to transition to nebulized Duoneb instead of Stiolto/Combivent as breathing declines.   Patient Self Care Activities:  . Current takes medications and denies problems affording medications  Please see past updates related to this goal by clicking on the "Past Updates" button in the selected goal      . "I want to take care of my breathing" (pt-stated)       Current Barriers:  . Hx smoking cessation - patient notes that he has been tobacco-free for 3 weeks by using Nicoderm 21 mg/day patch. Denies skin side effects, insomnia  Pharmacist Clinical Goal(s):  [name] Over the next 60 days, patient will collaborate with PharmD and PCP for optimized management of nicotine replacement therapy to remain tobacco-free  Interventions:  . Provided motivational interviewing to patient, congratulated on his success. Educated that there will be a taper approach to Nicoderm patches:  o 21 mg patch x 6 weeks total, then (~ week of 5/8) o 14 mg patch x 2 weeks, then (~ week of 5/22) o 7 mg patch x 2 weeks, then stop  Patient Self Care Activities:  . Patient will continue to use a nicotine patch daily to avoid rebound tobacco use  Please see past updates related to this goal by clicking on the "Past Updates" button in the selected goal  Plan:  - PharmD will follow up in ~3-4 weeks, around the projected time of nicotine patch taper, to discuss medication management, breathing, tobacco cessation - CCM team will outreach patient within the week for continued care management  [name], PharmD Clinical Pharmacist [place] [phone]

## 2018-04-26 NOTE — Patient Instructions (Signed)
Visit Information  Goals Addressed            This Visit's Progress     Patient Stated   . "I want to make sure about this medicine"  (pt-stated)       Current Barriers:  . Confusion regarding current medication regimen:  o Patient was prescribed Anoro (umeclidinium/vilaneterol) at hospital discharge, but notes that this didn't seem to help much. It could be that patient does not have the inspiratory capacity to effectively utilize dry powder inhalers o Patient reported taking Combivent Respimat (ipratropium/albuterol) and Spiriva Respimat (tiotropium), however, upon medication review, he is actually taking Stiolo Respimat (tiotropium/olodaterol) daily, not Spiriva o Reports using albuterol nebulizer BID-TID PRN, and this is a significant improvement since patient has started using oxygen therapy  Pharmacist Clinical Goal(s):  [name] Over the next 30 days, patient will work with PharmD and PCP for optimized medication management  Interventions:   At this stage of COPD, patient most likely to benefit from Respimat formulations or nebulized medications. Identified that patient is taking Stiolto (LABA/LAMA) daily with Combivent (SABA/SAMA) BID as needed and additional albuterol use PRN, though, notes that breathing is improved since quitting smoking.   Ideally, patient would not need Combivent in addition to [place], but it is unlikely that the patient would agree to discontinue Combivent at this time. Continue to monitor for any s/sx SAMA/SABA over dose (tachycardia, anticholinergic side effects)  May eventually need to transition to nebulized Duoneb instead of Stiolto/Combivent as breathing declines.   Patient Self Care Activities:  . Current takes medications and denies problems affording medications  Please see past updates related to this goal by clicking on the "Past Updates" button in the selected goal      . "I want to take care of my breathing" (pt-stated)       Current Barriers:   . Hx smoking cessation - patient notes that he has been tobacco-free for 3 weeks by using Nicoderm 21 mg/day patch. Denies skin side effects, insomnia  Pharmacist Clinical Goal(s):  [name] Over the next 60 days, patient will collaborate with PharmD and PCP for optimized management of nicotine replacement therapy to remain tobacco-free  Interventions:  . Provided motivational interviewing to patient, congratulated on his success. Educated that there will be a taper approach to Nicoderm patches:  o 21 mg patch x 6 weeks total, then (~ week of 5/8) o 14 mg patch x 2 weeks, then (~ week of 5/22) o 7 mg patch x 2 weeks, then stop  Patient Self Care Activities:  . Patient will continue to use a nicotine patch daily to avoid rebound tobacco use  Please see past updates related to this goal by clicking on the "Past Updates" button in the selected goal          The patient verbalized understanding of instructions provided today and declined a print copy of patient instruction materials.   Plan:  - PharmD will follow up in ~3-4 weeks, around the projected time of nicotine patch taper, to discuss medication management, breathing, tobacco cessation - CCM team will outreach patient within the week for continued care management  [name], PharmD Clinical Pharmacist [place] [phone]

## 2018-04-27 ENCOUNTER — Other Ambulatory Visit: Payer: Self-pay | Admitting: Nurse Practitioner

## 2018-04-27 ENCOUNTER — Ambulatory Visit: Payer: Medicare Other | Admitting: Pharmacist

## 2018-04-27 DIAGNOSIS — J449 Chronic obstructive pulmonary disease, unspecified: Secondary | ICD-10-CM

## 2018-04-27 MED ORDER — TIOTROPIUM BROMIDE-OLODATEROL 2.5-2.5 MCG/ACT IN AERS: 2.0000 | INHALATION_SPRAY | Freq: Every day | RESPIRATORY_TRACT | 5 refills | Status: DC

## 2018-04-27 NOTE — Chronic Care Management (AMB) (Signed)
  Chronic Care Management   Follow Up Note   04/27/2018 Name: Kevin Nelson MRN: [id number] DOB: 08-18-1950  Referred by: [name], NP Reason for referral : Chronic Care Management (COPD)   Edmar Blankenburg is a 68 y.o. year old male who is a primary care patient of Cannady, [name], NP. The CCM team was consulted for assistance with chronic disease management and care coordination needs.    Patient contacted me needing a refill on his Stiolto inhaler.   Review of patient status, including review of consultants reports, relevant laboratory and other test results, and collaboration with appropriate care team members and the patient's provider was performed as part of comprehensive patient evaluation and provision of chronic care management services.    Goals Addressed            This Visit's Progress     Patient Stated   . "I want to make sure about this medicine"  (pt-stated)       Current Barriers:   Patient needs refill on Stiolto; notes that it was last prescribed by Dr. Alva Garnet, but that he doesn't have plans to follow up with this provider any time soon. He continues on Combivent and albuterol  Pharmacist Clinical Goal(s):  [name] Over the next 30 days, patient will work with PharmD and PCP for optimized medication management  Interventions:   Will collaborate with PCP Marnee Guarneri, NP to have refill on Stiolto sent to [place]  Patient Self Care Activities:  . Current takes medications and denies problems affording medications . Patient does not appropriately contact providers office for refills  Please see past updates related to this goal by clicking on the "Past Updates" button in the selected goal         Plan: - PharmD will collaborate with PCP. Will outreach patient in the next 3 weeks.   [name], PharmD Clinical Pharmacist [place] [phone]

## 2018-04-27 NOTE — Patient Instructions (Signed)
Visit Information  Goals Addressed            This Visit's Progress     Patient Stated   . "I want to make sure about this medicine"  (pt-stated)       Current Barriers:   Patient needs refill on Stiolto; notes that it was last prescribed by Dr. Alva Garnet, but that he doesn't have plans to follow up with this provider any time soon. He continues on Combivent and albuterol  Pharmacist Clinical Goal(s):  [name] Over the next 30 days, patient will work with PharmD and PCP for optimized medication management  Interventions:   Will collaborate with PCP Marnee Guarneri, NP to have refill on Stiolto sent to [place]  Patient Self Care Activities:  . Current takes medications and denies problems affording medications . Patient does not appropriately contact providers office for refills  Please see past updates related to this goal by clicking on the "Past Updates" button in the selected goal         The patient verbalized understanding of instructions provided today and declined a print copy of patient instruction materials.   Plan: - PharmD will collaborate with PCP. Will outreach patient in the next 3 weeks.   [name], PharmD Clinical Pharmacist [place] [phone]

## 2018-04-27 NOTE — Progress Notes (Signed)
Stiolto refill sent

## 2018-04-28 ENCOUNTER — Telehealth: Payer: Self-pay

## 2018-05-01 ENCOUNTER — Telehealth: Payer: Self-pay

## 2018-05-01 ENCOUNTER — Ambulatory Visit: Payer: Medicare Other | Admitting: Nurse Practitioner

## 2018-05-01 NOTE — Telephone Encounter (Signed)
PA for stiolto has been approved. Dates: 05/01/2018-01/18/2019 PA Case: [id number]

## 2018-05-01 NOTE — Telephone Encounter (Signed)
PA for Stiolto initiated and submitted via Cover My Meds. Key: [id number]

## 2018-05-02 ENCOUNTER — Encounter: Payer: Self-pay | Admitting: Gastroenterology

## 2018-05-02 ENCOUNTER — Other Ambulatory Visit: Payer: Self-pay

## 2018-05-02 ENCOUNTER — Ambulatory Visit (INDEPENDENT_AMBULATORY_CARE_PROVIDER_SITE_OTHER): Payer: Medicare Other | Admitting: Gastroenterology

## 2018-05-02 DIAGNOSIS — R768 Other specified abnormal immunological findings in serum: Secondary | ICD-10-CM | POA: Diagnosis not present

## 2018-05-02 NOTE — Progress Notes (Signed)
Kevin Lame, MD [address]  [place]  [place], [place] [address]  Main: [phone]  Fax: [phone]    Gastroenterology Virtual/Video Visit  Referring Provider:     [name], NP Primary Care Physician:  [name], NP Primary Gastroenterologist:  Dr.Keara Pagliarulo Allen Norris Reason for Consultation:     Positive hepatitis C antibody        HPI:    Virtual Visit via Video Note Location of the patient: Home Location of provider: Office  Participating persons: The patient myself and Ginger Feldpausch.  I connected with [name] on 05/02/18 at  9:30 AM EDT by a video enabled telemedicine application and verified that I am speaking with the correct person using two identifiers.   I discussed the limitations of evaluation and management by telemedicine and the availability of in person appointments. The patient expressed understanding and agreed to proceed.  Verbal consent to proceed obtained.  History of Present Illness: Kevin Nelson is a 68 y.o. male referred by Dr. [name], NP  for consultation & management of positive hepatitis C antibody.  This patient comes in today after being found to have a hepatitis C antibody at his primary care physician's practice.  The patient's other blood work which included a CBC and BMP not show any thing worrisome.  I do not see a liver panel on the patient's chart.  The patient has a history of COPD and has a history of tobacco use.  The patient has been admitted to the hospital in the past with COPD exacerbation.  Past Medical History:  Diagnosis Date   Basal cell carcinoma    End stage COPD ([place])    History of nonmelanoma skin cancer    Prostate cancer ([place])    Squamous cell skin cancer     Past Surgical History:  Procedure Laterality Date   CHOLECYSTECTOMY     FACIAL LACERATION REPAIR     SKIN CANCER EXCISION      Prior to Admission medications   Medication Sig Start Date End Date Taking?  Authorizing Provider  albuterol (PROVENTIL) (2.5 MG/3ML) 0.083% nebulizer solution Take 3 mLs (2.5 mg total) by nebulization every 6 (six) hours as needed for wheezing or shortness of breath. 01/27/18   Cannady, Henrine Screws T, NP  clonazePAM (KLONOPIN) 0.5 MG tablet Take 1 tablet (0.5 mg total) by mouth 2 (two) times daily as needed for anxiety. 04/19/18   Cannady, Henrine Screws T, NP  Ipratropium-Albuterol (COMBIVENT RESPIMAT) 20-100 MCG/ACT AERS respimat Inhale 1 puff into the lungs every 6 (six) hours as needed. 01/27/18   Cannady, Henrine Screws T, NP  leuprolide (LUPRON) 30 MG injection Inject 30 mg into the muscle every 6 (six) months.    [provider]  nicotine (NICODERM CQ - DOSED IN MG/24 HOURS) 21 mg/24hr patch Place 1 patch (21 mg total) onto the skin daily. 04/17/18   Bettey Costa, MD  Tiotropium Bromide-Olodaterol (STIOLTO RESPIMAT) 2.5-2.5 MCG/ACT AERS Inhale 2 puffs into the lungs daily. 04/27/18   [name], NP    Family History  Problem Relation Age of Onset   Cancer Sister    Cancer - Cervical Mother    Heart attack Brother    Heart attack Maternal Grandfather      Social History   Tobacco Use   Smoking status: Former Smoker    Packs/day: 2.00    Years: 51.00    Pack years: 102.00    Types: Cigarettes    Last  attempt to quit: 04/14/2018    Years since quitting: 0.0   Smokeless tobacco: Never Used  Substance Use Topics   Alcohol use: Yes    Alcohol/week: 3.0 standard drinks    Types: 3 Cans of beer per week    Comment: last drink 04/13/18   Drug use: Never    Allergies as of 05/02/2018   (No Known Allergies)    Review of Systems:    All systems reviewed and negative except where noted in HPI.   Observations/Objective:  Labs: CBC    Component Value Date/Time   WBC 10.4 04/19/2018 1456   WBC 11.7 (H) 04/14/2018 2242   RBC 4.81 04/19/2018 1456   RBC 4.44 04/14/2018 2242   HGB 13.8 04/19/2018 1456   HCT 43.2 04/19/2018 1456   PLT 489 (H) 04/19/2018  1456   MCV 90 04/19/2018 1456   MCH 28.7 04/19/2018 1456   MCH 29.1 04/14/2018 2242   MCHC 31.9 04/19/2018 1456   MCHC 32.1 04/14/2018 2242   RDW 12.9 04/19/2018 1456   LYMPHSABS 3.5 (H) 04/19/2018 1456   MONOABS 1.3 (H) 04/14/2018 2242   EOSABS 0.2 04/19/2018 1456   BASOSABS 0.0 04/19/2018 1456   CMP     Component Value Date/Time   NA 138 04/19/2018 1456   K 4.6 04/19/2018 1456   CL 92 (L) 04/19/2018 1456   CO2 35 (H) 04/19/2018 1456   GLUCOSE 76 04/19/2018 1456   GLUCOSE 110 (H) 04/14/2018 2242   BUN 12 04/19/2018 1456   CREATININE 0.61 (L) 04/19/2018 1456   CALCIUM 9.4 04/19/2018 1456   GFRNONAA 105 04/19/2018 1456   GFRAA 121 04/19/2018 1456    Imaging Studies: Ct Angio Chest Pe W And/or Wo Contrast  Result Date: 04/14/2018 CLINICAL DATA:  Shortness of breath pleurisy, dyspnea, recent hospital stay, eval for PE EXAM: CT ANGIOGRAPHY CHEST WITH CONTRAST TECHNIQUE: Multidetector CT imaging of the chest was performed using the standard protocol during bolus administration of intravenous contrast. Multiplanar CT image reconstructions and MIPs were obtained to evaluate the vascular anatomy. CONTRAST:  24mL OMNIPAQUE IOHEXOL 350 MG/ML SOLN COMPARISON:  Radiographs earlier this day. FINDINGS: Cardiovascular: There are no filling defects within the pulmonary arteries to suggest pulmonary embolus. Thoracic aorta is normal in caliber with moderate atherosclerosis. No dissection. Conventional branching pattern from the aortic arch. Heart is normal in size. No pericardial effusion. Mediastinum/Nodes: Prominent bilateral hilar lymph nodes, at 10 mm on the left and 12 mm on the right. No mediastinal adenopathy. The esophagus is decompressed. No visualized thyroid nodule. Lungs/Pleura: Emphysema with bronchial thickening. Retained mucus in the dependent trachea. No focal airspace disease. No pulmonary edema. No pleural fluid. 5 mm nodular opacity in the dependent left lower lobe image 89 series 6  is likely atelectasis but nonspecific. No pulmonary mass. Mild anterior right upper lobe scarring. Upper Abdomen: No acute abnormality. Musculoskeletal: Minor T3 compression fracture. There are no acute or suspicious osseous abnormalities. Review of the MIP images confirms the above findings. IMPRESSION: 1. No pulmonary embolus. 2. Emphysema with bronchial thickening. Retained mucus in the dependent trachea. 3. Prominent bilateral hilar lymph nodes are likely reactive. 4. 5 mm nodular focus in the dependent left lower lobe is indeterminate for nodule versus atelectasis. Given smoking history recommend follow-up non-contrast CT in 12 months. Aortic Atherosclerosis (ICD10-I70.0) and Emphysema (ICD10-J43.9). Electronically Signed   By: Keith Rake M.D.   On: 04/14/2018 23:50   Dg Chest Port 1 View  Result Date: 04/14/2018 CLINICAL DATA:  Increased shortness of breath today. History of COPD. EXAM: PORTABLE CHEST 1 VIEW COMPARISON:  02/14/2018 FINDINGS: Chronic hyperinflation. The cardiomediastinal contours are normal. Mild central bronchial thickening. Pulmonary vasculature is normal. No consolidation, pleural effusion, or pneumothorax. No acute osseous abnormalities are seen. IMPRESSION: Chronic hyperinflation with mild central bronchial thickening, imaging findings consistent with COPD. No localizing pulmonary process. Electronically Signed   By: Keith Rake M.D.   On: 04/14/2018 22:25    Assessment and Plan:   Hercules Hasler is a 68 y.o. y/o male has been referred for hepatitis C.  The patient states he used IV drugs and had a homemade tattoo done in the 82s.  The patient also suffers from COPD and is followed with pulmonology.  The patient has never been told he had hepatitis C until recently checked.  There is no report of any black stools or bloody stools.  He does report that he drinks 2 beers a day on average but used to drink much more heavily.  He also reports a chronic abdominal pain.  The  patient will be set up for lab work to look for abnormal liver enzymes immunity to hepatitis A and B and will be vaccinated accordingly.  The patient will also have a evaluation for the presence of fibrosis and/or cirrhosis prior to being treated.  He may also need an ultrasound that may need to wait until after the pandemic has resolved.  The patient has been explained the plan and agrees with it.  Follow Up Instructions:  I discussed the assessment and treatment plan with the patient. The patient was provided an opportunity to ask questions and all were answered. The patient agreed with the plan and demonstrated an understanding of the instructions.   The patient was advised to call back or seek an in-person evaluation if the symptoms worsen or if the condition fails to improve as anticipated.  I provided 15 minutes of non-face-to-face time during this encounter.   Kevin Lame, MD  Speech recognition software was used to dictate the above note.

## 2018-05-03 ENCOUNTER — Telehealth: Payer: Self-pay | Admitting: Nurse Practitioner

## 2018-05-03 ENCOUNTER — Telehealth: Payer: Self-pay

## 2018-05-03 NOTE — Telephone Encounter (Signed)
Copied from [place] [id number]. Topic: Referral - Question >> May 03, 2018 12:27 PM Parke Poisson wrote: Reason for CRM: Order for CT chest to f/u on lung nodule was placed. It looks like patient just had one done March 2020. Did you want this done now or was it placed for later in year ?

## 2018-05-03 NOTE — Telephone Encounter (Signed)
This is for one year lung check.  I let Kevin Nelson know and he was going to add to nodule clinic.

## 2018-05-05 ENCOUNTER — Telehealth: Payer: Self-pay

## 2018-05-05 ENCOUNTER — Ambulatory Visit: Payer: Self-pay | Admitting: Licensed Clinical Social Worker

## 2018-05-05 NOTE — Chronic Care Management (AMB) (Signed)
  Chronic Care Management    Clinical Social Work CCM Outreach Note  05/05/2018 Name: [name] MRN: [id number] DOB: October 04, 1950  Wolfgang Finigan is a 68 y.o. year old male who is a primary care patient of Cannady, [name], NP . The CCM team was consulted and LCSW reached out to [name] today by phone to introduce and offer CCM services but was unable to reach patient. LCSW was unable to leave a voice message as mailbox was full. LCSW will complete additional outreach attempts.   Follow Up Plan: SW will follow up with patient by phone over the next 3-4 weeks  Eula Fried, [place], MSW, [email or web address] Phone: [phone]

## 2018-05-08 ENCOUNTER — Telehealth: Payer: Self-pay | Admitting: Gastroenterology

## 2018-05-08 NOTE — Telephone Encounter (Signed)
Spoke with pt regarding the misunderstanding regarding his [place] and labs. Advised pt that in order to treat him for Hep C, we will need the additional labs Dr. Allen Norris has ordered. Pt verbalized understanding and will go tomorrow to have these done.

## 2018-05-08 NOTE — Telephone Encounter (Signed)
Patient called in about going to the hospital trying to get his liver u/s not yet scheduled. It was a misunderstanding about when to go to have it. I explained it had not been scheduled due to the COVID-19 restrictions and once they where lifted he would received a call. He also did not get the lab work that was ordered by Dr Allen Norris , stating he already had had lab work by another doctor.

## 2018-05-09 ENCOUNTER — Other Ambulatory Visit
Admission: RE | Admit: 2018-05-09 | Discharge: 2018-05-09 | Disposition: A | Discharge status: Home / Self Care | Payer: Medicare Other | Source: Ambulatory Visit | Attending: Gastroenterology | Admitting: Gastroenterology

## 2018-05-09 DIAGNOSIS — R768 Other specified abnormal immunological findings in serum: Secondary | ICD-10-CM | POA: Diagnosis not present

## 2018-05-09 LAB — HEPATIC FUNCTION PANEL
ALT: 16 U/L (ref 0–44)
AST: 20 U/L (ref 15–41)
Albumin: 3.9 g/dL (ref 3.5–5.0)
Alkaline Phosphatase: 57 U/L (ref 38–126)
Bilirubin, Direct: <0.1 mg/dL (ref 0.0–0.2)
Total Bilirubin: 0.8 mg/dL (ref 0.3–1.2)
Total Protein: 7 g/dL (ref 6.5–8.1)

## 2018-05-10 ENCOUNTER — Ambulatory Visit: Payer: Self-pay | Admitting: Licensed Clinical Social Worker

## 2018-05-10 DIAGNOSIS — R768 Other specified abnormal immunological findings in serum: Secondary | ICD-10-CM

## 2018-05-10 DIAGNOSIS — J449 Chronic obstructive pulmonary disease, unspecified: Secondary | ICD-10-CM

## 2018-05-10 DIAGNOSIS — Z1283 Encounter for screening for malignant neoplasm of skin: Secondary | ICD-10-CM | POA: Diagnosis not present

## 2018-05-10 DIAGNOSIS — D225 Melanocytic nevi of trunk: Secondary | ICD-10-CM | POA: Diagnosis not present

## 2018-05-10 DIAGNOSIS — L570 Actinic keratosis: Secondary | ICD-10-CM | POA: Diagnosis not present

## 2018-05-10 DIAGNOSIS — L720 Epidermal cyst: Secondary | ICD-10-CM | POA: Diagnosis not present

## 2018-05-10 DIAGNOSIS — D229 Melanocytic nevi, unspecified: Secondary | ICD-10-CM | POA: Diagnosis not present

## 2018-05-10 DIAGNOSIS — D2230 Melanocytic nevi of unspecified part of face: Secondary | ICD-10-CM | POA: Diagnosis not present

## 2018-05-10 DIAGNOSIS — D485 Neoplasm of uncertain behavior of skin: Secondary | ICD-10-CM | POA: Diagnosis not present

## 2018-05-10 LAB — CERULOPLASMIN: Ceruloplasmin: 23.7 mg/dL (ref 16.0–31.0)

## 2018-05-10 LAB — ALPHA-1-ANTITRYPSIN: A-1 Antitrypsin, Ser: 138 mg/dL (ref 101–187)

## 2018-05-10 LAB — MISC LABCORP TEST (SEND OUT): Labcorp test code: 6395

## 2018-05-10 LAB — HEPATITIS B SURFACE ANTIGEN: Hepatitis B Surface Ag: NEGATIVE

## 2018-05-10 LAB — ANA: Anti Nuclear Antibody (ANA): POSITIVE — AB

## 2018-05-10 LAB — MITOCHONDRIAL ANTIBODIES: Mitochondrial M2 Ab, IgG: 45.8 Units — ABNORMAL HIGH (ref 0.0–20.0)

## 2018-05-10 LAB — HEPATITIS A ANTIBODY, TOTAL: hep A Total Ab: NEGATIVE

## 2018-05-10 LAB — ANTI-SMOOTH MUSCLE ANTIBODY, IGG: F-Actin IgG: 10 Units (ref 0–19)

## 2018-05-10 NOTE — Patient Instructions (Signed)
Licensed Clinical Social Worker Visit Information  Thank you for speaking with me today! Please free feel to contact CCM team when needed.  Eula Fried, BSW, MSW, [facility] Care Management [email or web address] Phone: [phone]

## 2018-05-10 NOTE — Chronic Care Management (AMB) (Signed)
  Care Management Note   Kevin Nelson is a 68 y.o. year old male who is a primary care patient of Cannady, [name], NP. The CM team was consulted for assistance with chronic disease management and care coordination. Patient reports that he successfully attend OP visit yesterday and completed blood work which was needed in order to treat patient for Hep C.    I reached out to [name] by phone today.   [name] was given information about Chronic Care Management services today including:  1. CCM service includes personalized support from designated clinical staff supervised by his physician, including individualized plan of care and coordination with other care providers 2. 24/7 contact phone numbers for assistance for urgent and routine care needs. 3. Service will only be billed when office clinical staff spend 20 minutes or more in a month to coordinate care. 4. Only one practitioner may furnish and bill the service in a calendar month. 5. The patient may stop CCM services at any time (effective at the end of the month) by phone call to the office staff. 6. The patient will be responsible for cost sharing (co-pay) of up to 20% of the service fee (after annual deductible is met). 7.  Patient agreed to services and verbal consent obtained.    Review of patient status, including review of consultants reports, relevant laboratory and other test results, and collaboration with appropriate care team members and the patient's provider was performed as part of comprehensive patient evaluation and provision of chronic care management services.   Follow Up Plan: The CM team will reach out to the patient again over the next 7-21 days.   Eula Fried, BSW, MSW, [facility] Care [facility] [email or web address] Phone: [phone]

## 2018-05-11 ENCOUNTER — Telehealth: Payer: Self-pay

## 2018-05-11 LAB — HEPATITIS C GENOTYPE

## 2018-05-11 LAB — HCV RNA QUANT

## 2018-05-11 LAB — HCV RNA (INTERNATIONAL UNITS)
HCV RNA (International Units): 13700000 IU/mL
HCV log10: 7.137 log10 IU/mL

## 2018-05-16 ENCOUNTER — Telehealth: Payer: Self-pay

## 2018-05-17 ENCOUNTER — Ambulatory Visit: Payer: Self-pay | Admitting: Licensed Clinical Social Worker

## 2018-05-17 DIAGNOSIS — J449 Chronic obstructive pulmonary disease, unspecified: Secondary | ICD-10-CM

## 2018-05-17 DIAGNOSIS — R768 Other specified abnormal immunological findings in serum: Secondary | ICD-10-CM

## 2018-05-17 NOTE — Chronic Care Management (AMB) (Signed)
  Care Management Note   Kevin Nelson is a 68 y.o. year old male who is a primary care patient of Kevin Nelson, Kevin Faster, NP. The CM team was consulted for assistance with chronic disease management and care coordination.   I reached out to Kevin Nelson by phone today. Review of patient status, including review of consultants reports, relevant laboratory and other test results, and collaboration with appropriate care team members and the patient's provider was performed as part of comprehensive patient evaluation and provision of chronic care management services.   Goals Addressed    . "I'd like to have my own place to stay" (pt-stated)       Current Barriers:  [name] Knowledge Deficits related to housing options/community resources . Financial Constraints - during his most recent PCP visit, he voiced concerns about his housing and finances, currently lives with two friends (childhood friend)    Clinical Social Worker Clinical Goal(s):  [name] Over the next 90 days, patient will verbalize understanding of plan for gaining crisis support resource education . Over the next 90 days, patient will work with [facility] LCSW to address needs related to housing and financial barriers  Interventions: . Patient interviewed and appropriate assessments performed . Provided patient with information about low income housing resources that are available. Currently Section 8 and Public Housing wait list is closed . Discussed plans with patient for ongoing care management follow up and provided patient with direct contact information for care management team . Assisted patient/caregiver with obtaining information about health plan benefits  Patient Self Care Activities:  . Attends all scheduled provider appointments . Calls provider office for new concerns or questions  Please see past updates related to this goal by clicking on the "Past Updates" button in the selected goal     Follow Up Plan: The CM team will reach out  to the patient again over the next 30 days.   Kevin Nelson, BSW, MSW, [facility] Care [facility] [email or web address] Phone: [phone]

## 2018-05-23 ENCOUNTER — Ambulatory Visit: Payer: Medicare Other | Admitting: Pharmacist

## 2018-05-23 DIAGNOSIS — R768 Other specified abnormal immunological findings in serum: Secondary | ICD-10-CM

## 2018-05-23 DIAGNOSIS — J449 Chronic obstructive pulmonary disease, unspecified: Secondary | ICD-10-CM

## 2018-05-23 NOTE — Patient Instructions (Signed)
Visit Information  Goals Addressed            This Visit's Progress     Patient Stated   . "I want to take care of my breathing" (pt-stated)       Current Barriers:  . Hx smoking cessation - patient notes that he has been tobacco-free for ~5-6 weeks on Nicoderm CQ 21 mg patches  Pharmacist Clinical Goal(s):  [name] Over the next 60 days, patient will collaborate with PharmD and PCP for optimized management of nicotine replacement therapy to remain tobacco-free  Interventions:  . Provided motivational interviewing to patient, congratulated on his success.  . Recommend stepping down to 14 mg patch strength x 4-8 weeks. Would recommend a more prolonged taper due to recent stressors of moving, stressors related to recent diagnosis of Hepatitis C treatment.  . Also recommend providing nicotine gum 4 mg PRN breakthrough cravings   Patient Self Care Activities:  . Patient will continue to use a nicotine patch daily to avoid rebound tobacco use and nicotine gum for breakthrough cravings   Please see past updates related to this goal by clicking on the "Past Updates" button in the selected goal       . "I'm worried about my Hepatitis" (pt-stated)       Current Barriers:  [name] Knowledge Deficits related to plan for treatment: patient notes that he is supposed to receive an abdominal ultrasound in the near future, but this is being postponed due to coronavirus restrictions. He is concerned about progression of disease . Patient notes that he had labs checked on 05/09/2018, and is unsure of the results of these   Pharmacist Clinical Goal(s):  [name] Over the next 30 days, patient will work with CCM team to address needs related to optimized medication management  Interventions: . Reviewed lab results. Contacted Ginger Feldpausch, CMA with [facility], asking for clarification of next steps.  . Will relay plan to patient once received  Patient Self Care Activities:  . Self administers medications as  prescribed . Attends all scheduled provider appointments . Calls provider office for new concerns or questions  Initial goal documentation        The patient verbalized understanding of instructions provided today and declined a print copy of patient instruction materials.   Plan:  - Will follow up with  GI in 2-3 business days if I have not heard back  [name], PharmD Clinical Pharmacist [place] [phone]

## 2018-05-23 NOTE — Chronic Care Management (AMB) (Signed)
  Chronic Care Management   Follow Up Note   05/23/2018 Name: Kevin Nelson MRN: [id number] DOB: 03-28-1950  Referred by: Kevin Lick, NP Reason for referral : Chronic Care Management (Medication Management)   Kevin Nelson is a 68 y.o. year old male who is a primary care patient of Cannady, Kevin Faster, NP. The CCM team was consulted for assistance with chronic disease management and care coordination needs.    Contacted patient today for follow up of chronic medical conditions, including COPD, smoking cessation, and hepatitis C.  Review of patient status, including review of consultants reports, relevant laboratory and other test results, and collaboration with appropriate care team members and the patient's provider was performed as part of comprehensive patient evaluation and provision of chronic care management services.    Goals Addressed            This Visit's Progress     Patient Stated   . "I want to take care of my breathing" (pt-stated)       Current Barriers:  . Hx smoking cessation - patient notes that he has been tobacco-free for ~5-6 weeks on Nicoderm CQ 21 mg patches  Pharmacist Clinical Goal(s):  [name] Over the next 60 days, patient will collaborate with PharmD and PCP for optimized management of nicotine replacement therapy to remain tobacco-free  Interventions:  . Provided motivational interviewing to patient, congratulated on his success.  . Recommend stepping down to 14 mg patch strength x 4-8 weeks. Would recommend a more prolonged taper due to recent stressors of moving, stressors related to recent diagnosis of Hepatitis C treatment.  . Also recommend providing nicotine gum 4 mg PRN breakthrough cravings   Patient Self Care Activities:  . Patient will continue to use a nicotine patch daily to avoid rebound tobacco use and nicotine gum for breakthrough cravings   Please see past updates related to this goal by clicking on the "Past Updates" button in the  selected goal       . "I'm worried about my Hepatitis" (pt-stated)       Current Barriers:  [name] Knowledge Deficits related to plan for treatment: patient notes that he is supposed to receive an abdominal ultrasound in the near future, but this is being postponed due to coronavirus restrictions. He is concerned about progression of disease . Patient notes that he had labs checked on 05/09/2018, and is unsure of the results of these   Pharmacist Clinical Goal(s):  [name] Over the next 30 days, patient will work with CCM team to address needs related to optimized medication management  Interventions: . Reviewed lab results. Contacted Kevin Nelson, CMA with [facility], asking for clarification of next steps.  . Will relay plan to patient once received  Patient Self Care Activities:  . Self administers medications as prescribed . Attends all scheduled provider appointments . Calls provider office for new concerns or questions  Initial goal documentation        Plan:  - Will follow up with [facility] in 2-3 business days if I have not heard back  Kevin Nelson, PharmD Clinical Pharmacist [place] [phone]

## 2018-05-24 ENCOUNTER — Other Ambulatory Visit: Payer: Self-pay | Admitting: Nurse Practitioner

## 2018-05-24 ENCOUNTER — Telehealth: Payer: Self-pay | Admitting: Nurse Practitioner

## 2018-05-24 ENCOUNTER — Ambulatory Visit: Payer: Medicare Other | Admitting: *Deleted

## 2018-05-24 DIAGNOSIS — F17210 Nicotine dependence, cigarettes, uncomplicated: Secondary | ICD-10-CM

## 2018-05-24 DIAGNOSIS — J449 Chronic obstructive pulmonary disease, unspecified: Secondary | ICD-10-CM

## 2018-05-24 MED ORDER — CLONAZEPAM 0.5 MG PO TABS: 0.5000 mg | ORAL_TABLET | Freq: Two times a day (BID) | ORAL | 2 refills | Status: DC | PRN

## 2018-05-24 NOTE — Telephone Encounter (Signed)
Called pt, advised that meds have been sent in

## 2018-05-24 NOTE — Telephone Encounter (Signed)
Please alert patient that new script was sent and increased amount to 60 tablets per month.

## 2018-05-24 NOTE — Telephone Encounter (Signed)
Copied from [place] [id number]. Topic: Quick Communication - Rx Refill/Question >> May 24, 2018  2:49 PM Kevin Nelson wrote: Medication: clonazePAM (KLONOPIN) Pt called and is requesting refill. Pt states that he cannot go another night without sleep. He states that he receives 45 pills Nelson month, 2 pills Nelson day. He states that he never has enough to make it to the next refill. Please advise  Best Contact: [phone]  Has the patient contacted their pharmacy? No. (Agent: If no, request that the patient contact the pharmacy for the refill.) (Agent: If yes, when and what did the pharmacy advise?)  Preferred Pharmacy (with phone number or street name): [facility] [place] [facility] [place] [facility] [address] [place] [address] Phone: [phone] Fax: [phone]    Agent: Please be advised that RX refills may take up to 3 business days. We ask that you follow-up with your pharmacy.

## 2018-05-25 DIAGNOSIS — C44619 Basal cell carcinoma of skin of left upper limb, including shoulder: Secondary | ICD-10-CM | POA: Diagnosis not present

## 2018-05-25 DIAGNOSIS — C61 Malignant neoplasm of prostate: Secondary | ICD-10-CM | POA: Diagnosis not present

## 2018-05-25 DIAGNOSIS — J449 Chronic obstructive pulmonary disease, unspecified: Secondary | ICD-10-CM | POA: Diagnosis not present

## 2018-05-25 DIAGNOSIS — Z85828 Personal history of other malignant neoplasm of skin: Secondary | ICD-10-CM | POA: Diagnosis not present

## 2018-05-25 DIAGNOSIS — L578 Other skin changes due to chronic exposure to nonionizing radiation: Secondary | ICD-10-CM | POA: Diagnosis not present

## 2018-05-25 DIAGNOSIS — D485 Neoplasm of uncertain behavior of skin: Secondary | ICD-10-CM | POA: Diagnosis not present

## 2018-05-25 DIAGNOSIS — L570 Actinic keratosis: Secondary | ICD-10-CM | POA: Diagnosis not present

## 2018-05-25 DIAGNOSIS — C44519 Basal cell carcinoma of skin of other part of trunk: Secondary | ICD-10-CM | POA: Diagnosis not present

## 2018-05-25 DIAGNOSIS — B1920 Unspecified viral hepatitis C without hepatic coma: Secondary | ICD-10-CM | POA: Diagnosis not present

## 2018-05-25 NOTE — Chronic Care Management (AMB) (Signed)
  Chronic Care Management   Follow Up Note   05/25/2018 Name: Kevin Nelson MRN: [id number] DOB: [birth year]  Referred by: [name], NP Reason for referral : Chronic Care Management (COPD,) and Care Coordination (pharmacy)   Kevin Nelson is a 68 y.o. year old male who is a primary care patient of Cannady, [name], NP. The CCM team was consulted for assistance with chronic disease management and care coordination needs.    Review of patient status, including review of consultants reports, relevant laboratory and other test results, and collaboration with appropriate care team members and the patient's provider was performed as part of comprehensive patient evaluation and provision of chronic care management services.    Goals Addressed            This Visit's Progress   . COPD Management (pt-stated)       Current Barriers:  [name] Knowledge deficits related to basic COPD self care/management . Limited Social Support   Case Manager Clinical Goal(s):  Over the next 90 days, patient will be able to verbalize understanding of COPD action plan and when to seek appropriate levels of medical care  Over the next 90 days, patient will not be hospitalized for COPD exacerbation   Interventions:   Provided patient with basic verbal COPD education on self care/management/and exacerbation prevention   Assisted patient with finding out when medications would be ready to pick up at the pharmacy by calling [facility] for him  Reviewed smoking cessation efforts and congratulated patient on his success thus far  Discussed patient's concerns of care related to his other disease processes including Hep C and Prostate CA  Patient Self Care Activities:  Takes medications as prescribed including inhalers  Does not currently perform self care activities related to COPD management  Initial goal documentation         The CM team will reach out to the patient again over the next 14 days.  The  patient has been provided with contact information for the chronic care management team and has been advised to call with any health related questions or concerns.   Merlene Morse Mansfield Dann RN, BSN Nurse Case Editor, [facility]ing [facility]  [phone]) Business Mobile

## 2018-05-25 NOTE — Patient Instructions (Signed)
Thank you allowing the Chronic Care Management Team to be a part of your care! It was a pleasure speaking with you today!  CCM (Chronic Care Management) Team   Felicia Both RN, BSN Nurse Care Coordinator  [phone]  [facility] PharmD  Clinical Pharmacist  [phone]  Eula Fried LCSW Clinical Social Worker [phone]  Goals Addressed            This Visit's Progress   . COPD Management (pt-stated)       Current Barriers:  [name] Knowledge deficits related to basic COPD self care/management . Limited Social Support   Case Manager Clinical Goal(s):  Over the next 90 days, patient will be able to verbalize understanding of COPD action plan and when to seek appropriate levels of medical care  Over the next 90 days, patient will not be hospitalized for COPD exacerbation   Interventions:   Provided patient with basic verbal COPD education on self care/management/and exacerbation prevention   Assisted patient with finding out when medications would be ready to pick up at the pharmacy by calling [facility] for him  Reviewed smoking cessation efforts and congratulated patient on his success thus far  Discussed patient's concerns of care related to his other disease processes including Hep C and Prostate CA  Patient Self Care Activities:  Takes medications as prescribed including inhalers  Does not currently perform self care activities related to COPD management  Initial goal documentation        The patient verbalized understanding of instructions provided today and declined a print copy of patient instruction materials.   The CM team will reach out to the patient again over the next 14 days.  The patient has been provided with contact information for the chronic care management team and has been advised to call with any health related questions or concerns.

## 2018-05-26 ENCOUNTER — Ambulatory Visit: Payer: Medicare Other | Admitting: Pharmacist

## 2018-05-26 ENCOUNTER — Telehealth: Payer: Self-pay

## 2018-05-26 DIAGNOSIS — R768 Other specified abnormal immunological findings in serum: Secondary | ICD-10-CM

## 2018-05-26 NOTE — Patient Instructions (Signed)
Visit Information  Goals Addressed            This Visit's Progress     Patient Stated   . "I'm worried about my Hepatitis" (pt-stated)       Current Barriers:  [name] Knowledge Deficits related to plan for treatment: patient notes that he is supposed to receive an abdominal ultrasound in the near future, but this is being postponed due to coronavirus restrictions. He is concerned about progression of disease . Patient notes that he had labs checked on 05/09/2018, and is unsure of the results of these. Can see that Dr. Allen Norris recommended to start treatment when he reviewed labwork on 05/15/2018, but unsure if there are other steps needed to be completed by the patient prior to treatment. o I have not received any response/clarification from Dr. Dorothey Baseman CMA   Pharmacist Clinical Goal(s):  [name] Over the next 30 days, patient will work with CCM team to address needs related to optimized medication management  Interventions: . Contacted [facility]; was unable to reach nursing staff line, so left message on mail line asking for a return call regarding this patient.   Patient Self Care Activities:  . Self administers medications as prescribed . Attends all scheduled provider appointments . Calls provider office for new concerns or questions  Please see past updates related to this goal by clicking on the "Past Updates" button in the selected goal         The patient verbalized understanding of instructions provided today and declined a print copy of patient instruction materials.    Plan:  - If I have not heard anything from the office by next week, will work with PCP to determine best next steps  [name], PharmD Clinical Pharmacist [place] [phone]

## 2018-05-26 NOTE — Chronic Care Management (AMB) (Signed)
  Chronic Care Management   Follow Up Note   05/26/2018 Name: Kevin Nelson MRN: [id number] DOB: July 13, 1950  Referred by: [name], NP Reason for referral : Chronic Care Management (Medication Management)   Kevin Nelson is a 68 y.o. year old male who is a primary care patient of Cannady, [name], NP. The CCM team was consulted for assistance with chronic disease management and care coordination needs.    Contacted [facility] to follow up on plan for Hepatitis treatment for the patient.   Review of patient status, including review of consultants reports, relevant laboratory and other test results, and collaboration with appropriate care team members and the patient's provider was performed as part of comprehensive patient evaluation and provision of chronic care management services.    Goals Addressed            This Visit's Progress     Patient Stated   . "I'm worried about my Hepatitis" (pt-stated)       Current Barriers:  [name] Knowledge Deficits related to plan for treatment: patient notes that he is supposed to receive an abdominal ultrasound in the near future, but this is being postponed due to coronavirus restrictions. He is concerned about progression of disease . Patient notes that he had labs checked on 05/09/2018, and is unsure of the results of these. Can see that Dr. Allen Norris recommended to start treatment when he reviewed labwork on 05/15/2018, but unsure if there are other steps needed to be completed by the patient prior to treatment. o I have not received any response/clarification from Dr. Dorothey Baseman CMA   Pharmacist Clinical Goal(s):  [name] Over the next 30 days, patient will work with CCM team to address needs related to optimized medication management  Interventions: . Contacted [facility]; was unable to reach nursing staff line, so left message on mail line asking for a return call regarding this patient.   Patient Self Care Activities:  . Self administers medications  as prescribed . Attends all scheduled provider appointments . Calls provider office for new concerns or questions  Please see past updates related to this goal by clicking on the "Past Updates" button in the selected goal          Plan:  - If I have not heard anything from the office by next week, will work with PCP to determine best next steps  [name], PharmD Clinical Pharmacist [place] [phone]

## 2018-05-29 ENCOUNTER — Encounter: Payer: Self-pay | Admitting: Oncology

## 2018-05-29 ENCOUNTER — Telehealth: Payer: Self-pay | Admitting: Gastroenterology

## 2018-05-29 DIAGNOSIS — R911 Solitary pulmonary nodule: Secondary | ICD-10-CM

## 2018-05-29 NOTE — Progress Notes (Unsigned)
  Pulmonary Nodule Clinic Telephone Note  Received referral from PCP, Marnee Guarneri, NP.   Per most recent guidelines and recommendations from [place] (2017), this patient requires a CT scan without contrast, 12 months from last scan and follow-up visit in the pulmonary nodule clinic a few days later.    I have personally reviewed all patient's previous imaging. Last CT Angio chest PE scan completed on 04/14/18 revealed a 5 mm NEW nodular left lower lobe.  Previous imaging from January and March 2020 (Chest X-ray) revealed COPD. No additional imaging is avilable for preview. Per Fleischner guidelines (2017), CT scan without contrast is recommended in 12 months and if stable repeat CT scan at 18 to 24 months (from first scan) to ensure stability in high risk patients.  High risk factors include: History of heavy smoking, exposure to asbestos, radium or uranium, personal family history of lung cancer, older age, sex (females greater than males), race (black and native [place] greater than weight), marginal speculation, upper lobe location, multiplicity (less than 5 nodules increases risk for malignancy) and emphysema and/or pulmonary fibrosis.   This recommendation follows the consensus statement: Guidelines for Management of Incidental Pulmonary Nodules Detected on CT Images: From the Fleischner Society 2017; Radiology 2017; 284:228-243.    I have placed order for CT scan without contrast to be completed approximately 12 months from previous CT scan.  I would like him to see me in our Pulmonary Nodule Clinic after  CT scan on scheduled clinic day (Friday Morning).   Faythe Casa, NP 05/29/2018 12:06 PM

## 2018-05-29 NOTE — Telephone Encounter (Signed)
Katie a Software engineer from [facility] left vm to check on plan of care for pt please call her on direct line [phone]

## 2018-05-31 ENCOUNTER — Ambulatory Visit: Payer: Self-pay | Admitting: Licensed Clinical Social Worker

## 2018-05-31 ENCOUNTER — Telehealth: Payer: Self-pay

## 2018-05-31 ENCOUNTER — Telehealth: Payer: Self-pay | Admitting: Nurse Practitioner

## 2018-05-31 MED ORDER — TIOTROPIUM BROMIDE-OLODATEROL 2.5-2.5 MCG/ACT IN AERS: 2.0000 | INHALATION_SPRAY | Freq: Every day | RESPIRATORY_TRACT | 5 refills | Status: DC

## 2018-05-31 MED ORDER — IPRATROPIUM-ALBUTEROL 20-100 MCG/ACT IN AERS: 1.0000 | INHALATION_SPRAY | Freq: Four times a day (QID) | RESPIRATORY_TRACT | 4 refills | Status: DC | PRN

## 2018-05-31 NOTE — Telephone Encounter (Signed)
Copied from [place] [id number]. Topic: Quick Communication - Rx Refill/Question >> May 31, 2018 12:17 PM Izola Price, [place] A wrote: Medication: Ipratropium-Albuterol (COMBIVENT RESPIMAT) 20-100 MCG/ACT AERS respimat ,albuterol (PROVENTIL) (2.5 MG/3ML) 0.083% nebulizer solution,Tiotropium Bromide-Olodaterol (STIOLTO RESPIMAT) 2.5-2.5 MCG/ACT AERS (Patient stated that he is out of refills)  Has the patient contacted their pharmacy? Yes (Agent: If no, request that the patient contact the pharmacy for the refill.) (Agent: If yes, when and what did the pharmacy advise?)Contact PCP  Preferred Pharmacy (with phone number or street name): [place], [place] [address] [facility] [phone] (Phone) [phone] (Fax)    Agent: Please be advised that RX refills may take up to 3 business days. We ask that you follow-up with your pharmacy.

## 2018-05-31 NOTE — Telephone Encounter (Signed)
Patient notified

## 2018-05-31 NOTE — Chronic Care Management (AMB) (Signed)
  Care Management Note   Kevin Nelson is a 68 y.o. year old male who is a primary care patient of Cannady, [name], NP . The CM team was consulted for assistance with [employer] and [place] and [employer].   Review of patient status, including review of consultants reports, rand collaboration with appropriate care team members and the patient's provider was performed as part of comprehensive patient evaluation and provision of chronic care management services. Telephone outreach to patient today to introduce CCM services.   I reached out to [name] by phone today but was unable to reach him successfully. HIPPA compliant voice message left encouraging patient to return call once available.   Follow Up Plan: SW will follow up with patient by phone over the next 2-4 weeks  Eula Fried, [place], MSW, [email or web address] Phone: [phone]

## 2018-06-01 ENCOUNTER — Telehealth: Payer: Self-pay | Admitting: *Deleted

## 2018-06-01 NOTE — Telephone Encounter (Signed)
Received referral for lung screening. Patient contacted and notified that he will be given an appt as soon as possible given covid 19 delay.

## 2018-06-02 ENCOUNTER — Telehealth: Payer: Self-pay

## 2018-06-05 ENCOUNTER — Other Ambulatory Visit: Payer: Self-pay | Admitting: Nurse Practitioner

## 2018-06-06 ENCOUNTER — Other Ambulatory Visit: Payer: Self-pay

## 2018-06-06 ENCOUNTER — Ambulatory Visit: Payer: Medicare Other | Admitting: Pharmacist

## 2018-06-06 ENCOUNTER — Telehealth: Payer: Self-pay | Admitting: Gastroenterology

## 2018-06-06 DIAGNOSIS — R768 Other specified abnormal immunological findings in serum: Secondary | ICD-10-CM

## 2018-06-06 NOTE — Patient Instructions (Signed)
Visit Information  Goals Addressed            This Visit's Progress     Patient Stated   . "I'm worried about my Hepatitis" (pt-stated)       Current Barriers:  [name] Knowledge Deficits related to plan for treatment: patient notes that he is supposed to receive an abdominal ultrasound in the near future, but this is being postponed due to coronavirus restrictions. He is concerned about progression of disease and plan of treatment  Pharmacist Clinical Goal(s):  [name] Over the next 30 days, patient will work with CCM team to address needs related to optimized medication management  Interventions:  Received call back from Ginger, CMA at [facility]. She noted that abdominal US with elastography is next step needed for staging of disease. They were awaiting lifted restrictions, and patient has been scheduled for this imaging on Friday.   Patient Self Care Activities:  . Self administers medications as prescribed . Attends all scheduled provider appointments . Calls provider office for new concerns or questions  Please see past updates related to this goal by clicking on the "Past Updates" button in the selected goal         The patient verbalized understanding of instructions provided today and declined a print copy of patient instruction materials.   Plan:  - CCM team will continue to follow and support the patient in management of chronic disease states  [name], PharmD Clinical Pharmacist [place] [phone]

## 2018-06-06 NOTE — Telephone Encounter (Signed)
Received PA for albuterol solution for nebulizer, however it's a covered medication and not an option in [employer].  Routing to pharmacist as Juluis Rainier if it's for financial assistance with medication.

## 2018-06-06 NOTE — Telephone Encounter (Signed)
Katie a pharmacist is calling she can see in the notes were it states start treatment and she is calling to find out what kind of treatment and what the Plan of care is for this pt cb [phone]

## 2018-06-06 NOTE — Chronic Care Management (AMB) (Signed)
  Chronic Care Management   Follow Up Note   06/06/2018 Name: Kevin Nelson MRN: [id number] DOB: February 06, 1950  Referred by: [name], NP Reason for referral : Chronic Care Management (Medication Management)   Kevin Nelson is a 68 y.o. year old male who is a primary care patient of Cannady, [name], NP. The CCM team was consulted for assistance with chronic disease management and care coordination needs.    Review of patient status, including review of consultants reports, relevant laboratory and other test results, and collaboration with appropriate care team members and the patient's provider was performed as part of comprehensive patient evaluation and provision of chronic care management services.    Goals Addressed            This Visit's Progress     Patient Stated   . "I'm worried about my Hepatitis" (pt-stated)       Current Barriers:  [name] Knowledge Deficits related to plan for treatment: patient notes that he is supposed to receive an abdominal ultrasound in the near future, but this is being postponed due to coronavirus restrictions. He is concerned about progression of disease and plan of treatment  Pharmacist Clinical Goal(s):  [name] Over the next 30 days, patient will work with CCM team to address needs related to optimized medication management  Interventions:  Received call back from Ginger, CMA at [facility]. She noted that abdominal US with elastography is next step needed for staging of disease. They were awaiting lifted restrictions, and patient has been scheduled for this imaging on Friday.   Patient Self Care Activities:  . Self administers medications as prescribed . Attends all scheduled provider appointments . Calls provider office for new concerns or questions  Please see past updates related to this goal by clicking on the "Past Updates" button in the selected goal          Plan:  - CCM team will continue to follow and support the patient in  management of chronic disease states  [name], PharmD Clinical Pharmacist [place] [phone]

## 2018-06-06 NOTE — Telephone Encounter (Signed)
Pt left vm he had a Virtual visit with Dr. Allen Norris and was told he would get scheduled for CT scan once they were back on for doing them he is very concerned and would like to have his Liver scan his Pulmonary Dr. Vonzella Nipple him a scan as well but was told to have his Liver scan we would have to order it

## 2018-06-07 NOTE — Telephone Encounter (Signed)
Pt has been scheduled for RUQ abdominal US/elastography at [facility] on Friday, May 22nd at 8:30am. Pt has been advised to arrive at 8:15am and to be NPO after midnight on Thursday night.

## 2018-06-09 ENCOUNTER — Ambulatory Visit
Admission: RE | Admit: 2018-06-09 | Discharge: 2018-06-09 | Disposition: A | Discharge status: Home / Self Care | Payer: Medicare Other | Source: Ambulatory Visit | Attending: Gastroenterology | Admitting: Gastroenterology

## 2018-06-09 ENCOUNTER — Ambulatory Visit (INDEPENDENT_AMBULATORY_CARE_PROVIDER_SITE_OTHER): Payer: Medicare Other | Admitting: Pharmacist

## 2018-06-09 ENCOUNTER — Other Ambulatory Visit: Payer: Self-pay

## 2018-06-09 DIAGNOSIS — R768 Other specified abnormal immunological findings in serum: Secondary | ICD-10-CM | POA: Insufficient documentation

## 2018-06-09 DIAGNOSIS — B182 Chronic viral hepatitis C: Secondary | ICD-10-CM | POA: Diagnosis not present

## 2018-06-09 DIAGNOSIS — J449 Chronic obstructive pulmonary disease, unspecified: Secondary | ICD-10-CM

## 2018-06-09 NOTE — Telephone Encounter (Signed)
PA denied on Cover My Meds.

## 2018-06-09 NOTE — Patient Instructions (Signed)
Visit Information  Goals Addressed            This Visit's Progress     Patient Stated   . "I need help getting my medications"  (pt-stated)       Current Barriers:   Reports that he was unable to pick up albuterol nebulizer  Notes that he had to pay $19 for Stiolto + Combivent inhalers this past week, when he was under the impression that he would have $0 copayments with his [employer] Part D plan and Medicaid   Pharmacist Clinical Goal(s):  [name] Over the next 30 days, patient will work with PharmD and PCP for optimized medication management  Interventions:   [facility]. They noted that albuterol nebulizers are requiring a PA. They are running prescriptions through both his [employer] Part D and Medicaid insurance  Collected the Part D plan card information, and sent this to our clinic CMA's to help complete the PA for the medication  Patient plans to call [employer] to better understand his benefits, as he was originally told he would have a $0 copayment for all medications. He also will call Medicaid to ensure his benefits are still active  Patient Self Care Activities:  . Current takes medications and denies problems affording medications . Patient does not appropriately contact providers office for refills  Please see past updates related to this goal by clicking on the "Past Updates" button in the selected goal         The patient verbalized understanding of instructions provided today and declined a print copy of patient instruction materials.   Plan:  - Will follow up with patient in 2-4 weeks to continue to support through medication management  [name], PharmD Clinical Pharmacist [place] [phone]

## 2018-06-09 NOTE — Chronic Care Management (AMB) (Signed)
  Chronic Care Management   Follow Up Note   06/09/2018 Name: Kevin Nelson MRN: [id number] DOB: 26-Jun-1950  Referred by: [name], NP Reason for referral : Chronic Care Management (Medication Management)   Kevin Nelson is a 68 y.o. year old male who is a primary care patient of Cannady, [name], NP. The CCM team was consulted for assistance with chronic disease management and care coordination needs.    Patient contacted clinic yesterday, asked that I return his call.  Review of patient status, including review of consultants reports, relevant laboratory and other test results, and collaboration with appropriate care team members and the patient's provider was performed as part of comprehensive patient evaluation and provision of chronic care management services.    Goals Addressed            This Visit's Progress     Patient Stated   . "I need help getting my medications"  (pt-stated)       Current Barriers:   Reports that he was unable to pick up albuterol nebulizer  Notes that he had to pay $19 for Stiolto + Combivent inhalers this past week, when he was under the impression that he would have $0 copayments with his [employer] Part D plan and Medicaid   Pharmacist Clinical Goal(s):  [name] Over the next 30 days, patient will work with PharmD and PCP for optimized medication management  Interventions:   [facility]. They noted that albuterol nebulizers are requiring a PA. They are running prescriptions through both his [employer] Part D and Medicaid insurance  Collected the Part D plan card information, and sent this to our clinic CMA's to help complete the PA for the medication  Patient plans to call [employer] to better understand his benefits, as he was originally told he would have a $0 copayment for all medications. He also will call Medicaid to ensure his benefits are still active  Patient Self Care Activities:  . Current takes medications and  denies problems affording medications . Patient does not appropriately contact providers office for refills  Please see past updates related to this goal by clicking on the "Past Updates" button in the selected goal         Plan:  - Will follow up with patient in 2-4 weeks to continue to support through medication management  [name], PharmD Clinical Pharmacist [place] [phone]

## 2018-06-09 NOTE — Telephone Encounter (Signed)
PA initiated and submitted on Cover My Meds.

## 2018-06-09 NOTE — Telephone Encounter (Signed)
Might need to go under Part B coverage, instead of Part D.   Contacted [facility], they are going to fax necessary paperwork over to the clinic

## 2018-06-09 NOTE — Telephone Encounter (Signed)
It must need a PA under his Part D card. He has both a Part D plan and Medicaid  He read the following off to me:   BIN [id number] PCN PARTD ID [id number] Group [id number]  Can we work on this PA?

## 2018-06-13 ENCOUNTER — Telehealth: Payer: Self-pay

## 2018-06-14 ENCOUNTER — Ambulatory Visit: Payer: Self-pay | Admitting: Pharmacist

## 2018-06-14 ENCOUNTER — Telehealth: Payer: Self-pay | Admitting: *Deleted

## 2018-06-14 DIAGNOSIS — J449 Chronic obstructive pulmonary disease, unspecified: Secondary | ICD-10-CM

## 2018-06-14 DIAGNOSIS — Z122 Encounter for screening for malignant neoplasm of respiratory organs: Secondary | ICD-10-CM

## 2018-06-14 DIAGNOSIS — Z87891 Personal history of nicotine dependence: Secondary | ICD-10-CM

## 2018-06-14 NOTE — Telephone Encounter (Signed)
Disregard. The problem was taken care of and the patient was able to receive his medication for $0 copay.

## 2018-06-14 NOTE — Telephone Encounter (Signed)
No fax came through from [facility] today.

## 2018-06-14 NOTE — Telephone Encounter (Signed)
Did we ever receive this fax from from [facility]? Unsure exactly what it would say, but likely just gathering clinical information for Part B DME billing

## 2018-06-14 NOTE — Telephone Encounter (Signed)
Should have come through Friday. I'll call [facility] and ask to resend.

## 2018-06-14 NOTE — Telephone Encounter (Signed)
Received referral for initial lung cancer screening scan. Contacted patient and obtained smoking history,(former, quit 04/14/18, 102 pack year) as well as answering questions related to screening process. Patient denies signs of lung cancer such as weight loss or hemoptysis. Patient denies comorbidity that would prevent curative treatment if lung cancer were found. Patient is scheduled for shared decision making visit and CT scan on 06/19/18 at 1145am.

## 2018-06-14 NOTE — Patient Instructions (Signed)
Visit Information  Goals Addressed            This Visit's Progress     Patient Stated   . "I need help getting my medications"  (pt-stated)       Current Barriers:   Reported that he was unable to pick up albuterol nebulizer  Worked with [facility], determined that albuterol nebs would likely need to be run through Part B. [facility] was going to fax forms to the clinic that need to be completed to run albuterol nebs under Part B, but these have not been received by the clinic  Pharmacist Clinical Goal(s):  [name] Over the next 30 days, patient will work with PharmD and PCP for optimized medication management  Interventions:   [facility]; they noted they sold the albuterol nebs to patient for $0  Contacted patient; he confirmed that he received the medication for no copayment. He denied needing assistance with any other medications at this time.   Patient Self Care Activities:  . Currently takes medications as prescribed . Patient does not appropriately contact providers office for refills  Please see past updates related to this goal by clicking on the "Past Updates" button in the selected goal         The patient verbalized understanding of instructions provided today and declined a print copy of patient instruction materials.   Plan:  - CCM team will continue to support the patient for chronic care management - CCM PharmD will outreach the patient in 2-3 weeks for continued medication management support   [name], PharmD Clinical Pharmacist [place] [phone]

## 2018-06-14 NOTE — Chronic Care Management (AMB) (Signed)
  Chronic Care Management   Follow Up Note   06/14/2018 Name: Kevin Nelson MRN: [id number] DOB: 1950-12-12  Referred by: [name], NP Reason for referral : Chronic Care Management (Medication Access)   Kevin Nelson is a 68 y.o. year old male who is a primary care patient of Cannady, [name], NP. The CCM team was consulted for assistance with chronic disease management and care coordination needs.    Care coordination completed to follow up on patient ability to receive albuterol nebulizer solution  Review of patient status, including review of consultants reports, relevant laboratory and other test results, and collaboration with appropriate care team members and the patient's provider was performed as part of comprehensive patient evaluation and provision of chronic care management services.    Goals Addressed            This Visit's Progress     Patient Stated   . "I need help getting my medications"  (pt-stated)       Current Barriers:   Reported that he was unable to pick up albuterol nebulizer  Worked with [facility], determined that albuterol nebs would likely need to be run through Part B. [facility] was going to fax forms to the clinic that need to be completed to run albuterol nebs under Part B, but these have not been received by the clinic  Pharmacist Clinical Goal(s):  [name] Over the next 30 days, patient will work with PharmD and PCP for optimized medication management  Interventions:   [facility]; they noted they sold the albuterol nebs to patient for $0  Contacted patient; he confirmed that he received the medication for no copayment. He denied needing assistance with any other medications at this time.   Patient Self Care Activities:  . Currently takes medications as prescribed . Patient does not appropriately contact providers office for refills  Please see past updates related to this goal by clicking on the "Past Updates" button in the selected  goal          Plan:  - CCM team will continue to support the patient for chronic care management - CCM PharmD will outreach the patient in 2-3 weeks for continued medication management support   [name], PharmD Clinical Pharmacist [place] [phone]

## 2018-06-16 ENCOUNTER — Telehealth: Payer: Self-pay

## 2018-06-19 ENCOUNTER — Inpatient Hospital Stay: Payer: Medicare Other | Attending: Nurse Practitioner | Admitting: Oncology

## 2018-06-19 ENCOUNTER — Ambulatory Visit
Admission: RE | Admit: 2018-06-19 | Discharge: 2018-06-19 | Disposition: A | Discharge status: Home / Self Care | Payer: Medicare Other | Source: Ambulatory Visit | Attending: Nurse Practitioner | Admitting: Nurse Practitioner

## 2018-06-19 ENCOUNTER — Other Ambulatory Visit: Payer: Self-pay

## 2018-06-19 DIAGNOSIS — Z87891 Personal history of nicotine dependence: Secondary | ICD-10-CM

## 2018-06-19 DIAGNOSIS — Z122 Encounter for screening for malignant neoplasm of respiratory organs: Secondary | ICD-10-CM | POA: Insufficient documentation

## 2018-06-19 NOTE — Progress Notes (Signed)
In accordance with CMS guidelines, patient has met eligibility criteria including age, absence of signs or symptoms of lung cancer.  Social History   Tobacco Use  . Smoking status: Former Smoker    Packs/day: 2.00    Years: 51.00    Pack years: 102.00    Types: Cigarettes    Last attempt to quit: 04/14/2018    Years since quitting: 0.1  . Smokeless tobacco: Never Used  Substance Use Topics  . Alcohol use: Yes    Alcohol/week: 3.0 standard drinks    Types: 3 Cans of beer per week    Comment: last drink 04/13/18  . Drug use: Never     A shared decision-making session was conducted prior to the performance of CT scan. This includes one or more decision aids, includes benefits and harms of screening, follow-up diagnostic testing, over-diagnosis, false positive rate, and total radiation exposure.  Counseling on the importance of adherence to annual lung cancer LDCT screening, impact of co-morbidities, and ability or willingness to undergo diagnosis and treatment is imperative for compliance of the program.  Counseling on the importance of continued smoking cessation for former smokers; the importance of smoking cessation for current smokers, and information about tobacco cessation interventions have been given to patient including [place] and [facility] quit [place] programs.  Written order for lung cancer screening with LDCT has been given to the patient and any and all questions have been answered to the best of my abilities.   Yearly follow up will be coordinated by Burgess Estelle, Thoracic Navigator.  Faythe Casa, NP 06/19/2018 11:55 AM

## 2018-06-20 ENCOUNTER — Ambulatory Visit: Payer: Medicare Other | Admitting: Licensed Clinical Social Worker

## 2018-06-20 DIAGNOSIS — F411 Generalized anxiety disorder: Secondary | ICD-10-CM

## 2018-06-20 NOTE — Chronic Care Management (AMB) (Signed)
  Chronic Care Management    Clinical Social Work Follow Up Note  06/20/2018 Name: Kevin Nelson MRN: [id number] DOB: Dec 13, 1950  Kevin Nelson is a 68 y.o. year old male who is a primary care patient of Cannady, [name], NP. The CCM team was consulted for assistance with [employer].   Review of patient status, including review of consultants reports, other relevant assessments, and collaboration with appropriate care team members and the patient's provider was performed as part of comprehensive patient evaluation and provision of chronic care management services.     Goals Addressed    . "I need more support" (pt-stated)       Current Barriers:  [name] Knowledge Deficits related to housing options/community resources that are available to him . Financial Constraints - during his most recent PCP visit, he voiced concerns about his housing and finances, currently lives with two friends (childhood friend)   Clinical Social Worker Clinical Goal(s):  [name] Over the next 90 days, patient will verbalize understanding of plan for gaining crisis support resource education and implementing appropriate self-care tools into his daily routine to improve overall self-care . Over the next 90 days, patient will work with [facility] LCSW to address needs related to housing, financial bariers and lack of self-care   Interventions: . Patient interviewed and appropriate assessments performed. Patient reports having a lung scan done this past week. . Provided mental health counseling with regard to coping with daily stress. [name] LCSW provided education on deep breathing and relaxation techniques to implement into her daily routine to combat stressors  . Provided patient with information about low income housing resources that are available. Currently Section 8 and Public Housing wait list is closed . Discussed plans with patient for ongoing care management follow up and provided patient with direct contact information  for care management team . Advised patient to follow up with provided community resources as needed . Assisted patient/caregiver with obtaining information about health plan benefits  Patient Self Care Activities:  . Attends all scheduled provider appointments . Calls provider office for new concerns or questions Please see past updates related to this goal by clicking on the "Past Updates" button in the selected goal    Follow Up Plan: SW will follow up with patient by phone over the next month  Eula Fried, [place], MSW, [email or web address] Phone: [phone]

## 2018-06-21 ENCOUNTER — Encounter: Payer: Self-pay | Admitting: *Deleted

## 2018-06-22 ENCOUNTER — Telehealth: Payer: Self-pay

## 2018-06-22 NOTE — Telephone Encounter (Signed)
-----   Message from Lucilla Lame, MD sent at 06/17/2018 10:22 AM EDT ----- The patient know that his ultrasound 4 fibrosis showed him to have to Moderate fibrosis.  The patient should be treated for his hepatitis C.

## 2018-06-22 NOTE — Telephone Encounter (Signed)
Pt notified of US results.  

## 2018-06-28 ENCOUNTER — Telehealth: Payer: Self-pay

## 2018-07-04 ENCOUNTER — Other Ambulatory Visit: Payer: Self-pay | Admitting: Oncology

## 2018-07-04 ENCOUNTER — Telehealth: Payer: Self-pay

## 2018-07-11 ENCOUNTER — Telehealth: Payer: Self-pay

## 2018-07-19 DIAGNOSIS — L578 Other skin changes due to chronic exposure to nonionizing radiation: Secondary | ICD-10-CM | POA: Diagnosis not present

## 2018-07-19 DIAGNOSIS — D485 Neoplasm of uncertain behavior of skin: Secondary | ICD-10-CM | POA: Diagnosis not present

## 2018-07-19 DIAGNOSIS — Z85828 Personal history of other malignant neoplasm of skin: Secondary | ICD-10-CM | POA: Diagnosis not present

## 2018-07-19 DIAGNOSIS — C44519 Basal cell carcinoma of skin of other part of trunk: Secondary | ICD-10-CM | POA: Diagnosis not present

## 2018-07-19 DIAGNOSIS — L905 Scar conditions and fibrosis of skin: Secondary | ICD-10-CM | POA: Diagnosis not present

## 2018-07-19 DIAGNOSIS — L720 Epidermal cyst: Secondary | ICD-10-CM | POA: Diagnosis not present

## 2018-07-19 DIAGNOSIS — C44619 Basal cell carcinoma of skin of left upper limb, including shoulder: Secondary | ICD-10-CM | POA: Diagnosis not present

## 2018-07-21 ENCOUNTER — Telehealth: Payer: Self-pay

## 2018-07-24 ENCOUNTER — Telehealth: Payer: Self-pay | Admitting: Gastroenterology

## 2018-07-24 NOTE — Telephone Encounter (Signed)
Kevin Nelson stating he can't get through to anyone at [facility] for his medicine. He needs help and not sure what to do?

## 2018-07-24 NOTE — Telephone Encounter (Signed)
Contacted pt and he notified me, he has spoken with the specialty pharmacy today and his medication

## 2018-07-26 ENCOUNTER — Telehealth: Payer: Self-pay

## 2018-07-28 ENCOUNTER — Ambulatory Visit: Payer: Medicare Other | Admitting: Licensed Clinical Social Worker

## 2018-07-28 ENCOUNTER — Other Ambulatory Visit: Payer: Self-pay

## 2018-07-28 DIAGNOSIS — F411 Generalized anxiety disorder: Secondary | ICD-10-CM

## 2018-07-28 NOTE — Chronic Care Management (AMB) (Signed)
  Chronic Care Management    Clinical Social Work Follow Up Note  07/28/2018 Name: Kevin Nelson MRN: [id number] DOB: Aug 27, 1950  Kevin Nelson is a 68 y.o. year old male who is a primary care patient of Cannady, [name], NP. The CCM team was consulted for assistance with Mental Health Counseling and Resources.   Review of patient status, including review of consultants reports, other relevant assessments, and collaboration with appropriate care team members and the patient's provider was performed as part of comprehensive patient evaluation and provision of chronic care management services.     Goals Addressed    . "I need more support. I'm anxious all the time" (pt-stated)       Current Barriers:  [name] Knowledge Deficits related to housing options/community resources that are available to him . Financial Constraints - during his most recent PCP visit, he voiced concerns about his housing and finances, currently lives with two friends (childhood friend)   Clinical Social Worker Clinical Goal(s):  [name] Over the next 90 days, patient will verbalize understanding of plan for gaining crisis support resource education and implementing appropriate self-care tools into his daily routine to improve overall self-care . Over the next 90 days, patient will work with [facility] LCSW to address needs related to housing, financial bariers and lack of self-care     Interventions: . Patient interviewed and appropriate assessments performed . Provided mental health counseling with regard to coping with daily stress. [name] LCSW provided education on deep breathing and relaxation techniques to implement into his daily routine to combat stressors. Patient admits that his anxiety continues but he has been challenging his negative thinking in order to gain a more positive perspective. Patient admits that he could benefit from socialization but due to [place] he has been staying at home and taking appropriate precautions. LCSW  provided education on available mental health support resources and socialization opportunities within his community. . Provided patient with information about low income housing resources that are available. However, currently Section 8 and Public Housing wait list is still closed.  [name] LCSW provided reflective listening and implemented appropriate interventions to help suppport patient and her emotional needs  . Discussed plans with patient for ongoing care management follow up and provided patient with direct contact information for care management team . Advised patient to follow up with provided community resources as needed . Assisted patient/caregiver with obtaining information about health plan benefits . Patient confirms stable transportation to his upcoming PCP appointment.  Patient Self Care Activities:  . Attends all scheduled provider appointments . Calls provider office for new concerns or questions  Please see past updates related to this goal by clicking on the "Past Updates" button in the selected goal    Follow Up Plan: SW will follow up with patient by phone over the next 30 days  Eula Fried, [employer], MSW, [email or web address] Phone: [phone]

## 2018-07-31 ENCOUNTER — Encounter: Payer: Self-pay | Admitting: Nurse Practitioner

## 2018-07-31 ENCOUNTER — Ambulatory Visit: Payer: Medicare Other | Admitting: Nurse Practitioner

## 2018-07-31 ENCOUNTER — Other Ambulatory Visit: Payer: Self-pay

## 2018-07-31 VITALS — BP 109/70 | HR 89 | Temp 98.7°F | Ht 66.14 in | Wt 106.0 lb

## 2018-07-31 DIAGNOSIS — Z0120 Encounter for dental examination and cleaning without abnormal findings: Secondary | ICD-10-CM

## 2018-07-31 DIAGNOSIS — F411 Generalized anxiety disorder: Secondary | ICD-10-CM

## 2018-07-31 DIAGNOSIS — J449 Chronic obstructive pulmonary disease, unspecified: Secondary | ICD-10-CM

## 2018-07-31 DIAGNOSIS — H9313 Tinnitus, bilateral: Secondary | ICD-10-CM

## 2018-07-31 DIAGNOSIS — I700 Atherosclerosis of aorta: Secondary | ICD-10-CM | POA: Insufficient documentation

## 2018-07-31 DIAGNOSIS — Z0100 Encounter for examination of eyes and vision without abnormal findings: Secondary | ICD-10-CM

## 2018-07-31 DIAGNOSIS — F17210 Nicotine dependence, cigarettes, uncomplicated: Secondary | ICD-10-CM

## 2018-07-31 DIAGNOSIS — I2510 Atherosclerotic heart disease of native coronary artery without angina pectoris: Secondary | ICD-10-CM | POA: Insufficient documentation

## 2018-07-31 NOTE — Patient Instructions (Signed)
Voltaren gel (Diclofenac)  Arthritis Arthritis means joint pain. It can also mean joint disease. A joint is a place where bones come together. There are more than 100 types of arthritis. What are the causes? This condition may be caused by:  Wear and tear of a joint. This is the most common cause.  A lot of acid in the blood, which leads to pain in the joint (gout).  Pain and swelling (inflammation) in a joint.  Infection of a joint.  Injuries in the joint.  A reaction to medicines (allergy). In some cases, the cause may not be known. What are the signs or symptoms? Symptoms of this condition include:  Redness at a joint.  Swelling at a joint.  Stiffness at a joint.  Warmth coming from the joint.  A fever.  A feeling of being sick. How is this treated? This condition may be treated with:  Treating the cause, if it is known.  Rest.  Raising (elevating) the joint.  Putting cold or hot packs on the joint.  Medicines to treat symptoms and reduce pain and swelling.  Shots of medicines (cortisone) into the joint. You may also be told to make changes in your life, such as doing exercises and losing weight. Follow these instructions at home: Medicines  Take over-the-counter and prescription medicines only as told by your doctor.  Do not take aspirin for pain if your doctor says that you may have gout. Activity  Rest your joint if your doctor tells you to.  Avoid activities that make the pain worse.  Exercise your joint regularly as told by your doctor. Try doing exercises like: ? Swimming. ? Water aerobics. ? Biking. ? Walking. Managing pain, stiffness, and swelling      If told, put ice on the affected area. ? Put ice in a plastic bag. ? Place a towel between your skin and the bag. ? Leave the ice on for 20 minutes, 2-3 times per day.  If your joint is swollen, raise (elevate) it above the level of your heart if told by your doctor.  If your joint  feels stiff in the morning, try taking a warm shower.  If told, put heat on the affected area. Do this as often as told by your doctor. Use the heat source that your doctor recommends, such as a moist heat pack or a heating pad. If you have diabetes, do not apply heat without asking your doctor. To apply heat: ? Place a towel between your skin and the heat source. ? Leave the heat on for 20-30 minutes. ? Remove the heat if your skin turns bright red. This is very important if you are unable to feel pain, heat, or cold. You may have a greater risk of getting burned. General instructions  Do not use any products that contain nicotine or tobacco, such as cigarettes, e-cigarettes, and chewing tobacco. If you need help quitting, ask your doctor.  Keep all follow-up visits as told by your doctor. This is important. Contact a doctor if:  The pain gets worse.  You have a fever. Get help right away if:  You have very bad pain in your joint.  You have swelling in your joint.  Your joint is red.  Many joints become painful and swollen.  You have very bad back pain.  Your leg is very weak.  You cannot control your pee (urine) or poop (stool). Summary  Arthritis means joint pain. It can also mean joint disease. A  joint is a place where bones come together.  The most common cause of this condition is wear and tear of a joint.  Symptoms of this condition include redness, swelling, or stiffness of the joint.  This condition is treated with rest, raising the joint, medicines, and putting cold or hot packs on the joint.  Follow your doctor's instructions about medicines, activity, exercises, and other home care treatments. This information is not intended to replace advice given to you by your health care provider. Make sure you discuss any questions you have with your health care provider. Document Released: 03/31/2009 Document Revised: 12/12/2017 Document Reviewed: 12/12/2017 Elsevier  Patient Education  2020 [employer].

## 2018-07-31 NOTE — Assessment & Plan Note (Signed)
I have recommended complete cessation of tobacco use. I have discussed various options available for assistance with tobacco cessation including over the counter methods (Nicotine gum, patch and lozenges). We also discussed prescription options (Chantix, Nicotine Inhaler / Nasal Spray). The patient is not interested in pursuing any prescription tobacco cessation options at this time.  

## 2018-07-31 NOTE — Assessment & Plan Note (Signed)
Chronic, ongoing with O2 dependence.  Continue current medication regimen and collaboration with pulmonology.  Recommend complete smoking cessation.

## 2018-07-31 NOTE — Assessment & Plan Note (Addendum)
Chronic, ongoing.  Denies SI/HI.  Continue current medication regimen, will send refills of Klonopin upon request.  Recommended addition of Zoloft or Mirtazapine (would benefit mood and appetite), he wishes to think about these.  Return in 3 months.

## 2018-07-31 NOTE — Progress Notes (Signed)
BP 109/70   Pulse 89 Comment: apical  Temp 98.7 F (37.1 C) (Oral)   Ht 5' 6.14" (1.68 m)   Wt 106 lb (48.1 kg)   SpO2 94%   PF (!) 2 L/min   BMI 17.04 kg/m    Subjective:    Patient ID: Kevin Nelson, male    DOB: 1950/11/20, 68 y.o.   MRN: [id number]  HPI: Kevin Nelson is a 68 y.o. male  Chief Complaint  Patient presents with  . Anxiety   ANXIETY/STRESS Continues on Klonopin 0.5MG  BID PRN.  Pt is aware of risks of psychoactive medication use to include increased sedation, respiratory suppression, falls, dependence and cardiovascular events.  Pt would like to continue treatment as benefit determined to outweigh risk.  Discussed with him addition of Zoloft and he wishes to think about this at this time.  Endorses increased stressors with Covid 19 and quality of life with his COPD, Prostate CA, and Hep C.  Discussed palliative option and educated on palliative team, which he wishes to think about. Duration:stable Anxious mood: yes  Excessive worrying: yes Irritability: no  Sweating: no Nausea: no Palpitations:no Hyperventilation: no Panic attacks: no Agoraphobia: no  Obscessions/compulsions: no Depressed mood: yes Depression screen [facility] 2/9 07/31/2018 01/27/2018 01/13/2018  Decreased Interest 3 1 1   Down, Depressed, Hopeless 3 2 2   PHQ - 2 Score 6 3 3   Altered sleeping 3 3 3   Tired, decreased energy 3 2 2   Change in appetite 3 2 2   Feeling bad or failure about yourself  0 0 0  Trouble concentrating 1 0 0  Moving slowly or fidgety/restless 3 0 0  Suicidal thoughts 0 0 0  PHQ-9 Score 19 10 10   Difficult doing work/chores Somewhat difficult Somewhat difficult -   Anhedonia: no Weight changes: no Insomnia: yes hard to fall asleep  Hypersomnia: no Fatigue/loss of energy: yes Feelings of worthlessness: yes Feelings of guilt: yes Impaired concentration/indecisiveness: yes Suicidal ideations: no  Crying spells: no Recent Stressors/Life Changes: no   Relationship  problems: no   Family stress: no     Financial stress: no    Job stress: no    Recent death/loss: no  COPD Saw Dr. Alva Garnet last in January, pulmonary. Currently continues on Stiolto and Combivent, Albuterol.  Recent chest CT for CA screening was benign and he is to return in 12 months.  We did discussed findings of coronary arthrosclerosis and stable dilated main pulmonary artery, which was suggestive of chronic pulmonary arterial hypertension.  He has no current follow-up appointment scheduled with pulmonary, but is going to schedule. Discussed current downward trend on weight and encouraged Ensure or protein supplement three times a day.  Reports he did start smoking again, a couple cigarettes a day. COPD status: stable Satisfied with current treatment?: yes Oxygen use: yes Dyspnea frequency: at baseline Cough frequency: reports not coughing as much Rescue inhaler frequency:  2 times a day Limitation of activity: no Productive cough: not present Last Spirometry: January 2019 Pneumovax: Not up to date Influenza: Up to Date  Relevant past medical, surgical, family and social history reviewed and updated as indicated. Interim medical history since our last visit reviewed. Allergies and medications reviewed and updated.  Review of Systems  Constitutional: Negative for activity change, fatigue and fever.  HENT: Negative for congestion, ear pain, rhinorrhea, sinus pressure, sinus pain and sore throat.   Eyes: Negative for pain, discharge and redness.  Respiratory: Negative for cough, chest tightness, shortness of breath  and wheezing.   Cardiovascular: Negative for chest pain, palpitations and leg swelling.  Gastrointestinal: Negative for abdominal distention, abdominal pain, constipation, diarrhea, nausea and vomiting.  Endocrine: Negative for cold intolerance, heat intolerance, polydipsia, polyphagia and polyuria.  Genitourinary: Negative for difficulty urinating.  Musculoskeletal:  Negative for back pain, myalgias and neck pain.  Skin: Negative.   Allergic/Immunologic: Negative.   Neurological: Negative for dizziness, syncope, numbness and headaches.  Hematological: Negative.   Psychiatric/Behavioral: Positive for decreased concentration and sleep disturbance. Negative for behavioral problems, self-injury and suicidal ideas.    Per HPI unless specifically indicated above     Objective:    BP 109/70   Pulse 89 Comment: apical  Temp 98.7 F (37.1 C) (Oral)   Ht 5' 6.14" (1.68 m)   Wt 106 lb (48.1 kg)   SpO2 94%   PF (!) 2 L/min   BMI 17.04 kg/m   Wt Readings from Last 3 Encounters:  07/31/18 106 lb (48.1 kg)  06/19/18 115 lb (52.2 kg)  04/19/18 109 lb (49.4 kg)    Physical Exam Vitals signs and nursing note reviewed.  Constitutional:      General: He is not in acute distress.    Appearance: He is well-developed and underweight. He is not ill-appearing.  HENT:     Head: Normocephalic and atraumatic.     Right Ear: Hearing normal. No drainage.     Left Ear: Hearing normal. No drainage.     Mouth/Throat:     Pharynx: Uvula midline.  Eyes:     General: Lids are normal.        Right eye: No discharge.        Left eye: No discharge.     Conjunctiva/sclera: Conjunctivae normal.     Pupils: Pupils are equal, round, and reactive to light.  Neck:     Musculoskeletal: Normal range of motion and neck supple.     Thyroid: No thyromegaly.     Vascular: No carotid bruit or JVD.     Trachea: Trachea normal.  Cardiovascular:     Rate and Rhythm: Normal rate and regular rhythm.     Heart sounds: Normal heart sounds, S1 normal and S2 normal. No murmur. No gallop.   Pulmonary:     Effort: Pulmonary effort is normal.     Breath sounds: Decreased breath sounds present.     Comments: Diminished breath sounds throughout with occasional expiratory wheezes throughout.  Intermittent nonproductive cough noted.  No SOB with talking.  O2 in place via [place] at 2.5L.  Abdominal:     General: Bowel sounds are normal.     Palpations: Abdomen is soft. There is no hepatomegaly or splenomegaly.  Musculoskeletal: Normal range of motion.     Right lower leg: No edema.     Left lower leg: No edema.  Skin:    General: Skin is warm and dry.     Capillary Refill: Capillary refill takes less than 2 seconds.     Findings: No rash.  Neurological:     Mental Status: He is alert and oriented to person, place, and time.     Deep Tendon Reflexes: Reflexes are normal and symmetric.  Psychiatric:        Mood and Affect: Mood normal.        Speech: Speech normal.        Behavior: Behavior normal.        Thought Content: Thought content normal.        Judgment:  Judgment normal.     Results for orders placed or performed during the hospital encounter of 05/09/18  HCV RNA quant  Result Value Ref Range   HCV Quantitative See Final Results >50 IU/mL   Test Information Comment   Hepatic function panel  Result Value Ref Range   Total Protein 7.0 6.5 - 8.1 g/dL   Albumin 3.9 3.5 - 5.0 g/dL   AST 20 15 - 41 U/L   ALT 16 0 - 44 U/L   Alkaline Phosphatase 57 38 - 126 U/L   Total Bilirubin 0.8 0.3 - 1.2 mg/dL   Bilirubin, Direct <0.1 0.0 - 0.2 mg/dL   Indirect Bilirubin NOT CALCULATED 0.3 - 0.9 mg/dL  ANA  Result Value Ref Range   Anti Nuclear Antibody (ANA) Positive (A) Negative  Hepatitis C genotype  Result Value Ref Range   HCV Genotype 1a    Please Note (HCV): Comment   Hepatitis A antibody, total  Result Value Ref Range   hep A Total Ab Negative Negative  Alpha-1-antitrypsin  Result Value Ref Range   A-1 Antitrypsin, Ser 138 101 - 187 mg/dL  Anti-smooth muscle antibody, IgG  Result Value Ref Range   F-Actin IgG 10 0 - 19 Units  Hepatitis B surface antigen  Result Value Ref Range   Hepatitis B Surface Ag Negative Negative  Mitochondrial antibodies  Result Value Ref Range   Mitochondrial M2 Ab, IgG 45.8 (H) 0.0 - 20.0 Units  Ceruloplasmin  Result  Value Ref Range   Ceruloplasmin 23.7 16.0 - 31.0 mg/dL  Miscellaneous [facility] test (send-out)  Result Value Ref Range   [facility] test code [id number]    [facility] test name HEP B QUALATATIVE    Misc [facility] result COMMENT   HCV RNA (International Units)  Result Value Ref Range   HCV RNA (International Units) 13,700,000 IU/mL   HCV log10 7.137 log10 IU/mL      Assessment & Plan:   Problem List Items Addressed This Visit      Respiratory   COPD, very severe ([place]) - Primary    Chronic, ongoing with O2 dependence.  Continue current medication regimen and collaboration with pulmonology.  Recommend complete smoking cessation.          Other   Generalized anxiety disorder    Chronic, ongoing.  Denies SI/HI.  Continue current medication regimen, will send refills of Klonopin upon request.  Recommended addition of Zoloft or Mirtazapine (would benefit mood and appetite), he wishes to think about these.  Return in 3 months.        Nicotine dependence, cigarettes, uncomplicated    I have recommended complete cessation of tobacco use. I have discussed various options available for assistance with tobacco cessation including over the counter methods (Nicotine gum, patch and lozenges). We also discussed prescription options (Chantix, Nicotine Inhaler / Nasal Spray). The patient is not interested in pursuing any prescription tobacco cessation options at this time.       Other Visit Diagnoses    Tinnitus aurium, bilateral       Referral placed per patient request for hearing test.   Relevant Orders   Ambulatory referral to ENT   Encounter for dental examination       request for dental exam referral   Relevant Orders   Ambulatory referral to Dentistry   Eye exam, routine       request for eye exam referral   Relevant Orders   Ambulatory referral to Ophthalmology  Controlled substance.  Checked data base and no other refills of controlled substances noted. Last Klonopin refill 07/24/18 and  will refill upon request once due.  Time: 25 minutes, >50% spent counseling/or care coordination on disease processes and care  Follow up plan: Return in about 3 months (around 10/31/2018) for COPD and mood.

## 2018-08-02 ENCOUNTER — Other Ambulatory Visit: Payer: Self-pay | Admitting: Nurse Practitioner

## 2018-08-02 ENCOUNTER — Ambulatory Visit: Payer: Medicare Other | Admitting: *Deleted

## 2018-08-02 DIAGNOSIS — J449 Chronic obstructive pulmonary disease, unspecified: Secondary | ICD-10-CM

## 2018-08-02 DIAGNOSIS — M19041 Primary osteoarthritis, right hand: Secondary | ICD-10-CM

## 2018-08-02 NOTE — Patient Instructions (Addendum)
Thank you allowing the Chronic Care Management Team to be a part of your care! It was a pleasure speaking with you today! CCM (Chronic Care Management) Team   Nomi Rudnicki RN, BSN Nurse Care Coordinator  [phone]  [facility] PharmD  Clinical Pharmacist  [phone]  Eula Fried LCSW Clinical Social Worker [phone]  Goals Addressed            This Visit's Progress   . COPD Management (pt-stated)       Current Barriers:  [name] Knowledge deficits related to basic COPD self care/management . Limited Social Support   Case Manager Clinical Goal(s):  Over the next 90 days, patient will be able to verbalize understanding of COPD action plan and when to seek appropriate levels of medical care  Over the next 90 days, patient will not be hospitalized for COPD exacerbation   Interventions:  . Reviewed COPD action plan . Patient reports having all COPD medications . Reviewed upcoming appointment with Pulmonology 7/27 . Patient reporting he is on his 5th week of hepatitis treatment and it is going well. . Patient had complaints of pain to left hand and knuckles, requested that I let PCP know that he would like to see someone about his hand.  . Message sent to PCP about hand pain . PCP would like to start patient with imaging and PT for hand will reach out to pt and let him know.  . Was able to reach patient this afternoon and let him know PCP recommendations, patient stated he was in agreement to do imaging and PT. Also gave patient the PCP's suggestions to try Diclofenac or Biofreeze.  No Tylenol due to Hep C, but can use Ibuprofen occasionally (not frequently). .  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  . X-rays to be done@[place]  [place] . [address], [place] [address] Main: [phone]    Patient Self Care Activities:  Takes medications as prescribed including inhalers  Does not currently perform self care activities related to COPD management  Please see past updates related to this goal by clicking on the "Past Updates" button in the selected goal         The patient verbalized understanding of instructions provided today and declined a print copy of patient instruction materials.   The patient has been provided with contact information for the care management team and has been advised to call with any health related questions or concerns.

## 2018-08-02 NOTE — Progress Notes (Signed)
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

## 2018-08-02 NOTE — Chronic Care Management (AMB) (Signed)
  Chronic Care Management   Follow Up Note   08/02/2018 Name: Kevin Nelson MRN: [id number] DOB: April 30, 1950  Referred by: [name], NP Reason for referral : No chief complaint on file.   Kevin Nelson is a 68 y.o. year old male who is a primary care patient of Cannady, [name], NP. The CCM team was consulted for assistance with chronic disease management and care coordination needs.    Review of patient status, including review of consultants reports, relevant laboratory and other test results, and collaboration with appropriate care team members and the patient's provider was performed as part of comprehensive patient evaluation and provision of chronic care management services.    Goals Addressed            This Visit's Progress   . COPD Management (pt-stated)       Current Barriers:  [name] Knowledge deficits related to basic COPD self care/management . Limited Social Support   Case Manager Clinical Goal(s):  Over the next 90 days, patient will be able to verbalize understanding of COPD action plan and when to seek appropriate levels of medical care  Over the next 90 days, patient will not be hospitalized for COPD exacerbation   Interventions:  . Reviewed COPD action plan . Patient reports having all COPD medications . Reviewed upcoming appointment with Pulmonology 7/27 . Patient reporting he is on his 5th week of hepatitis treatment and it is going well. . Patient had complaints of pain to left hand and knuckles, requested that I let PCP know that he would like to see someone about his hand.  . Message sent to PCP about hand pain . PCP would like to start patient with imaging and PT for hand will reach out to pt and let him know.  . Was able to reach patient this afternoon and let him know PCP recommendations, patient stated he was in agreement to do imaging and PT. Also gave patient the PCP's suggestions to try Diclofenac or Biofreeze.  No Tylenol due to Hep C, but can use  Ibuprofen occasionally (not frequently). .  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  . X-rays to be done@[place]  [place] . [address] [place], [place] [address] Main: [phone]   Patient Self Care Activities:  Takes medications as prescribed including inhalers  Does not currently perform self care activities related to COPD management  Please see past updates related to this goal by clicking on the "Past Updates" button in the selected goal          The care management team will reach out to the patient again over the next 30 days.  The patient has been provided with contact information for the care management team and has been advised to call with any health related questions or concerns.   [name] RN, BSN Nurse Case Editor, commissioning Family Practice/[facility] Care Management  [phone]) Business Mobile

## 2018-08-03 ENCOUNTER — Ambulatory Visit: Payer: Self-pay | Admitting: *Deleted

## 2018-08-03 DIAGNOSIS — M19041 Primary osteoarthritis, right hand: Secondary | ICD-10-CM

## 2018-08-03 NOTE — Chronic Care Management (AMB) (Signed)
  Chronic Care Management   Follow Up Note   08/03/2018 Name: Kevin Nelson MRN: [id number] DOB: Jul 25, 1950  Referred by: [name], NP Reason for referral : No chief complaint on file.   Kevin Nelson is a 68 y.o. year old male who is a primary care patient of Cannady, [name], NP. The CCM team was consulted for assistance with chronic disease management and care coordination needs.    Review of patient status, including review of consultants reports, relevant laboratory and other test results, and collaboration with appropriate care team members and the patient's provider was performed as part of comprehensive patient evaluation and provision of chronic care management services.    Goals Addressed            This Visit's Progress   . COPD Management (pt-stated)       Current Barriers:  [name] Knowledge deficits related to basic COPD self care/management . Limited Social Support   Case Manager Clinical Goal(s):  Over the next 90 days, patient will be able to verbalize understanding of COPD action plan and when to seek appropriate levels of medical care  Over the next 90 days, patient will not be hospitalized for COPD exacerbation   Interventions:  . Reviewed COPD action plan . Patient reports having all COPD medications . Reviewed upcoming appointment with Pulmonology 7/27 . Patient reporting he is on his 5th week of hepatitis treatment and it is going well. . Patient had complaints of pain to left hand and knuckles, requested that I let PCP know that he would like to see someone about his hand.  . Message sent to PCP about hand pain . PCP would like to start patient with imaging and PT for hand will reach out to pt and let him know.  . Was able to reach patient this afternoon and let him know PCP recommendations, patient stated he was in agreement to do imaging and PT. Also gave patient the PCP's suggestions to try Diclofenac or Biofreeze.  No Tylenol due to Hep C, but can use  Ibuprofen occasionally (not frequently). .  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  . X-rays to be done@[place]  [place] . [place] [address] [place], [place] [address] Main: [phone]  . Placed a call to imaging center to confirm they received orders and if there was a specific time patient was to come- X-rays are a walk in service M-F 9-5. [name] Was able to reach patient and let him know of his ordered xrays and the imaging centers address and phone number.   Patient Self Care Activities:  Takes medications as prescribed including inhalers  Does not currently perform self care activities related to COPD management  Please see past updates related to this goal by clicking on the "Past Updates" button in the selected goal          The care management team will reach out to the patient again over the next 30 days.    Merlene Morse Demarkus Remmel RN, BSN Nurse Case Editor, commissioning Family Practice/[facility] Care [facility]  [phone]) Business Mobile

## 2018-08-09 ENCOUNTER — Telehealth: Payer: Medicare Other | Admitting: Urology

## 2018-08-10 ENCOUNTER — Ambulatory Visit
Admission: RE | Admit: 2018-08-10 | Discharge: 2018-08-10 | Disposition: A | Discharge status: Home / Self Care | Payer: Medicare Other | Source: Ambulatory Visit | Attending: Nurse Practitioner | Admitting: Nurse Practitioner

## 2018-08-10 ENCOUNTER — Other Ambulatory Visit: Payer: Self-pay

## 2018-08-10 ENCOUNTER — Ambulatory Visit
Admission: RE | Admit: 2018-08-10 | Discharge: 2018-08-10 | Disposition: A | Discharge status: Home / Self Care | Payer: Medicare Other | Attending: Nurse Practitioner | Admitting: Nurse Practitioner

## 2018-08-10 DIAGNOSIS — M19041 Primary osteoarthritis, right hand: Secondary | ICD-10-CM | POA: Diagnosis not present

## 2018-08-10 DIAGNOSIS — M19042 Primary osteoarthritis, left hand: Secondary | ICD-10-CM | POA: Insufficient documentation

## 2018-08-10 DIAGNOSIS — M79641 Pain in right hand: Secondary | ICD-10-CM | POA: Diagnosis not present

## 2018-08-14 ENCOUNTER — Ambulatory Visit (INDEPENDENT_AMBULATORY_CARE_PROVIDER_SITE_OTHER): Payer: Medicare Other | Admitting: Pulmonary Disease

## 2018-08-14 ENCOUNTER — Encounter: Payer: Self-pay | Admitting: Pulmonary Disease

## 2018-08-14 ENCOUNTER — Ambulatory Visit: Payer: Medicare Other | Admitting: Pulmonary Disease

## 2018-08-14 DIAGNOSIS — C61 Malignant neoplasm of prostate: Secondary | ICD-10-CM

## 2018-08-14 DIAGNOSIS — J449 Chronic obstructive pulmonary disease, unspecified: Secondary | ICD-10-CM

## 2018-08-14 DIAGNOSIS — R911 Solitary pulmonary nodule: Secondary | ICD-10-CM | POA: Diagnosis not present

## 2018-08-14 DIAGNOSIS — F17200 Nicotine dependence, unspecified, uncomplicated: Secondary | ICD-10-CM

## 2018-08-14 MED ORDER — GUAIFENESIN ER 600 MG PO TB12: 600.0000 mg | ORAL_TABLET | Freq: Two times a day (BID) | ORAL | 10 refills | Status: DC | PRN

## 2018-08-14 NOTE — Progress Notes (Addendum)
PULMONARY OFFICE FOLLOW-UP NOTE  Requesting MD/Service: Marnee Guarneri, NP Date of initial consultation: 01/25/18 Reason for consultation: Very severe COPD, smoker  PT PROFILE: 68 y.o. male smoker (started age 50, maximum 2 PPD) recently moved to the area with history of oxygen dependent COPD wishes to establish care for COPD/pulmonary issues  DATA: 02/03/18 6MWT: Submaximal effort.  Test terminated after patient reached SPO2 84%. 02/14/18 PFTs: FVC: 2.28 L (56 %pred), FEV1: 0.80 L (25 %pred), FEV1/FVC: 35%, TLC: 7.77 L (121 %pred), DLCO 45 %pred.  Flow volume curve consistent with very severe obstruction.  No significant change after bronchodilator challenge. 04/14/18 CTA chest: Emphysema with bronchial thickening. Retained mucus in the dependent trachea. Prominent bilateral hilar lymph nodes are likely reactive. 5 mm nodular focus in the dependent left lower lobe is indeterminate for nodule versus atelectasis.  06/19/18 LDCT chest: Lung-RADS 2, benign appearance or behavior. LLL nodules stable. Continue annual screening with low-dose chest CT without contrast in 12 months.  Virtual Visit via Telephone Note I connected with [name] on 08/14/18 at 10:30 AM EDT by telephone and verified that I am speaking with the correct person using two identifiers. I discussed the limitations, risks, security and privacy concerns of performing an evaluation and management service by telephone and the availability of in person appointments. I also discussed with the patient that there may be a patient responsible charge related to this service. The patient expressed understanding and agreed to proceed.    INTERVAL: Initial consultation 02/16/18 at which time I counseled re: need for smoking cessation and continued Anoro, Combivent and oxygen therapy.  Hospitalized at [facility] in March for COPD exacerbation.   SUBJ:  This is a scheduled follow-up that was performed remotely (via telephone) due to the  coronavirus pandemic.  After his recent hospitalization, he quit smoking for about 2-1/2 months.  However, he has relapsed and is now smoking 5 cigarettes/day.  He attributes his relapse to feeling worried about everything - esp the current pandemic.  In addition, he is struggling with Lupron which he is on for prostate cancer (hot flashes/night sweats, increased worry).  He indicates that he is frustrated with Urology and reports difficulty communicating with their office.  He reports Daily thick phlegm which is clear.  He reports no change in DOE which is moderate to severe.  He underwent CTA chest at the time of his hospitalization.  This study has been reviewed by me and is documented above.  He also underwent LDCT 06/2018 with results documented above.  In addition to all of this, he has been given a new diagnosis of HCV and is on therapy for this.  Presently he remains on Anoro inhaler daily and is using Combivent - 1-2 times per day. He denies CP, fever, purulent sputum, hemoptysis, LE edema and calf tenderness.   OBJ: There were no vitals filed for this visit.   EXAM:  Due to the remote nature of this encounter, no physical exam could be performed  DATA:   BMP Latest Ref Rng & Units 04/19/2018 04/14/2018  Glucose 65 - 99 mg/dL 76 110(H)  BUN 8 - 27 mg/dL 12 9  Creatinine 0.76 - 1.27 mg/dL 0.61(L) 0.49(L)  BUN/Creat Ratio 10 - 24 20 -  Sodium 134 - 144 mmol/L 138 137  Potassium 3.5 - 5.2 mmol/L 4.6 4.0  Chloride 96 - 106 mmol/L 92(L) 95(L)  CO2 20 - 29 mmol/L 35(H) 33(H)  Calcium 8.6 - 10.2 mg/dL 9.4 8.9    CBC  Latest Ref Rng & Units 04/19/2018 04/14/2018  WBC 3.4 - 10.8 x10E3/uL 10.4 11.7(H)  Hemoglobin 13.0 - 17.7 g/dL 13.8 12.9(L)  Hematocrit 37.5 - 51.0 % 43.2 40.2  Platelets 150 - 450 x10E3/uL 489(H) 333    CXR 02/14/18: hyperinflation and hyperlucency consistent with severe emphysema  I have personally reviewed all chest radiographs reported above including CXRs and CT  chest unless otherwise indicated  IMPRESSION:     ICD-10-CM   1. COPD, very severe ([place])  J44.9   2. Smoker  F17.200   3. Metastatic prostate cancer ([place])  C61   4. LLL nodule  R91.1    LLL nodule is too small to be amenable to biopsy. Further, his COPD is so severe that he is not likely to be benefited from an early detection of lung cancer. Therefore, no further evaluation is warranted at this time. He intends to continue annual screening  PLAN:  Continue efforts at smoking cessation as we discussed Since nicotine patches worked for you previously, I recommend that he use these again Continue Anoro inhaler, 1 inhalation daily. Continue Combivent inhaler as needed for increased shortness of breath, wheezing, chest tightness, cough Continue oxygen therapy is close to 24 hours/day as possible Recommended guaifenesin 600 mg twice a day as needed to thin respiratory secretions Follow-up in 3-4 months with Dr. Patsey Berthold.  Call sooner if needed  I indicated to him that I would communicate with Dr Erlene Quan (Urology) to convey his frustration with Lupron therapy so that they might contact to consider treatment options  Merton Border, MD PCCM service Mobile [phone] Pager [phone] 08/14/2018 11:12 AM

## 2018-08-14 NOTE — Patient Instructions (Addendum)
Continue your efforts at smoking cessation as we discussed Since nicotine patches worked for you previously, I recommend that you use these again Continue Anoro inhaler, 1 inhalation daily. Continue Combivent inhaler as needed for increased shortness of breath, wheezing, chest tightness, cough Continue oxygen therapy is close to 24 hours/day as possible Recommend guaifenesin 600 mg twice a day as needed to thin mucus  Follow-up in 3-4 months with Dr. Patsey Berthold.  Call sooner if needed

## 2018-08-15 ENCOUNTER — Encounter: Payer: Self-pay | Admitting: Occupational Therapy

## 2018-08-15 ENCOUNTER — Ambulatory Visit: Payer: Medicare Other | Attending: Nurse Practitioner | Admitting: Occupational Therapy

## 2018-08-15 ENCOUNTER — Other Ambulatory Visit: Payer: Self-pay

## 2018-08-15 DIAGNOSIS — M65352 Trigger finger, left little finger: Secondary | ICD-10-CM | POA: Diagnosis not present

## 2018-08-15 DIAGNOSIS — M25642 Stiffness of left hand, not elsewhere classified: Secondary | ICD-10-CM | POA: Diagnosis not present

## 2018-08-15 DIAGNOSIS — M79642 Pain in left hand: Secondary | ICD-10-CM | POA: Diagnosis not present

## 2018-08-15 NOTE — Patient Instructions (Signed)
2-3 x day  Ice massage over L 5th A1pulley  Wear MC block finger splint - night time and during day with any gripping activities

## 2018-08-15 NOTE — Therapy (Signed)
[facility] [address], [place], [address] Phone: [phone]   Fax:  [phone]  Occupational Therapy Evaluation  Patient Details  Name: Kevin Nelson MRN: [id number] Date of Birth: [birth year] Referring Provider (OT): Marnee Guarneri   Encounter Date: 08/15/2018  OT End of Session - 08/15/18 1429    Visit Number  1    Number of Visits  8    Date for OT Re-Evaluation  09/12/18    OT Start Time  0805    OT Stop Time  0912    OT Time Calculation (min)  67 min    Activity Tolerance  Patient tolerated treatment well    Behavior During Therapy  [facility] for tasks assessed/performed       Past Medical History:  Diagnosis Date  . Basal cell carcinoma   . End stage COPD ([place])   . History of nonmelanoma skin cancer   . Prostate cancer ([place])   . Squamous cell skin cancer     Past Surgical History:  Procedure Laterality Date  . CHOLECYSTECTOMY    . FACIAL LACERATION REPAIR    . SKIN CANCER EXCISION      There were no vitals filed for this visit.  Subjective Assessment - 08/15/18 1422    Subjective   I love to play guitar , and teaching some - but my pinkie hurts so bad in the middle joint -and locks on me down and has to use my other hand to open it - I hope my hand can get better    Patient Stated Goals  I want to be able to use my L hand - and get pinkie better so I can grip and hold objects , play guitar , carve my walking sticks    Currently in Pain?  Yes    Pain Score  9     Pain Location  Finger (Comment which one)    Pain Orientation  Left    Pain Descriptors / Indicators  Aching;Tender    Pain Type  Acute pain    Pain Onset  More than a month ago    Aggravating Factors   gripping and bending pinkie L hand        [facility] OT Assessment - 08/15/18 0001      Assessment   Medical Diagnosis  L 5th digit trigger finger    Referring Provider (OT)  Cannady [name]    Onset Date/Surgical Date  01/18/18     Hand Dominance  Right      Home  Environment   Lives With  Friend(s)      Prior Function   Vocation  Retired    Leisure  play guitar, carve walking sticks, repair guitars ,       Left Hand AROM   L Little  MCP 0-90  80 Degrees    L Little PIP 0-100  75 Degrees              skin check done prior and afterwards - no issues  OT Treatments/Exercises (OP) - 08/15/18 0001      Iontophoresis   Type of Iontophoresis  Dexamethasone    Location  L 5th A1pulley     Dose  small patch , current 2.0     Time  19      LUE Contrast Bath   Time  9 minutes    Comments  prior to review of HEP  [facility] HEP with pt  Hand out provided :    2-3 x day  Ice massage over L 5th A1pulley  Wear MC block finger splint - night time and during day with any gripping activities        OT Education - 08/15/18 1429    Education Details  findings of eval , ionto with dexamethazone explain what to expect ,POC    Person(s) Educated  Patient    Methods  Explanation;Demonstration;Tactile cues;Verbal cues;Handout    Comprehension  Verbal cues required;Returned demonstration;Verbalized understanding       OT Short Term Goals - 08/15/18 1434      OT SHORT TERM GOAL #1   Title  Pain/tenderness over A1pulley of  L 5th digit  improve to  less than 3/10 with palpation and ROM    Baseline  pain and tenderness 8-9/10    Time  3    Period  Weeks    Status  New    Target Date  09/05/18        OT Long Term Goals - 08/15/18 1436      OT LONG TERM GOAL #1   Title  L 5th digit AROM improve to [facility] and touching palm without increase symptoms    Baseline  MC and PIP 80 and 75 degrees- locking or triggering with any tight or sustained grip    Time  4    Period  Weeks    Status  New    Target Date  09/12/18      OT LONG TERM GOAL #2   Title  Pt report less than 2 episodes of triggering in day    Baseline  Several times a day - with gripping, holding and making tight fist    Time  4     Period  Weeks    Status  New    Target Date  09/12/18            Plan - 08/15/18 1430    Clinical Impression Statement  Pt refer to OT/hand therapy for OA in bilateral hands - and pt unable to play guitar - L 5th digit pain and stiffness - during eval pt present with tenderness over A1pulley of L 5th digit- locking or triggering with any tight or sustained grip - pt show decrease ROM and strenght -limiting her functional use of L hand in ADL's and IADL's    OT Occupational Profile and History  Problem Focused Assessment - Including review of records relating to presenting problem    Occupational performance deficits (Please refer to evaluation for details):  ADL's;IADL's;Play;Leisure    Body Structure / Function / Physical Skills  ADL;Flexibility;UE functional use;ROM;Pain;IADL    Rehab Potential  Fair    Clinical Decision Making  Limited treatment options, no task modification necessary    Comorbidities Affecting Occupational Performance:  None    Modification or Assistance to Complete Evaluation   No modification of tasks or assist necessary to complete eval    OT Frequency  2x / week    OT Duration  4 weeks    OT Treatment/Interventions  Self-care/ADL training;Therapeutic exercise;Patient/family education;Splinting;Contrast Bath;Iontophoresis;Manual Therapy    Plan  assess progress with homeprogram - and 2nd session of ionto with dexamethazone    OT Home Exercise Plan  see pt instruction    Consulted and Agree with Plan of Care  Patient       Patient will benefit from skilled therapeutic intervention in order to improve the  following deficits and impairments:   Body Structure / Function / Physical Skills: ADL, Flexibility, UE functional use, ROM, Pain, IADL       Visit Diagnosis: 1. Trigger little finger of left hand   2. Pain in left hand   3. Stiffness of left hand, not elsewhere classified       Problem List Patient Active Problem List   Diagnosis Date Noted  .  Coronary atherosclerosis 07/31/2018  . Chronic hepatitis ([place]) 04/21/2018  . Lung nodule < 6cm on CT 04/19/2018  . Senile purpura ([place]) 04/19/2018  . Protein-calorie malnutrition ([place]) 04/19/2018  . Nicotine dependence, cigarettes, uncomplicated 48/27/0786  . Controlled substance agreement signed 01/27/2018  . COPD, very severe ([place]) 01/13/2018  . Prostate cancer ([place]) 01/13/2018  . History of basal cell carcinoma ([facility]) 01/13/2018  . Generalized anxiety disorder 01/13/2018    Rosalyn Gess OTR/L,CLT 08/15/2018, 2:52 PM  [facility] [address], [place], [address] Phone: [phone]   Fax:  [phone]  Name: [name] MRN: [id number] Date of Birth: 14-Feb-1950

## 2018-08-17 DIAGNOSIS — H903 Sensorineural hearing loss, bilateral: Secondary | ICD-10-CM | POA: Diagnosis not present

## 2018-08-17 DIAGNOSIS — H9313 Tinnitus, bilateral: Secondary | ICD-10-CM | POA: Diagnosis not present

## 2018-08-21 ENCOUNTER — Ambulatory Visit: Payer: Medicare Other | Attending: Nurse Practitioner | Admitting: Occupational Therapy

## 2018-08-21 ENCOUNTER — Encounter: Payer: Self-pay | Admitting: Nurse Practitioner

## 2018-08-21 ENCOUNTER — Other Ambulatory Visit: Payer: Self-pay

## 2018-08-21 DIAGNOSIS — H905 Unspecified sensorineural hearing loss: Secondary | ICD-10-CM | POA: Insufficient documentation

## 2018-08-21 DIAGNOSIS — M79642 Pain in left hand: Secondary | ICD-10-CM | POA: Diagnosis not present

## 2018-08-21 DIAGNOSIS — M65352 Trigger finger, left little finger: Secondary | ICD-10-CM | POA: Insufficient documentation

## 2018-08-21 DIAGNOSIS — M25642 Stiffness of left hand, not elsewhere classified: Secondary | ICD-10-CM | POA: Insufficient documentation

## 2018-08-21 NOTE — Patient Instructions (Signed)
Same - but can do bandaid on thumb to prevent over use in IP flexion or if locking

## 2018-08-21 NOTE — Therapy (Signed)
[facility] [address], [place], [address] Phone: [phone]   Fax:  [phone]  Occupational Therapy Treatment  Patient Details  Name: Kevin Nelson MRN: [id number] Date of Birth: Jun 11, 1950 Referring Provider (OT): Marnee Guarneri   Encounter Date: 08/21/2018  OT End of Session - 08/21/18 0824    Visit Number  2    Number of Visits  8    Date for OT Re-Evaluation  09/12/18    OT Start Time  0808    OT Stop Time  0902    OT Time Calculation (min)  54 min    Activity Tolerance  Patient tolerated treatment well    Behavior During Therapy  [facility] for tasks assessed/performed       Past Medical History:  Diagnosis Date  . Basal cell carcinoma   . End stage COPD ([place])   . History of nonmelanoma skin cancer   . Prostate cancer ([place])   . Squamous cell skin cancer     Past Surgical History:  Procedure Laterality Date  . CHOLECYSTECTOMY    . FACIAL LACERATION REPAIR    . SKIN CANCER EXCISION      There were no vitals filed for this visit.  Subjective Assessment - 08/21/18 0819    Subjective   I forgot last time that my thumb is also painfull and tender like my pinkie - I made my splint colorful -    Patient Stated Goals  I want to be able to use my L hand - and get pinkie better so I can grip and hold objects , play guitar , carve my walking sticks    Currently in Pain?  Yes    Pain Score  10-Worst pain ever    Pain Location  Hand    Pain Orientation  Left    Pain Descriptors / Indicators  Tender;Aching    Pain Type  Acute pain    Aggravating Factors   gripping and bending - pinkie and thumb       Pt arrive with MC block splint on L 5th - cont to be tender 9/10 over A1pulley of 5th -and pt report his also tender over base of thumb - 10/10  But denies any locking or triggering   did notice pt holding phone in L hand - Review with pt to use palm or prop up -and avoid tight and sustained grip with  5th and thumb  Can use band aid on thumb to decrease triggering if occur              OT Treatments/Exercises (OP) - 08/21/18 0001      Iontophoresis   Type of Iontophoresis  Dexamethasone    Location  L 5th  and thumb A1pulley     Dose  small patch , current 2.0     Time  19      LUE Contrast Bath   Time  9 minutes    Comments  prior to soft tissue - decrease pain       after contrast done some graston tool nr 2 brushing over volar 5th and thumb - also over palm and other digits  Prior to extention stretch for wrist and digits combine  Prior to ionto  Skin check done - pt felt thumb stronger - had to decrease current on thumb to 1.6 current  Pt to keep patches on for hour after leaving  OT Education - 08/21/18 [phone]    Education Details  ionto with dexamethazone explain what to expect ,POC    Person(s) Educated  Patient    Methods  Explanation;Demonstration;Tactile cues;Verbal cues;Handout    Comprehension  Verbal cues required;Returned demonstration;Verbalized understanding       OT Short Term Goals - 08/15/18 1434      OT SHORT TERM GOAL #1   Title  Pain/tenderness over A1pulley of  L 5th digit  improve to  less than 3/10 with palpation and ROM    Baseline  pain and tenderness 8-9/10    Time  3    Period  Weeks    Status  New    Target Date  09/05/18        OT Long Term Goals - 08/15/18 1436      OT LONG TERM GOAL #1   Title  L 5th digit AROM improve to [facility] and touching palm without increase symptoms    Baseline  MC and PIP 80 and 75 degrees- locking or triggering with any tight or sustained grip    Time  4    Period  Weeks    Status  New    Target Date  09/12/18      OT LONG TERM GOAL #2   Title  Pt report less than 2 episodes of triggering in day    Baseline  Several times a day - with gripping, holding and making tight fist    Time  4    Period  Weeks    Status  New    Target Date  09/12/18            Plan - 08/21/18 0824     Clinical Impression Statement  Pt cont ot have pain over L 5th A1pulley - but report he forgot to tell me he has pain over base of thumb too - with using it like carving his walking sticks - but he denies triggering - tenderness still 9/10 at 5th and 10/10 per pt on thumb - done thsi date 2nd session of ionto at 5th    OT Occupational Profile and History  Problem Focused Assessment - Including review of records relating to presenting problem    Occupational performance deficits (Please refer to evaluation for details):  ADL's;IADL's;Play;Leisure    Body Structure / Function / Physical Skills  ADL;Flexibility;UE functional use;ROM;Pain;IADL    Rehab Potential  Fair    Clinical Decision Making  Limited treatment options, no task modification necessary    Comorbidities Affecting Occupational Performance:  None    Modification or Assistance to Complete Evaluation   No modification of tasks or assist necessary to complete eval    OT Frequency  2x / week    OT Duration  4 weeks    OT Treatment/Interventions  Self-care/ADL training;Therapeutic exercise;Patient/family education;Splinting;Contrast Bath;Iontophoresis;Manual Therapy    Plan  assess progress with homeprogram - and 2nd session of ionto with dexamethazone    OT Home Exercise Plan  see pt instruction    Consulted and Agree with Plan of Care  Patient       Patient will benefit from skilled therapeutic intervention in order to improve the following deficits and impairments:   Body Structure / Function / Physical Skills: ADL, Flexibility, UE functional use, ROM, Pain, IADL       Visit Diagnosis: 1. Pain in left hand   2. Stiffness of left hand, not elsewhere classified   3. Trigger little finger of left  hand       Problem List Patient Active Problem List   Diagnosis Date Noted  . Coronary atherosclerosis 07/31/2018  . Chronic hepatitis ([place]) 04/21/2018  . Lung nodule < 6cm on CT 04/19/2018  . Senile purpura ([place]) 04/19/2018  .  Protein-calorie malnutrition ([place]) 04/19/2018  . Nicotine dependence, cigarettes, uncomplicated 47/39/5844  . Controlled substance agreement signed 01/27/2018  . COPD, very severe ([place]) 01/13/2018  . Prostate cancer ([place]) 01/13/2018  . History of basal cell carcinoma ([facility]) 01/13/2018  . Generalized anxiety disorder 01/13/2018    Rosalyn Gess OTR/L,CLT 08/21/2018, 9:03 AM  [facility] [address] [place], [address] Phone: [phone]   Fax:  [phone]  Name: Dianne Bady MRN: [id number] Date of Birth: 09/15/1950

## 2018-08-23 ENCOUNTER — Telehealth: Payer: Self-pay | Admitting: Nurse Practitioner

## 2018-08-23 ENCOUNTER — Ambulatory Visit: Payer: Medicare Other | Admitting: Pharmacist

## 2018-08-23 ENCOUNTER — Other Ambulatory Visit: Payer: Self-pay | Admitting: Nurse Practitioner

## 2018-08-23 DIAGNOSIS — J449 Chronic obstructive pulmonary disease, unspecified: Secondary | ICD-10-CM

## 2018-08-23 DIAGNOSIS — F17210 Nicotine dependence, cigarettes, uncomplicated: Secondary | ICD-10-CM

## 2018-08-23 NOTE — Patient Instructions (Signed)
Visit Information  Goals Addressed            This Visit's Progress     Patient Stated   . "I need help getting my medications"  (pt-stated)       Current Barriers:   Patient called clinic noting that Medicare was rejecting his albuterol prescription.   Pharmacist Clinical Goal(s):  [name] Over the next 30 days, patient will work with PharmD and PCP for optimized medication management  Interventions:   [facility]; they noted that the Medicare part B rejection was for an address mismatch. They have the patient's current [facility] address on file  Contacted patient, he noted that when he originally moved here from [facility], he informed Medicare that it would be a temporary move. It may be that Medicare now has his old address back on file. Recommended that he contact Medicare to determine what the mismatch is. He verbalized understanding and stated that he would do this today.   Patient Self Care Activities:  . Currently takes medications as prescribed . Patient does not appropriately contact providers office for refills  Please see past updates related to this goal by clicking on the "Past Updates" button in the selected goal      . "I want to take care of my breathing" (pt-stated)       Current Barriers:  . Unfortunately, with stressors of Hep C treatment, patient has relapsed into smoking. Notes that right now, he is too overwhelmed and not ready to attempt cessation again  Pharmacist Clinical Goal(s):  [name] Over the next 60 days, patient will collaborate with PharmD and PCP to address tobacco cessation  Interventions:  . Provided support and motivational interviewing. Collaboratively decided that I would check in with the patient in September to see if his is ready to address tobacco cessation.   Patient Self Care Activities:  . Patient will continue to think about his readiness to attempt cessation  Please see past updates related to this goal by clicking on the  "Past Updates" button in the selected goal          The patient verbalized understanding of instructions provided today and declined a print copy of patient instruction materials.   Plan:  - Patient will call Medicare as above, and let me know if there are further concerns with filling albuterol - Will outreach patient in 3-5 weeks as discussed for continued medication management follow up  [employer], PharmD Clinical Pharmacist [place] [phone]

## 2018-08-23 NOTE — Telephone Encounter (Signed)
Spoke to patient on telephone, have attached Janci and Catie here.  #1 Janci could you call Dr. Cherrie Gauze office to schedule follow-up for him with Dr. Erlene Quan and lab visit with them.  He reports he has struggled trying to get through to set this up.  If you could help him, he is aware of need for labs and face to face visit, agrees to this with them.  Then call him and let him know date and time, he will write it down in his calendar book.  #2 Catie, he said Medicare is not going to cover his Albuterol anymore and he needs this.  Can we look into this?

## 2018-08-23 NOTE — Telephone Encounter (Signed)
Requested medications are due for refill today?  Yes  Requested medications are on the active medication list?  Yes  Last refill 06/03/2018- #60, 2 reflls  Future visit scheduled?  Yes- 10/31/2018  Notes to clinic   Requested Prescriptions  Pending Prescriptions Disp Refills   clonazePAM (KLONOPIN) 0.5 MG tablet [Pharmacy Med Name: CLONAZEPAM 0.5MG  TABLETS] 60 tablet     Sig: TAKE 1 TABLET BY MOUTH TWICE DAILY AS NEEDED FOR ANXIETY     Not Delegated - Psychiatry:  Anxiolytics/Hypnotics Failed - 08/23/2018  9:31 AM      Failed - This refill cannot be delegated      Failed - Urine Drug Screen completed in last 360 days.      Passed - Valid encounter within last 6 months    Recent Outpatient Visits          3 weeks ago COPD, very severe ([place])   [place], [name] T, NP   4 months ago COPD exacerbation ([place])   [place] Cannady, [name] T, NP   6 months ago COPD, very severe ([place])   [place], [name] T, NP   7 months ago End stage COPD ([place])   [place], [name], NP      Future Appointments            In 2 months Cannady, [name], NP [employer]

## 2018-08-23 NOTE — Chronic Care Management (AMB) (Signed)
  Chronic Care Management   Follow Up Note   08/23/2018 Name: Kevin Nelson MRN: [id number] DOB: Nov 02, 1950  Referred by: [name], NP Reason for referral : Chronic Care Management (Medication Management)   Kevin Nelson is a 68 y.o. year old male who is a primary care patient of Cannady, [name], NP. The CCM team was consulted for assistance with chronic disease management and care coordination needs.    Received message that patient was having medication access concerns.   Review of patient status, including review of consultants reports, relevant laboratory and other test results, and collaboration with appropriate care team members and the patient's provider was performed as part of comprehensive patient evaluation and provision of chronic care management services.    Goals Addressed            This Visit's Progress     Patient Stated   . "I need help getting my medications"  (pt-stated)       Current Barriers:   Patient called clinic noting that Medicare was rejecting his albuterol prescription.   Pharmacist Clinical Goal(s):  [name] Over the next 30 days, patient will work with PharmD and PCP for optimized medication management  Interventions:   [facility]; they noted that the Medicare part B rejection was for an address mismatch. They have the patient's current [facility] address on file  Contacted patient, he noted that when he originally moved here from [facility], he informed Medicare that it would be a temporary move. It may be that Medicare now has his old address back on file. Recommended that he contact Medicare to determine what the mismatch is. He verbalized understanding and stated that he would do this today.   Patient Self Care Activities:  . Currently takes medications as prescribed . Patient does not appropriately contact providers office for refills  Please see past updates related to this goal by clicking on the "Past Updates" button in the  selected goal      . "I want to take care of my breathing" (pt-stated)       Current Barriers:  . Unfortunately, with stressors of Hep C treatment, patient has relapsed into smoking. Notes that right now, he is too overwhelmed and not ready to attempt cessation again  Pharmacist Clinical Goal(s):  [name] Over the next 60 days, patient will collaborate with PharmD and PCP to address tobacco cessation  Interventions:  . Provided support and motivational interviewing. Collaboratively decided that I would check in with the patient in September to see if his is ready to address tobacco cessation.   Patient Self Care Activities:  . Patient will continue to think about his readiness to attempt cessation  Please see past updates related to this goal by clicking on the "Past Updates" button in the selected goal           Plan:  - Patient will call Medicare as above, and let me know if there are further concerns with filling albuterol - Will outreach patient in 3-5 weeks as discussed for continued medication management follow up  [place], PharmD Clinical Pharmacist [place] [phone]

## 2018-08-23 NOTE — Telephone Encounter (Signed)
Controlled substance.  Checked data base and no other refills of controlled substances noted, last fill one month ago.

## 2018-08-23 NOTE — Telephone Encounter (Signed)
Pt is calling and he is having trouble getting his urologist to check his PSA. Pt has had prostate cancer in past. Pt would like to have  psa check periodically. Pt said his PSA is down to 1

## 2018-08-24 ENCOUNTER — Other Ambulatory Visit: Payer: Self-pay

## 2018-08-24 ENCOUNTER — Ambulatory Visit: Payer: Medicare Other | Admitting: Occupational Therapy

## 2018-08-24 ENCOUNTER — Ambulatory Visit (INDEPENDENT_AMBULATORY_CARE_PROVIDER_SITE_OTHER): Payer: Medicare Other | Admitting: *Deleted

## 2018-08-24 DIAGNOSIS — M65352 Trigger finger, left little finger: Secondary | ICD-10-CM

## 2018-08-24 DIAGNOSIS — M79642 Pain in left hand: Secondary | ICD-10-CM

## 2018-08-24 DIAGNOSIS — M25642 Stiffness of left hand, not elsewhere classified: Secondary | ICD-10-CM

## 2018-08-24 DIAGNOSIS — C61 Malignant neoplasm of prostate: Secondary | ICD-10-CM | POA: Diagnosis not present

## 2018-08-24 NOTE — Chronic Care Management (AMB) (Signed)
  Chronic Care Management   Follow Up Note   08/24/2018 Name: Amdrew Oboyle MRN: [id number] DOB: 08/19/1950  Referred by: [name], NP Reason for referral : No chief complaint on file.   Bubba Vanbenschoten is a 68 y.o. year old male who is a primary care patient of Cannady, [name], NP. The CCM team was consulted for assistance with chronic disease management and care coordination needs.    Review of patient status, including review of consultants reports, relevant laboratory and other test results, and collaboration with appropriate care team members and the patient's provider was performed as part of comprehensive patient evaluation and provision of chronic care management services.    Goals Addressed            This Visit's Progress   . I need to see my urologist (pt-stated)       Current Barriers:  [name] Knowledge Deficits related to obtaining an appointment with urology provider . Transportation barriers  Nurse Case Manager Clinical Goal(s):  [name] Over the next 30 days, patient will work with [facility] to address needs related to Urology appt and potential transportation barriers   Interventions:  . Collaborated with [place]  regarding patient appointment, they stated he is scheduled to see Dr. Erlene Quan Sept 1 @ 9:15 for labs and appt.  . Called to speak with patient and he stated he can not go this day. The patient cited several health issues he was dealing with right now and feels feels frustrated. Patient stated he needed the appointment to be rescheduled and preferably with a different provider. . Call placed back to [place] and message left on the nurse line requesting a call back to discuss this.  . Plan will await a call back from office.  . Call back received from [facility] [place]. She was very understanding and was able to change the patient's appointment to be with John Giovanni MD on 9/21 @ 9am to have labs and appt same day.  . Called  patient back to update him on new appointment, patient wrote it in his book and was very thankful for the assistance.  Patient Self Care Activities:  . Currently UNABLE TO independently manage urology appointments related to other health issues going on  . Performs ADL's independently . Performs IADL's independently  Initial goal documentation         The patient has been provided with contact information for the care management team and has been advised to call with any health related questions or concerns.    Merlene Morse Cortnee Steinmiller RN, BSN Nurse Case Editor, [facility]ing [facility]  [phone]) Business Mobile

## 2018-08-24 NOTE — Therapy (Signed)
[place] [facility] [address], [place], [address] Phone: [phone]   Fax:  [phone]  Occupational Therapy Treatment  Patient Details  Name: Kevin Nelson MRN: [id number] Date of Birth: 07-26-1950 Referring Provider (OT): Marnee Guarneri   Encounter Date: 08/24/2018  OT End of Session - 08/24/18 0814    Visit Number  3    Number of Visits  8    Date for OT Re-Evaluation  09/12/18    OT Start Time  0802    OT Stop Time  0854    OT Time Calculation (min)  52 min    Activity Tolerance  Patient tolerated treatment well    Behavior During Therapy  [facility] for tasks assessed/performed       Past Medical History:  Diagnosis Date  . Basal cell carcinoma   . End stage COPD ([place])   . History of nonmelanoma skin cancer   . Prostate cancer ([place])   . Squamous cell skin cancer     Past Surgical History:  Procedure Laterality Date  . CHOLECYSTECTOMY    . FACIAL LACERATION REPAIR    . SKIN CANCER EXCISION      There were no vitals filed for this visit.  Subjective Assessment - 08/24/18 0809    Subjective   Pain still in the pinkie and thumb - that thumb just come on suddenly    Patient Stated Goals  I want to be able to use my L hand - and get pinkie better so I can grip and hold objects , play guitar , carve my walking sticks    Currently in Pain?  Yes    Pain Score  9     Pain Location  Finger (Comment which one)   A1pulley of thumb and 5th diigt   Pain Orientation  Left    Pain Descriptors / Indicators  Tender;Aching    Pain Type  Acute pain        Tenderness same and pain over A1pulley of thumb and 5th on L hand - pt report only doing heat  Ice only one time day   pt to do several times during day ice massage   Tendon glides done - MC flexion WNL - PIP improving  Fisting to one finger out of palm  With some pain at 5th  Thumb Opposition WNL but pain            skin check done - doing well -  no issues   OT Treatments/Exercises (OP) - 08/24/18 0001      Iontophoresis   Type of Iontophoresis  Dexamethasone    Location  L 5th  and thumb A1pulley     Dose  small patch , current 2.0 in 5th , 1.6 on thumb      Time  24      LUE Contrast Bath   Time  9 minutes    Comments  prior to soft tissue       Thumb ionto needed to decrease current - sensitive more - pt to keep patches on again for hour        OT Education - 08/24/18 0814    Education Details  Icing more - pt was doing heat    Person(s) Educated  Patient    Methods  Explanation;Demonstration;Tactile cues;Verbal cues;Handout    Comprehension  Verbal cues required;Returned demonstration;Verbalized understanding       OT Short Term Goals - 08/15/18 1434  OT SHORT TERM GOAL #1   Title  Pain/tenderness over A1pulley of  L 5th digit  improve to  less than 3/10 with palpation and ROM    Baseline  pain and tenderness 8-9/10    Time  3    Period  Weeks    Status  New    Target Date  09/05/18        OT Long Term Goals - 08/15/18 1436      OT LONG TERM GOAL #1   Title  L 5th digit AROM improve to [facility] and touching palm without increase symptoms    Baseline  MC and PIP 80 and 75 degrees- locking or triggering with any tight or sustained grip    Time  4    Period  Weeks    Status  New    Target Date  09/12/18      OT LONG TERM GOAL #2   Title  Pt report less than 2 episodes of triggering in day    Baseline  Several times a day - with gripping, holding and making tight fist    Time  4    Period  Weeks    Status  New    Target Date  09/12/18            Plan - 08/24/18 4627    Clinical Impression Statement  Pt cont to have tenderness and pain over A1pulley of thumb and 5th -tenderness 9/10 - 3rd session of ionto this date - AROM improving - but pt to do more ice at home    OT Occupational Profile and History  Problem Focused Assessment - Including review of records relating to presenting problem     Occupational performance deficits (Please refer to evaluation for details):  ADL's;IADL's;Play;Leisure    Body Structure / Function / Physical Skills  ADL;Flexibility;UE functional use;ROM;Pain;IADL    Rehab Potential  Fair    Clinical Decision Making  Limited treatment options, no task modification necessary    Comorbidities Affecting Occupational Performance:  None    Modification or Assistance to Complete Evaluation   No modification of tasks or assist necessary to complete eval    OT Frequency  2x / week    OT Duration  4 weeks    OT Treatment/Interventions  Self-care/ADL training;Therapeutic exercise;Patient/family education;Splinting;Contrast Bath;Iontophoresis;Manual Therapy    Plan  assess progress with homeprogram - and 3rd session of ionto with dexamethazone    OT Home Exercise Plan  see pt instruction    Consulted and Agree with Plan of Care  Patient       Patient will benefit from skilled therapeutic intervention in order to improve the following deficits and impairments:   Body Structure / Function / Physical Skills: ADL, Flexibility, UE functional use, ROM, Pain, IADL       Visit Diagnosis: 1. Pain in left hand   2. Stiffness of left hand, not elsewhere classified   3. Trigger little finger of left hand       Problem List Patient Active Problem List   Diagnosis Date Noted  . Sensorineural hearing loss (SNHL) 08/21/2018  . Coronary atherosclerosis 07/31/2018  . Chronic hepatitis ([place]) 04/21/2018  . Lung nodule < 6cm on CT 04/19/2018  . Senile purpura ([place]) 04/19/2018  . Protein-calorie malnutrition ([place]) 04/19/2018  . Nicotine dependence, cigarettes, uncomplicated 03/50/0938  . Controlled substance agreement signed 01/27/2018  . COPD, very severe ([place]) 01/13/2018  . Prostate cancer ([place]) 01/13/2018  . History of basal  cell carcinoma (BCC) 01/13/2018  . Generalized anxiety disorder 01/13/2018    Rosalyn Gess OTR/L,CLT 08/24/2018, 8:36 AM  [place] [facility] [address], [place], [address] Phone: [phone]   Fax:  [phone]  Name: Kevin Nelson MRN: [id number] Date of Birth: [birth year]

## 2018-08-24 NOTE — Telephone Encounter (Signed)
Taken care of. Thankyou!  Kevin Nelson

## 2018-08-24 NOTE — Patient Instructions (Signed)
Thank you allowing the Chronic Care Management Team to be a part of your care! It was a pleasure speaking with you today!   CCM (Chronic Care Management) Team   Zoiey Christy RN, BSN Nurse Care Coordinator  [phone]  Catie [facility] PharmD  Clinical Pharmacist  [phone]  Eula Fried LCSW Clinical Social Worker [phone]  Goals Addressed            This Visit's Progress   . I need to see my urologist (pt-stated)       Current Barriers:  [name] Knowledge Deficits related to obtaining an appointment with urology provider . Transportation barriers  Nurse Case Manager Clinical Goal(s):  [name] Over the next 30 days, patient will work with [facility] to address needs related to Urology appt and potential transportation barriers   Interventions:  . Collaborated with [place]  regarding patient appointment, they stated he is scheduled to see Dr. Erlene Quan Sept 1 @ 9:15 for labs and appt.  . Called to speak with patient and he stated he can not go this day. The patient cited several health issues he was dealing with right now and feels feels frustrated. Patient stated he needed the appointment to be rescheduled and preferably with a different provider. . Call placed back to [place] and message left on the nurse line requesting a call back to discuss this.  . Plan will await a call back from office.  . Call back received from [facility] [place]. She was very understanding and was able to change the patient's appointment to be with John Giovanni MD on 9/21 @ 9am to have labs and appt same day.  . Called patient back to update him on new appointment, patient wrote it in his book and was very thankful for the assistance.  Patient Self Care Activities:  . Currently UNABLE TO independently manage urology appointments related to other health issues going on  . Performs ADL's independently . Performs IADL's independently  Initial goal documentation         The patient verbalized understanding of instructions provided today and declined a print copy of patient instruction materials.   The patient has been provided with contact information for the care management team and has been advised to call with any health related questions or concerns.

## 2018-08-29 ENCOUNTER — Other Ambulatory Visit: Payer: Self-pay

## 2018-08-29 ENCOUNTER — Ambulatory Visit: Payer: Medicare Other | Admitting: Occupational Therapy

## 2018-08-29 DIAGNOSIS — M65352 Trigger finger, left little finger: Secondary | ICD-10-CM

## 2018-08-29 DIAGNOSIS — M25642 Stiffness of left hand, not elsewhere classified: Secondary | ICD-10-CM

## 2018-08-29 DIAGNOSIS — M79642 Pain in left hand: Secondary | ICD-10-CM | POA: Diagnosis not present

## 2018-08-29 NOTE — Therapy (Signed)
[place] [facility] [address], [place], [address] Phone: [phone]   Fax:  [phone]  Occupational Therapy Treatment  Patient Details  Name: Kevin Nelson MRN: [id number] Date of Birth: 13-Jan-1951 Referring Provider (OT): Marnee Guarneri   Encounter Date: 08/29/2018  OT End of Session - 08/29/18 1019    Visit Number  4    Number of Visits  8    Date for OT Re-Evaluation  09/12/18    OT Start Time  0840    OT Stop Time  0930    OT Time Calculation (min)  50 min    Activity Tolerance  Patient tolerated treatment well    Behavior During Therapy  [facility] for tasks assessed/performed       Past Medical History:  Diagnosis Date  . Basal cell carcinoma   . End stage COPD ([place])   . History of nonmelanoma skin cancer   . Prostate cancer ([place])   . Squamous cell skin cancer     Past Surgical History:  Procedure Laterality Date  . CHOLECYSTECTOMY    . FACIAL LACERATION REPAIR    . SKIN CANCER EXCISION      There were no vitals filed for this visit.  Subjective Assessment - 08/29/18 1016    Subjective   Pinkie still wants to lock at times -but tenderness better- and thumb still hurts bad - but I working on 2 guitars - and used my hands a lot again    Patient Stated Goals  I want to be able to use my L hand - and get pinkie better so I can grip and hold objects , play guitar , carve my walking sticks    Currently in Pain?  Yes    Pain Score  9     Pain Location  Finger (Comment which one)   L thumb and 5th   Pain Orientation  Left    Pain Descriptors / Indicators  Tender    Pain Type  Acute pain       Pt's arrive with pain and tenderness still 9/10 over A1pulley of thumb  But 5th decrease - but during session 5th did lock one time  Also showing decrease AROM in 5th digit            OT Treatments/Exercises (OP) - 08/29/18 0001      LUE Paraffin   Number Minutes Paraffin  8 Minutes    LUE  Paraffin Location  Hand    Comments  prior to soft tissue and AROM       soft tissue mobs done -and graston tool nr 2 for sweeping and brushing over 5th and thumb- webspace and volar digit Ionto done for 8 min - 2 small patches on thumb and 5th L digits A1pulleys - at 2.0 current - tolerated well -but then he ran out of O2 - and had to leave - his Sats was dropping   did call Dr Pauline Aus office to get pt appt on Monday for US guided shots possibly        OT Education - 08/29/18 1019    Education Details  Icing more - pt was doing heat    Person(s) Educated  Patient    Methods  Explanation;Demonstration;Tactile cues;Verbal cues;Handout    Comprehension  Verbal cues required;Returned demonstration;Verbalized understanding       OT Short Term Goals - 08/15/18 1434      OT SHORT TERM GOAL #1  Title  Pain/tenderness over A1pulley of  L 5th digit  improve to  less than 3/10 with palpation and ROM    Baseline  pain and tenderness 8-9/10    Time  3    Period  Weeks    Status  New    Target Date  09/05/18        OT Long Term Goals - 08/15/18 1436      OT LONG TERM GOAL #1   Title  L 5th digit AROM improve to [facility] and touching palm without increase symptoms    Baseline  MC and PIP 80 and 75 degrees- locking or triggering with any tight or sustained grip    Time  4    Period  Weeks    Status  New    Target Date  09/12/18      OT LONG TERM GOAL #2   Title  Pt report less than 2 episodes of triggering in day    Baseline  Several times a day - with gripping, holding and making tight fist    Time  4    Period  Weeks    Status  New    Target Date  09/12/18            Plan - 08/29/18 1019    Clinical Impression Statement  Pt cont to have tenderness and pain over A1pulley of thumb and 5th -tenderness 9/10at thumb - butter at 5th - but in sessoin 5th locked with blocked intrinsic fist - made appt for pt with Dr Candelaria Stagers for possible US guided shot to L  thumb and 5th on  Monday - 4th session of ionto done for about 8 min when pt's ran out of O2 - and had to leave - his Sats was dropping    OT Occupational Profile and History  Problem Focused Assessment - Including review of records relating to presenting problem    Occupational performance deficits (Please refer to evaluation for details):  ADL's;IADL's;Play;Leisure    Body Structure / Function / Physical Skills  ADL;Flexibility;UE functional use;ROM;Pain;IADL    Rehab Potential  Fair    Clinical Decision Making  Limited treatment options, no task modification necessary    Comorbidities Affecting Occupational Performance:  None    Modification or Assistance to Complete Evaluation   No modification of tasks or assist necessary to complete eval    OT Frequency  2x / week    OT Duration  2 weeks    OT Treatment/Interventions  Self-care/ADL training;Therapeutic exercise;Patient/family education;Splinting;Contrast Bath;Iontophoresis;Manual Therapy    Plan  assess progress with homeprogram - and 5th  session of ionto with dexamethazone    OT Home Exercise Plan  see pt instruction    Consulted and Agree with Plan of Care  Patient       Patient will benefit from skilled therapeutic intervention in order to improve the following deficits and impairments:   Body Structure / Function / Physical Skills: ADL, Flexibility, UE functional use, ROM, Pain, IADL       Visit Diagnosis: 1. Stiffness of left hand, not elsewhere classified   2. Trigger little finger of left hand   3. Pain in left hand       Problem List Patient Active Problem List   Diagnosis Date Noted  . Sensorineural hearing loss (SNHL) 08/21/2018  . Coronary atherosclerosis 07/31/2018  . Chronic hepatitis ([place]) 04/21/2018  . Lung nodule < 6cm on CT 04/19/2018  . Senile purpura ([place]) 04/19/2018  .  Protein-calorie malnutrition ([place]) 04/19/2018  . Nicotine dependence, cigarettes, uncomplicated 73/41/9379  . Controlled substance agreement signed  01/27/2018  . COPD, very severe ([place]) 01/13/2018  . Prostate cancer ([place]) 01/13/2018  . History of basal cell carcinoma (BCC) 01/13/2018  . Generalized anxiety disorder 01/13/2018    Rosalyn Gess OTR/L,CLT 08/29/2018, 10:22 AM  [place] [facility] [address] [place], [address] Phone: [phone]   Fax:  [phone]  Name: Kevin Nelson MRN: [id number] Date of Birth: [birth year]

## 2018-08-29 NOTE — Patient Instructions (Signed)
Pt to cont with ice massage  avoid tight grips, built up grips  wear 5th MC block splint   Ice massage over A1pulley at thumb and 5th during day

## 2018-08-30 ENCOUNTER — Telehealth: Payer: Self-pay

## 2018-08-31 ENCOUNTER — Other Ambulatory Visit: Payer: Self-pay

## 2018-08-31 ENCOUNTER — Ambulatory Visit: Payer: Medicare Other | Admitting: Occupational Therapy

## 2018-08-31 DIAGNOSIS — M65352 Trigger finger, left little finger: Secondary | ICD-10-CM

## 2018-08-31 DIAGNOSIS — M25642 Stiffness of left hand, not elsewhere classified: Secondary | ICD-10-CM | POA: Diagnosis not present

## 2018-08-31 DIAGNOSIS — M79642 Pain in left hand: Secondary | ICD-10-CM | POA: Diagnosis not present

## 2018-08-31 NOTE — Patient Instructions (Signed)
Gentle AROM without locking for thumb IP flexion , opposition  And intrinsic fist for 5th  Ice massage

## 2018-08-31 NOTE — Therapy (Signed)
[place] [facility] [address] [place], [address] Phone: [phone]   Fax:  [phone]  Occupational Therapy Treatment  Patient Details  Name: Kevin Nelson MRN: [id number] Date of Birth: 28-Jun-1950 Referring Provider (OT): Marnee Guarneri   Encounter Date: 08/31/2018  OT End of Session - 08/31/18 0859    Visit Number  5    Number of Visits  8    Date for OT Re-Evaluation  09/12/18    OT Start Time  0850    OT Stop Time  0940    OT Time Calculation (min)  50 min    Activity Tolerance  Patient tolerated treatment well    Behavior During Therapy  [facility] for tasks assessed/performed       Past Medical History:  Diagnosis Date  . Basal cell carcinoma   . End stage COPD ([place])   . History of nonmelanoma skin cancer   . Prostate cancer ([place])   . Squamous cell skin cancer     Past Surgical History:  Procedure Laterality Date  . CHOLECYSTECTOMY    . FACIAL LACERATION REPAIR    . SKIN CANCER EXCISION      There were no vitals filed for this visit.  Subjective Assessment - 08/31/18 0857    Subjective   I worked on that AROM for my thumb and pinkie - and I think it is better - thank you for making my appt on Monday -stil tender but better than it was    Patient Stated Goals  I want to be able to use my L hand - and get pinkie better so I can grip and hold objects , play guitar , carve my walking sticks    Currently in Pain?  Yes    Pain Score  9     Pain Location  Finger (Comment which one)   5th and thumb A1pulley   Pain Orientation  Left    Pain Descriptors / Indicators  Tightness;Tender    Pain Type  Acute pain    Pain Onset  More than a month ago       AROM of thumb to base of 5th  And Flexion of 5th improve to touching palm - with less pain than in past  Tenderness per pt improve - but when push on A1pulley of 5th and thumb still 9/10 per pt  Pt refer to Dr Candelaria Stagers - appt for MOnday made              OT Treatments/Exercises (OP) - 08/31/18 0001      Moist Heat Therapy   Number Minutes Moist Heat  8 Minutes    Moist Heat Location  Hand      Iontophoresis   Type of Iontophoresis  Dexamethasone    Location  L 5th  and thumb A1pulley     Dose  small patch at 2.0 current     Time  19 min         soft tissue mobs done -and graston tool nr 2 for sweeping and brushing over 5th and thumb- webspace and volar digit AAROM and PROM for thumb and 5th digit composite flexion done - no locking this date   Skin check done prior and afterwards - no issues - pt to see MD for possible shot      OT Education - 08/31/18 0859    Education Details  HEP ,and to rest hand after shot on Monday  Person(s) Educated  Patient    Methods  Explanation;Demonstration;Tactile cues;Verbal cues;Handout    Comprehension  Verbal cues required;Returned demonstration;Verbalized understanding       OT Short Term Goals - 08/31/18 0927      OT SHORT TERM GOAL #1   Title  Pain/tenderness over A1pulley of  L 5th digit  improve to  less than 3/10 with palpation and ROM    Baseline  improving but still 8-9/10 at thumb    Status  Partially Met    Target Date  08/31/18        OT Long Term Goals - 08/31/18 0927      OT LONG TERM GOAL #1   Title  L 5th digit AROM improve to [facility] and touching palm without increase symptoms    Baseline  MC and PIP 80 and 75 degrees- locking or triggering with any tight or sustained grip- can touch palm this date and composite opposition - pain at end range    Status  Partially Met    Target Date  08/31/18      OT LONG TERM GOAL #2   Title  Pt report less than 2 episodes of triggering in day    Baseline  still triggering and pain  with tight fist    Status  Not Met    Target Date  08/31/18            Plan - 08/31/18 0859    Clinical Impression Statement  Pt was seen for 5 visits - improving but discuss last session with pt to see DR KUbinski for  shot for thumb and 5th digit- pt to not over do his activities after shot - he carves walking sticks and fix/play guitar - pt can do gentle AROM for thumb and tendon glides as long as pain is good and no locking    OT Occupational Profile and History  Problem Focused Assessment - Including review of records relating to presenting problem    Occupational performance deficits (Please refer to evaluation for details):  ADL's;IADL's;Play;Leisure    Body Structure / Function / Physical Skills  ADL;Flexibility;UE functional use;ROM;Pain;IADL    Rehab Potential  Fair    Clinical Decision Making  Limited treatment options, no task modification necessary    Comorbidities Affecting Occupational Performance:  None    Modification or Assistance to Complete Evaluation   No modification of tasks or assist necessary to complete eval    OT Treatment/Interventions  Self-care/ADL training;Therapeutic exercise;Patient/family education;Splinting;Contrast Bath;Iontophoresis;Manual Therapy    Plan  pt to see Dr Candelaria Stagers for possible shot trigger thumb and 5th on L    OT Home Exercise Plan  see pt instruction    Consulted and Agree with Plan of Care  Patient       Patient will benefit from skilled therapeutic intervention in order to improve the following deficits and impairments:   Body Structure / Function / Physical Skills: ADL, Flexibility, UE functional use, ROM, Pain, IADL       Visit Diagnosis: 1. Stiffness of left hand, not elsewhere classified   2. Trigger little finger of left hand   3. Pain in left hand       Problem List Patient Active Problem List   Diagnosis Date Noted  . Sensorineural hearing loss (SNHL) 08/21/2018  . Coronary atherosclerosis 07/31/2018  . Chronic hepatitis ([place]) 04/21/2018  . Lung nodule < 6cm on CT 04/19/2018  . Senile purpura ([place]) 04/19/2018  . Protein-calorie malnutrition ([place]) 04/19/2018  .  Nicotine dependence, cigarettes, uncomplicated 73/75/0510  . Controlled  substance agreement signed 01/27/2018  . COPD, very severe ([place]) 01/13/2018  . Prostate cancer ([place]) 01/13/2018  . History of basal cell carcinoma (BCC) 01/13/2018  . Generalized anxiety disorder 01/13/2018    Rosalyn Gess OTR/L,CLT 08/31/2018, 1:25 PM  [place] [facility] [address] [place], [address] Phone: [phone]   Fax:  [phone]  Name: Josias Tomerlin MRN: [id number] Date of Birth: 1950-11-24

## 2018-09-01 ENCOUNTER — Telehealth: Payer: Self-pay

## 2018-09-04 DIAGNOSIS — M65312 Trigger thumb, left thumb: Secondary | ICD-10-CM | POA: Diagnosis not present

## 2018-09-04 DIAGNOSIS — M65352 Trigger finger, left little finger: Secondary | ICD-10-CM | POA: Diagnosis not present

## 2018-09-04 DIAGNOSIS — M25542 Pain in joints of left hand: Secondary | ICD-10-CM | POA: Diagnosis not present

## 2018-09-19 ENCOUNTER — Ambulatory Visit: Payer: Medicare Other | Admitting: Urology

## 2018-09-29 ENCOUNTER — Inpatient Hospital Stay
Admission: EM | Admit: 2018-09-29 | Discharge: 2018-10-01 | DRG: 190 | Disposition: A | Discharge status: Home / Self Care | Payer: Medicare Other | Attending: Internal Medicine | Admitting: Internal Medicine

## 2018-09-29 ENCOUNTER — Emergency Department: Payer: Medicare Other

## 2018-09-29 ENCOUNTER — Other Ambulatory Visit: Payer: Self-pay

## 2018-09-29 ENCOUNTER — Encounter: Payer: Self-pay | Admitting: Nurse Practitioner

## 2018-09-29 ENCOUNTER — Ambulatory Visit (INDEPENDENT_AMBULATORY_CARE_PROVIDER_SITE_OTHER): Payer: Medicare Other | Admitting: Nurse Practitioner

## 2018-09-29 DIAGNOSIS — J449 Chronic obstructive pulmonary disease, unspecified: Secondary | ICD-10-CM

## 2018-09-29 DIAGNOSIS — J9621 Acute and chronic respiratory failure with hypoxia: Secondary | ICD-10-CM | POA: Diagnosis not present

## 2018-09-29 DIAGNOSIS — Z20828 Contact with and (suspected) exposure to other viral communicable diseases: Secondary | ICD-10-CM | POA: Diagnosis not present

## 2018-09-29 DIAGNOSIS — Z9981 Dependence on supplemental oxygen: Secondary | ICD-10-CM

## 2018-09-29 DIAGNOSIS — F17210 Nicotine dependence, cigarettes, uncomplicated: Secondary | ICD-10-CM | POA: Diagnosis present

## 2018-09-29 DIAGNOSIS — R0902 Hypoxemia: Secondary | ICD-10-CM | POA: Diagnosis not present

## 2018-09-29 DIAGNOSIS — R069 Unspecified abnormalities of breathing: Secondary | ICD-10-CM | POA: Diagnosis not present

## 2018-09-29 DIAGNOSIS — J441 Chronic obstructive pulmonary disease with (acute) exacerbation: Principal | ICD-10-CM | POA: Diagnosis present

## 2018-09-29 DIAGNOSIS — R0689 Other abnormalities of breathing: Secondary | ICD-10-CM | POA: Diagnosis not present

## 2018-09-29 DIAGNOSIS — Z8546 Personal history of malignant neoplasm of prostate: Secondary | ICD-10-CM | POA: Diagnosis not present

## 2018-09-29 DIAGNOSIS — J189 Pneumonia, unspecified organism: Secondary | ICD-10-CM | POA: Diagnosis not present

## 2018-09-29 DIAGNOSIS — Z66 Do not resuscitate: Secondary | ICD-10-CM | POA: Diagnosis not present

## 2018-09-29 DIAGNOSIS — R0602 Shortness of breath: Secondary | ICD-10-CM | POA: Diagnosis not present

## 2018-09-29 DIAGNOSIS — Z79899 Other long term (current) drug therapy: Secondary | ICD-10-CM

## 2018-09-29 DIAGNOSIS — Z85828 Personal history of other malignant neoplasm of skin: Secondary | ICD-10-CM

## 2018-09-29 DIAGNOSIS — F419 Anxiety disorder, unspecified: Secondary | ICD-10-CM | POA: Diagnosis not present

## 2018-09-29 LAB — COMPREHENSIVE METABOLIC PANEL
ALT: 12 U/L (ref 0–44)
AST: 16 U/L (ref 15–41)
Albumin: 3.7 g/dL (ref 3.5–5.0)
Alkaline Phosphatase: 61 U/L (ref 38–126)
Anion gap: 9 (ref 5–15)
BUN: 8 mg/dL (ref 8–23)
CO2: 35 mmol/L — ABNORMAL HIGH (ref 22–32)
Calcium: 8.8 mg/dL — ABNORMAL LOW (ref 8.9–10.3)
Chloride: 93 mmol/L — ABNORMAL LOW (ref 98–111)
Creatinine, Ser: 0.39 mg/dL — ABNORMAL LOW (ref 0.61–1.24)
GFR calc Af Amer: >60 mL/min (ref >60)
GFR calc non Af Amer: >60 mL/min (ref >60)
Glucose, Bld: 110 mg/dL — ABNORMAL HIGH (ref 70–99)
Potassium: 3.8 mmol/L (ref 3.5–5.1)
Sodium: 137 mmol/L (ref 135–145)
Total Bilirubin: 0.4 mg/dL (ref 0.3–1.2)
Total Protein: 6.6 g/dL (ref 6.5–8.1)

## 2018-09-29 LAB — CBC WITH DIFFERENTIAL/PLATELET
Abs Immature Granulocytes: 0.04 10*3/uL (ref 0.00–0.07)
Basophils Absolute: 0.1 10*3/uL (ref 0.0–0.1)
Basophils Relative: 1 %
Eosinophils Absolute: 0.3 10*3/uL (ref 0.0–0.5)
Eosinophils Relative: 2 %
HCT: 38.4 % — ABNORMAL LOW (ref 39.0–52.0)
Hemoglobin: 12.2 g/dL — ABNORMAL LOW (ref 13.0–17.0)
Immature Granulocytes: 0 %
Lymphocytes Relative: 10 %
Lymphs Abs: 1.4 10*3/uL (ref 0.7–4.0)
MCH: 28.8 pg (ref 26.0–34.0)
MCHC: 31.8 g/dL (ref 30.0–36.0)
MCV: 90.8 fL (ref 80.0–100.0)
Monocytes Absolute: 1.4 10*3/uL — ABNORMAL HIGH (ref 0.1–1.0)
Monocytes Relative: 10 %
Neutro Abs: 11 10*3/uL — ABNORMAL HIGH (ref 1.7–7.7)
Neutrophils Relative %: 77 %
Platelets: 384 10*3/uL (ref 150–400)
RBC: 4.23 MIL/uL (ref 4.22–5.81)
RDW: 12.9 % (ref 11.5–15.5)
WBC: 14.1 10*3/uL — ABNORMAL HIGH (ref 4.0–10.5)
nRBC: 0 % (ref 0.0–0.2)

## 2018-09-29 LAB — SARS CORONAVIRUS 2 BY RT PCR (HOSPITAL ORDER, PERFORMED IN ~~LOC~~ HOSPITAL LAB): SARS Coronavirus 2: NEGATIVE

## 2018-09-29 LAB — BRAIN NATRIURETIC PEPTIDE: B Natriuretic Peptide: 43 pg/mL (ref 0.0–100.0)

## 2018-09-29 MED ORDER — METHYLPREDNISOLONE SODIUM SUCC 125 MG IJ SOLR
125.0000 mg | Freq: Once | INTRAMUSCULAR | Status: AC
  Administered 2018-09-29: 125 mg via INTRAVENOUS
  Filled 2018-09-29: qty 2

## 2018-09-29 MED ORDER — ACETAMINOPHEN 650 MG RE SUPP: 650.0000 mg | Freq: Four times a day (QID) | RECTAL | Status: DC | PRN

## 2018-09-29 MED ORDER — ENOXAPARIN SODIUM 40 MG/0.4ML ~~LOC~~ SOLN
40.0000 mg | SUBCUTANEOUS | Status: DC
  Administered 2018-09-29 – 2018-09-30 (×2): 40 mg via SUBCUTANEOUS
  Filled 2018-09-29 (×2): qty 0.4

## 2018-09-29 MED ORDER — ALBUTEROL SULFATE (2.5 MG/3ML) 0.083% IN NEBU
10.0000 mg/h | INHALATION_SOLUTION | Freq: Once | RESPIRATORY_TRACT | Status: AC
  Administered 2018-09-29: 10 mg/h via RESPIRATORY_TRACT
  Filled 2018-09-29: qty 12

## 2018-09-29 MED ORDER — ONDANSETRON HCL 4 MG/2ML IJ SOLN: 4.0000 mg | Freq: Once | INTRAMUSCULAR | Status: DC

## 2018-09-29 MED ORDER — SODIUM CHLORIDE 0.9 % IV SOLN
100.0000 mg | Freq: Once | INTRAVENOUS | Status: AC
  Administered 2018-09-29: 100 mg via INTRAVENOUS
  Filled 2018-09-29: qty 100

## 2018-09-29 MED ORDER — DOXYCYCLINE HYCLATE 100 MG PO TABS
100.0000 mg | ORAL_TABLET | Freq: Two times a day (BID) | ORAL | Status: DC
  Administered 2018-09-30 – 2018-10-01 (×3): 100 mg via ORAL
  Filled 2018-09-29 (×3): qty 1

## 2018-09-29 MED ORDER — CLONAZEPAM 0.5 MG PO TABS
0.5000 mg | ORAL_TABLET | Freq: Two times a day (BID) | ORAL | Status: DC | PRN
  Administered 2018-09-29 – 2018-10-01 (×3): 0.5 mg via ORAL
  Filled 2018-09-29 (×3): qty 1

## 2018-09-29 MED ORDER — SODIUM CHLORIDE 0.9 % IV BOLUS
1000.0000 mL | Freq: Once | INTRAVENOUS | Status: AC
  Administered 2018-09-29: 1000 mL via INTRAVENOUS

## 2018-09-29 MED ORDER — ONDANSETRON HCL 4 MG PO TABS: 4.0000 mg | ORAL_TABLET | Freq: Four times a day (QID) | ORAL | Status: DC | PRN

## 2018-09-29 MED ORDER — ONDANSETRON HCL 4 MG/2ML IJ SOLN: 4.0000 mg | Freq: Four times a day (QID) | INTRAMUSCULAR | Status: DC | PRN

## 2018-09-29 MED ORDER — IPRATROPIUM-ALBUTEROL 0.5-2.5 (3) MG/3ML IN SOLN
3.0000 mL | Freq: Four times a day (QID) | RESPIRATORY_TRACT | Status: DC
  Administered 2018-09-29 – 2018-10-01 (×7): 3 mL via RESPIRATORY_TRACT
  Filled 2018-09-29 (×7): qty 3

## 2018-09-29 MED ORDER — IPRATROPIUM BROMIDE 0.02 % IN SOLN
1.0000 mg | Freq: Once | RESPIRATORY_TRACT | Status: AC
  Administered 2018-09-29: 1 mg via RESPIRATORY_TRACT
  Filled 2018-09-29: qty 5

## 2018-09-29 MED ORDER — ACETAMINOPHEN 325 MG PO TABS: 650.0000 mg | ORAL_TABLET | Freq: Four times a day (QID) | ORAL | Status: DC | PRN

## 2018-09-29 MED ORDER — BUDESONIDE 0.5 MG/2ML IN SUSP
0.5000 mg | Freq: Two times a day (BID) | RESPIRATORY_TRACT | Status: DC
  Administered 2018-09-29 – 2018-10-01 (×4): 0.5 mg via RESPIRATORY_TRACT
  Filled 2018-09-29 (×4): qty 2

## 2018-09-29 MED ORDER — ACETAMINOPHEN 325 MG PO TABS: 650.0000 mg | ORAL_TABLET | Freq: Once | ORAL | Status: DC

## 2018-09-29 MED ORDER — METHYLPREDNISOLONE SODIUM SUCC 40 MG IJ SOLR
40.0000 mg | Freq: Four times a day (QID) | INTRAMUSCULAR | Status: DC
  Administered 2018-09-29 – 2018-09-30 (×4): 40 mg via INTRAVENOUS
  Filled 2018-09-29 (×4): qty 1

## 2018-09-29 NOTE — Assessment & Plan Note (Addendum)
Due to hypotension (baseline 100's/70's), along with increased O2 needs with lower sats + productive cough have recommended ER visit today.  High risk for decompensation due to severe COPD.  He agrees with this plan of care and wanted to discuss with provider first.  Plans on going to ER for further assessment.

## 2018-09-29 NOTE — H&P (Signed)
[facility] NAME: Kevin Nelson    MR#:  [id number]  DATE OF BIRTH:  [birth year]  DATE OF ADMISSION:  09/29/2018  PRIMARY CARE PHYSICIAN: [name], NP   REQUESTING/REFERRING PHYSICIAN: Dr. Lindell Noe.   CHIEF COMPLAINT:   Chief Complaint  Patient presents with  . Shortness of Breath    HISTORY OF PRESENT ILLNESS:  Kevin Nelson  is a 68 y.o. male with a known history of end-stage COPD, prostate cancer, history of squamous cell skin cancer who presents to the hospital complaining of worsening shortness of breath.  Patient has chronic respiratory failure from his end-stage COPD and is normally on 2.5 L of oxygen continuously but over the past week patient has had progressive shortness of breath and more exertional dyspnea.  He admits to a cough which is productive with yellow-colored sputum.  He admits to some chills but that is contributed to the Lupron he has been getting for his prostate cancer but denies any documented fever.  He denies any nausea vomiting abdominal pain, headache dizziness or any other associated symptoms.  Patient's COVID-19 test is negative.  He presents to the hospital due to worsening respiratory distress secondary to COPD exacerbation and hospitalist services were contacted for admission.  Patient was noted to be hypoxic when EMS arrived to his house around 80% as per the patient.  PAST MEDICAL HISTORY:   Past Medical History:  Diagnosis Date  . Basal cell carcinoma   . End stage COPD ([place])   . History of nonmelanoma skin cancer   . Prostate cancer ([place])   . Squamous cell skin cancer     PAST SURGICAL HISTORY:   Past Surgical History:  Procedure Laterality Date  . CHOLECYSTECTOMY    . FACIAL LACERATION REPAIR    . SKIN CANCER EXCISION      SOCIAL HISTORY:   Social History   Tobacco Use  . Smoking status: Current Every Day Smoker    Packs/day: 0.50    Years: 51.00    Pack years: 25.50     Types: Cigarettes    Last attempt to quit: 04/14/2018    Years since quitting: 0.4  . Smokeless tobacco: Never Used  Substance Use Topics  . Alcohol use: Yes    Alcohol/week: 3.0 standard drinks    Types: 3 Cans of beer per week    Comment: last drink 04/13/18    FAMILY HISTORY:   Family History  Problem Relation Age of Onset  . Cancer Sister   . Cancer - Cervical Mother   . Heart attack Brother   . Heart attack Maternal Grandfather     DRUG ALLERGIES:  No Known Allergies  REVIEW OF SYSTEMS:   Review of Systems  Constitutional: Positive for chills. Negative for fever and weight loss.  HENT: Negative for congestion, nosebleeds and tinnitus.   Eyes: Negative for blurred vision, double vision and redness.  Respiratory: Positive for cough, sputum production and shortness of breath. Negative for hemoptysis.   Cardiovascular: Negative for chest pain, orthopnea, leg swelling and PND.  Gastrointestinal: Negative for abdominal pain, diarrhea, melena, nausea and vomiting.  Genitourinary: Negative for dysuria, hematuria and urgency.  Musculoskeletal: Negative for falls and joint pain.  Neurological: Negative for dizziness, tingling, sensory change, focal weakness, seizures, weakness and headaches.  Endo/Heme/Allergies: Negative for polydipsia. Does not bruise/bleed easily.  Psychiatric/Behavioral: Negative for depression and memory loss. The patient is not nervous/anxious.  MEDICATIONS AT HOME:   Prior to Admission medications   Medication Sig Start Date End Date Taking? Authorizing Provider  albuterol (PROVENTIL) (2.5 MG/3ML) 0.083% nebulizer solution USE 1 VIAL(3 MLS) BY NEBULIZATION EVERY 6 HOURS AS NEEDED FOR WHEEZING OR SHORTNESS OF BREATH Patient taking differently: Take 2.5 mg by nebulization 2 (two) times daily.  06/05/18  Yes Cannady, [name] T, NP  clonazePAM (KLONOPIN) 0.5 MG tablet TAKE 1 TABLET BY MOUTH TWICE DAILY AS NEEDED FOR ANXIETY Patient taking differently:  Take 0.5 mg by mouth 2 (two) times daily.  08/23/18  Yes Cannady, [name] T, NP  Ipratropium-Albuterol (COMBIVENT RESPIMAT) 20-100 MCG/ACT AERS respimat Inhale 1 puff into the lungs every 6 (six) hours as needed. 05/31/18  Yes Cannady, [name] T, NP  Tiotropium Bromide-Olodaterol (STIOLTO RESPIMAT) 2.5-2.5 MCG/ACT AERS Inhale 2 puffs into the lungs daily. 05/31/18  Yes Cannady, [name] T, NP      VITAL SIGNS:  Blood pressure 129/66, pulse 90, temperature 98.5 F (36.9 C), temperature source Oral, resp. rate (!) 27, height 5\' 7"  (1.702 m), weight 50.8 kg, SpO2 100 %.  PHYSICAL EXAMINATION:  Physical Exam  GENERAL:  68 y.o.-year-old thin patient lying in the bed in no acute distress.  EYES: Pupils equal, round, reactive to light and accommodation. No scleral icterus. Extraocular muscles intact.  HEENT: Head atraumatic, normocephalic. Oropharynx and nasopharynx clear. No oropharyngeal erythema, moist oral mucosa  NECK:  Supple, no jugular venous distention. No thyroid enlargement, no tenderness.  LUNGS: Prolonged inspiratory and expiratory phase, minimal end expiratory wheezing bilaterally, no rhonchi, rales.  Negative use of accessory muscles.[name]  CARDIOVASCULAR: S1, S2 RRR. No murmurs, rubs, gallops, clicks.  ABDOMEN: Soft, nontender, nondistended. Bowel sounds present. No organomegaly or mass.  EXTREMITIES: No pedal edema, cyanosis, or clubbing. + 2 pedal & radial pulses b/l.   NEUROLOGIC: Cranial nerves II through XII are intact. No focal Motor or sensory deficits appreciated b/l PSYCHIATRIC: The patient is alert and oriented x 3.  SKIN: No obvious rash, lesion, or ulcer.   LABORATORY PANEL:   CBC Recent Labs  Lab 09/29/18 1243  WBC 14.1*  HGB 12.2*  HCT 38.4*  PLT 384   ------------------------------------------------------------------------------------------------------------------  Chemistries  Recent Labs  Lab 09/29/18 1243  NA 137  K 3.8  CL 93*  CO2 35*  GLUCOSE 110*  BUN  8  CREATININE 0.39*  CALCIUM 8.8*  AST 16  ALT 12  ALKPHOS 61  BILITOT 0.4   ------------------------------------------------------------------------------------------------------------------  Cardiac Enzymes No results for input(s): TROPONINI in the last 168 hours. ------------------------------------------------------------------------------------------------------------------  RADIOLOGY:  Dg Chest Portable 1 View  Result Date: 09/29/2018 CLINICAL DATA:  Increasing shortness of breath. COPD. EXAM: PORTABLE CHEST 1 VIEW COMPARISON:  04/14/2018 FINDINGS: The heart size and pulmonary vascularity are normal. No infiltrates or effusions. The lungs are hyperinflated consistent with the patient's history of COPD. Chronic peribronchial thickening. Enlargement of the main pulmonary artery suggesting pulmonary arterial hypertension. IMPRESSION: No acute cardiopulmonary disease. COPD. Chronic bronchitic changes. Enlargement of the main pulmonary artery suggesting pulmonary arterial hypertension. Electronically Signed   By: Lorriane Shire M.D.   On: 09/29/2018 14:31     IMPRESSION AND PLAN:   68 y.o. male with a known history of end-stage COPD, prostate cancer, history of squamous cell skin cancer who presents to the hospital complaining of worsening shortness of breath.  1.  Acute on chronic respiratory failure with hypoxia-secondary to COPD exacerbation. - Continue O2 supplementation, will treat underlying COPD with IV steroids, scheduled duo nebs,  Pulmicort nebs.  2.  COPD exacerbation-secondary to ongoing tobacco abuse.  Patient's chest x-ray does not show any evidence of acute pneumonia.  He does have a productive sputum though. -We will treat the patient with IV steroids, scheduled duo nebs, Pulmicort nebs.  We will also give empiric doxycycline for 5 days.  3.  History of prostate cancer-patient has finished treatment with Lupron.  Continue follow-up with urology and oncology as an  outpatient.  4.  Anxiety-continue Klonopin as needed.    All the records are reviewed and case discussed with ED provider. Management plans discussed with the patient, family and they are in agreement.  CODE STATUS: DNR  TOTAL TIME TAKING CARE OF THIS PATIENT: 40 minutes.    Henreitta Leber M.D on 09/29/2018 at 4:12 PM  Between 7am to 6pm - Pager - [phone]  After 6pm go to [email or web address] - password [email or web address] [facility]  Office  [phone]  CC: Primary care physician; [name], NP

## 2018-09-29 NOTE — Patient Instructions (Signed)
Chronic Obstructive Pulmonary Disease °Chronic obstructive pulmonary disease (COPD) is a long-term (chronic) lung problem. When you have COPD, it is hard for air to get in and out of your lungs. Usually the condition gets worse over time, and your lungs will never return to normal. There are things you can do to keep yourself as healthy as possible. °· Your doctor may treat your condition with: °? Medicines. °? Oxygen. °? Lung surgery. °· Your doctor may also recommend: °? Rehabilitation. This includes steps to make your body work better. It may involve a team of specialists. °? Quitting smoking, if you smoke. °? Exercise and changes to your diet. °? Comfort measures (palliative care). °Follow these instructions at home: °Medicines °· Take over-the-counter and prescription medicines only as told by your doctor. °· Talk to your doctor before taking any cough or allergy medicines. You may need to avoid medicines that cause your lungs to be dry. °Lifestyle °· If you smoke, stop. Smoking makes the problem worse. If you need help quitting, ask your doctor. °· Avoid being around things that make your breathing worse. This may include smoke, chemicals, and fumes. °· Stay active, but remember to rest as well. °· Learn and use tips on how to relax. °· Make sure you get enough sleep. Most adults need at least 7 hours of sleep every night. °· Eat healthy foods. Eat smaller meals more often. Rest before meals. °Controlled breathing °Learn and use tips on how to control your breathing as told by your doctor. Try: °· Breathing in (inhaling) through your nose for 1 second. Then, pucker your lips and breath out (exhale) through your lips for 2 seconds. °· Putting one hand on your belly (abdomen). Breathe in slowly through your nose for 1 second. Your hand on your belly should move out. Pucker your lips and breathe out slowly through your lips. Your hand on your belly should move in as you breathe out. ° °Controlled coughing °Learn  and use controlled coughing to clear mucus from your lungs. Follow these steps: °1. Lean your head a little forward. °2. Breathe in deeply. °3. Try to hold your breath for 3 seconds. °4. Keep your mouth slightly open while coughing 2 times. °5. Spit any mucus out into a tissue. °6. Rest and do the steps again 1 or 2 times as needed. °General instructions °· Make sure you get all the shots (vaccines) that your doctor recommends. Ask your doctor about a flu shot and a pneumonia shot. °· Use oxygen therapy and pulmonary rehabilitation if told by your doctor. If you need home oxygen therapy, ask your doctor if you should buy a tool to measure your oxygen level (oximeter). °· Make a COPD action plan with your doctor. This helps you to know what to do if you feel worse than usual. °· Manage any other conditions you have as told by your doctor. °· Avoid going outside when it is very hot, cold, or humid. °· Avoid people who have a sickness you can catch (contagious). °· Keep all follow-up visits as told by your doctor. This is important. °Contact a doctor if: °· You cough up more mucus than usual. °· There is a change in the color or thickness of the mucus. °· It is harder to breathe than usual. °· Your breathing is faster than usual. °· You have trouble sleeping. °· You need to use your medicines more often than usual. °· You have trouble doing your normal activities such as getting dressed   or walking around the house. °Get help right away if: °· You have shortness of breath while resting. °· You have shortness of breath that stops you from: °? Being able to talk. °? Doing normal activities. °· Your chest hurts for longer than 5 minutes. °· Your skin color is more blue than usual. °· Your pulse oximeter shows that you have low oxygen for longer than 5 minutes. °· You have a fever. °· You feel too tired to breathe normally. °Summary °· Chronic obstructive pulmonary disease (COPD) is a long-term lung problem. °· The way your  lungs work will never return to normal. Usually the condition gets worse over time. There are things you can do to keep yourself as healthy as possible. °· Take over-the-counter and prescription medicines only as told by your doctor. °· If you smoke, stop. Smoking makes the problem worse. °This information is not intended to replace advice given to you by your health care provider. Make sure you discuss any questions you have with your health care provider. °Document Released: 06/23/2007 Document Revised: 12/17/2016 Document Reviewed: 02/09/2016 °Elsevier Patient Education © 2020 [employer] ° °

## 2018-09-29 NOTE — ED Provider Notes (Signed)
[facility] Emergency Department Provider Note  ____________________________________________   First MD Initiated Contact with Patient 09/29/18 1232     (approximate)  I have reviewed the triage vital signs and the nursing notes.   HISTORY  Chief Complaint Shortness of Breath    HPI Kevin Nelson is a 68 y.o. male  With PMHx below here with cough, SOB. Pt   states that over the last several days, he has become progressively more short of breath.  Describes approximately 2 to 3 weeks of shortness of breath.  He states that he is been producing large amounts of thicker than usual, white sputum.  He has had mild wheezing that has worsened over the last week.  Is been using his medications as prescribed.  He is adamant he is not had any fevers.  No known coronavirus exposures.  No other acute medical complaints.  No leg swelling.  He states that symptoms feel similar to his previous episodes of COPD.  He is normally on 2 L at night, and 2-1/2 L during the day as needed when exerting himself.  He has been having to use his oxygen more at home.  He was found to be in the low 80s today with EMS on his oxygen.  He feels better now that he is up to 4 L.       Past Medical History:  Diagnosis Date  . Basal cell carcinoma   . End stage COPD ([place])   . History of nonmelanoma skin cancer   . Prostate cancer ([place])   . Squamous cell skin cancer     Patient Active Problem List   Diagnosis Date Noted  . COPD exacerbation ([place]) 09/29/2018  . Sensorineural hearing loss (SNHL) 08/21/2018  . Coronary atherosclerosis 07/31/2018  . Chronic hepatitis ([place]) 04/21/2018  . Lung nodule < 6cm on CT 04/19/2018  . Senile purpura ([place]) 04/19/2018  . Protein-calorie malnutrition ([place]) 04/19/2018  . Nicotine dependence, cigarettes, uncomplicated 123456  . Controlled substance agreement signed 01/27/2018  . COPD, very severe ([place]) 01/13/2018  . Prostate cancer ([place]) 01/13/2018  .  History of basal cell carcinoma (BCC) 01/13/2018  . Generalized anxiety disorder 01/13/2018    Past Surgical History:  Procedure Laterality Date  . CHOLECYSTECTOMY    . FACIAL LACERATION REPAIR    . SKIN CANCER EXCISION      Prior to Admission medications   Medication Sig Start Date End Date Taking? Authorizing Provider  albuterol (PROVENTIL) (2.5 MG/3ML) 0.083% nebulizer solution USE 1 VIAL(3 MLS) BY NEBULIZATION EVERY 6 HOURS AS NEEDED FOR WHEEZING OR SHORTNESS OF BREATH Patient taking differently: Take 2.5 mg by nebulization 2 (two) times daily.  06/05/18  Yes Cannady, [name] T, NP  clonazePAM (KLONOPIN) 0.5 MG tablet TAKE 1 TABLET BY MOUTH TWICE DAILY AS NEEDED FOR ANXIETY Patient taking differently: Take 0.5 mg by mouth 2 (two) times daily.  08/23/18  Yes Cannady, [name] T, NP  Ipratropium-Albuterol (COMBIVENT RESPIMAT) 20-100 MCG/ACT AERS respimat Inhale 1 puff into the lungs every 6 (six) hours as needed. 05/31/18  Yes Cannady, [name] T, NP  Tiotropium Bromide-Olodaterol (STIOLTO RESPIMAT) 2.5-2.5 MCG/ACT AERS Inhale 2 puffs into the lungs daily. 05/31/18  Yes [name], NP    Allergies Patient has no known allergies.  Family History  Problem Relation Age of Onset  . Cancer Sister   . Cancer - Cervical Mother   . Heart attack Brother   . Heart attack Maternal Grandfather  Social History Social History   Tobacco Use  . Smoking status: Current Every Day Smoker    Packs/day: 0.50    Years: 51.00    Pack years: 25.50    Types: Cigarettes    Last attempt to quit: 04/14/2018    Years since quitting: 0.4  . Smokeless tobacco: Never Used  Substance Use Topics  . Alcohol use: Yes    Alcohol/week: 3.0 standard drinks    Types: 3 Cans of beer per week    Comment: last drink 04/13/18  . Drug use: Never    Review of Systems  Review of Systems  Constitutional: Positive for fatigue. Negative for chills and fever.  HENT: Negative for sore throat.   Respiratory:  Positive for cough, shortness of breath and wheezing.   Cardiovascular: Negative for chest pain.  Gastrointestinal: Negative for abdominal pain.  Genitourinary: Negative for flank pain.  Musculoskeletal: Negative for neck pain.  Skin: Negative for rash and wound.  Allergic/Immunologic: Negative for immunocompromised state.  Neurological: Negative for weakness and numbness.  Hematological: Does not bruise/bleed easily.  All other systems reviewed and are negative.    ____________________________________________  PHYSICAL EXAM:      VITAL SIGNS: ED Triage Vitals  Enc Vitals Group     BP 09/29/18 1237 119/79     Pulse Rate 09/29/18 1237 94     Resp 09/29/18 1237 18     Temp 09/29/18 1237 98.5 F (36.9 C)     Temp Source 09/29/18 1237 Oral     SpO2 09/29/18 1237 100 %     Weight 09/29/18 1238 112 lb (50.8 kg)     Height 09/29/18 1238 5\' 7"  (1.702 m)     Head Circumference --      Peak Flow --      Pain Score 09/29/18 1238 0     Pain Loc --      Pain Edu? --      Excl. in [place]? --      Physical Exam Vitals signs and nursing note reviewed.  Constitutional:      General: He is not in acute distress.    Appearance: He is well-developed.     Comments: Distressed  HENT:     Head: Normocephalic and atraumatic.  Eyes:     Conjunctiva/sclera: Conjunctivae normal.  Neck:     Musculoskeletal: Neck supple.  Cardiovascular:     Rate and Rhythm: Normal rate and regular rhythm.     Heart sounds: Normal heart sounds. No murmur. No friction rub.  Pulmonary:     Effort: Tachypnea and respiratory distress present.     Breath sounds: Examination of the right-upper field reveals wheezing. Examination of the left-upper field reveals wheezing. Examination of the right-middle field reveals wheezing. Examination of the left-middle field reveals wheezing. Examination of the right-lower field reveals wheezing. Examination of the left-lower field reveals wheezing. Decreased breath sounds and  wheezing present. No rales.  Abdominal:     General: There is no distension.     Palpations: Abdomen is soft.     Tenderness: There is no abdominal tenderness.  Skin:    General: Skin is warm.     Capillary Refill: Capillary refill takes less than 2 seconds.  Neurological:     Mental Status: He is alert and oriented to person, place, and time.     Motor: No abnormal muscle tone.       ____________________________________________   LABS (all labs ordered are listed, but only abnormal  results are displayed)  Labs Reviewed  CBC WITH DIFFERENTIAL/PLATELET - Abnormal; Notable for the following components:      Result Value   WBC 14.1 (*)    Hemoglobin 12.2 (*)    HCT 38.4 (*)    Neutro Abs 11.0 (*)    Monocytes Absolute 1.4 (*)    All other components within normal limits  COMPREHENSIVE METABOLIC PANEL - Abnormal; Notable for the following components:   Chloride 93 (*)    CO2 35 (*)    Glucose, Bld 110 (*)    Creatinine, Ser 0.39 (*)    Calcium 8.8 (*)    All other components within normal limits  SARS CORONAVIRUS 2 (HOSPITAL ORDER, [facility])  BRAIN NATRIURETIC PEPTIDE    ____________________________________________  EKG: Normal sinus rhythm, VR 96. QRS 88, QTc 446. RSR' in V1 or V2. RAD. ________________________________________  RADIOLOGY All imaging, including plain films, CT scans, and ultrasounds, independently reviewed by me, and interpretations confirmed via formal radiology reads.  ED MD interpretation:   CXR: No acute abnormality  Official radiology report(s): Dg Chest Portable 1 View  Result Date: 09/29/2018 CLINICAL DATA:  Increasing shortness of breath. COPD. EXAM: PORTABLE CHEST 1 VIEW COMPARISON:  04/14/2018 FINDINGS: The heart size and pulmonary vascularity are normal. No infiltrates or effusions. The lungs are hyperinflated consistent with the patient's history of COPD. Chronic peribronchial thickening. Enlargement of the  main pulmonary artery suggesting pulmonary arterial hypertension. IMPRESSION: No acute cardiopulmonary disease. COPD. Chronic bronchitic changes. Enlargement of the main pulmonary artery suggesting pulmonary arterial hypertension. Electronically Signed   By: Lorriane Shire M.D.   On: 09/29/2018 14:31    ____________________________________________  PROCEDURES   Procedure(s) performed (including Critical Care):  .Critical Care Performed by: Duffy Bruce, MD Authorized by: Henreitta Leber, MD   Critical care provider statement:    Critical care time (minutes):  35   Critical care time was exclusive of:  Separately billable procedures and treating other patients and teaching time   Critical care was necessary to treat or prevent imminent or life-threatening deterioration of the following conditions:  Cardiac failure, circulatory failure and respiratory failure   Critical care was time spent personally by me on the following activities:  Development of treatment plan with patient or surrogate, discussions with consultants, evaluation of patient's response to treatment, examination of patient, obtaining history from patient or surrogate, ordering and performing treatments and interventions, ordering and review of laboratory studies, ordering and review of radiographic studies, pulse oximetry, re-evaluation of patient's condition and review of old charts   I assumed direction of critical care for this patient from another provider in my specialty: no      ____________________________________________  INITIAL IMPRESSION / MDM / [place] / ED COURSE  As part of my medical decision making, I reviewed the following data within the electronic MEDICAL RECORD NUMBER Notes from prior ED visits and  Controlled Substance Database      *[place] Drey was evaluated in Emergency Department on 09/29/2018 for the symptoms described in the history of present illness. He was evaluated in the context  of the global COVID-19 pandemic, which necessitated consideration that the patient might be at risk for infection with the SARS-CoV-2 virus that causes COVID-19. Institutional protocols and algorithms that pertain to the evaluation of patients at risk for COVID-19 are in a state of rapid change based on information released by regulatory bodies including the CDC and federal and state organizations. These  policies and algorithms were followed during the patient's care in the ED.  Some ED evaluations and interventions may be delayed as a result of limited staffing during the pandemic.*      Medical Decision Making: 68 yo M here with acute on chronic hypoxic resp failure. Labs show leukocytosis but are otherwise unremarkable. CXR is without focal abnormality. Given sputum production on history, will start on doxy. Suspect COPD exacerbation. COVID neg. IV Steroids, CAT with ipratroprium given with significant improvement. Mentating well. Admit.  ____________________________________________  FINAL CLINICAL IMPRESSION(S) / ED DIAGNOSES  Final diagnoses:  COPD exacerbation ([place])  Acute on chronic respiratory failure with hypoxia ([place])     MEDICATIONS GIVEN DURING THIS VISIT:  Medications  clonazePAM (KLONOPIN) tablet 0.5 mg (has no administration in time range)  acetaminophen (TYLENOL) tablet 650 mg (has no administration in time range)    Or  acetaminophen (TYLENOL) suppository 650 mg (has no administration in time range)  ondansetron (ZOFRAN) tablet 4 mg (has no administration in time range)    Or  ondansetron (ZOFRAN) injection 4 mg (has no administration in time range)  enoxaparin (LOVENOX) injection 40 mg (has no administration in time range)  methylPREDNISolone sodium succinate (SOLU-MEDROL) 40 mg/mL injection 40 mg (has no administration in time range)  ipratropium-albuterol (DUONEB) 0.5-2.5 (3) MG/3ML nebulizer solution 3 mL (has no administration in time range)  budesonide (PULMICORT)  nebulizer solution 0.5 mg (has no administration in time range)  doxycycline (VIBRA-TABS) tablet 100 mg (has no administration in time range)  methylPREDNISolone sodium succinate (SOLU-MEDROL) 125 mg/2 mL injection 125 mg (125 mg Intravenous Given 09/29/18 1329)  albuterol (PROVENTIL) (2.5 MG/3ML) 0.083% nebulizer solution (10 mg/hr Nebulization Given 09/29/18 1330)  ipratropium (ATROVENT) nebulizer solution 1 mg (1 mg Nebulization Given 09/29/18 1430)  sodium chloride 0.9 % bolus 1,000 mL (1,000 mLs Intravenous New Bag/Given 09/29/18 1555)  doxycycline (VIBRAMYCIN) 100 mg in sodium chloride 0.9 % 250 mL IVPB (100 mg Intravenous New Bag/Given 09/29/18 1555)     ED Discharge Orders    None       Note:  This document was prepared using Dragon voice recognition software and may include unintentional dictation errors.   Duffy Bruce, MD 09/29/18 [phone]

## 2018-09-29 NOTE — ED Triage Notes (Signed)
Pt reports increased SOB over the past week, 99% on 4L when arriving via EMS. Hx COPD. Per fire dept pt was in the 80s O2 when they arrived. PT ambulated to toilet without assitance, gait steady. A&Ox4, NAD

## 2018-09-29 NOTE — ED Notes (Signed)
Report given to Ashley, RN

## 2018-09-29 NOTE — TOC Initial Note (Addendum)
Transition of Care [facility]) - Initial/Assessment Note    Patient Details  Name: Kevin Nelson MRN: [id number] Date of Birth: Apr 24, 1950  [facility] of [facility] CM/SW Contact:    Katrina Stack, RN Phone Number: 09/29/2018, 3:23 PM  Clinical Narrative:                High risk score. Patient is followed by [facility] and [facility].  Has chronic oxygen at home through [place].  Uses it regularly at night and during the day "I do not always wear it. but I have it." He has pulse tanks to use when he travels. No issues obtaining medications or with transportation.  Updated Janci Minor with [facility] of admission        Patient Goals and CMS Choice        Expected Discharge Plan and Services                                                Prior Living Arrangements/Services                       Activities of Daily Living      Permission Sought/Granted                  Emotional Assessment              Admission diagnosis:  sob Patient Active Problem List   Diagnosis Date Noted  . Sensorineural hearing loss (SNHL) 08/21/2018  . Coronary atherosclerosis 07/31/2018  . Chronic hepatitis ([place]) 04/21/2018  . Lung nodule < 6cm on CT 04/19/2018  . Senile purpura ([place]) 04/19/2018  . Protein-calorie malnutrition ([place]) 04/19/2018  . Nicotine dependence, cigarettes, uncomplicated 123456  . Controlled substance agreement signed 01/27/2018  . COPD, very severe ([place]) 01/13/2018  . Prostate cancer ([place]) 01/13/2018  . History of basal cell carcinoma (BCC) 01/13/2018  . Generalized anxiety disorder 01/13/2018   PCP:  [name], NP Pharmacy:   [facility] [place], [place] - [place] [facility] [address] [place] [address] Phone: [phone] Fax: [phone]     Social Determinants of Health (SDOH) Interventions    Readmission Risk Interventions No flowsheet data found.

## 2018-09-29 NOTE — Progress Notes (Signed)
   [place] at [facility] Day: 0 days Kevin Nelson is a 68 y.o. male with end-stage COPD, prostate cancer, history of squamous cell skin cancer who presents to the hospital complaining of worsening shortness of breath.   Given his end-stage COPD discussed CODE STATUS with the patient at bedside.  Patient does not want any heroic measures to save his life.  He does not want any cardiopulmonary resuscitation, therefore wants to be a DO NOT RESUSCITATE.  Advance care planning discussed with patient  without additional Family at bedside. All questions in regards to overall condition and expected prognosis answered. The decision was made to continue current code status  CODE STATUS: dnr Time spent: 16 minutes

## 2018-09-29 NOTE — ED Notes (Signed)
Pt O2 100% on 4L [place], decreased O2 to 2L but O2 saturation dropped to 88%, pt reports he is on 2 1/2 liters at home which he wears at night and sometimes during the day

## 2018-09-29 NOTE — ED Notes (Signed)
ED TO INPATIENT HANDOFF REPORT  ED Nurse Name and Phone #: Janett Billow [phone]   S Name/Age/Gender Kevin Nelson 68 y.o. male Room/Bed: ED01A/ED01A  Code Status   Code Status: Prior  Home/SNF/Other Home Patient oriented to: self, place, time and situation Is this baseline? Yes   Triage Complete: Triage complete  Chief Complaint sob  Triage Note Pt reports increased SOB over the past week, 99% on 4L when arriving via EMS. Hx COPD. Per fire dept pt was in the 80s O2 when they arrived. PT ambulated to toilet without assitance, gait steady. A&Ox4, NAD   Allergies No Known Allergies  Level of Care/Admitting Diagnosis ED Disposition    ED Disposition Condition Comment   Admit  Hospital Area: [place] [[id number]]  Level of Care: Med-Surg [16]  Covid Evaluation: Confirmed COVID Negative  Diagnosis: COPD exacerbation [facility]) [id number]  Admitting Physician: Henreitta Leber [id number]  Attending Physician: Henreitta Leber [id number]  PT Class (Do Not Modify): Observation [104]  PT Acc Code (Do Not Modify): Observation [10022]       B Medical/Surgery History Past Medical History:  Diagnosis Date  . Basal cell carcinoma   . End stage COPD ([place])   . History of nonmelanoma skin cancer   . Prostate cancer ([place])   . Squamous cell skin cancer    Past Surgical History:  Procedure Laterality Date  . CHOLECYSTECTOMY    . FACIAL LACERATION REPAIR    . SKIN CANCER EXCISION       A IV Location/Drains/Wounds Patient Lines/Drains/Airways Status   Active Line/Drains/Airways    Name:   Placement date:   Placement time:   Site:   Days:   Peripheral IV 09/29/18 Right Arm   09/29/18    1354    Arm   less than 1          Intake/Output Last 24 hours No intake or output data in the 24 hours ending 09/29/18 1645  Labs/Imaging Results for orders placed or performed during the hospital encounter of 09/29/18 (from the past 48 hour(s))  CBC with Differential     Status:  Abnormal   Collection Time: 09/29/18 12:43 PM  Result Value Ref Range   WBC 14.1 (H) 4.0 - 10.5 K/uL   RBC 4.23 4.22 - 5.81 MIL/uL   Hemoglobin 12.2 (L) 13.0 - 17.0 g/dL   HCT 38.4 (L) 39.0 - 52.0 %   MCV 90.8 80.0 - 100.0 fL   MCH 28.8 26.0 - 34.0 pg   MCHC 31.8 30.0 - 36.0 g/dL   RDW 12.9 11.5 - 15.5 %   Platelets 384 150 - 400 K/uL   nRBC 0.0 0.0 - 0.2 %   Neutrophils Relative % 77 %   Neutro Abs 11.0 (H) 1.7 - 7.7 K/uL   Lymphocytes Relative 10 %   Lymphs Abs 1.4 0.7 - 4.0 K/uL   Monocytes Relative 10 %   Monocytes Absolute 1.4 (H) 0.1 - 1.0 K/uL   Eosinophils Relative 2 %   Eosinophils Absolute 0.3 0.0 - 0.5 K/uL   Basophils Relative 1 %   Basophils Absolute 0.1 0.0 - 0.1 K/uL   Immature Granulocytes 0 %   Abs Immature Granulocytes 0.04 0.00 - 0.07 K/uL    Comment: Performed at [facility], [address], [place], [place] [address]  Comprehensive metabolic panel     Status: Abnormal   Collection Time: 09/29/18 12:43 PM  Result Value Ref Range   Sodium 137 135 -  145 mmol/L   Potassium 3.8 3.5 - 5.1 mmol/L   Chloride 93 (L) 98 - 111 mmol/L   CO2 35 (H) 22 - 32 mmol/L   Glucose, Bld 110 (H) 70 - 99 mg/dL   BUN 8 8 - 23 mg/dL   Creatinine, Ser 0.39 (L) 0.61 - 1.24 mg/dL   Calcium 8.8 (L) 8.9 - 10.3 mg/dL   Total Protein 6.6 6.5 - 8.1 g/dL   Albumin 3.7 3.5 - 5.0 g/dL   AST 16 15 - 41 U/L   ALT 12 0 - 44 U/L   Alkaline Phosphatase 61 38 - 126 U/L   Total Bilirubin 0.4 0.3 - 1.2 mg/dL   GFR calc non Af Amer >60 >60 mL/min   GFR calc Af Amer >60 >60 mL/min   Anion gap 9 5 - 15    Comment: Performed at [facility], [address] [place], [place] [address]  Brain natriuretic peptide     Status: None   Collection Time: 09/29/18 12:43 PM  Result Value Ref Range   B Natriuretic Peptide 43.0 0.0 - 100.0 pg/mL    Comment: Performed at [facility], [address] [place], [place] [address]  SARS Coronavirus 2 [facility] order, Performed in  [facility] hospital lab) Nasopharyngeal Nasopharyngeal Swab     Status: None   Collection Time: 09/29/18 12:43 PM   Specimen: Nasopharyngeal Swab  Result Value Ref Range   SARS Coronavirus 2 NEGATIVE NEGATIVE    Comment: (NOTE) If result is NEGATIVE SARS-CoV-2 target nucleic acids are NOT DETECTED. The SARS-CoV-2 RNA is generally detectable in upper and lower  respiratory specimens during the acute phase of infection. The lowest  concentration of SARS-CoV-2 viral copies this assay can detect is 250  copies / mL. A negative result does not preclude SARS-CoV-2 infection  and should not be used as the sole basis for treatment or other  patient management decisions.  A negative result may occur with  improper specimen collection / handling, submission of specimen other  than nasopharyngeal swab, presence of viral mutation(s) within the  areas targeted by this assay, and inadequate number of viral copies  (<250 copies / mL). A negative result must be combined with clinical  observations, patient history, and epidemiological information. If result is POSITIVE SARS-CoV-2 target nucleic acids are DETECTED. The SARS-CoV-2 RNA is generally detectable in upper and lower  respiratory specimens dur ing the acute phase of infection.  Positive  results are indicative of active infection with SARS-CoV-2.  Clinical  correlation with patient history and other diagnostic information is  necessary to determine patient infection status.  Positive results do  not rule out bacterial infection or co-infection with other viruses. If result is PRESUMPTIVE POSTIVE SARS-CoV-2 nucleic acids MAY BE PRESENT.   A presumptive positive result was obtained on the submitted specimen  and confirmed on repeat testing.  While 2019 novel coronavirus  (SARS-CoV-2) nucleic acids may be present in the submitted sample  additional confirmatory testing may be necessary for epidemiological  and / or clinical management  purposes  to differentiate between  SARS-CoV-2 and other Sarbecovirus currently known to infect humans.  If clinically indicated additional testing with an alternate test  methodology [phone]) is advised. The SARS-CoV-2 RNA is generally  detectable in upper and lower respiratory sp ecimens during the acute  phase of infection. The expected result is Negative. Fact Sheet for Patients:  StrictlyIdeas.no Fact Sheet for Healthcare Providers: BankingDealers.co.za This test is not yet  approved or cleared by the [place] and has been authorized for detection and/or diagnosis of SARS-CoV-2 by FDA under an Emergency Use Authorization (EUA).  This EUA will remain in effect (meaning this test can be used) for the duration of the COVID-19 declaration under Section 564(b)(1) of the Act, 21 U.S.C. section 360bbb-3(b)(1), unless the authorization is terminated or revoked sooner. Performed at [facility], [address], [place],  [address]    Dg Chest Portable 1 View  Result Date: 09/29/2018 CLINICAL DATA:  Increasing shortness of breath. COPD. EXAM: PORTABLE CHEST 1 VIEW COMPARISON:  04/14/2018 FINDINGS: The heart size and pulmonary vascularity are normal. No infiltrates or effusions. The lungs are hyperinflated consistent with the patient's history of COPD. Chronic peribronchial thickening. Enlargement of the main pulmonary artery suggesting pulmonary arterial hypertension. IMPRESSION: No acute cardiopulmonary disease. COPD. Chronic bronchitic changes. Enlargement of the main pulmonary artery suggesting pulmonary arterial hypertension. Electronically Signed   By: Lorriane Shire M.D.   On: 09/29/2018 14:31    Pending Labs [employer] (From admission, onward)    Start     Ordered   Signed and Held  CBC  (enoxaparin (LOVENOX)    CrCl >/= 30 ml/min)  Once,   R    Comments: Baseline for enoxaparin therapy IF NOT ALREADY DRAWN.   Notify MD if PLT < 100 K.    Signed and Held   Signed and Held  Creatinine, serum  (enoxaparin (LOVENOX)    CrCl >/= 30 ml/min)  Once,   R    Comments: Baseline for enoxaparin therapy IF NOT ALREADY DRAWN.    Signed and Held   Signed and Held  Creatinine, serum  (enoxaparin (LOVENOX)    CrCl >/= 30 ml/min)  Weekly,   R    Comments: while on enoxaparin therapy    Signed and Held          Vitals/Pain Today's Vitals   09/29/18 1238 09/29/18 1330 09/29/18 1400 09/29/18 1530  BP:  113/62 (!) 113/56 129/66  Pulse:  92 88 90  Resp:  (!) 22 (!) 21 (!) 27  Temp:      TempSrc:      SpO2:  99% 100% 100%  Weight: 50.8 kg     Height: 5\' 7"  (1.702 m)     PainSc: 0-No pain       Isolation Precautions No active isolations  Medications Medications  doxycycline (VIBRAMYCIN) 100 mg in sodium chloride 0.9 % 250 mL IVPB (100 mg Intravenous New Bag/Given 09/29/18 1555)  methylPREDNISolone sodium succinate (SOLU-MEDROL) 125 mg/2 mL injection 125 mg (125 mg Intravenous Given 09/29/18 1329)  albuterol (PROVENTIL) (2.5 MG/3ML) 0.083% nebulizer solution (10 mg/hr Nebulization Given 09/29/18 1330)  ipratropium (ATROVENT) nebulizer solution 1 mg (1 mg Nebulization Given 09/29/18 1430)  sodium chloride 0.9 % bolus 1,000 mL (1,000 mLs Intravenous New Bag/Given 09/29/18 1555)    Mobility walks Low fall risk   Focused Assessments 1   R Recommendations: See Admitting Provider Note  Report given to:   Additional Notes:

## 2018-09-29 NOTE — Progress Notes (Signed)
BP (!) 89/53   Pulse 98   Temp 97.9 F (36.6 C) (Oral)   SpO2 (!) 85%    Subjective:    Patient ID: Kevin Nelson, male    DOB: 05/17/1950, 68 y.o.   MRN: [id number]  HPI: Kevin Nelson is a 68 y.o. male  Chief Complaint  Patient presents with  . COPD    . This visit was completed via Doximity due to the restrictions of the COVID-19 pandemic. All issues as above were discussed and addressed. Physical exam was done as above through visual confirmation on Doximity. If it was felt that the patient should be evaluated in the office, they were directed there. The patient verbally consented to this visit. . Location of the patient: home . Location of the provider: work . Those involved with this call:  . Provider: Marnee Guarneri, DNP . CMA: Yvonna Alanis, CMA . Front Desk/Registration: Jill Side  . Time spent on call: 15 minutes with patient face to face via video conference. More than 50% of this time was spent in counseling and coordination of care. 10 minutes total spent in review of patient's record and preparation of their chart.  . I verified patient identity using two factors (patient name and date of birth). Patient consents verbally to being seen via telemedicine visit today.    COPD Followed by Dr. Alva Garnet and last seen 08/14/18.  Has been feeling bad for two weeks, over past few days has been worse and is worried he may have pneumonia. Currently, using "a lot" of oxygen.  Baseline is O2 dependent.  At this time it is set at 2.5L, has been using higher than this on occasion.  Reports productive cough, thick like "glu".  Has been using nebulizer more often with no benefit.  His friend told him to be seen by provider sooner, but he thought he would get better.  Continues to smoke, about 1 PPD, not interested in quitting.  Denies fever, congestion, loss of taste/smell, otalgia, rhinorrhea. COPD status: exacerbated Satisfied with current treatment?: yes Oxygen use: yes Dyspnea  frequency: yes, at baseline, but currently worse than baseline Cough frequency: frequent at this time Rescue inhaler frequency:  Using nebulizer more Limitation of activity: no Productive cough: present x 2 weeks with worsening Last Spirometry: 02/14/18 --- FVC 56% pred and FEV1 25% pred, FEV1/FVC 35% Pneumovax: refused Influenza: refused at this time  Relevant past medical, surgical, family and social history reviewed and updated as indicated. Interim medical history since our last visit reviewed. Allergies and medications reviewed and updated.  Review of Systems  Constitutional: Negative for activity change, chills, diaphoresis, fatigue and fever.  HENT: Negative for congestion, ear pain, postnasal drip, rhinorrhea, sinus pressure, sinus pain, sore throat and voice change.   Respiratory: Positive for cough, chest tightness, shortness of breath and wheezing.   Cardiovascular: Negative for chest pain, palpitations and leg swelling.  Gastrointestinal: Negative for abdominal distention, abdominal pain, constipation, diarrhea, nausea and vomiting.  Musculoskeletal: Negative for myalgias.  Neurological: Negative.   Psychiatric/Behavioral: Negative.     Per HPI unless specifically indicated above     Objective:    BP (!) 89/53   Pulse 98   Temp 97.9 F (36.6 C) (Oral)   SpO2 (!) 85%   Wt Readings from Last 3 Encounters:  07/31/18 106 lb (48.1 kg)  06/19/18 115 lb (52.2 kg)  04/19/18 109 lb (49.4 kg)    Physical Exam Vitals signs and nursing note reviewed.  Constitutional:  General: He is awake. He is not in acute distress.    Appearance: He is well-developed. He is ill-appearing.  HENT:     Head: Normocephalic.     Right Ear: Hearing normal. No drainage.     Left Ear: Hearing normal. No drainage.  Eyes:     General: Lids are normal.        Right eye: No discharge.        Left eye: No discharge.     Conjunctiva/sclera: Conjunctivae normal.  Neck:     Musculoskeletal:  Normal range of motion.  Pulmonary:     Effort: Pulmonary effort is normal. No respiratory distress.     Comments: Unable to auscultate due to virtual exam only.  He has intermittent productive cough noted and hoarseness.  Talkative, although some mild SOB present.  O2 in place via [place].   Neurological:     Mental Status: He is alert and oriented to person, place, and time.  Psychiatric:        Mood and Affect: Mood normal.        Behavior: Behavior normal. Behavior is cooperative.        Thought Content: Thought content normal.        Judgment: Judgment normal.     Results for orders placed or performed during the hospital encounter of 05/09/18  HCV RNA quant  Result Value Ref Range   HCV Quantitative See Final Results >50 IU/mL   Test Information Comment   Hepatic function panel  Result Value Ref Range   Total Protein 7.0 6.5 - 8.1 g/dL   Albumin 3.9 3.5 - 5.0 g/dL   AST 20 15 - 41 U/L   ALT 16 0 - 44 U/L   Alkaline Phosphatase 57 38 - 126 U/L   Total Bilirubin 0.8 0.3 - 1.2 mg/dL   Bilirubin, Direct <0.1 0.0 - 0.2 mg/dL   Indirect Bilirubin NOT CALCULATED 0.3 - 0.9 mg/dL  ANA  Result Value Ref Range   Anti Nuclear Antibody (ANA) Positive (A) Negative  Hepatitis C genotype  Result Value Ref Range   HCV Genotype 1a    Please Note (HCV): Comment   Hepatitis A antibody, total  Result Value Ref Range   hep A Total Ab Negative Negative  Alpha-1-antitrypsin  Result Value Ref Range   A-1 Antitrypsin, Ser 138 101 - 187 mg/dL  Anti-smooth muscle antibody, IgG  Result Value Ref Range   F-Actin IgG 10 0 - 19 Units  Hepatitis B surface antigen  Result Value Ref Range   Hepatitis B Surface Ag Negative Negative  Mitochondrial antibodies  Result Value Ref Range   Mitochondrial M2 Ab, IgG 45.8 (H) 0.0 - 20.0 Units  Ceruloplasmin  Result Value Ref Range   Ceruloplasmin 23.7 16.0 - 31.0 mg/dL  Miscellaneous [facility] test (send-out)  Result Value Ref Range   [facility] test code  [id number]    [facility] test name HEP B QUALATATIVE    Misc [facility] result COMMENT   HCV RNA (International Units)  Result Value Ref Range   HCV RNA (International Units) 13,700,000 IU/mL   HCV log10 7.137 log10 IU/mL      Assessment & Plan:   Problem List Items Addressed This Visit      Respiratory   COPD, very severe ([place])    Due to hypotension (baseline 100's/70's), along with increased O2 needs with lower sats + productive cough have recommended ER visit today.  High risk for decompensation due to severe COPD.  He agrees with this plan of care and wanted to discuss with provider first.  Plans on going to ER for further assessment.           I discussed the assessment and treatment plan with the patient. The patient was provided an opportunity to ask questions and all were answered. The patient agreed with the plan and demonstrated an understanding of the instructions.   The patient was advised to call back or seek an in-person evaluation if the symptoms worsen or if the condition fails to improve as anticipated.   I provided 15 minutes of time during this encounter.  Follow up plan: Return in about 1 week (around 10/06/2018) for COPD.

## 2018-09-30 DIAGNOSIS — Z9981 Dependence on supplemental oxygen: Secondary | ICD-10-CM | POA: Diagnosis not present

## 2018-09-30 DIAGNOSIS — J449 Chronic obstructive pulmonary disease, unspecified: Secondary | ICD-10-CM | POA: Diagnosis present

## 2018-09-30 DIAGNOSIS — F17210 Nicotine dependence, cigarettes, uncomplicated: Secondary | ICD-10-CM | POA: Diagnosis present

## 2018-09-30 DIAGNOSIS — F419 Anxiety disorder, unspecified: Secondary | ICD-10-CM | POA: Diagnosis present

## 2018-09-30 DIAGNOSIS — J9621 Acute and chronic respiratory failure with hypoxia: Secondary | ICD-10-CM | POA: Diagnosis present

## 2018-09-30 DIAGNOSIS — Z66 Do not resuscitate: Secondary | ICD-10-CM | POA: Diagnosis present

## 2018-09-30 DIAGNOSIS — Z85828 Personal history of other malignant neoplasm of skin: Secondary | ICD-10-CM | POA: Diagnosis not present

## 2018-09-30 DIAGNOSIS — Z8546 Personal history of malignant neoplasm of prostate: Secondary | ICD-10-CM | POA: Diagnosis not present

## 2018-09-30 DIAGNOSIS — J441 Chronic obstructive pulmonary disease with (acute) exacerbation: Secondary | ICD-10-CM | POA: Diagnosis present

## 2018-09-30 DIAGNOSIS — Z20828 Contact with and (suspected) exposure to other viral communicable diseases: Secondary | ICD-10-CM | POA: Diagnosis present

## 2018-09-30 DIAGNOSIS — Z79899 Other long term (current) drug therapy: Secondary | ICD-10-CM | POA: Diagnosis not present

## 2018-09-30 DIAGNOSIS — R0602 Shortness of breath: Secondary | ICD-10-CM | POA: Diagnosis not present

## 2018-09-30 MED ORDER — SODIUM CHLORIDE 0.9% FLUSH
3.0000 mL | INTRAVENOUS | Status: DC | PRN
  Administered 2018-09-30: 3 mL via INTRAVENOUS
  Filled 2018-09-30: qty 3

## 2018-09-30 MED ORDER — SODIUM CHLORIDE 0.9% FLUSH
3.0000 mL | Freq: Two times a day (BID) | INTRAVENOUS | Status: DC
  Administered 2018-09-30 – 2018-10-01 (×3): 3 mL via INTRAVENOUS

## 2018-09-30 MED ORDER — METHYLPREDNISOLONE SODIUM SUCC 40 MG IJ SOLR
40.0000 mg | Freq: Two times a day (BID) | INTRAMUSCULAR | Status: DC
  Administered 2018-10-01: 40 mg via INTRAVENOUS
  Filled 2018-09-30: qty 1

## 2018-09-30 MED ORDER — MAGNESIUM HYDROXIDE 400 MG/5ML PO SUSP
30.0000 mL | Freq: Two times a day (BID) | ORAL | Status: DC | PRN
  Administered 2018-09-30 – 2018-10-01 (×2): 30 mL via ORAL
  Filled 2018-09-30 (×3): qty 30

## 2018-09-30 MED ORDER — ALUM & MAG HYDROXIDE-SIMETH 200-200-20 MG/5ML PO SUSP
30.0000 mL | ORAL | Status: DC | PRN
  Administered 2018-09-30: 12:00:00 30 mL via ORAL
  Filled 2018-09-30: qty 30

## 2018-09-30 NOTE — Progress Notes (Signed)
Reports breathing better; denies productive cough. Tolerating 02 wean to 2l/[place]. Maalox effective for heartburn. MOM given for constipation and awaiting results. Very diminshed breath sounds.

## 2018-09-30 NOTE — Care Management Obs Status (Signed)
[facility] NOTIFICATION   Patient Details  Name: Nylen Montanari MRN: [id number] Date of Birth: 1950/07/19   Medicare Observation Status Notification Given:  Yes    Lynley Killilea A Tarus Briski, RN 09/30/2018, 12:08 PM

## 2018-09-30 NOTE — Progress Notes (Signed)
[facility] [place] NAME: Kevin Nelson    MR#:  [id number]  DATE OF BIRTH:  1950-07-21  SUBJECTIVE:  patient came in with increasing shortness of breath. Currently feeling better. He still worried about his oxygen saturations although they are hundred percent on 3 L. He wears at home during daytime PRN and nighttime 2.5 L. Continues to smoke.  REVIEW OF SYSTEMS:   Review of Systems  Constitutional: Negative for chills, fever and weight loss.  HENT: Negative for ear discharge, ear pain and nosebleeds.   Eyes: Negative for blurred vision, pain and discharge.  Respiratory: Positive for shortness of breath. Negative for sputum production, wheezing and stridor.   Cardiovascular: Negative for chest pain, palpitations, orthopnea and PND.  Gastrointestinal: Negative for abdominal pain, diarrhea, nausea and vomiting.  Genitourinary: Negative for frequency and urgency.  Musculoskeletal: Negative for back pain and joint pain.  Neurological: Negative for sensory change, speech change, focal weakness and weakness.  Psychiatric/Behavioral: Negative for depression and hallucinations. The patient is not nervous/anxious.    Tolerating Diet:yes Tolerating PT: not needed  DRUG ALLERGIES:  No Known Allergies  VITALS:  Blood pressure 119/68, pulse 86, temperature 97.6 F (36.4 C), temperature source Oral, resp. rate 20, height 5\' 7"  (1.702 m), weight 50.8 kg, SpO2 98 %.  PHYSICAL EXAMINATION:   Physical Exam  GENERAL:  68 y.o.-year-old patient lying in the bed with no acute distress. Frail EYES: Pupils equal, round, reactive to light and accommodation. No scleral icterus. Extraocular muscles intact.  HEENT: Head atraumatic, normocephalic. Oropharynx and nasopharynx clear.  NECK:  Supple, no jugular venous distention. No thyroid enlargement, no tenderness.  LUNGS: distant breath sounds bilaterally, no wheezing, rales, rhonchi. No use of accessory  muscles of respiration.  CARDIOVASCULAR: S1, S2 normal. No murmurs, rubs, or gallops.  ABDOMEN: Soft, nontender, nondistended. Bowel sounds present. No organomegaly or mass.  EXTREMITIES: No cyanosis, clubbing or edema b/l.    NEUROLOGIC: Cranial nerves II through XII are intact. No focal Motor or sensory deficits b/l.   PSYCHIATRIC:  patient is alert and oriented x 3.  SKIN: No obvious rash, lesion, or ulcer.   LABORATORY PANEL:  CBC Recent Labs  Lab 09/29/18 1243  WBC 14.1*  HGB 12.2*  HCT 38.4*  PLT 384    Chemistries  Recent Labs  Lab 09/29/18 1243  NA 137  K 3.8  CL 93*  CO2 35*  GLUCOSE 110*  BUN 8  CREATININE 0.39*  CALCIUM 8.8*  AST 16  ALT 12  ALKPHOS 61  BILITOT 0.4   Cardiac Enzymes No results for input(s): TROPONINI in the last 168 hours. RADIOLOGY:  Dg Chest Portable 1 View  Result Date: 09/29/2018 CLINICAL DATA:  Increasing shortness of breath. COPD. EXAM: PORTABLE CHEST 1 VIEW COMPARISON:  04/14/2018 FINDINGS: The heart size and pulmonary vascularity are normal. No infiltrates or effusions. The lungs are hyperinflated consistent with the patient's history of COPD. Chronic peribronchial thickening. Enlargement of the main pulmonary artery suggesting pulmonary arterial hypertension. IMPRESSION: No acute cardiopulmonary disease. COPD. Chronic bronchitic changes. Enlargement of the main pulmonary artery suggesting pulmonary arterial hypertension. Electronically Signed   By: Lorriane Shire M.D.   On: 09/29/2018 14:31   ASSESSMENT AND PLAN:  68 y.o. male with a known history of end-stage COPD, prostate cancer, history of squamous cell skin cancer who presents to the hospital complaining of worsening shortness of breath.  1.  Acute on chronic respiratory failure with hypoxia-secondary  to COPD exacerbation. - Continue O2 supplementation, will treat underlying COPD with IV steroids, scheduled duo nebs, Pulmicort nebs.  2.  COPD exacerbation-secondary to  ongoing tobacco abuse.  Patient's chest x-ray does not show any evidence of acute pneumonia.  He does have a productive sputum though. -We will treat the patient with IV steroids, scheduled duo nebs, Pulmicort nebs.  We will also give empiric doxycycline for 5 days.  3.  History of prostate cancer-patient has finished treatment with Lupron.  Continue follow-up with urology and oncology as an outpatient.  4.  Anxiety-continue Klonopin as needed.  Overall seems to be stable. Will discharged tomorrow if patient continues to improve. Patient is agreeable with plan.  CODE STATUS: DNR  DVT Prophylaxis: Lovenox  TOTAL TIME TAKING CARE OF THIS PATIENT: *30* minutes.  >50% time spent on counselling and coordination of care  POSSIBLE D/C IN *1-2* DAYS, DEPENDING ON CLINICAL CONDITION.  Note: This dictation was prepared with Dragon dictation along with smaller phrase technology. Any transcriptional errors that result from this process are unintentional.  Fritzi Mandes M.D on 09/30/2018 at 2:54 PM  Between 7am to 6pm - Pager - [phone]  After 6pm go to [email or web address] - password [email or web address] [facility]  Office  [phone]  CC: Primary care physician; [name], NPPatient ID: Kevin Nelson, male   DOB: 05/18/1950, 68 y.o.   MRN: [id number]

## 2018-10-01 DIAGNOSIS — J441 Chronic obstructive pulmonary disease with (acute) exacerbation: Secondary | ICD-10-CM | POA: Diagnosis not present

## 2018-10-01 DIAGNOSIS — J9621 Acute and chronic respiratory failure with hypoxia: Secondary | ICD-10-CM | POA: Diagnosis not present

## 2018-10-01 DIAGNOSIS — F419 Anxiety disorder, unspecified: Secondary | ICD-10-CM | POA: Diagnosis not present

## 2018-10-01 DIAGNOSIS — Z8546 Personal history of malignant neoplasm of prostate: Secondary | ICD-10-CM | POA: Diagnosis not present

## 2018-10-01 MED ORDER — DOXYCYCLINE HYCLATE 100 MG PO TABS: 100.0000 mg | ORAL_TABLET | Freq: Two times a day (BID) | ORAL | 0 refills | Status: DC

## 2018-10-01 MED ORDER — PREDNISONE 50 MG PO TABS: 60.0000 mg | ORAL_TABLET | Freq: Every day | ORAL | Status: DC

## 2018-10-01 MED ORDER — PREDNISONE 10 MG PO TABS: 60.0000 mg | ORAL_TABLET | Freq: Every day | ORAL | 0 refills | Status: DC

## 2018-10-01 NOTE — Discharge Instructions (Signed)
Acute Respiratory Failure, Adult  Acute respiratory failure occurs when there is not enough oxygen passing from your lungs to your body. When this happens, your lungs have trouble removing carbon dioxide from the blood. This causes your blood oxygen level to drop too low as carbon dioxide builds up. Acute respiratory failure is a medical emergency. It can develop quickly, but it is temporary if treated promptly. Your lung capacity, or how much air your lungs can hold, may improve with time, exercise, and treatment. What are the causes? There are many possible causes of acute respiratory failure, including:  Lung injury.  Chest injury or damage to the ribs or tissues near the lungs.  Lung conditions that affect the flow of air and blood into and out of the lungs, such as pneumonia, acute respiratory distress syndrome, and cystic fibrosis.  Medical conditions, such as strokes or spinal cord injuries, that affect the muscles and nerves that control breathing.  Blood infection (sepsis).  Inflammation of the pancreas (pancreatitis).  A blood clot in the lungs (pulmonary embolism).  A large-volume blood transfusion.  Burns.  Near-drowning.  Seizure.  Smoke inhalation.  Reaction to medicines.  Alcohol or drug overdose. What increases the risk? This condition is more likely to develop in people who have:  A blocked airway.  Asthma.  A condition or disease that damages or weakens the muscles, nerves, bones, or tissues that are involved in breathing.  A serious infection.  A health problem that blocks the unconscious reflex that is involved in breathing, such as hypothyroidism or sleep apnea.  A lung injury or trauma. What are the signs or symptoms? Trouble breathing is the main symptom of acute respiratory failure. Symptoms may also include:  Rapid breathing.  Restlessness or anxiety.  Skin, lips, or fingernails that appear blue (cyanosis).  Rapid heart  rate.  Abnormal heart rhythms (arrhythmias).  Confusion or changes in behavior.  Tiredness or loss of energy.  Feeling sleepy or having a loss of consciousness. How is this diagnosed? Your health care provider can diagnose acute respiratory failure with a medical history and physical exam. During the exam, your health care provider will listen to your heart and check for crackling or wheezing sounds in your lungs. Your may also have tests to confirm the diagnosis and determine what is causing respiratory failure. These tests may include:  Measuring the amount of oxygen in your blood (pulse oximetry). The measurement comes from a small device that is placed on your finger, earlobe, or toe.  Other blood tests to measure blood gases and to look for signs of infection.  Sampling your cerebral spinal fluid or tracheal fluid to check for infections.  Chest X-ray to look for fluid in spaces that should be filled with air.  Electrocardiogram (ECG) to look at the heart's electrical activity. How is this treated? Treatment for this condition usually takes places in a hospital intensive care unit (ICU). Treatment depends on what is causing the condition. It may include one or more treatments until your symptoms improve. Treatment may include:  Supplemental oxygen. Extra oxygen is given through a tube in the nose, a face mask, or a hood.  A device such as a continuous positive airway pressure (CPAP) or bi-level positive airway pressure (BiPAP or BPAP) machine. This treatment uses mild air pressure to keep the airways open. A mask or other device will be placed over your nose or mouth. A tube that is connected to a motor will deliver oxygen through  the mask.  Ventilator. This treatment helps move air into and out of the lungs. This may be done with a bag and mask or a machine. For this treatment, a tube is placed in your windpipe (trachea) so air and oxygen can flow to the lungs.  Extracorporeal  membrane oxygenation (ECMO). This treatment temporarily takes over the function of the heart and lungs, supplying oxygen and removing carbon dioxide. ECMO gives the lungs a chance to recover. It may be used if a ventilator is not effective.  Tracheostomy. This is a procedure that creates a hole in the neck to insert a breathing tube.  Receiving fluids and medicines.  Rocking the bed to help breathing. Follow these instructions at home:  Take over-the-counter and prescription medicines only as told by your health care provider.  Return to normal activities as told by your health care provider. Ask your health care provider what activities are safe for you.  Keep all follow-up visits as told by your health care provider. This is important. How is this prevented? Treating infections and medical conditions that may lead to acute respiratory failure can help prevent the condition from developing. Contact a health care provider if:  You have a fever.  Your symptoms do not improve or they get worse. Get help right away if:  You are having trouble breathing.  You lose consciousness.  Your have cyanosis or turn blue.  You develop a rapid heart rate.  You are confused. These symptoms may represent a serious problem that is an emergency. Do not wait to see if the symptoms will go away. Get medical help right away. Call your local emergency services (911 in the [place] Do not drive yourself to the hospital. This information is not intended to replace advice given to you by your health care provider. Make sure you discuss any questions you have with your health care provider. Document Released: 01/09/2013 Document Revised: 12/17/2016 Document Reviewed: 07/23/2015 Elsevier Patient Education  [place]. Use your oxygen, nebulizer and inhalers as before smoking cessation advised

## 2018-10-01 NOTE — Progress Notes (Signed)
Pt discharged home accompanied by significant other with his home 02 supply here. Discharge to home to self/family care.

## 2018-10-01 NOTE — Discharge Summary (Addendum)
[facility] [place] NAME: Kevin Nelson    MR#:  [id number]  DATE OF BIRTH:  1950/04/23  DATE OF ADMISSION:  09/29/2018 ADMITTING PHYSICIAN: Henreitta Leber, MD  DATE OF DISCHARGE: 10/01/2018  PRIMARY CARE PHYSICIAN: [name], NP    ADMISSION DIAGNOSIS:  sob  DISCHARGE DIAGNOSIS:  acute on chronic respiratory failure with hypoxia secondary to COPD exacerbation  SECONDARY DIAGNOSIS:   Past Medical History:  Diagnosis Date  . Basal cell carcinoma   . End stage COPD ([place])   . History of nonmelanoma skin cancer   . Prostate cancer ([place])   . Squamous cell skin cancer     HOSPITAL COURSE:   68 y.o.malewith a known history of end-stage COPD, prostate cancer, history of squamous cell skin cancer who presents to the hospital complaining of worsening shortness of breath.  1.Acute on chronic respiratory failure with hypoxia-secondary to COPD exacerbation. -Continue O2 supplementation, will treat underlying COPD with IV steroids--change to po steroids, scheduled duo nebs, Pulmicort nebs. -Patient uses chronic oxygen at home  2. COPD exacerbation-secondary to ongoing tobacco abuse.  -Patient's chest x-ray does not show any evidence of acute pneumonia. He does have a productive sputum though. - We will also give empiric doxycycline for 5 days.  3.History of prostate cancer-patient has finished treatment with Lupron. Continue follow-up with urology and oncology as an outpatient.  4. Anxiety-continue Klonopin as needed.  Patient is at baseline. Overall feels better. Pt Will discharged to home.  CONSULTS OBTAINED:    DRUG ALLERGIES:  No Known Allergies  DISCHARGE MEDICATIONS:   Allergies as of 10/01/2018   No Known Allergies     Medication List    TAKE these medications   albuterol (2.5 MG/3ML) 0.083% nebulizer solution Commonly known as: PROVENTIL USE 1 VIAL(3 MLS) BY NEBULIZATION EVERY 6 HOURS AS  NEEDED FOR WHEEZING OR SHORTNESS OF BREATH What changed: See the new instructions.   clonazePAM 0.5 MG tablet Commonly known as: KLONOPIN TAKE 1 TABLET BY MOUTH TWICE DAILY AS NEEDED FOR ANXIETY What changed:   when to take this  additional instructions   doxycycline 100 MG tablet Commonly known as: VIBRA-TABS Take 1 tablet (100 mg total) by mouth every 12 (twelve) hours.   Ipratropium-Albuterol 20-100 MCG/ACT Aers respimat Commonly known as: Combivent Respimat Inhale 1 puff into the lungs every 6 (six) hours as needed.   predniSONE 10 MG tablet Commonly known as: DELTASONE Take 6 tablets (60 mg total) by mouth daily with breakfast. Start taking on: October 02, 2018   Tiotropium Bromide-Olodaterol 2.5-2.5 MCG/ACT Aers Commonly known as: Stiolto Respimat Inhale 2 puffs into the lungs daily.       If you experience worsening of your admission symptoms, develop shortness of breath, life threatening emergency, suicidal or homicidal thoughts you must seek medical attention immediately by calling 911 or calling your MD immediately  if symptoms less severe.  You Must read complete instructions/literature along with all the possible adverse reactions/side effects for all the Medicines you take and that have been prescribed to you. Take any new Medicines after you have completely understood and accept all the possible adverse reactions/side effects.   Please note  You were cared for by a hospitalist during your hospital stay. If you have any questions about your discharge medications or the care you received while you were in the hospital after you are discharged, you can call the unit and asked to speak with the  hospitalist on call if the hospitalist that took care of you is not available. Once you are discharged, your primary care physician will handle any further medical issues. Please note that NO REFILLS for any discharge medications will be authorized once you are discharged, as  it is imperative that you return to your primary care physician (or establish a relationship with a primary care physician if you do not have one) for your aftercare needs so that they can reassess your need for medications and monitor your lab values. Today   SUBJECTIVE   Feels a lot better.  VITAL SIGNS:  Blood pressure 115/67, pulse 82, temperature 98 F (36.7 C), temperature source Oral, resp. rate 20, height 5\' 7"  (1.702 m), weight 50.8 kg, SpO2 97 %.  I/O:    Intake/Output Summary (Last 24 hours) at 10/01/2018 1129 Last data filed at 10/01/2018 0900 Gross per 24 hour  Intake 1200 ml  Output 1 ml  Net 1199 ml    PHYSICAL EXAMINATION:  GENERAL:  68 y.o.-year-old patient lying in the bed with no acute distress. thin EYES: Pupils equal, round, reactive to light and accommodation. No scleral icterus. Extraocular muscles intact.  HEENT: Head atraumatic, normocephalic. Oropharynx and nasopharynx clear.  NECK:  Supple, no jugular venous distention. No thyroid enlargement, no tenderness.  LUNGS: distant breath sounds bilaterally, no wheezing, rales,rhonchi or crepitation. No use of accessory muscles of respiration.  CARDIOVASCULAR: S1, S2 normal. No murmurs, rubs, or gallops.  ABDOMEN: Soft, non-tender, non-distended. Bowel sounds present. No organomegaly or mass.  EXTREMITIES: No pedal edema, cyanosis, or clubbing.  NEUROLOGIC: Cranial nerves II through XII are intact. Muscle strength 5/5 in all extremities. Sensation intact. Gait not checked.  PSYCHIATRIC:  patient is alert and oriented x 3.  SKIN: No obvious rash, lesion, or ulcer.   DATA REVIEW:   CBC  Recent Labs  Lab 09/29/18 1243  WBC 14.1*  HGB 12.2*  HCT 38.4*  PLT 384    Chemistries  Recent Labs  Lab 09/29/18 1243  NA 137  K 3.8  CL 93*  CO2 35*  GLUCOSE 110*  BUN 8  CREATININE 0.39*  CALCIUM 8.8*  AST 16  ALT 12  ALKPHOS 61  BILITOT 0.4    Microbiology Results   Recent Results (from the past  240 hour(s))  SARS Coronavirus 2 [facility] order, Performed in [facility] hospital lab) Nasopharyngeal Nasopharyngeal Swab     Status: None   Collection Time: 09/29/18 12:43 PM   Specimen: Nasopharyngeal Swab  Result Value Ref Range Status   SARS Coronavirus 2 NEGATIVE NEGATIVE Final    Comment: (NOTE) If result is NEGATIVE SARS-CoV-2 target nucleic acids are NOT DETECTED. The SARS-CoV-2 RNA is generally detectable in upper and lower  respiratory specimens during the acute phase of infection. The lowest  concentration of SARS-CoV-2 viral copies this assay can detect is 250  copies / mL. A negative result does not preclude SARS-CoV-2 infection  and should not be used as the sole basis for treatment or other  patient management decisions.  A negative result may occur with  improper specimen collection / handling, submission of specimen other  than nasopharyngeal swab, presence of viral mutation(s) within the  areas targeted by this assay, and inadequate number of viral copies  (<250 copies / mL). A negative result must be combined with clinical  observations, patient history, and epidemiological information. If result is POSITIVE SARS-CoV-2 target nucleic acids are DETECTED. The SARS-CoV-2 RNA is generally detectable in upper  and lower  respiratory specimens dur ing the acute phase of infection.  Positive  results are indicative of active infection with SARS-CoV-2.  Clinical  correlation with patient history and other diagnostic information is  necessary to determine patient infection status.  Positive results do  not rule out bacterial infection or co-infection with other viruses. If result is PRESUMPTIVE POSTIVE SARS-CoV-2 nucleic acids MAY BE PRESENT.   A presumptive positive result was obtained on the submitted specimen  and confirmed on repeat testing.  While 2019 novel coronavirus  (SARS-CoV-2) nucleic acids may be present in the submitted sample  additional confirmatory  testing may be necessary for epidemiological  and / or clinical management purposes  to differentiate between  SARS-CoV-2 and other Sarbecovirus currently known to infect humans.  If clinically indicated additional testing with an alternate test  methodology [phone]) is advised. The SARS-CoV-2 RNA is generally  detectable in upper and lower respiratory sp ecimens during the acute  phase of infection. The expected result is Negative. Fact Sheet for Patients:  StrictlyIdeas.no Fact Sheet for Healthcare Providers: BankingDealers.co.za This test is not yet approved or cleared by the [place] FDA and has been authorized for detection and/or diagnosis of SARS-CoV-2 by FDA under an Emergency Use Authorization (EUA).  This EUA will remain in effect (meaning this test can be used) for the duration of the COVID-19 declaration under Section 564(b)(1) of the Act, 21 U.S.C. section 360bbb-3(b)(1), unless the authorization is terminated or revoked sooner. Performed at [facility], [address] [place], [place] [address]     RADIOLOGY:  Dg Chest Portable 1 View  Result Date: 09/29/2018 CLINICAL DATA:  Increasing shortness of breath. COPD. EXAM: PORTABLE CHEST 1 VIEW COMPARISON:  04/14/2018 FINDINGS: The heart size and pulmonary vascularity are normal. No infiltrates or effusions. The lungs are hyperinflated consistent with the patient's history of COPD. Chronic peribronchial thickening. Enlargement of the main pulmonary artery suggesting pulmonary arterial hypertension. IMPRESSION: No acute cardiopulmonary disease. COPD. Chronic bronchitic changes. Enlargement of the main pulmonary artery suggesting pulmonary arterial hypertension. Electronically Signed   By: Lorriane Shire M.D.   On: 09/29/2018 14:31     CODE STATUS:     Code Status Orders  (From admission, onward)         Start     Ordered   09/29/18 1745  Do not attempt  resuscitation (DNR)  Continuous    Question Answer Comment  In the event of cardiac or respiratory ARREST Do not call a "code blue"   In the event of cardiac or respiratory ARREST Do not perform Intubation, CPR, defibrillation or ACLS   In the event of cardiac or respiratory ARREST Use medication by any route, position, wound care, and other measures to relive pain and suffering. May use oxygen, suction and manual treatment of airway obstruction as needed for comfort.      09/29/18 1744        Code Status History    Date Active Date Inactive Code Status Order ID Comments User Context   04/15/2018 0220 04/16/2018 1546 Full Code [id number]  Marygrace Drought, MD Inpatient   Advance Care Planning Activity      TOTAL TIME TAKING CARE OF THIS PATIENT: *40* minutes.    Fritzi Mandes M.D on 10/01/2018 at 11:29 AM  Between 7am to 6pm - Pager - [phone] After 6pm go to [email or web address] - password [email or web address] [facility]  Office  [phone]  CC: Primary care physician; Marnee Guarneri  T, NP

## 2018-10-01 NOTE — Progress Notes (Signed)
Pt states he is ready to go to home and agreeable with discharge. Oral and written discharge instructions given to pt and girlfriend with stated understanding. Questions answered to their satisfaction. Ready for discharge home to self/family care.

## 2018-10-02 ENCOUNTER — Encounter: Payer: Self-pay | Admitting: Nurse Practitioner

## 2018-10-02 ENCOUNTER — Ambulatory Visit (INDEPENDENT_AMBULATORY_CARE_PROVIDER_SITE_OTHER): Payer: Medicare Other | Admitting: Nurse Practitioner

## 2018-10-02 ENCOUNTER — Other Ambulatory Visit: Payer: Self-pay

## 2018-10-02 VITALS — BP 132/78 | HR 90 | Temp 98.4°F

## 2018-10-02 DIAGNOSIS — J441 Chronic obstructive pulmonary disease with (acute) exacerbation: Secondary | ICD-10-CM

## 2018-10-02 NOTE — Patient Instructions (Signed)
COPD and Physical Activity °Chronic obstructive pulmonary disease (COPD) is a long-term (chronic) condition that affects the lungs. COPD is a general term that can be used to describe many different lung problems that cause lung swelling (inflammation) and limit airflow, including chronic bronchitis and emphysema. °The main symptom of COPD is shortness of breath, which makes it harder to do even simple tasks. This can also make it harder to exercise and be active. Talk with your health care provider about treatments to help you breathe better and actions you can take to prevent breathing problems during physical activity. °What are the benefits of exercising with COPD? °Exercising regularly is an important part of a healthy lifestyle. You can still exercise and do physical activities even though you have COPD. Exercise and physical activity improve your shortness of breath by increasing blood flow (circulation). This causes your heart to pump more oxygen through your body. Moderate exercise can improve your: °· Oxygen use. °· Energy level. °· Shortness of breath. °· Strength in your breathing muscles. °· Heart health. °· Sleep. °· Self-esteem and feelings of self-worth. °· Depression, stress, and anxiety levels. °Exercise can benefit everyone with COPD. The severity of your disease may affect how hard you can exercise, especially at first, but everyone can benefit. Talk with your health care provider about how much exercise is safe for you, and which activities and exercises are safe for you. °What actions can I take to prevent breathing problems during physical activity? °· Sign up for a pulmonary rehabilitation program. This type of program may include: °? Education about lung diseases. °? Exercise classes that teach you how to exercise and be more active while improving your breathing. This usually involves: °§ Exercise using your lower extremities, such as a stationary bicycle. °§ About 30 minutes of exercise, 2  to 5 times per week, for 6 to 12 weeks °§ Strength training, such as push ups or leg lifts. °? Nutrition education. °? Group classes in which you can talk with others who also have COPD and learn ways to manage stress. °· If you use an oxygen tank, you should use it while you exercise. Work with your health care provider to adjust your oxygen for your physical activity. Your resting flow rate is different from your flow rate during physical activity. °· While you are exercising: °? Take slow breaths. °? Pace yourself and do not try to go too fast. °? Purse your lips while breathing out. Pursing your lips is similar to a kissing or whistling position. °? If doing exercise that uses a quick burst of effort, such as weight lifting: °§ Breathe in before starting the exercise. °§ Breathe out during the hardest part of the exercise (such as raising the weights). °Where to find support °You can find support for exercising with COPD from: °· Your health care provider. °· A pulmonary rehabilitation program. °· Your local health department or community health programs. °· Support groups, online or in-person. Your health care provider may be able to recommend support groups. °Where to find more information °You can find more information about exercising with COPD from: °· American Lung Association: lung.org. °· COPD Foundation: copdfoundation.org. °Contact a health care provider if: °· Your symptoms get worse. °· You have chest pain. °· You have nausea. °· You have a fever. °· You have trouble talking or catching your breath. °· You want to start a new exercise program or a new activity. °Summary °· COPD is a general term that can   be used to describe many different lung problems that cause lung swelling (inflammation) and limit airflow. This includes chronic bronchitis and emphysema. °· Exercise and physical activity improve your shortness of breath by increasing blood flow (circulation). This causes your heart to provide more  oxygen to your body. °· Contact your health care provider before starting any exercise program or new activity. Ask your health care provider what exercises and activities are safe for you. °This information is not intended to replace advice given to you by your health care provider. Make sure you discuss any questions you have with your health care provider. °Document Released: 01/27/2017 Document Revised: 04/26/2018 Document Reviewed: 01/27/2017 °Elsevier Patient Education © 2020 [employer]. ° °

## 2018-10-02 NOTE — Progress Notes (Signed)
BP 132/78   Pulse 90   Temp 98.4 F (36.9 C) (Oral)   SpO2 94%    Subjective:    Patient ID: Kevin Nelson, male    DOB: [birth year], 68 y.o.   MRN: [id number]  HPI: Kevin Nelson is a 68 y.o. male  Chief Complaint  Patient presents with  . Hospitalization Follow-up   Transition of Care Hospital Follow up.  Admitted 09/29/2018 to hospital for COPD exacerbation, he was discharged home on Prednisone and Doxycycline. Imaging noted no pneumonia and Covid was negative.  HOSPITAL COURSE:   68 y.o.malewith a known history of end-stage COPD, prostate cancer, history of squamous cell skin cancer who presents to the hospital complaining of worsening shortness of breath.  1.Acute on chronic respiratory failure with hypoxia-secondary to COPD exacerbation. -Continue O2 supplementation, will treat underlying COPD with IV steroids--change to po steroids, scheduled duo nebs, Pulmicort nebs. -Patient uses chronic oxygen at home  2. COPD exacerbation-secondary to ongoing tobacco abuse.  -Patient's chest x-ray does not show any evidence of acute pneumonia. He does have a productive sputum though. - We will also give empiric doxycycline for 5 days.  3.History of prostate cancer-patient has finished treatment with Lupron. Continue follow-up with urology and oncology as an outpatient.  4. Anxiety-continue Klonopin as needed.  Patient is at baseline. Overall feels better. Pt Will discharged to home.  Hospital/Facility: 09/29/2018 D/C Physician: Dr. Posey Pronto D/C Date: 10/01/2018  Records Requested: 10/02/2018 Records Received: 10/02/2018 Records Reviewed: 10/02/2018  Diagnoses on Discharge: Acute on chronic respiratory failure with hypoxia secondary to COPD exacerbation  Date of interactive Contact within 48 hours of discharge:  Contact was through: phone  Date of 7 day or 14 day face-to-face visit:    within 7 days  Outpatient Encounter Medications as of 10/02/2018  Medication Sig  Note  . albuterol (PROVENTIL) (2.5 MG/3ML) 0.083% nebulizer solution USE 1 VIAL(3 MLS) BY NEBULIZATION EVERY 6 HOURS AS NEEDED FOR WHEEZING OR SHORTNESS OF BREATH (Patient taking differently: Take 2.5 mg by nebulization 2 (two) times daily. )   . clonazePAM (KLONOPIN) 0.5 MG tablet TAKE 1 TABLET BY MOUTH TWICE DAILY AS NEEDED FOR ANXIETY (Patient taking differently: Take 0.5 mg by mouth 2 (two) times daily. )   . doxycycline (VIBRA-TABS) 100 MG tablet Take 1 tablet (100 mg total) by mouth every 12 (twelve) hours.   . Ipratropium-Albuterol (COMBIVENT RESPIMAT) 20-100 MCG/ACT AERS respimat Inhale 1 puff into the lungs every 6 (six) hours as needed.   . predniSONE (DELTASONE) 10 MG tablet Take 6 tablets (60 mg total) by mouth daily with breakfast.   . Tiotropium Bromide-Olodaterol (STIOLTO RESPIMAT) 2.5-2.5 MCG/ACT AERS Inhale 2 puffs into the lungs daily. 09/29/2018: Has not started   No facility-administered encounter medications on file as of 10/02/2018.     Diagnostic Tests Reviewed/Disposition:   CBC  Last Labs      Recent Labs  Lab 09/29/18 1243  WBC 14.1*  HGB 12.2*  HCT 38.4*  PLT 384      Chemistries  Last Labs [] Expand by Default     Recent Labs  Lab 09/29/18 1243  NA 137  K 3.8  CL 93*  CO2 35*  GLUCOSE 110*  BUN 8  CREATININE 0.39*  CALCIUM 8.8*  AST 16  ALT 12  ALKPHOS 61  BILITOT 0.4      Microbiology Results          Recent Results (from the past 240 hour(s))  SARS Coronavirus 2 [facility]  order, Performed in [facility] hospital lab) Nasopharyngeal Nasopharyngeal Swab     Status: None   Collection Time: 09/29/18 12:43 PM   Specimen: Nasopharyngeal Swab  Result Value Ref Range Status   SARS Coronavirus 2 NEGATIVE NEGATIVE Final    Comment: (NOTE) If result is NEGATIVE SARS-CoV-2 target nucleic acids are NOT DETECTED. The SARS-CoV-2 RNA is generally detectable in upper and lower  respiratory specimens during the acute phase of infection. The  lowest  concentration of SARS-CoV-2 viral copies this assay can detect is 250  copies / mL. A negative result does not preclude SARS-CoV-2 infection  and should not be used as the sole basis for treatment or other  patient management decisions.  A negative result may occur with  improper specimen collection / handling, submission of specimen other  than nasopharyngeal swab, presence of viral mutation(s) within the  areas targeted by this assay, and inadequate number of viral copies  (<250 copies / mL). A negative result must be combined with clinical  observations, patient history, and epidemiological information. If result is POSITIVE SARS-CoV-2 target nucleic acids are DETECTED. The SARS-CoV-2 RNA is generally detectable in upper and lower  respiratory specimens dur ing the acute phase of infection.  Positive  results are indicative of active infection with SARS-CoV-2.  Clinical  correlation with patient history and other diagnostic information is  necessary to determine patient infection status.  Positive results do  not rule out bacterial infection or co-infection with other viruses. If result is PRESUMPTIVE POSTIVE SARS-CoV-2 nucleic acids MAY BE PRESENT.   A presumptive positive result was obtained on the submitted specimen  and confirmed on repeat testing.  While 2019 novel coronavirus  (SARS-CoV-2) nucleic acids may be present in the submitted sample  additional confirmatory testing may be necessary for epidemiological  and / or clinical management purposes  to differentiate between  SARS-CoV-2 and other Sarbecovirus currently known to infect humans.  If clinically indicated additional testing with an alternate test  methodology [phone]) is advised. The SARS-CoV-2 RNA is generally  detectable in upper and lower respiratory sp ecimens during the acute  phase of infection. The expected result is Negative. Fact Sheet for Patients:  StrictlyIdeas.no  Fact Sheet for Healthcare Providers: BankingDealers.co.za This test is not yet approved or cleared by the [place] FDA and has been authorized for detection and/or diagnosis of SARS-CoV-2 by FDA under an Emergency Use Authorization (EUA).  This EUA will remain in effect (meaning this test can be used) for the duration of the COVID-19 declaration under Section 564(b)(1) of the Act, 21 U.S.C. section 360bbb-3(b)(1), unless the authorization is terminated or revoked sooner. Performed at [facility], [address] [place], [place] [address]   COMPARISON:  04/14/2018  FINDINGS: The heart size and pulmonary vascularity are normal. No infiltrates or effusions. The lungs are hyperinflated consistent with the patient's history of COPD. Chronic peribronchial thickening. Enlargement of the main pulmonary artery suggesting pulmonary arterial hypertension.  IMPRESSION: No acute cardiopulmonary disease. COPD. Chronic bronchitic changes. Enlargement of the main pulmonary artery suggesting pulmonary arterial hypertension.  Electronically Signed   By: Lorriane Shire M.D.   On: 09/29/2018 14:31  Consults: none  Discharge Instructions: follow-up with PCP  Disease/illness Education: COPD trajectory education  Home Health/Community Services Discussions/Referrals: none  Establishment or re-establishment of referral orders for community resources: none  Discussion with other health care providers: reviewed notes  Assessment and Support of treatment regimen adherence: reviewed with patient  Appointments Coordinated with:  reviewed with patient  Education for self-management, independent living, and ADLs: reviewed with patient  COPD Last saw Dr. Alva Garnet, July 2020. He continues to smoke daily, but has a plan to quit along with his partner.  They plan on reducing by one cigarette a week, 5 cigarettes a day presently. COPD status: stable Satisfied  with current treatment?: yes Oxygen use: yes, 2L a day Dyspnea frequency: occasional Cough frequency: chronic, improved at this time Rescue inhaler frequency:  Once a day Limitation of activity: no Productive cough: improved Last Spirometry: 02/14/18 --- FVC: 2.28 L (56 %pred), FEV1: 0.80 L (25 %pred), FEV1/FVC: 35% Pneumovax: Not up to Date, refuses today Influenza: Not up to Date, refuses today  Relevant past medical, surgical, family and social history reviewed and updated as indicated. Interim medical history since our last visit reviewed. Allergies and medications reviewed and updated.  Review of Systems  Constitutional: Negative for activity change, diaphoresis, fatigue and fever.  Respiratory: Positive for cough and wheezing. Negative for chest tightness and shortness of breath.   Cardiovascular: Negative for chest pain, palpitations and leg swelling.  Gastrointestinal: Negative for abdominal distention, abdominal pain, constipation, diarrhea, nausea and vomiting.  Neurological: Negative for dizziness, syncope, weakness, light-headedness, numbness and headaches.  Psychiatric/Behavioral: Negative.     Per HPI unless specifically indicated above     Objective:    BP 132/78   Pulse 90   Temp 98.4 F (36.9 C) (Oral)   SpO2 94%   Wt Readings from Last 3 Encounters:  09/29/18 112 lb (50.8 kg)  07/31/18 106 lb (48.1 kg)  06/19/18 115 lb (52.2 kg)    Physical Exam Vitals signs and nursing note reviewed.  Constitutional:      General: He is awake. He is not in acute distress.    Appearance: He is well-developed and underweight. He is not ill-appearing.  HENT:     Head: Normocephalic and atraumatic.     Right Ear: Hearing normal. No drainage.     Left Ear: Hearing normal. No drainage.  Eyes:     General: Lids are normal.        Right eye: No discharge.        Left eye: No discharge.     Conjunctiva/sclera: Conjunctivae normal.     Pupils: Pupils are equal, round, and  reactive to light.  Neck:     Musculoskeletal: Normal range of motion and neck supple.     Vascular: No carotid bruit.  Cardiovascular:     Rate and Rhythm: Normal rate and regular rhythm.     Heart sounds: Normal heart sounds, S1 normal and S2 normal. No murmur. No gallop.   Pulmonary:     Effort: Pulmonary effort is normal. No accessory muscle usage or respiratory distress.     Breath sounds: Decreased breath sounds present.     Comments: Wearing O2 via [place] at 2 L.  No SOB with talking.  Intermittent nonproductive cough.   Abdominal:     General: Bowel sounds are normal.     Palpations: Abdomen is soft.  Musculoskeletal: Normal range of motion.     Right lower leg: No edema.     Left lower leg: No edema.  Skin:    General: Skin is warm and dry.     Capillary Refill: Capillary refill takes less than 2 seconds.     Findings: No rash.  Neurological:     Mental Status: He is alert and oriented to person, place, and time.  Deep Tendon Reflexes: Reflexes are normal and symmetric.  Psychiatric:        Mood and Affect: Mood normal.        Behavior: Behavior normal. Behavior is cooperative.        Thought Content: Thought content normal.        Judgment: Judgment normal.     Results for orders placed or performed during the hospital encounter of 09/29/18  SARS Coronavirus 2 [facility] order, Performed in [facility] hospital lab) Nasopharyngeal Nasopharyngeal Swab   Specimen: Nasopharyngeal Swab  Result Value Ref Range   SARS Coronavirus 2 NEGATIVE NEGATIVE  CBC with Differential  Result Value Ref Range   WBC 14.1 (H) 4.0 - 10.5 K/uL   RBC 4.23 4.22 - 5.81 MIL/uL   Hemoglobin 12.2 (L) 13.0 - 17.0 g/dL   HCT 38.4 (L) 39.0 - 52.0 %   MCV 90.8 80.0 - 100.0 fL   MCH 28.8 26.0 - 34.0 pg   MCHC 31.8 30.0 - 36.0 g/dL   RDW 12.9 11.5 - 15.5 %   Platelets 384 150 - 400 K/uL   nRBC 0.0 0.0 - 0.2 %   Neutrophils Relative % 77 %   Neutro Abs 11.0 (H) 1.7 - 7.7 K/uL   Lymphocytes  Relative 10 %   Lymphs Abs 1.4 0.7 - 4.0 K/uL   Monocytes Relative 10 %   Monocytes Absolute 1.4 (H) 0.1 - 1.0 K/uL   Eosinophils Relative 2 %   Eosinophils Absolute 0.3 0.0 - 0.5 K/uL   Basophils Relative 1 %   Basophils Absolute 0.1 0.0 - 0.1 K/uL   Immature Granulocytes 0 %   Abs Immature Granulocytes 0.04 0.00 - 0.07 K/uL  Comprehensive metabolic panel  Result Value Ref Range   Sodium 137 135 - 145 mmol/L   Potassium 3.8 3.5 - 5.1 mmol/L   Chloride 93 (L) 98 - 111 mmol/L   CO2 35 (H) 22 - 32 mmol/L   Glucose, Bld 110 (H) 70 - 99 mg/dL   BUN 8 8 - 23 mg/dL   Creatinine, Ser 0.39 (L) 0.61 - 1.24 mg/dL   Calcium 8.8 (L) 8.9 - 10.3 mg/dL   Total Protein 6.6 6.5 - 8.1 g/dL   Albumin 3.7 3.5 - 5.0 g/dL   AST 16 15 - 41 U/L   ALT 12 0 - 44 U/L   Alkaline Phosphatase 61 38 - 126 U/L   Total Bilirubin 0.4 0.3 - 1.2 mg/dL   GFR calc non Af Amer >60 >60 mL/min   GFR calc Af Amer >60 >60 mL/min   Anion gap 9 5 - 15  Brain natriuretic peptide  Result Value Ref Range   B Natriuretic Peptide 43.0 0.0 - 100.0 pg/mL      Assessment & Plan:   Problem List Items Addressed This Visit      Respiratory   COPD exacerbation ([place]) - Primary    Improving at this time.  Continue Doxycycline and Prednisone doses.  Continue collaboration with pulmonary team.  Will recheck CBC and CMP today.  Return in 4 weeks for follow-up or sooner if any worsening of symptoms.      Relevant Orders   CBC with Differential/Platelet   Comprehensive metabolic panel       Follow up plan: Return in about 1 month (around 11/01/2018) for COPD, Anxiety.

## 2018-10-02 NOTE — Assessment & Plan Note (Signed)
Improving at this time.  Continue Doxycycline and Prednisone doses.  Continue collaboration with pulmonary team.  Will recheck CBC and CMP today.  Return in 4 weeks for follow-up or sooner if any worsening of symptoms.

## 2018-10-03 ENCOUNTER — Ambulatory Visit: Payer: Medicare Other | Admitting: Urology

## 2018-10-09 ENCOUNTER — Other Ambulatory Visit: Payer: Self-pay

## 2018-10-09 ENCOUNTER — Ambulatory Visit (INDEPENDENT_AMBULATORY_CARE_PROVIDER_SITE_OTHER): Payer: Medicare Other | Admitting: Urology

## 2018-10-09 ENCOUNTER — Encounter: Payer: Self-pay | Admitting: Urology

## 2018-10-09 VITALS — BP 146/68 | HR 100 | Ht 66.0 in | Wt 104.0 lb

## 2018-10-09 DIAGNOSIS — C61 Malignant neoplasm of prostate: Secondary | ICD-10-CM

## 2018-10-09 NOTE — Progress Notes (Signed)
10/09/2018 3:32 PM   [name] 1950-11-14 [id number]  Referring provider: [name], NP [address] [place],  [place] [address]  Chief Complaint  Patient presents with  . Prostate Cancer    HPI: 68 y.o. male seen by Dr. Erlene Quan in January 2020.  He had a history of intermediate risk (unfavorable) prostate cancer diagnosed April 2017 in [place].  Biopsy showed Gleason 3+4/4+3 disease.  Prostate volume was 18 cc.  He had an elevated prostatic acid phosphatase and was felt to have metastatic disease.  No metastatic work-up was pursued.  He was treated with androgen ablation as his life expectancy was <10 years.  He had significant side effects with androgen deprivation and did not return for Lupron.  His initial PSA was elevated at 78.  He subsequently moved to the [place] area and followed up here.  He received a Lupron injection January 2020.  PSA prior to that injection was 1.2 as he had also received a Lupron in [place].  He still complains of significant hot flashes, tiredness, fatigue.  He has refused further androgen deprivation therapy unless his PSA is rising.     PMH: Past Medical History:  Diagnosis Date  . Basal cell carcinoma   . End stage COPD ([place])   . History of nonmelanoma skin cancer   . Prostate cancer ([place])   . Squamous cell skin cancer     Surgical History: Past Surgical History:  Procedure Laterality Date  . CHOLECYSTECTOMY    . FACIAL LACERATION REPAIR    . SKIN CANCER EXCISION      Home Medications:  Allergies as of 10/09/2018   No Known Allergies     Medication List       Accurate as of October 09, 2018  3:32 PM. If you have any questions, ask your nurse or doctor.        STOP taking these medications   doxycycline 100 MG tablet Commonly known as: VIBRA-TABS Stopped by: Abbie Sons, MD   predniSONE 10 MG tablet Commonly known as: DELTASONE Stopped by: Abbie Sons, MD     TAKE these medications   albuterol  (2.5 MG/3ML) 0.083% nebulizer solution Commonly known as: PROVENTIL USE 1 VIAL(3 MLS) BY NEBULIZATION EVERY 6 HOURS AS NEEDED FOR WHEEZING OR SHORTNESS OF BREATH What changed: See the new instructions.   clonazePAM 0.5 MG tablet Commonly known as: KLONOPIN TAKE 1 TABLET BY MOUTH TWICE DAILY AS NEEDED FOR ANXIETY What changed:   when to take this  additional instructions   Ipratropium-Albuterol 20-100 MCG/ACT Aers respimat Commonly known as: Combivent Respimat Inhale 1 puff into the lungs every 6 (six) hours as needed.   Tiotropium Bromide-Olodaterol 2.5-2.5 MCG/ACT Aers Commonly known as: Stiolto Respimat Inhale 2 puffs into the lungs daily.       Allergies: No Known Allergies  Family History: Family History  Problem Relation Age of Onset  . Cancer Sister   . Cancer - Cervical Mother   . Heart attack Brother   . Heart attack Maternal Grandfather     Social History:  reports that he has been smoking cigarettes. He has a 12.75 pack-year smoking history. He has never used smokeless tobacco. He reports current alcohol use of about 3.0 standard drinks of alcohol per week. He reports that he does not use drugs.  ROS: UROLOGY Frequent Urination?: Yes Hard to postpone urination?: Yes Burning/pain with urination?: No Get up at night to urinate?: Yes Leakage of urine?:  No Urine stream starts and stops?: No Trouble starting stream?: No Do you have to strain to urinate?: No Blood in urine?: No Urinary tract infection?: No Sexually transmitted disease?: No Injury to kidneys or bladder?: No Painful intercourse?: No Weak stream?: No Erection problems?: Yes Penile pain?: No  Gastrointestinal Nausea?: No Vomiting?: No Indigestion/heartburn?: No Diarrhea?: No Constipation?: Yes  Constitutional Fever: Yes Night sweats?: Yes Weight loss?: Yes Fatigue?: Yes  Skin Skin rash/lesions?: Yes Itching?: Yes  Eyes Blurred vision?: Yes Double vision?: No   Ears/Nose/Throat Sore throat?: Yes Sinus problems?: Yes  Hematologic/Lymphatic Swollen glands?: No Easy bruising?: Yes  Cardiovascular Leg swelling?: No Chest pain?: Yes  Respiratory Cough?: Yes Shortness of breath?: Yes  Endocrine Excessive thirst?: No  Musculoskeletal Back pain?: Yes Joint pain?: No  Neurological Headaches?: Yes Dizziness?: No  Psychologic Depression?: Yes Anxiety?: Yes  Physical Exam: BP (!) 146/68 (BP Location: Left Arm, Patient Position: Sitting, Cuff Size: Normal)   Pulse 100   Ht 5\' 6"  (1.676 m)   Wt 104 lb (47.2 kg)   BMI 16.79 kg/m   Constitutional:  Alert and oriented, No acute distress. HEENT: [place] AT, moist mucus membranes.  Trachea midline, no masses. Cardiovascular: No clubbing, cyanosis, or edema. Respiratory: Normal respiratory effort, no increased work of breathing. GI: Abdomen is soft, nontender, nondistended, no abdominal masses GU: No CVA tenderness Lymph: No cervical or inguinal lymphadenopathy. Skin: No rashes, bruises or suspicious lesions. Neurologic: Grossly intact, no focal deficits, moving all 4 extremities. Psychiatric: Normal mood and affect.    Assessment & Plan:   - Adenocarcinoma prostate-intermediate risk (unfavorable) Presumed metastatic disease though no imaging.  He now has insurance and I discussed bone scan and CT imaging however he refused.  He states he would not have radiation therapy or surgery based on his life expectancy.  He basically desires intermittent androgen ablation as he has significant side effects with ADT.  A PSA was drawn today and he will notified with results and further recommendations.  Greater than 50% of this 25-minute visit was spent counseling the patient.   Abbie Sons, [place] [address] [place], [place] [address] [phone]

## 2018-10-10 LAB — TESTOSTERONE: Testosterone: <3 ng/dL — ABNORMAL LOW (ref 264–916)

## 2018-10-10 LAB — PSA: Prostate Specific Ag, Serum: 0.9 ng/mL (ref 0.0–4.0)

## 2018-10-11 ENCOUNTER — Telehealth: Payer: Self-pay | Admitting: Urology

## 2018-10-11 NOTE — Telephone Encounter (Signed)
App made and mailed ° °Kevin Nelson ° °

## 2018-10-11 NOTE — Telephone Encounter (Signed)
-----   Message from Abbie Sons, MD sent at 10/10/2018  5:52 PM EDT ----- PSA was 0.9.  Recommend lab visit/repeat PSA 3-4 months.

## 2018-10-11 NOTE — Telephone Encounter (Signed)
Patient notified and voiced understanding. Patient is aware of the lab appointment.

## 2018-10-13 ENCOUNTER — Ambulatory Visit: Payer: Medicare Other | Admitting: Licensed Clinical Social Worker

## 2018-10-13 DIAGNOSIS — F411 Generalized anxiety disorder: Secondary | ICD-10-CM

## 2018-10-13 NOTE — Chronic Care Management (AMB) (Signed)
Chronic Care Management    Clinical Social Work Follow Up Note  10/13/2018 Name: Kevin Nelson MRN: [id number] DOB: 11/27/1950  Kevin Nelson is a 68 y.o. year old male who is a primary care patient of Cannady, [name], NP. The CCM team was consulted for assistance with [employer] .   Review of patient status, including review of consultants reports, other relevant assessments, and collaboration with appropriate care team members and the patient's provider was performed as part of comprehensive patient evaluation and provision of chronic care management services.    SDOH (Social Determinants of Health) screening performed today: Stress. See Care Plan for related entries.   Outpatient Encounter Medications as of 10/13/2018  Medication Sig Note  . albuterol (PROVENTIL) (2.5 MG/3ML) 0.083% nebulizer solution USE 1 VIAL(3 MLS) BY NEBULIZATION EVERY 6 HOURS AS NEEDED FOR WHEEZING OR SHORTNESS OF BREATH (Patient taking differently: Take 2.5 mg by nebulization 2 (two) times daily. )   . clonazePAM (KLONOPIN) 0.5 MG tablet TAKE 1 TABLET BY MOUTH TWICE DAILY AS NEEDED FOR ANXIETY (Patient taking differently: Take 0.5 mg by mouth 2 (two) times daily. )   . Ipratropium-Albuterol (COMBIVENT RESPIMAT) 20-100 MCG/ACT AERS respimat Inhale 1 puff into the lungs every 6 (six) hours as needed.   . Tiotropium Bromide-Olodaterol (STIOLTO RESPIMAT) 2.5-2.5 MCG/ACT AERS Inhale 2 puffs into the lungs daily. 09/29/2018: Has not started   No facility-administered encounter medications on file as of 10/13/2018.      Goals Addressed    . SW-"I need more support. I'm anxious all the time" (pt-stated)       Current Barriers:  [name] Knowledge Deficits related to housing options/community resources that are available to him . Financial Constraints - during his most recent PCP visit, he voiced concerns about his housing and finances, currently lives with two friends (childhood friend)   Clinical Social Worker Clinical  Goal(s):  [name] Over the next 90 days, patient will verbalize understanding of plan for gaining crisis support resource education and implementing appropriate self-care tools into his daily routine to improve overall self-care . Over the next 90 days, patient will work with [name] to address needs related to housing, financial bariers and lack of self-care     Interventions: . Patient interviewed and appropriate assessments performed . Provided mental health counseling with regard to coping with daily stress. [name] LCSW provided education on deep breathing and relaxation techniques to implement into his daily routine to combat stressors. Patient admits that his anxiety continues but he has been challenging his negative thinking in order to gain a more positive perspective. Patient admits that he could benefit from socialization but due to [place] he has been staying at home and taking appropriate precautions. LCSW provided education on available mental health support resources and socialization opportunities within his community. . Provided patient with information about low income housing resources that are available. However, currently Section 8 and Public Housing wait list is still closed.  [name] LCSW provided reflective listening and implemented appropriate interventions to help suppport patient and her emotional needs  . Discussed plans with patient for ongoing care management follow up and provided patient with direct contact information for care management team . Advised patient to follow up with provided community resources as needed . Assisted patient/caregiver with obtaining information about health plan benefits . Patient confirms stable transportation to his upcoming PCP appointment next month. . Patient admits ongoing SOB. LCSW provided education on ways to induce the relaxation body response to  help with deep breathing.  . Patient successfully seeing his new neurologist.  . Patient confirms  that he will come to [facility] next week to complete needed lab work.   Patient Self Care Activities:  . Attends all scheduled provider appointments . Calls provider office for new concerns or questions  Please see past updates related to this goal by clicking on the "Past Updates" button in the selected goal      Follow Up Plan: SW will follow up with patient by phone over the next 45 days  Eula Fried, [place], MSW, [email or web address] Phone: [phone]

## 2018-10-17 ENCOUNTER — Telehealth: Payer: Self-pay | Admitting: Nurse Practitioner

## 2018-10-17 ENCOUNTER — Telehealth: Payer: Self-pay

## 2018-10-17 NOTE — Telephone Encounter (Signed)
Noted.  Thank you.  Yes, pulmonary was assisting with this previously.

## 2018-10-17 NOTE — Telephone Encounter (Signed)
Pt called to see if [name] can find out who his new Pulmonology provider is since his previous provider has moved/ that provider needs to sign off on his oxygen or [facility] will take it away. E also wanted to see if [name] can call [employer] to let them know he needs oxygen/ please advise

## 2018-10-17 NOTE — Telephone Encounter (Signed)
Routing to Andrews with [employer], she will reach out to patient to get him scheduled with the new doctor.

## 2018-10-18 NOTE — Telephone Encounter (Signed)
Pt has been scheduled for OV on 10/19/2018 with Dr. Patsey Berthold. Pt aware and voiced his understanding.

## 2018-10-19 ENCOUNTER — Ambulatory Visit (INDEPENDENT_AMBULATORY_CARE_PROVIDER_SITE_OTHER): Payer: Medicare Other | Admitting: Pulmonary Disease

## 2018-10-19 ENCOUNTER — Other Ambulatory Visit: Payer: Self-pay

## 2018-10-19 ENCOUNTER — Encounter: Payer: Self-pay | Admitting: Pulmonary Disease

## 2018-10-19 VITALS — BP 148/80 | HR 109 | Temp 97.9°F | Ht 66.0 in | Wt 110.0 lb

## 2018-10-19 DIAGNOSIS — J9611 Chronic respiratory failure with hypoxia: Secondary | ICD-10-CM

## 2018-10-19 DIAGNOSIS — C61 Malignant neoplasm of prostate: Secondary | ICD-10-CM | POA: Diagnosis not present

## 2018-10-19 DIAGNOSIS — R911 Solitary pulmonary nodule: Secondary | ICD-10-CM

## 2018-10-19 DIAGNOSIS — J449 Chronic obstructive pulmonary disease, unspecified: Secondary | ICD-10-CM

## 2018-10-19 DIAGNOSIS — F17210 Nicotine dependence, cigarettes, uncomplicated: Secondary | ICD-10-CM

## 2018-10-19 HISTORY — PX: TRIGGER FINGER RELEASE: SHX641

## 2018-10-19 MED ORDER — IPRATROPIUM-ALBUTEROL 0.5-2.5 (3) MG/3ML IN SOLN: 3.0000 mL | Freq: Four times a day (QID) | RESPIRATORY_TRACT | 11 refills | Status: DC

## 2018-10-19 MED ORDER — IPRATROPIUM-ALBUTEROL 0.5-2.5 (3) MG/3ML IN SOLN: 3.0000 mL | Freq: Four times a day (QID) | RESPIRATORY_TRACT | 11 refills | Status: DC | PRN

## 2018-10-19 NOTE — Patient Instructions (Signed)
1.  We will switch your medications to DuoNeb 1 vial 4 times a day (you may use up to 5 times a day if you have increasing shortness of breath)  2.  Combivent is to be used only if you do not have access to your nebulizer.  DuoNeb and Combivent are the same medication  3.  Do not use Stiolto  4.  You continue to qualify for oxygen  5.  We will see you in follow-up in 2 months time, call sooner should any new difficulties arise

## 2018-10-19 NOTE — Progress Notes (Signed)
Subjective:    Patient ID: Kevin Nelson, male    DOB: 1950/03/22, 68 y.o.   MRN: [id number]  HPI Mr. Khosla is a 68 year old current smoker (5 to 6 cigarettes/day) who has known very severe COPD last FEV1 of record 0.80 L or 25% predicted, FVC 3.01 L or 56% predicted with an FEV1/FVC of 35%.  He also has severe air trapping noted with an RV of 260% of predicted.  Patient had followed up with Dr. Alva Garnet and had seen him in January 2020 and July 2020.  Patient follows up after hospitalization from September 11-13 for COPD exacerbation.  Patient also requires requalification for oxygen use.  The patient has been oxygen dependent since 2018 due to chronic hypoxic respiratory failure.  Since his discharge the patient has been doing about his baseline.  He has significant dyspnea but manages to take care of his own activities of daily living.  He also continues to work on [employer] as much as he can.  He is supposed to be on Stiolto and as needed albuterol however he notes that he prefers to use his Combivent and albuterol nebulizer.  He does endorse that he has difficulty emptying air out completely and this makes him short of breath.  He has not had any sputum production no hemoptysis.  Only other complaints are residual side effects from Lupron which he was taking for prostate cancer but now has completed this therapy.  He voices no other complaint today.  CODE STATUS is DNR.  Review of Systems  Constitutional: Negative for activity change, appetite change, chills and fever.  HENT: Positive for dental problem (Wears dentures).   Eyes: Negative.   Respiratory: Positive for chest tightness and shortness of breath.   Cardiovascular: Negative.   Gastrointestinal: Negative.   Skin: Negative.   Neurological: Negative.   All other systems reviewed and are negative.      Objective:   Physical Exam Vitals signs and nursing note reviewed.  Constitutional:      General: He is not in acute  distress.    Appearance: He is cachectic.  HENT:     Head: Normocephalic and atraumatic.     Right Ear: External ear normal.     Left Ear: External ear normal.     Mouth/Throat:     Mouth: Mucous membranes are moist.     Comments: Wears dentures Eyes:     General: No scleral icterus.    Conjunctiva/sclera: Conjunctivae normal.     Pupils: Pupils are equal, round, and reactive to light.  Neck:     Musculoskeletal: Neck supple.     Thyroid: No thyromegaly.     Trachea: Trachea and phonation normal.  Cardiovascular:     Rate and Rhythm: Regular rhythm. Tachycardia present.     Pulses: Normal pulses.     Heart sounds: Normal heart sounds. No murmur.  Pulmonary:     Effort: Accessory muscle usage present.     Breath sounds: Decreased air movement present. Decreased breath sounds (Diffuse distant breath sounds) present. No wheezing, rhonchi or rales.  Abdominal:     General: Abdomen is flat. There is no distension.  Musculoskeletal: Normal range of motion.     Right lower leg: No edema.     Left lower leg: No edema.  Skin:    General: Skin is warm and dry.     Comments: Multiple ecchymoses particularly in upper extremities.  Neurological:     General: No focal deficit present.  Mental Status: He is alert and oriented to person, place, and time.  Psychiatric:        Mood and Affect: Mood normal.        Behavior: Behavior normal.           Assessment & Plan:   1.  Very severe COPD: He has difficulties using dry powder and metered-dose inhalers due to inability to breath-hold, will switch to DuoNeb 4 times a day with extra dose if needed during the day.  Up to no more than 5 times a day.  He may continue using Combivent when nebulizer is not available.  2.  Chronic hypoxic respiratory failure: Continue oxygen at 2 L/min, this helps him maintain above 90% with ambulation.  3.  Metastatic prostate cancer: This issue adds complexity to his management, he has completed Lupron  therapy.  4.  Tobacco dependence due to cigarettes: Continues to smoke 5 to 6 cigarettes/day but states that he cannot quit this presently due to "issues" he is going through.  He is however there of the need to curtail Bacot use and has been pre-contemplative with regards to quitting completely.  He was counseled for 3 to 5 minutes with regards to discontinuation of smoking.  5.  Left lower lobe lung nodule: Stable, no need for reimaging for a year that would be June 2021.  However, do note that this patient is a very poor candidate for any invasive procedure due to severe end-stage COPD.   We will see the patient in follow-up in 2 months time.  He is to contact us prior to that time should any new difficulties arise.  This chart was dictated using voice recognition software/Dragon.  Despite best efforts to proofread, errors can occur which can change the meaning.  Any change was purely unintentional.

## 2018-10-20 ENCOUNTER — Telehealth: Payer: Self-pay | Admitting: Pulmonary Disease

## 2018-10-20 MED ORDER — COMBIVENT RESPIMAT 20-100 MCG/ACT IN AERS: 1.0000 | INHALATION_SPRAY | Freq: Four times a day (QID) | RESPIRATORY_TRACT | 1 refills | Status: DC | PRN

## 2018-10-20 NOTE — Telephone Encounter (Signed)
Called and spoke to [place] with [employer], who stated that duoneb is in fact covered. Pt has been made aware of this information and voiced his understanding.  Nothing further is needed.

## 2018-10-24 ENCOUNTER — Ambulatory Visit: Payer: Self-pay | Admitting: Pharmacist

## 2018-10-24 ENCOUNTER — Other Ambulatory Visit: Payer: Medicare Other

## 2018-10-24 ENCOUNTER — Other Ambulatory Visit: Payer: Self-pay | Admitting: Nurse Practitioner

## 2018-10-24 ENCOUNTER — Other Ambulatory Visit: Payer: Self-pay

## 2018-10-24 DIAGNOSIS — J441 Chronic obstructive pulmonary disease with (acute) exacerbation: Secondary | ICD-10-CM

## 2018-10-24 DIAGNOSIS — R768 Other specified abnormal immunological findings in serum: Secondary | ICD-10-CM

## 2018-10-24 LAB — CBC WITH DIFFERENTIAL/PLATELET
Hematocrit: 41 % (ref 37.5–51.0)
Hemoglobin: 13.5 g/dL (ref 13.0–17.7)
Lymphocytes Absolute: 1.4 10*3/uL (ref 0.7–3.1)
Lymphs: 22 %
MCH: 29.3 pg (ref 26.6–33.0)
MCHC: 32.9 g/dL (ref 31.5–35.7)
MCV: 89 fL (ref 79–97)
MID (Absolute): 0.6 10*3/uL (ref 0.1–1.6)
MID: 9 %
Neutrophils Absolute: 4.4 10*3/uL (ref 1.4–7.0)
Neutrophils: 69 %
Platelets: 364 10*3/uL (ref 150–450)
RBC: 4.61 x10E6/uL (ref 4.14–5.80)
RDW: 13.6 % (ref 11.6–15.4)
WBC: 6.4 10*3/uL (ref 3.4–10.8)

## 2018-10-24 NOTE — Patient Instructions (Signed)
Visit Information  Goals Addressed            This Visit's Progress     Patient Stated   . PharmD "I'm worried about my Hepatitis" (pt-stated)       Current Barriers:  [name] Knowledge Deficits related to plan for treatment : patient completed 8 weeks of treatment w/ Mayvret for Hep C (June-July 2020). Today, he asks if he was supposed to take this for longer, and what the follow up plan is  Pharmacist Clinical Goal(s):  [name] Over the next 30 days, patient will work with CCM team to address needs related to optimized medication management  Interventions:  Contacted patient. Noted that Mayvret is generally an 8 week course.   Contacted [facility]; left voicemail asking for a return call regarding the intended duration of therapy and long term follow up plans. Asked for the patient to be called back on this.   Patient Self Care Activities:  . Self administers medications as prescribed . Attends all scheduled provider appointments . Calls provider office for new concerns or questions  Please see past updates related to this goal by clicking on the "Past Updates" button in the selected goal         The patient verbalized understanding of instructions provided today and declined a print copy of patient instruction materials.   Plan:  - Will f/u with patient later this week regarding plan of care  [name], PharmD Clinical Pharmacist [place] [phone]

## 2018-10-24 NOTE — Chronic Care Management (AMB) (Signed)
  Chronic Care Management   Follow Up Note   10/24/2018 Name: Khole Canizares MRN: [id number] DOB: 1950-08-22  Referred by: [name], NP Reason for referral : Chronic Care Management (Medication Management)   Taveion Baade is a 68 y.o. year old male who is a primary care patient of Cannady, [name], NP. The CCM team was consulted for assistance with chronic disease management and care coordination needs.    Care coordination completed today.   Review of patient status, including review of consultants reports, relevant laboratory and other test results, and collaboration with appropriate care team members and the patient's provider was performed as part of comprehensive patient evaluation and provision of chronic care management services.    SDOH (Social Determinants of Health) screening performed today: Physical Activity None. See Care Plan for related entries.   Advanced Directives Status: N See Care Plan and Vynca application for related entries.  Outpatient Encounter Medications as of 10/24/2018  Medication Sig  . clonazePAM (KLONOPIN) 0.5 MG tablet TAKE 1 TABLET BY MOUTH TWICE DAILY AS NEEDED FOR ANXIETY (Patient taking differently: Take 0.5 mg by mouth 2 (two) times daily. )  . Ipratropium-Albuterol (COMBIVENT RESPIMAT) 20-100 MCG/ACT AERS respimat Inhale 1 puff into the lungs every 6 (six) hours as needed.  [name] ipratropium-albuterol (DUONEB) 0.5-2.5 (3) MG/3ML SOLN Take 3 mLs by nebulization every 6 (six) hours as needed.   No facility-administered encounter medications on file as of 10/24/2018.      Goals Addressed            This Visit's Progress     Patient Stated   . PharmD "I'm worried about my Hepatitis" (pt-stated)       Current Barriers:  [name] Knowledge Deficits related to plan for treatment : patient completed 8 weeks of treatment w/ Mayvret for Hep C (June-July 2020). Today, he asks if he was supposed to take this for longer, and what the follow up plan is   Pharmacist Clinical Goal(s):  [name] Over the next 30 days, patient will work with CCM team to address needs related to optimized medication management  Interventions:  Contacted patient. Noted that Mayvret is generally an 8 week course.   Contacted [facility]; left voicemail asking for a return call regarding the intended duration of therapy and long term follow up plans. Asked for the patient to be called back on this.   Patient Self Care Activities:  . Self administers medications as prescribed . Attends all scheduled provider appointments . Calls provider office for new concerns or questions  Please see past updates related to this goal by clicking on the "Past Updates" button in the selected goal          Plan:  - Will f/u with patient later this week regarding plan of care  [name], PharmD Clinical Pharmacist [place] [phone]

## 2018-10-25 ENCOUNTER — Telehealth: Payer: Self-pay | Admitting: Gastroenterology

## 2018-10-25 ENCOUNTER — Other Ambulatory Visit: Payer: Self-pay

## 2018-10-25 DIAGNOSIS — R768 Other specified abnormal immunological findings in serum: Secondary | ICD-10-CM

## 2018-10-25 LAB — COMPREHENSIVE METABOLIC PANEL
ALT: 16 IU/L (ref 0–44)
AST: 31 IU/L (ref 0–40)
Albumin/Globulin Ratio: 1.9 (ref 1.2–2.2)
Albumin: 4.4 g/dL (ref 3.8–4.8)
Alkaline Phosphatase: 75 IU/L (ref 39–117)
BUN/Creatinine Ratio: 20 (ref 10–24)
BUN: 10 mg/dL (ref 8–27)
Bilirubin Total: 0.4 mg/dL (ref 0.0–1.2)
CO2: 25 mmol/L (ref 20–29)
Calcium: 9.6 mg/dL (ref 8.6–10.2)
Chloride: 90 mmol/L — ABNORMAL LOW (ref 96–106)
Creatinine, Ser: 0.51 mg/dL — ABNORMAL LOW (ref 0.76–1.27)
GFR calc Af Amer: 129 mL/min/{1.73_m2} (ref >59)
GFR calc non Af Amer: 112 mL/min/{1.73_m2} (ref >59)
Globulin, Total: 2.3 g/dL (ref 1.5–4.5)
Glucose: 81 mg/dL (ref 65–99)
Potassium: 4.9 mmol/L (ref 3.5–5.2)
Sodium: 137 mmol/L (ref 134–144)
Total Protein: 6.7 g/dL (ref 6.0–8.5)

## 2018-10-25 NOTE — Telephone Encounter (Signed)
Left vm for Katie that I did reach out to pt this morning. He said he did complete all 8 weeks of treatment. I advised Mr. Batterton that we did inform him that once he completed his treatment, he is to contact our office to schedule a follow up appt.   He also asked if we were going to recheck his labs. I told him we will recheck his viral load. He stated he was a very hard stick and the lab had to stick him multiple times before they were able to get any blood. He asked if we could use the blood from Dr. Rance Muir office. I told him I would reach out to them to see if they can do add on testing but I wasn't sure if it would be possible.

## 2018-10-25 NOTE — Telephone Encounter (Signed)
Terie Purser left vm she is the pharmacist at [facility] practice pt was seen today to have Labs done he ask her about rx [place]  Pt only did 1 weeks treatment  Then did not hear anything does he need to f/u with [place] or finish treatment please call pt to clarify

## 2018-10-27 ENCOUNTER — Ambulatory Visit: Payer: Medicare Other | Admitting: Pharmacist

## 2018-10-27 DIAGNOSIS — R768 Other specified abnormal immunological findings in serum: Secondary | ICD-10-CM

## 2018-10-27 NOTE — Patient Instructions (Signed)
Visit Information  Goals Addressed            This Visit's Progress     Patient Stated   . PharmD "I'm worried about my Hepatitis" (pt-stated)       Current Barriers:  [name] Knowledge Deficits related to plan for treatment : patient completed 8 weeks of treatment w/ Mayvret for Hep C (June-July 2020). He was unsure what his follow up plan was . Collaborated w/ Ginger Feldpauch (Dr. Allen Norris); he is scheduled for a f/u appointment w/ Dr. Allen Norris in November and will need a HCV RNA drawn. As patient was a difficult stick with his last lab work, collaborated w/ Ginger and [employer], and this lab will be added on to anything he will need at his next appointment w/ [name].  Pharmacist Clinical Goal(s):  [name] Over the next 30 days, patient will work with CCM team to address needs related to optimized medication management  Interventions:  Contacted patient. He is appreciative of the help in coordinating his follow up for Hep C. Is is still very concerned about making sure this has been taken care of. Provided reassurance that 8 weeks of treatment w/ Mayvret generally is effective.   Reminded of upcoming appointments w/ [name] Cannady and Dr. Allen Norris.  Patient Self Care Activities:  . Self administers medications as prescribed . Attends all scheduled provider appointments . Calls provider office for new concerns or questions  Please see past updates related to this goal by clicking on the "Past Updates" button in the selected goal         The patient verbalized understanding of instructions provided today and declined a print copy of patient instruction materials.   Plan:  - Will outreach patient in the next 5-6 weeks for continued medication management support  [name], PharmD Clinical Pharmacist [place] [phone]

## 2018-10-27 NOTE — Chronic Care Management (AMB) (Signed)
  Chronic Care Management   Follow Up Note   10/27/2018 Name: Kevin Nelson MRN: [id number] DOB: [birth year]  Referred by: [name], NP Reason for referral : Chronic Care Management (Medication Management)   Kevin Nelson is a 68 y.o. year old male who is a primary care patient of Cannady, [name], NP. The CCM team was consulted for assistance with chronic disease management and care coordination needs.    Contacted patient for medication management review.   Review of patient status, including review of consultants reports, relevant laboratory and other test results, and collaboration with appropriate care team members and the patient's provider was performed as part of comprehensive patient evaluation and provision of chronic care management services.    SDOH (Social Determinants of Health) screening performed today: None. See Care Plan for related entries.   Advanced Directives Status: N See Care Plan and Vynca application for related entries.  Outpatient Encounter Medications as of 10/27/2018  Medication Sig  . clonazePAM (KLONOPIN) 0.5 MG tablet TAKE 1 TABLET BY MOUTH TWICE DAILY AS NEEDED FOR ANXIETY (Patient taking differently: Take 0.5 mg by mouth 2 (two) times daily. )  . Ipratropium-Albuterol (COMBIVENT RESPIMAT) 20-100 MCG/ACT AERS respimat Inhale 1 puff into the lungs every 6 (six) hours as needed.  Kevin Nelson ipratropium-albuterol (DUONEB) 0.5-2.5 (3) MG/3ML SOLN Take 3 mLs by nebulization every 6 (six) hours as needed.   No facility-administered encounter medications on file as of 10/27/2018.      Goals Addressed            This Visit's Progress     Patient Stated   . PharmD "I'm worried about my Hepatitis" (pt-stated)       Current Barriers:  Kevin Nelson Knowledge Deficits related to plan for treatment : patient completed 8 weeks of treatment w/ Mayvret for Hep C (June-July 2020). He was unsure what his follow up plan was . Collaborated w/ Ginger Feldpauch (Dr. Allen Norris); he is  scheduled for a f/u appointment w/ Dr. Allen Norris in November and will need a HCV RNA drawn. As patient was a difficult stick with his last lab work, collaborated w/ [facility]inger and [employer], and this lab will be added on to anything he will need at his next appointment w/ [name].  Pharmacist Clinical Goal(s):  Kevin Nelson Over the next 30 days, patient will work with CCM team to address needs related to optimized medication management  Interventions:  Contacted patient. He is appreciative of the help in coordinating his follow up for Hep C. Is is still very concerned about making sure this has been taken care of. Provided reassurance that 8 weeks of treatment w/ Mayvret generally is effective.   Reminded of upcoming appointments w/ [name] Cannady and Dr. Allen Norris.  Patient Self Care Activities:  . Self administers medications as prescribed . Attends all scheduled provider appointments . Calls provider office for new concerns or questions  Please see past updates related to this goal by clicking on the "Past Updates" button in the selected goal          Plan:  - Will outreach patient in the next 5-6 weeks for continued medication management support  [name], PharmD Clinical Pharmacist [place] [phone]

## 2018-10-31 ENCOUNTER — Ambulatory Visit: Payer: Medicare Other | Admitting: Nurse Practitioner

## 2018-11-10 ENCOUNTER — Other Ambulatory Visit: Payer: Self-pay

## 2018-11-10 ENCOUNTER — Encounter: Payer: Self-pay | Admitting: Nurse Practitioner

## 2018-11-10 ENCOUNTER — Ambulatory Visit (INDEPENDENT_AMBULATORY_CARE_PROVIDER_SITE_OTHER): Payer: Medicare Other | Admitting: Pharmacist

## 2018-11-10 ENCOUNTER — Ambulatory Visit (INDEPENDENT_AMBULATORY_CARE_PROVIDER_SITE_OTHER): Payer: Medicare Other | Admitting: Nurse Practitioner

## 2018-11-10 ENCOUNTER — Ambulatory Visit: Payer: Medicare Other | Admitting: *Deleted

## 2018-11-10 VITALS — BP 109/73 | HR 95 | Temp 98.8°F

## 2018-11-10 DIAGNOSIS — R768 Other specified abnormal immunological findings in serum: Secondary | ICD-10-CM | POA: Diagnosis not present

## 2018-11-10 DIAGNOSIS — M19041 Primary osteoarthritis, right hand: Secondary | ICD-10-CM | POA: Diagnosis not present

## 2018-11-10 DIAGNOSIS — F17210 Nicotine dependence, cigarettes, uncomplicated: Secondary | ICD-10-CM

## 2018-11-10 DIAGNOSIS — J449 Chronic obstructive pulmonary disease, unspecified: Secondary | ICD-10-CM

## 2018-11-10 DIAGNOSIS — M19042 Primary osteoarthritis, left hand: Secondary | ICD-10-CM | POA: Diagnosis not present

## 2018-11-10 DIAGNOSIS — G8929 Other chronic pain: Secondary | ICD-10-CM

## 2018-11-10 DIAGNOSIS — J441 Chronic obstructive pulmonary disease with (acute) exacerbation: Secondary | ICD-10-CM | POA: Diagnosis not present

## 2018-11-10 DIAGNOSIS — R519 Headache, unspecified: Secondary | ICD-10-CM

## 2018-11-10 DIAGNOSIS — C61 Malignant neoplasm of prostate: Secondary | ICD-10-CM

## 2018-11-10 DIAGNOSIS — J430 Unilateral pulmonary emphysema [MacLeod's syndrome]: Secondary | ICD-10-CM

## 2018-11-10 NOTE — Assessment & Plan Note (Signed)
I have recommended complete cessation of tobacco use. I have discussed various options available for assistance with tobacco cessation including over the counter methods (Nicotine gum, patch and lozenges). We also discussed prescription options (Chantix, Nicotine Inhaler / Nasal Spray). The patient is not interested in pursuing any prescription tobacco cessation options at this time.  

## 2018-11-10 NOTE — Assessment & Plan Note (Signed)
Chronic, ongoing with O2 dependence.  Continue current medication regimen and collaboration with pulmonology.  Recommend complete smoking cessation.   

## 2018-11-10 NOTE — Chronic Care Management (AMB) (Signed)
  Chronic Care Management   Follow Up Note   11/10/2018 Name: Kevin Nelson MRN: [id number] DOB: [birth year]  Referred by: [name], NP Reason for referral : Chronic Care Management (Medication Management)   Kevin Nelson is a 68 y.o. year old male who is a primary care patient of Cannady, [name], NP. The CCM team was consulted for assistance with chronic disease management and care coordination needs.    Met with patient face to face for medication review with Janci Minor, RN with PCP appointment.   Review of patient status, including review of consultants reports, relevant laboratory and other test results, and collaboration with appropriate care team members and the patient's provider was performed as part of comprehensive patient evaluation and provision of chronic care management services.    SDOH (Social Determinants of Health) screening performed today: Tobacco Use. See Care Plan for related entries.   Advanced Directives Status: N See Care Plan and Vynca application for related entries.  Outpatient Encounter Medications as of 11/10/2018  Medication Sig  . Ipratropium-Albuterol (COMBIVENT RESPIMAT) 20-100 MCG/ACT AERS respimat Inhale 1 puff into the lungs every 6 (six) hours as needed.  [name] ipratropium-albuterol (DUONEB) 0.5-2.5 (3) MG/3ML SOLN Take 3 mLs by nebulization every 6 (six) hours as needed.  . clonazePAM (KLONOPIN) 0.5 MG tablet TAKE 1 TABLET BY MOUTH TWICE DAILY AS NEEDED FOR ANXIETY (Patient taking differently: Take 0.5 mg by mouth 2 (two) times daily. )  . [DISCONTINUED] STIOLTO RESPIMAT 2.5-2.5 MCG/ACT AERS INL 2 PFS ITL D   No facility-administered encounter medications on file as of 11/10/2018.      Goals Addressed            This Visit's Progress     Patient Stated   . Pharm D "I want to take care of myself" (pt-stated)       Current Barriers:  . Polypharmacy; complex patient with multiple comorbidities including COPD, tobacco abuse, prostate  cancer, s/p tx for Hep C . Self-manages medications.  o Prostate CA: s/p Lupron treatment; greatest concern today is regarding long term effects of ADT; notes continued decreased sexual function. Notes he is in a new relationship with a friend from childhood o COPD/tobacco abuse: Duoneb or Combivent as needed; notes he continues to smoke. Follows w/ Dr. Camillo Flaming pulmonary o S/p tx Hep C: s/p Mayvret. Due for viral load recheck.  o Headaches: reports headaches every morning for the past ~2 years. Feels it started around the same time as Lupron injection, however, has continued, even though he finished therapy a few months ago. Unsure if he snores at night, but notes that he wakes up with a sore throat from mouth breathing. Takes ibuprofen for the pain, resolves with this.  o Trigger finger: surgery for planned next Wednesday.   Pharmacist Clinical Goal(s):  [name] Over the next 90 days, patient will work with PharmD and provider towards optimized medication management  Interventions: . Comprehensive medication review performed; medication list updated in electronic medical record . Discussed that ibuprofen is safer option for him than APAP given liver disease . Discussed morning headaches. Discussed sleep study. Ordered today . Checking viral load today.   Patient Self Care Activities:  . Patient will take medications as prescribed  Initial goal documentation          Plan:  - Will outreach patient in ~5-6 weeks for continued medication management support  [name], PharmD Clinical Pharmacist [place] [phone]

## 2018-11-10 NOTE — Patient Instructions (Signed)
Visit Information  Goals Addressed            This Visit's Progress     Patient Stated   . Pharm D "I want to take care of myself" (pt-stated)       Current Barriers:  . Polypharmacy; complex patient with multiple comorbidities including COPD, tobacco abuse, prostate cancer, s/p tx for Hep C . Self-manages medications.  o Prostate CA: s/p Lupron treatment; greatest concern today is regarding long term effects of ADT; notes continued decreased sexual function. Notes he is in a new relationship with a friend from childhood o COPD/tobacco abuse: Duoneb or Combivent as needed; notes he continues to smoke. Follows w/ Dr. Camillo Flaming pulmonary o S/p tx Hep C: s/p Mayvret. Due for viral load recheck.  o Headaches: reports headaches every morning for the past ~2 years. Feels it started around the same time as Lupron injection, however, has continued, even though he finished therapy a few months ago. Unsure if he snores at night, but notes that he wakes up with a sore throat from mouth breathing. Takes ibuprofen for the pain, resolves with this.  o Trigger finger: surgery for planned next Wednesday.   Pharmacist Clinical Goal(s):  [name] Over the next 90 days, patient will work with PharmD and provider towards optimized medication management  Interventions: . Comprehensive medication review performed; medication list updated in electronic medical record . Discussed that ibuprofen is safer option for him than APAP given liver disease . Discussed morning headaches. Discussed sleep study. Ordered today . Checking viral load today.   Patient Self Care Activities:  . Patient will take medications as prescribed  Initial goal documentation         The patient verbalized understanding of instructions provided today and declined a print copy of patient instruction materials.   Plan:  - Will outreach patient in ~5-6 weeks for continued medication management support  [name], PharmD Clinical  Pharmacist [place] [phone]

## 2018-11-10 NOTE — Assessment & Plan Note (Signed)
Suspect related to mouth breathing with his COPD, as wears [place] for O2.  Will place referral for sleep study to assess what patient does at night that may be leading to morning headaches chronically for past 2 years.  Continue Ibuprofen as needed at home.

## 2018-11-10 NOTE — Progress Notes (Signed)
BP 109/73   Pulse 95   Temp 98.8 F (37.1 C) (Oral)   SpO2 91%    Subjective:    Patient ID: Kevin Nelson, male    DOB: [birth year], 68 y.o.   MRN: [id number]  HPI: Kevin Nelson is a 68 y.o. male  Chief Complaint  Patient presents with  . COPD  . Headache    pt states he has been having daily headaches since starting prostate treatment 2 years ago    COPD Last saw Dr. Patsey Berthold last 10/19/2018. He continues to smoke daily, but has a plan to quit along with his partner.  COPD status: stable Satisfied with current treatment?: yes Oxygen use: yes, 2L a day Dyspnea frequency: occasional Cough frequency: chronic, improved at this time Rescue inhaler frequency:  Once a day Limitation of activity: no Productive cough: improved Last Spirometry: 02/14/18 --- FVC: 2.28 L (56 %pred), FEV1: 0.80 L (25 %pred), FEV1/FVC: 35% Pneumovax: Not up to Date, refuses today Influenza: Not up to Date, refuses today  HEADACHES: Has had every morning for 2 1/2 years since starting Lupron shots for prostate CA.  Does report he takes his Klonopin 1 1/2 hours before bed, which helps him sleep.  Is not sure if he snores.  Does have underlying COPD and wears O2 consistently, even at night.  He is a mouth breather and wears nasal cannula.  Takes Ibuprofen, which does help.   Wakes feeling refreshed:  no Daytime hypersomnolence:  no Fatigue:  no Insomnia:  no Good sleep hygiene:  yes Difficulty falling asleep:  yes Difficulty staying asleep:  no Snoring bothers bed partner:  no Observed apnea by bed partner: no Obesity:  no Hypertension: no  Pulmonary hypertension:  no Coronary artery disease:  no  Relevant past medical, surgical, family and social history reviewed and updated as indicated. Interim medical history since our last visit reviewed. Allergies and medications reviewed and updated.  Review of Systems  Constitutional: Negative for activity change, diaphoresis, fatigue and fever.   Respiratory: Negative for cough, chest tightness, shortness of breath and wheezing.   Cardiovascular: Negative for chest pain, palpitations and leg swelling.  Gastrointestinal: Negative for abdominal distention, abdominal pain, constipation, diarrhea, nausea and vomiting.  Skin: Negative.   Neurological: Negative for dizziness, syncope, weakness, light-headedness, numbness and headaches.  Psychiatric/Behavioral: Negative.    Per HPI unless specifically indicated above     Objective:    BP 109/73   Pulse 95   Temp 98.8 F (37.1 C) (Oral)   SpO2 91%   Wt Readings from Last 3 Encounters:  10/19/18 110 lb (49.9 kg)  10/09/18 104 lb (47.2 kg)  09/29/18 112 lb (50.8 kg)    Physical Exam Vitals signs and nursing note reviewed.  Constitutional:      General: He is awake. He is not in acute distress.    Appearance: He is well-developed and underweight. He is not ill-appearing.  HENT:     Head: Normocephalic and atraumatic.     Right Ear: Hearing normal. No drainage.     Left Ear: Hearing normal. No drainage.  Eyes:     General: Lids are normal.        Right eye: No discharge.        Left eye: No discharge.     Conjunctiva/sclera: Conjunctivae normal.     Pupils: Pupils are equal, round, and reactive to light.  Neck:     Musculoskeletal: Normal range of motion and neck supple.  Vascular: No carotid bruit.  Cardiovascular:     Rate and Rhythm: Normal rate and regular rhythm.     Heart sounds: Normal heart sounds, S1 normal and S2 normal. No murmur. No gallop.   Pulmonary:     Effort: Pulmonary effort is normal. No accessory muscle usage or respiratory distress.     Breath sounds: Decreased breath sounds present.     Comments: Wearing O2 via [place] at 2 L.  No SOB with talking.  Intermittent nonproductive cough.   Abdominal:     General: Bowel sounds are normal.     Palpations: Abdomen is soft.  Musculoskeletal: Normal range of motion.     Right lower leg: No edema.     Left  lower leg: No edema.  Skin:    General: Skin is warm and dry.     Capillary Refill: Capillary refill takes less than 2 seconds.     Findings: No rash.  Neurological:     Mental Status: He is alert and oriented to person, place, and time.     Deep Tendon Reflexes: Reflexes are normal and symmetric.  Psychiatric:        Mood and Affect: Mood normal.        Behavior: Behavior normal. Behavior is cooperative.        Thought Content: Thought content normal.        Judgment: Judgment normal.     Results for orders placed or performed in visit on 10/24/18  Comprehensive metabolic panel  Result Value Ref Range   Glucose 81 65 - 99 mg/dL   BUN 10 8 - 27 mg/dL   Creatinine, Ser 0.51 (L) 0.76 - 1.27 mg/dL   GFR calc non Af Amer 112 >59 mL/min/1.73   GFR calc Af Amer 129 >59 mL/min/1.73   BUN/Creatinine Ratio 20 10 - 24   Sodium 137 134 - 144 mmol/L   Potassium 4.9 3.5 - 5.2 mmol/L   Chloride 90 (L) 96 - 106 mmol/L   CO2 25 20 - 29 mmol/L   Calcium 9.6 8.6 - 10.2 mg/dL   Total Protein 6.7 6.0 - 8.5 g/dL   Albumin 4.4 3.8 - 4.8 g/dL   Globulin, Total 2.3 1.5 - 4.5 g/dL   Albumin/Globulin Ratio 1.9 1.2 - 2.2   Bilirubin Total 0.4 0.0 - 1.2 mg/dL   Alkaline Phosphatase 75 39 - 117 IU/L   AST 31 0 - 40 IU/L   ALT 16 0 - 44 IU/L  CBC With Differential/Platelet  Result Value Ref Range   WBC 6.4 3.4 - 10.8 x10E3/uL   RBC 4.61 4.14 - 5.80 x10E6/uL   Hemoglobin 13.5 13.0 - 17.7 g/dL   Hematocrit 41.0 37.5 - 51.0 %   MCV 89 79 - 97 fL   MCH 29.3 26.6 - 33.0 pg   MCHC 32.9 31.5 - 35.7 g/dL   RDW 13.6 11.6 - 15.4 %   Platelets 364 150 - 450 x10E3/uL   Neutrophils 69 Not Estab. %   Lymphs 22 Not Estab. %   MID 9 Not Estab. %   Neutrophils Absolute 4.4 1.4 - 7.0 x10E3/uL   Lymphocytes Absolute 1.4 0.7 - 3.1 x10E3/uL   MID (Absolute) 0.6 0.1 - 1.6 X10E3/uL      Assessment & Plan:   Problem List Items Addressed This Visit      Respiratory   COPD, very severe (HCC) - Primary     Chronic, ongoing with O2 dependence.  Continue current medication regimen and  collaboration with pulmonology.  Recommend complete smoking cessation.          Other   Nicotine dependence, cigarettes, uncomplicated    I have recommended complete cessation of tobacco use. I have discussed various options available for assistance with tobacco cessation including over the counter methods (Nicotine gum, patch and lozenges). We also discussed prescription options (Chantix, Nicotine Inhaler / Nasal Spray). The patient is not interested in pursuing any prescription tobacco cessation options at this time.      Chronic nonintractable headache    Suspect related to mouth breathing with his COPD, as wears [place] for O2.  Will place referral for sleep study to assess what patient does at night that may be leading to morning headaches chronically for past 2 years.  Continue Ibuprofen as needed at home.      Relevant Orders   Ambulatory referral to Sleep Studies       Follow up plan: Return in about 3 months (around 02/10/2019) for COPD, CAD, GAD.

## 2018-11-10 NOTE — Patient Instructions (Signed)
COPD and Physical Activity °Chronic obstructive pulmonary disease (COPD) is a long-term (chronic) condition that affects the lungs. COPD is a general term that can be used to describe many different lung problems that cause lung swelling (inflammation) and limit airflow, including chronic bronchitis and emphysema. °The main symptom of COPD is shortness of breath, which makes it harder to do even simple tasks. This can also make it harder to exercise and be active. Talk with your health care provider about treatments to help you breathe better and actions you can take to prevent breathing problems during physical activity. °What are the benefits of exercising with COPD? °Exercising regularly is an important part of a healthy lifestyle. You can still exercise and do physical activities even though you have COPD. Exercise and physical activity improve your shortness of breath by increasing blood flow (circulation). This causes your heart to pump more oxygen through your body. Moderate exercise can improve your: °· Oxygen use. °· Energy level. °· Shortness of breath. °· Strength in your breathing muscles. °· Heart health. °· Sleep. °· Self-esteem and feelings of self-worth. °· Depression, stress, and anxiety levels. °Exercise can benefit everyone with COPD. The severity of your disease may affect how hard you can exercise, especially at first, but everyone can benefit. Talk with your health care provider about how much exercise is safe for you, and which activities and exercises are safe for you. °What actions can I take to prevent breathing problems during physical activity? °· Sign up for a pulmonary rehabilitation program. This type of program may include: °? Education about lung diseases. °? Exercise classes that teach you how to exercise and be more active while improving your breathing. This usually involves: °§ Exercise using your lower extremities, such as a stationary bicycle. °§ About 30 minutes of exercise, 2  to 5 times per week, for 6 to 12 weeks °§ Strength training, such as push ups or leg lifts. °? Nutrition education. °? Group classes in which you can talk with others who also have COPD and learn ways to manage stress. °· If you use an oxygen tank, you should use it while you exercise. Work with your health care provider to adjust your oxygen for your physical activity. Your resting flow rate is different from your flow rate during physical activity. °· While you are exercising: °? Take slow breaths. °? Pace yourself and do not try to go too fast. °? Purse your lips while breathing out. Pursing your lips is similar to a kissing or whistling position. °? If doing exercise that uses a quick burst of effort, such as weight lifting: °§ Breathe in before starting the exercise. °§ Breathe out during the hardest part of the exercise (such as raising the weights). °Where to find support °You can find support for exercising with COPD from: °· Your health care provider. °· A pulmonary rehabilitation program. °· Your local health department or community health programs. °· Support groups, online or in-person. Your health care provider may be able to recommend support groups. °Where to find more information °You can find more information about exercising with COPD from: °· American Lung Association: lung.org. °· COPD Foundation: copdfoundation.org. °Contact a health care provider if: °· Your symptoms get worse. °· You have chest pain. °· You have nausea. °· You have a fever. °· You have trouble talking or catching your breath. °· You want to start a new exercise program or a new activity. °Summary °· COPD is a general term that can   be used to describe many different lung problems that cause lung swelling (inflammation) and limit airflow. This includes chronic bronchitis and emphysema. °· Exercise and physical activity improve your shortness of breath by increasing blood flow (circulation). This causes your heart to provide more  oxygen to your body. °· Contact your health care provider before starting any exercise program or new activity. Ask your health care provider what exercises and activities are safe for you. °This information is not intended to replace advice given to you by your health care provider. Make sure you discuss any questions you have with your health care provider. °Document Released: 01/27/2017 Document Revised: 04/26/2018 Document Reviewed: 01/27/2017 °Elsevier Patient Education © 2020 [employer]. ° °

## 2018-11-11 LAB — HCV RNA QUANT: Hepatitis C Quantitation: NOT DETECTED IU/mL

## 2018-11-11 NOTE — Chronic Care Management (AMB) (Signed)
Chronic Care Management   Follow Up Note   11/11/2018 Name: Kevin Nelson MRN: [id number] DOB: Dec 09, 1950  Referred by: Kevin Lick, NP Reason for referral : Chronic Care Management (COPD, Hx Prostate CA)   Kevin Nelson is a 68 y.o. year old male who is a primary care patient of Cannady, [name], NP. The CCM team was consulted for assistance with chronic disease management and care coordination needs.    Review of patient status, including review of consultants reports, relevant laboratory and other test results, and collaboration with appropriate care team members and the patient's provider was performed as part of comprehensive patient evaluation and provision of chronic care management services.    SDOH (Social Determinants of Health) screening performed today: Tobacco Use. See Care Plan for related entries.   Advanced Directives Status: N See Care Plan and Vynca application for related entries.  Outpatient Encounter Medications as of 11/10/2018  Medication Sig  . clonazePAM (KLONOPIN) 0.5 MG tablet TAKE 1 TABLET BY MOUTH TWICE DAILY AS NEEDED FOR ANXIETY (Patient taking differently: Take 0.5 mg by mouth 2 (two) times daily. )  . Ipratropium-Albuterol (COMBIVENT RESPIMAT) 20-100 MCG/ACT AERS respimat Inhale 1 puff into the lungs every 6 (six) hours as needed.  [name] ipratropium-albuterol (DUONEB) 0.5-2.5 (3) MG/3ML SOLN Take 3 mLs by nebulization every 6 (six) hours as needed.   No facility-administered encounter medications on file as of 11/10/2018.      Goals Addressed            This Visit's Progress   . RN-COPD Management (pt-stated)       Current Barriers:  [name] Knowledge deficits related to basic COPD self care/management . Limited Social Support   Case Manager Clinical Goal(s):  Over the next 90 days, patient will be able to verbalize understanding of COPD action plan and when to seek appropriate levels of medical care  Over the next 90 days, patient will not be  hospitalized for COPD exacerbation   Interventions:  . Reviewed COPD action plan . Patient reports having all COPD medications . Reviewed upcoming appointment with Pulmonology 12/2 . Patient reporting he is doing well with combivent and duonebs . His main complaint is waking up with headaches which he states could be related to being a mouth breather and only having nasal canula 02. PCP discussed sleep study for patient- Referral placed for [facility] in [place].  [name] Continues to smoke, with no interest in quitting  . Continues to be on 02 . Patient stating he is having hand surgery coming up to address trigger finger.    Patient Self Care Activities:  Takes medications as prescribed including inhalers  Does not currently perform self care activities related to COPD management  Please see past updates related to this goal by clicking on the "Past Updates" button in the selected goal      . RN-I need to see my urologist (pt-stated)       Current Barriers:  [name] Knowledge Deficits related to obtaining an appointment with urology provider . Transportation barriers  Nurse Case Manager Clinical Goal(s):  [name] Over the next 30 days, patient will work with [facility] to address needs related to Urology appt and potential transportation barriers   Interventions:  . Advised patient to talk with his urologist about frustrated about lingering side effects of Lupron shots decreasing his libido and sexual function.  Nash Dimmer with PCP regarding patient's expressed frustrations over decreased libido and sexual function .  Discussed plans with patient for ongoing care management follow up and provided patient with direct contact information for care management team . Discussed medication options with CCM Pharm D- no good options related to patient's cardiac risk . Upcoming appointment with [facility] on 12/28.  Patient Self Care Activities:  . Currently UNABLE TO  independently manage urology appointments related to other health issues going on  . Performs ADL's independently . Performs IADL's independently  Please see past updates related to this goal by clicking on the "Past Updates" button in the selected goal          The care management team will reach out to the patient again over the next 30 days.  The patient has been provided with contact information for the care management team and has been advised to call with any health related questions or concerns.    SIGNATURE

## 2018-11-11 NOTE — Patient Instructions (Signed)
Thank you allowing the Chronic Care Management Team to be a part of your care! It was a pleasure speaking with you today!  CCM (Chronic Care Management) Team   Maelle Sheaffer RN, BSN Nurse Care Coordinator  [phone]  Catie Travis PharmD  Clinical Pharmacist  [phone]  Brooke Joyce LCSW Clinical Social Worker [phone]        

## 2018-11-14 ENCOUNTER — Telehealth: Payer: Self-pay

## 2018-11-14 NOTE — Telephone Encounter (Signed)
-----   Message from Lucilla Lame, MD sent at 11/13/2018  1:46 PM EDT ----- Let the patient know that his hepatitis C viral labs all was negative

## 2018-11-14 NOTE — Telephone Encounter (Signed)
Pt notified of lab results

## 2018-11-21 ENCOUNTER — Ambulatory Visit: Payer: Medicare Other | Admitting: Gastroenterology

## 2018-11-21 ENCOUNTER — Telehealth: Payer: Self-pay | Admitting: Nurse Practitioner

## 2018-11-21 ENCOUNTER — Other Ambulatory Visit: Payer: Self-pay | Admitting: Nurse Practitioner

## 2018-11-21 ENCOUNTER — Encounter: Payer: Self-pay | Admitting: Gastroenterology

## 2018-11-21 DIAGNOSIS — Z85828 Personal history of other malignant neoplasm of skin: Secondary | ICD-10-CM

## 2018-11-21 DIAGNOSIS — R768 Other specified abnormal immunological findings in serum: Secondary | ICD-10-CM

## 2018-11-21 MED ORDER — CLONAZEPAM 0.5 MG PO TABS: 0.5000 mg | ORAL_TABLET | Freq: Two times a day (BID) | ORAL | 2 refills | Status: DC | PRN

## 2018-11-21 NOTE — Telephone Encounter (Signed)
clonazePAM (KLONOPIN) 0.5 MG tablet  [facility]

## 2018-11-21 NOTE — Telephone Encounter (Signed)
Patient notified

## 2018-11-21 NOTE — Telephone Encounter (Signed)
Refill sent.

## 2018-11-21 NOTE — Telephone Encounter (Signed)
Copied from [place] [phone]. Topic: Referral - Question >> Nov 21, 2018  9:21 AM Parke Poisson wrote: Reason for CRM:We need to know what sleep study to order for pt,Thanks

## 2018-11-21 NOTE — Telephone Encounter (Signed)
Copied from [place] [phone]. Topic: General - Other >> Nov 21, 2018  3:02 PM Pauline Good wrote: Reason for CRM: pt need a new dermatology due to issues at the office

## 2018-11-21 NOTE — Telephone Encounter (Signed)
New dermatology referral placed

## 2018-11-21 NOTE — Telephone Encounter (Signed)
Called and spoke with patient. Message relayed. While on the phone, patient requested Rx refill on klonopin. Patient stated "Tell [name] thank you for me"

## 2018-11-21 NOTE — Telephone Encounter (Signed)
Requested medication (s) are due for refill today: yes  Requested medication (s) are on the active medication list: yes  Last refill:  08/23/2018  Future visit scheduled: no  Notes to clinic:  Refill cannot be delegated    Requested Prescriptions  Pending Prescriptions Disp Refills   clonazePAM (KLONOPIN) 0.5 MG tablet 60 tablet 2    Sig: Take 1 tablet (0.5 mg total) by mouth 2 (two) times daily as needed. for anxiety     Not Delegated - Psychiatry:  Anxiolytics/Hypnotics Failed - 11/21/2018  3:02 PM      Failed - This refill cannot be delegated      Failed - Urine Drug Screen completed in last 360 days.      Passed - Valid encounter within last 6 months    Recent Outpatient Visits          1 week ago COPD, very severe ([place])   [place], [name] T, NP   1 month ago COPD exacerbation ([place])   [place] Cannady, [name] T, NP   1 month ago COPD, very severe ([place])   [place], [name] T, NP   3 months ago COPD, very severe ([place])   [place], [name] T, NP   7 months ago COPD exacerbation ([place])   [place], [name] T, NP      Future Appointments            In 4 weeks Tyler Pita, MD [place]

## 2018-11-28 ENCOUNTER — Telehealth: Payer: Self-pay

## 2018-11-29 DIAGNOSIS — M65352 Trigger finger, left little finger: Secondary | ICD-10-CM | POA: Diagnosis not present

## 2018-12-01 DIAGNOSIS — M65352 Trigger finger, left little finger: Secondary | ICD-10-CM | POA: Diagnosis not present

## 2018-12-04 ENCOUNTER — Telehealth: Payer: Self-pay | Admitting: Nurse Practitioner

## 2018-12-04 NOTE — Telephone Encounter (Signed)
Copied from [place] [phone]. Topic: General - Other >> Dec 04, 2018 12:14 PM Ivar Drape wrote: Reason for CRM:  Patient wanted Frimy Uffelman's medical assistant to know that his Oral Surgeon would prefer the patient be sent to [facility] for treatment. Patient also wanted her to know he had surgery on his finger.

## 2018-12-05 NOTE — Telephone Encounter (Signed)
[place] Phone: [phone]

## 2018-12-05 NOTE — Telephone Encounter (Signed)
Can we find out who local dentist is and see if they are unable to refer to oral surgeon for this, if they can not place referrals then can we see who they wish referral to go to and the exact tooth and reason for referral so I can place on order.  Thanks.

## 2018-12-05 NOTE — Telephone Encounter (Signed)
Patient was told by local dentist that he needs to be referred to an oral surgeon in [facility] to have a lower left jaw tooth surgically removed, so that he can continue the process to get his dentures.

## 2018-12-06 ENCOUNTER — Other Ambulatory Visit: Payer: Self-pay | Admitting: Nurse Practitioner

## 2018-12-06 DIAGNOSIS — K029 Dental caries, unspecified: Secondary | ICD-10-CM

## 2018-12-06 NOTE — Telephone Encounter (Signed)
Patient needs to be referred to [facility] due to underlying  health issues, so that he will be in a hospital setting. He needs 21,22,23,26,27,28 removed.

## 2018-12-06 NOTE — Telephone Encounter (Signed)
Referral has been placed. 

## 2018-12-06 NOTE — Progress Notes (Signed)
Per patient dentist he needs a referral to [facility] for dental extractions.

## 2018-12-07 DIAGNOSIS — L578 Other skin changes due to chronic exposure to nonionizing radiation: Secondary | ICD-10-CM | POA: Diagnosis not present

## 2018-12-07 DIAGNOSIS — D485 Neoplasm of uncertain behavior of skin: Secondary | ICD-10-CM | POA: Diagnosis not present

## 2018-12-07 DIAGNOSIS — L570 Actinic keratosis: Secondary | ICD-10-CM | POA: Diagnosis not present

## 2018-12-19 DIAGNOSIS — J383 Other diseases of vocal cords: Secondary | ICD-10-CM

## 2018-12-19 HISTORY — DX: Other diseases of vocal cords: J38.3

## 2018-12-20 ENCOUNTER — Encounter: Payer: Self-pay | Admitting: Pulmonary Disease

## 2018-12-20 ENCOUNTER — Telehealth: Payer: Self-pay | Admitting: Nurse Practitioner

## 2018-12-20 ENCOUNTER — Ambulatory Visit (INDEPENDENT_AMBULATORY_CARE_PROVIDER_SITE_OTHER): Payer: Medicare Other | Admitting: Pulmonary Disease

## 2018-12-20 DIAGNOSIS — C61 Malignant neoplasm of prostate: Secondary | ICD-10-CM | POA: Diagnosis not present

## 2018-12-20 DIAGNOSIS — J449 Chronic obstructive pulmonary disease, unspecified: Secondary | ICD-10-CM

## 2018-12-20 DIAGNOSIS — J3089 Other allergic rhinitis: Secondary | ICD-10-CM

## 2018-12-20 DIAGNOSIS — R911 Solitary pulmonary nodule: Secondary | ICD-10-CM | POA: Diagnosis not present

## 2018-12-20 DIAGNOSIS — J9611 Chronic respiratory failure with hypoxia: Secondary | ICD-10-CM | POA: Diagnosis not present

## 2018-12-20 DIAGNOSIS — F17210 Nicotine dependence, cigarettes, uncomplicated: Secondary | ICD-10-CM

## 2018-12-20 MED ORDER — FLUTICASONE PROPIONATE 50 MCG/ACT NA SUSP: NASAL | 2 refills | Status: DC

## 2018-12-20 NOTE — Telephone Encounter (Signed)
Wonderful, I will review note.

## 2018-12-20 NOTE — Addendum Note (Signed)
Addended by: Vivia Ewing on: 12/20/2018 02:40 PM   Modules accepted: Orders

## 2018-12-20 NOTE — Progress Notes (Signed)
Subjective:    Patient ID: Kevin Nelson, male    DOB: 16-Feb-1950, 68 y.o.   MRN: [id number] Virtual Visit Via Video or Telephone Note:   This visit type was conducted due to national recommendations for restrictions regarding the COVID-19 pandemic .  This format is felt to be most appropriate for this patient at this time.  All issues noted in this document were discussed and addressed.  No physical exam was performed (except for noted visual exam findings with Video Visits).    I connected with Kevin Nelson by  telephone at [phone] and verified that I was speaking with the correct person using two identifiers. Location patient: home Location provider: [facility] Persons participating in the virtual visit: patient, physician   I discussed the limitations, risks, security and privacy concerns of performing an evaluation and management service by video and the availability of in person appointments. The patient expressed understanding and agreed to proceed.  Requesting MD/Service: Marnee Guarneri, NP Date of initial consultation: 01/25/18, by Dr. Merton Border Reason for consultation: Very severe COPD, smoker  PT PROFILE: 68 y.o. male smoker (started age 29, maximum 2 PPD) recently moved to the area with history of oxygen dependent COPD following here for COPD/pulmonary issues  DATA: 02/03/18 6MWT: Submaximal effort.  Test terminated after patient reached SPO2 84%. 02/14/18 PFTs: FVC: 2.28 L (56 %pred), FEV1: 0.80 L (25 %pred), FEV1/FVC: 35%, TLC: 7.77 L (121 %pred), DLCO 45 %pred.  Flow volume curve consistent with very severe obstruction.  No significant change after bronchodilator challenge. 04/14/18 CTA chest: Emphysema with bronchial thickening. Retained mucus in the dependent trachea. Prominent bilateral hilar lymph nodes are likely reactive. 5 mm nodular focus in the dependent left lower lobe is indeterminate for nodule versus atelectasis.  06/19/18 LDCT chest: Lung-RADS  2, benign appearance or behavior. LLL nodules stable. Continue annual screening with low-dose chest CT without contrast in 12 months.  HPI Patient is a 68 year old current smoker with a history of very severe COPD (stage IV COPD) previously followed by Dr. Merton Border.  We last saw him on 19 October 2018 after an admission for COPD exacerbation at [facility] in September.  During his visit in October we requalify him for oxygen use and he also was switched to bronchodilators via nebulizer as he was having difficulties with the metered-dose and dry powder inhalers.  Suspect that this is due to inability to breath-hold from severe COPD.  Since that visit he has been about baseline he does not have any new complaint with the exception of issues with severe nasal congestion at nighttime which causes him to mouth breathe.  Because of this he does wake up in the mornings with sore throat.  He has not had any hoarseness.  Cough is usually in the mornings, productive of whitish to grayish sputum.  No change in the character no hemoptysis.  He is now more motivated to quit smoking and inquired about patches.  We discussed proper use of nicotine replacement patches.  He was instructed that these can be obtained over-the-counter.  He does state that he wakes up in the mornings with a headache and wonders if his oxygen supplementation is enough to maintain saturations.  He voices no other complaint.   Review of Systems A 10 point review of systems was performed and it is as noted above otherwise negative.    Objective:   Physical Exam No physical exam was performed as this was via telephone visit.  The patient however  was noted to be able to complete sentences without difficulty had very fluid speech.  No hoarseness noted    Assessment & Plan:   1.  Very severe COPD: He has difficulties using dry powder and metered-dose inhalers due to inability to breath-hold, continue DuoNeb 4 times a day with extra dose if needed  during the day.  Up to no more than 5 times a day.  He may continue using Combivent when nebulizer is not available.  He is doing fairly well on this regimen.  We will see him in follow-up in 3 months time he is to contact us prior to that time should any new difficulties arise.  2.  Chronic hypoxic respiratory failure: Continue oxygen at 2 L/min, this helps him maintain above 90% with ambulation.  He is waking up with headaches in the morning and notes that he is mouth breather, will check oxygen saturations at nighttime (ONOX) to ensure he is getting adequate oxygen supplementation.  3.  Perennial rhinitis: This may be responsible for his mouth breathing issues.  Will treat with Flonase 1 inhalation to each nostril twice a day.  4.  Tobacco dependence due to cigarettes: Continues to smoke 5 to 6 cigarettes/day but states that he cannot quit this presently due to "issues" he is going through.  He however feels the need and sees the need to discontinue this habit we discussed use of patches and he is to try this modality to assist him with tobacco cessation.  I instructed him to take the patch off prior to bedtime and replace with a new patch in the morning this will allow him to sleep well at night without the stimulating effects of nicotine.  5.  Left lower lobe lung nodule: Stable, no need for reimaging until June 2021.  However, do note that this patient is a very poor candidate for any invasive procedure due to severe end-stage COPD.  6.  Metastatic prostate cancer: This issue adds complexity to his management, he has completed Lupron therapy.   Total visit time 18 minutes.  Renold Don, MD [facility]   This note was dictated using voice recognition software/Dragon.  Despite best efforts to proofread, errors can occur which can change the meaning.  Any change was purely unintentional.

## 2018-12-20 NOTE — Patient Instructions (Signed)
1.  We discussed the use of nicotine patches for smoking cessation.  These are available over-the-counter.  You may start with the 14 mg patch size since you have been smoking half a pack or less.  We discussed that you would take this patch off at bedtime and replace with a new patch in the morning.  Hopefully this will allow you to have a good night sleep without stimulation from nicotine.  2.  We have sent the medication to your pharmacy to help with nasal congestion this is a spray that you will use twice a day.  3.  We will check your oxygen level at nighttime to make sure you are getting enough oxygen nocturnally.  4.  We will see him in follow-up in 3 months time however do contact us prior to that time should any new lung issues arise.

## 2018-12-20 NOTE — Telephone Encounter (Signed)
Copied from [place] [id number]. Topic: General - Other >> Dec 20, 2018  1:43 PM Lennox Solders wrote: Reason for CRM: fyi Pt is calling to let Dillion Stowers know he saw pulmonologist dr Duwayne Heck today

## 2018-12-21 ENCOUNTER — Encounter: Payer: Self-pay | Admitting: Nurse Practitioner

## 2018-12-21 ENCOUNTER — Ambulatory Visit (INDEPENDENT_AMBULATORY_CARE_PROVIDER_SITE_OTHER): Payer: Medicare Other | Admitting: Nurse Practitioner

## 2018-12-21 ENCOUNTER — Other Ambulatory Visit: Payer: Self-pay | Admitting: Pulmonary Disease

## 2018-12-21 ENCOUNTER — Other Ambulatory Visit: Payer: Self-pay

## 2018-12-21 DIAGNOSIS — J029 Acute pharyngitis, unspecified: Secondary | ICD-10-CM | POA: Insufficient documentation

## 2018-12-21 DIAGNOSIS — R208 Other disturbances of skin sensation: Secondary | ICD-10-CM | POA: Diagnosis not present

## 2018-12-21 DIAGNOSIS — R200 Anesthesia of skin: Secondary | ICD-10-CM | POA: Insufficient documentation

## 2018-12-21 NOTE — Assessment & Plan Note (Signed)
Chronic issue, he plans on notifying his ENT provider for face to face assessment of issue.  IS also going to contact [facility] about sleep study, as has concerns about his mouth breathing.  Will review notes once available.  Return for face to face visit if ongoing or worsening.

## 2018-12-21 NOTE — Patient Instructions (Signed)
Sore Throat When you have a sore throat, your throat may feel:  Tender.  Burning.  Irritated.  Scratchy.  Painful when you swallow.  Painful when you talk. Many things can cause a sore throat, such as:  An infection.  Allergies.  Dry air.  Smoke or pollution.  Radiation treatment.  Gastroesophageal reflux disease (GERD).  A tumor. A sore throat can be the first sign of another sickness. It can happen with other problems, like:  Coughing.  Sneezing.  Fever.  Swelling in the neck. Most sore throats go away without treatment. Follow these instructions at home:      Take over-the-counter medicines only as told by your doctor. ? If your child has a sore throat, do not give your child aspirin.  Drink enough fluids to keep your pee (urine) pale yellow.  Rest when you feel you need to.  To help with pain: ? Sip warm liquids, such as broth, herbal tea, or warm water. ? Eat or drink cold or frozen liquids, such as frozen ice pops. ? Gargle with a salt-water mixture 3-4 times a day or as needed. To make a salt-water mixture, add -1 tsp (3-6 g) of salt to 1 cup (237 mL) of warm water. Mix it until you cannot see the salt anymore. ? Suck on hard candy or throat lozenges. ? Put a cool-mist humidifier in your bedroom at night. ? Sit in the bathroom with the door closed for 5-10 minutes while you run hot water in the shower.  Do not use any products that contain nicotine or tobacco, such as cigarettes, e-cigarettes, and chewing tobacco. If you need help quitting, ask your doctor.  Wash your hands well and often with soap and water. If soap and water are not available, use hand sanitizer. Contact a doctor if:  You have a fever for more than 2-3 days.  You keep having symptoms for more than 2-3 days.  Your throat does not get better in 7 days.  You have a fever and your symptoms suddenly get worse.  Your child who is 3 months to 7 years old has a temperature of  102.22F (39C) or higher. Get help right away if:  You have trouble breathing.  You cannot swallow fluids, soft foods, or your saliva.  You have swelling in your throat or neck that gets worse.  You keep feeling sick to your stomach (nauseous).  You keep throwing up (vomiting). Summary  A sore throat is pain, burning, irritation, or scratchiness in the throat. Many things can cause a sore throat.  Take over-the-counter medicines only as told by your doctor. Do not give your child aspirin.  Drink plenty of fluids, and rest as needed.  Contact a doctor if your symptoms get worse or your sore throat does not get better within 7 days. This information is not intended to replace advice given to you by your health care provider. Make sure you discuss any questions you have with your health care provider. Document Released: 10/14/2007 Document Revised: 06/06/2017 Document Reviewed: 06/06/2017 Elsevier Patient Education  2020 [employer].

## 2018-12-21 NOTE — Progress Notes (Signed)
There were no vitals taken for this visit.   Subjective:    Patient ID: Kevin Nelson, male    DOB: [birth year], 69 y.o.   MRN: [id number]  HPI: Kevin Nelson is a 68 y.o. male  Chief Complaint  Patient presents with  . Sore Throat    pt states this is a issue he has had for a while, thinks he needs to go to ENT  . Foot Pain    Left foot, states that the base of this toes are starting to hurt and become numb    . This visit was completed via telephone due to the restrictions of the COVID-19 pandemic. All issues as above were discussed and addressed but no physical exam was performed. If it was felt that the patient should be evaluated in the office, they were directed there. The patient verbally consented to this visit. Patient was unable to complete an audio/visual visit due to Technical difficulties,Lack of internet. Due to the catastrophic nature of the COVID-19 pandemic, this visit was done through audio contact only. . Location of the patient: home . Location of the provider: work . Those involved with this call:  . Provider: Marnee Guarneri, DNP . CMA: Yvonna Alanis, CMA . Front Desk/Registration: Jill Side  . Time spent on call: 15 minutes on the phone discussing health concerns. 10 minutes total spent in review of patient's record and preparation of their chart.  . I verified patient identity using two factors (patient name and date of birth). Patient consents verbally to being seen via telemedicine visit today.    SORE THROAT Is a mouth breather, he plans on seeing ENT for this.  Is established with [facility] and is about to get hearing aide, but is going to get throat checked in upcoming weeks he reports.  Has not been notified of time for sleep study, is going to call [facility].  Wears O2 daily for COPD and is a smoker.  Has had ongoing sore throat on/off for several years he reports. Fever: no Cough: at baseline Shortness of breath: at baseline, no worse  Wheezing: at baseline  Chest pain: no Chest tightness: no Chest congestion: no Nasal congestion: no Runny nose: no Post nasal drip: no Sneezing: no Sore throat: no Swollen glands: no Sinus pressure: no Headache: no Face pain: no Toothache: no Ear pain: none Ear pressure: none Eyes red/itching:no Eye drainage/crusting: no  Vomiting: no Rash: no Fatigue: no  FOOT NUMBNESS Reports pain and numbness to base of left toes. Has been numb for a couple weeks on and off.  Only notices it when he wears his shoes for a long time and walks.  Improves when takes shoes off.  Denies color change or wounds. Duration: weeks Involved foot: left Mechanism of injury: unknown Location: base of toes left foot Onset: gradual  Severity: mild  Quality:  burning and throbbing Frequency: intermittent Radiation: no Aggravating factors: when puts shoes on for a long time and walking  Alleviating factors: taking shoe off and rest  Status: stable Treatments attempted: taking shoe off  Relief with NSAIDs?:  none used Weakness with weight bearing or walking: no Morning stiffness: no Swelling: no Redness: no Bruising: no Paresthesias / decreased sensation: no  Fevers:no  Relevant past medical, surgical, family and social history reviewed and updated as indicated. Interim medical history since our last visit reviewed. Allergies and medications reviewed and updated.  Review of Systems  Constitutional: Negative for activity change, diaphoresis, fatigue and fever.  HENT: Positive for sore throat.   Respiratory: Negative for cough, chest tightness, shortness of breath and wheezing.   Cardiovascular: Negative for chest pain, palpitations and leg swelling.  Gastrointestinal: Negative for abdominal distention, abdominal pain, constipation, diarrhea, nausea and vomiting.  Neurological: Negative for dizziness, syncope, weakness, light-headedness, numbness and headaches.  Psychiatric/Behavioral: Negative.      Per HPI unless specifically indicated above     Objective:    There were no vitals taken for this visit.  Wt Readings from Last 3 Encounters:  10/19/18 110 lb (49.9 kg)  10/09/18 104 lb (47.2 kg)  09/29/18 112 lb (50.8 kg)    Physical Exam   Unable to assess due to telephone visit only, he is aware of inability to assess this.  Results for orders placed or performed in visit on 10/25/18  HCV RNA quant  Result Value Ref Range   Hepatitis C Quantitation HCV Not Detected IU/mL   Test Information Comment       Assessment & Plan:   Problem List Items Addressed This Visit      Other   Sore throat - Primary    Chronic issue, he plans on notifying his ENT provider for face to face assessment of issue.  IS also going to contact [facility] about sleep study, as has concerns about his mouth breathing.  Will review notes once available.  Return for face to face visit if ongoing or worsening.      Numbness of left foot    Noted only when shoes on, have recommended trial of new shoes and ensuring he is not tying current shoes too tightly which could lead to numbness.  Discussed ensuring supportive shoes are worn.  Return for worsening or continued discomfort.           I discussed the assessment and treatment plan with the patient. The patient was provided an opportunity to ask questions and all were answered. The patient agreed with the plan and demonstrated an understanding of the instructions.   The patient was advised to call back or seek an in-person evaluation if the symptoms worsen or if the condition fails to improve as anticipated.   I provided 15 minutes of time during this encounter.  Follow up plan: Return in about 2 months (around 02/21/2019) for COPD, Mood, and Hep C.

## 2018-12-21 NOTE — Assessment & Plan Note (Addendum)
Noted only when shoes on, have recommended trial of new shoes and ensuring he is not tying current shoes too tightly which could lead to numbness.  Discussed ensuring supportive shoes are worn.  Return for worsening or continued discomfort.

## 2018-12-26 ENCOUNTER — Telehealth: Payer: Self-pay

## 2018-12-28 ENCOUNTER — Telehealth: Payer: Self-pay | Admitting: Pulmonary Disease

## 2018-12-28 ENCOUNTER — Telehealth: Payer: Self-pay | Admitting: Nurse Practitioner

## 2018-12-28 DIAGNOSIS — J029 Acute pharyngitis, unspecified: Secondary | ICD-10-CM

## 2018-12-28 NOTE — Telephone Encounter (Signed)
Called pt and let him know that referral has been placed. Pt verbalized understanding.

## 2018-12-28 NOTE — Telephone Encounter (Signed)
Called and spoke to pt.  Pt stated that [employer] provided him with mask for oxygen, as he is a mouth breathing. Pt does not feel that he is getting enough oxygen at night due to it being a lower liter flow. Pt currently on 2L cont.  Pt is questioning if he can increased liter flow while sleeping.   LG, please advise. Thanks

## 2018-12-28 NOTE — Telephone Encounter (Signed)
Pt is aware of below message and voiced his uderstanding.  Pt stated that he is currently seeing ENT and will mention this matter at next OV.  Nothing further is needed.

## 2018-12-28 NOTE — Telephone Encounter (Signed)
pt says that he need a referral placed for [facility] due to Medicaid,  please assist

## 2018-12-28 NOTE — Telephone Encounter (Signed)
Referral placed for patient to return to [facility]

## 2018-12-28 NOTE — Telephone Encounter (Signed)
He can increase to 3 LPM but if he is still having issues will need to repeat ONOX on O2 to see what is going on. He may want to see ENT to see why he has such nasal obstruction

## 2019-01-04 ENCOUNTER — Other Ambulatory Visit: Payer: Self-pay | Admitting: Otolaryngology

## 2019-01-04 ENCOUNTER — Telehealth: Payer: Self-pay | Admitting: Nurse Practitioner

## 2019-01-04 ENCOUNTER — Other Ambulatory Visit (HOSPITAL_COMMUNITY): Payer: Self-pay | Admitting: Otolaryngology

## 2019-01-04 DIAGNOSIS — D380 Neoplasm of uncertain behavior of larynx: Secondary | ICD-10-CM

## 2019-01-04 DIAGNOSIS — C329 Malignant neoplasm of larynx, unspecified: Secondary | ICD-10-CM

## 2019-01-04 MED ORDER — HYDROCODONE-ACETAMINOPHEN 5-325 MG PO TABS: 1.0000 | ORAL_TABLET | Freq: Four times a day (QID) | ORAL | 0 refills | Status: DC | PRN

## 2019-01-04 MED ORDER — LIDOCAINE VISCOUS HCL 2 % MT SOLN: 15.0000 mL | Freq: Four times a day (QID) | OROMUCOSAL | 2 refills | Status: DC | PRN

## 2019-01-04 NOTE — Telephone Encounter (Signed)
Copied from [place] [id number]. Topic: General - Other >> Jan 04, 2019 10:42 AM Lennox Solders wrote: Reason for CRM: pt would like Kaelene Elliston to call him back . Pt has throat cancer

## 2019-01-04 NOTE — Telephone Encounter (Signed)
Discussed with patient his new diagnosis of laryngeal cancer.  Is a long time smoker and has had sore throat for 3 years, which he endorses he should have had looked at.  Recently saw ENT and they have diagnosed him with laryngeal cancer.  Reports he has more testing upcoming and they are discussing radiation.  He inquired into pain medication at this time, will give 5 day supply Norco and send in viscous lidocaine for sore throat as needed.  Also discussed palliative referral for further pain management guidance, which he agrees with.  Referral placed.  He stated appreciation "for all you have done for me". Discussed with him that palliative would be beneficial for goals of care discussions and support.

## 2019-01-09 ENCOUNTER — Telehealth: Payer: Self-pay | Admitting: Adult Health Nurse Practitioner

## 2019-01-09 NOTE — Telephone Encounter (Signed)
Spoke with patient regarding Palliative services and he was in agreement with this.  I have scheduled a Telephone Consult for 01/16/19 @ 4 PM

## 2019-01-10 ENCOUNTER — Other Ambulatory Visit: Payer: Self-pay

## 2019-01-10 ENCOUNTER — Ambulatory Visit (INDEPENDENT_AMBULATORY_CARE_PROVIDER_SITE_OTHER): Payer: Medicare Other

## 2019-01-10 VITALS — Ht 66.0 in | Wt 112.0 lb

## 2019-01-10 DIAGNOSIS — Z0000 Encounter for general adult medical examination without abnormal findings: Secondary | ICD-10-CM | POA: Diagnosis not present

## 2019-01-10 DIAGNOSIS — Z1211 Encounter for screening for malignant neoplasm of colon: Secondary | ICD-10-CM

## 2019-01-10 DIAGNOSIS — C61 Malignant neoplasm of prostate: Secondary | ICD-10-CM

## 2019-01-10 NOTE — Progress Notes (Signed)
Subjective:   Kevin Nelson is a 68 y.o. male who presents for an Initial Medicare Annual Wellness Visit.  This visit is being conducted via phone call  - after an attmept to do on video chat - due to the COVID-19 pandemic. This patient has given me verbal consent via phone to conduct this visit, patient states they are participating from their home address. Some vital signs may be absent or patient reported.   Patient identification: identified by name, DOB, and current address.    Review of Systems   Cardiac Risk Factors include: advanced age (>29men, >61 women);dyslipidemia;hypertension;male gender;smoking/ tobacco exposure    Objective:    Today's Vitals   01/10/19 1105 01/10/19 1106  Weight: 112 lb (50.8 kg)   Height: 5\' 6"  (1.676 m)   PainSc:  5    Body mass index is 18.08 kg/m.  Advanced Directives 01/10/2019 09/29/2018 09/29/2018 04/15/2018 04/14/2018  Does Patient Have a Medical Advance Directive? Yes No No Yes Yes  Type of Advance Directive Healthcare Power of [place]  Does patient want to make changes to medical advance directive? - - - No - Patient declined -  Copy of [place] in Chart? No - copy requested - - No - copy requested -  Would patient like information on creating a medical advance directive? - No - Patient declined - - -    Current Medications (verified) Outpatient Encounter Medications as of 01/10/2019  Medication Sig  . clonazePAM (KLONOPIN) 0.5 MG tablet Take 1 tablet (0.5 mg total) by mouth 2 (two) times daily as needed. for anxiety (Patient taking differently: Take 0.5 mg by mouth 2 (two) times daily. )  . fluticasone (FLONASE) 50 MCG/ACT nasal spray 1 spray into each nostril twice a day (Patient taking differently: Place 1 spray into both nostrils 2 (two) times daily as needed for allergies or rhinitis. )  . Ipratropium-Albuterol (COMBIVENT RESPIMAT) 20-100 MCG/ACT AERS  respimat Inhale 1 puff into the lungs every 6 (six) hours as needed.  [name] ipratropium-albuterol (DUONEB) 0.5-2.5 (3) MG/3ML SOLN Take 3 mLs by nebulization every 6 (six) hours as needed. (Patient taking differently: Take 3 mLs by nebulization every 6 (six) hours as needed (shortness of breath). )  . lidocaine (XYLOCAINE) 2 % solution Use as directed 15 mLs in the mouth or throat every 6 (six) hours as needed for mouth pain (for sore throat).  . STIOLTO RESPIMAT 2.5-2.5 MCG/ACT AERS Inhale 2 puffs into the lungs 2 (two) times daily as needed (shortness of breath).    No facility-administered encounter medications on file as of 01/10/2019.    Allergies (verified) Patient has no known allergies.   History: Past Medical History:  Diagnosis Date  . Basal cell carcinoma   . End stage COPD ([place])   . Hepatitis C   . History of nonmelanoma skin cancer   . Prostate cancer ([place])   . Squamous cell skin cancer   . Throat cancer [facility])    Past Surgical History:  Procedure Laterality Date  . CHOLECYSTECTOMY    . FACIAL LACERATION REPAIR    . SKIN CANCER EXCISION    . TRIGGER FINGER RELEASE Left    Family History  Problem Relation Age of Onset  . Cancer Sister   . Cancer - Cervical Mother   . Heart attack Brother   . Heart attack Maternal Grandfather    Social History   Socioeconomic History  .  Marital status: Divorced    Spouse name: Not on file  . Number of children: Not on file  . Years of education: Not on file  . Highest education level: Not on file  Occupational History  . Not on file  Tobacco Use  . Smoking status: Current Every Day Smoker    Packs/day: 0.25    Years: 52.00    Pack years: 13.00    Types: Cigarettes  . Smokeless tobacco: Never Used  . Tobacco comment: Currently smoking 5 to 6 cigarettes/day at his peak was smoking 2 packs/day until 14 April 2018  Substance and Sexual Activity  . Alcohol use: Yes    Alcohol/week: 21.0 standard drinks    Types: 21 Cans of  beer per week    Comment: 3 a day   . Drug use: Never  . Sexual activity: Not on file  Other Topics Concern  . Not on file  Social History Narrative  . Not on file   Social Determinants of Health   Financial Resource Strain: Low Risk   . Difficulty of Paying Living Expenses: Not hard at all  Food Insecurity:   . Worried About Charity fundraiser in the Last Year: Not on file  . Ran Out of Food in the Last Year: Not on file  Transportation Needs:   . Lack of Transportation (Medical): Not on file  . Lack of Transportation (Non-Medical): Not on file  Physical Activity:   . Days of Exercise per Week: Not on file  . Minutes of Exercise per Session: Not on file  Stress:   . Feeling of Stress : Not on file  Social Connections:   . Frequency of Communication with Friends and Family: Not on file  . Frequency of Social Gatherings with Friends and Family: Not on file  . Attends Religious Services: Not on file  . Active Member of Clubs or Organizations: Not on file  . Attends Archivist Meetings: Not on file  . Marital Status: Not on file   Tobacco Counseling Ready to quit: Yes Counseling given: Yes Comment: Currently smoking 5 to 6 cigarettes/day at his peak was smoking 2 packs/day until 14 April 2018   Clinical Intake:  Pre-visit preparation completed: Yes  Pain : 0-10 Pain Score: 5  Pain Type: Chronic pain Pain Location: Throat Pain Descriptors / Indicators: Sore     Nutritional Status: BMI <19  Underweight Nutritional Risks: None  How often do you need to have someone help you when you read instructions, pamphlets, or other written materials from your doctor or pharmacy?: 1 - Never  Interpreter Needed?: No  Information entered by :: Florella Mcneese,LPN  Activities of Daily Living In your present state of health, do you have any difficulty performing the following activities: 01/10/2019 09/29/2018  Hearing? Y N  Comment right ear hearing aid -  Vision? N N    Comment reading glasses -  Difficulty concentrating or making decisions? N N  Walking or climbing stairs? Y N  Comment SOB -  Dressing or bathing? N N  Doing errands, shopping? N -  Preparing Food and eating ? N -  Using the Toilet? N -  In the past six months, have you accidently leaked urine? N -  Do you have problems with loss of bowel control? N -  Managing your Medications? N -  Managing your Finances? N -  Housekeeping or managing your Housekeeping? N -  Some recent data might be hidden  Immunizations and Health Maintenance Immunization History  Administered Date(s) Administered  . Influenza-Unspecified 10/18/2017   Health Maintenance Due  Topic Date Due  . PNA vac Low Risk Adult (1 of 2 - PCV13) 10/17/2017    Patient Care Team: [name], NP as PCP - General (Nurse Practitioner) Minor, Dalbert Garnet, RN as Case Manager Greg Cutter, LCSW as Social Worker (Licensed Clinical Social Worker) De Hollingshead, [facility] as Pharmacist (Pharmacist)  Indicate any recent Medical Services you may have received from other than [facility] providers in the past year (date may be approximate).    Assessment:   This is a routine wellness examination for Jaqualyn.  Hearing/Vision screen No exam data present  Dietary issues and exercise activities discussed: Current Exercise Habits: Home exercise routine, Type of exercise: strength training/weights(depending on the day), Time (Minutes): 10, Frequency (Times/Week): 7, Weekly Exercise (Minutes/Week): 70, Intensity: Mild, Exercise limited by: respiratory conditions(s)  Goals    . Pharm D "I want to take care of myself" (pt-stated)     Current Barriers:  . Polypharmacy; complex patient with multiple comorbidities including COPD, tobacco abuse, prostate cancer, s/p tx for Hep C . Self-manages medications.  o Prostate CA: s/p Lupron treatment; greatest concern today is regarding long term effects of ADT; notes continued decreased  sexual function. Notes he is in a new relationship with a friend from childhood o COPD/tobacco abuse: Duoneb or Combivent as needed; notes he continues to smoke. Follows w/ Dr. Camillo Flaming pulmonary o S/p tx Hep C: s/p Mayvret. Due for viral load recheck.  o Headaches: reports headaches every morning for the past ~2 years. Feels it started around the same time as Lupron injection, however, has continued, even though he finished therapy a few months ago. Unsure if he snores at night, but notes that he wakes up with a sore throat from mouth breathing. Takes ibuprofen for the pain, resolves with this.  o Trigger finger: surgery for planned next Wednesday.   Pharmacist Clinical Goal(s):  [name] Over the next 90 days, patient will work with PharmD and provider towards optimized medication management  Interventions: . Comprehensive medication review performed; medication list updated in electronic medical record . Discussed that ibuprofen is safer option for him than APAP given liver disease . Discussed morning headaches. Discussed sleep study. Ordered today . Checking viral load today.   Patient Self Care Activities:  . Patient will take medications as prescribed  Initial goal documentation      . PharmD "I'm worried about my Hepatitis" (pt-stated)     Current Barriers:  [name] Knowledge Deficits related to plan for treatment : patient completed 8 weeks of treatment w/ Mayvret for Hep C (June-July 2020). He was unsure what his follow up plan was . Collaborated w/ Ginger Feldpauch (Dr. Allen Norris); he is scheduled for a f/u appointment w/ Dr. Allen Norris in November and will need a HCV RNA drawn. As patient was a difficult stick with his last lab work, collaborated w/ Ginger and [employer], and this lab will be added on to anything he will need at his next appointment w/ [name].  Pharmacist Clinical Goal(s):  [name] Over the next 30 days, patient will work with CCM team to address needs related to optimized medication  management  Interventions:  Contacted patient. He is appreciative of the help in coordinating his follow up for Hep C. Is is still very concerned about making sure this has been taken care of. Provided reassurance that 8 weeks of treatment w/  Mayvret generally is effective.   Reminded of upcoming appointments w/ [name] Cannady and Dr. Allen Norris.  Patient Self Care Activities:  . Self administers medications as prescribed . Attends all scheduled provider appointments . Calls provider office for new concerns or questions  Please see past updates related to this goal by clicking on the "Past Updates" button in the selected goal      . RN-COPD Management (pt-stated)     Current Barriers:  [name] Knowledge deficits related to basic COPD self care/management . Limited Social Support   Case Manager Clinical Goal(s):  Over the next 90 days, patient will be able to verbalize understanding of COPD action plan and when to seek appropriate levels of medical care  Over the next 90 days, patient will not be hospitalized for COPD exacerbation   Interventions:  . Reviewed COPD action plan . Patient reports having all COPD medications . Reviewed upcoming appointment with Pulmonology 12/2 . Patient reporting he is doing well with combivent and duonebs . His main complaint is waking up with headaches which he states could be related to being a mouth breather and only having nasal canula 02. PCP discussed sleep study for patient- Referral placed for [facility] in [place].  [name] Continues to smoke, with no interest in quitting  . Continues to be on 02 . Patient stating he is having hand surgery coming up to address trigger finger.    Patient Self Care Activities:  Takes medications as prescribed including inhalers  Does not currently perform self care activities related to COPD management  Please see past updates related to this goal by clicking on the "Past Updates" button in the  selected goal      . RN-I need to see my urologist (pt-stated)     Current Barriers:  [name] Knowledge Deficits related to obtaining an appointment with urology provider . Transportation barriers  Nurse Case Manager Clinical Goal(s):  [name] Over the next 30 days, patient will work with [facility] to address needs related to Urology appt and potential transportation barriers   Interventions:  . Advised patient to talk with his urologist about frustrated about lingering side effects of Lupron shots decreasing his libido and sexual function.  Nash Dimmer with PCP regarding patient's expressed frustrations over decreased libido and sexual function . Discussed plans with patient for ongoing care management follow up and provided patient with direct contact information for care management team . Discussed medication options with CCM Pharm D- no good options related to patient's cardiac risk . Upcoming appointment with [facility] on 12/28.  Patient Self Care Activities:  . Currently UNABLE TO independently manage urology appointments related to other health issues going on  . Performs ADL's independently . Performs IADL's independently  Please see past updates related to this goal by clicking on the "Past Updates" button in the selected goal      . SW-"I need more support. I'm anxious all the time" (pt-stated)     Current Barriers:  [name] Knowledge Deficits related to housing options/community resources that are available to him . Financial Constraints - during his most recent PCP visit, he voiced concerns about his housing and finances, currently lives with two friends (childhood friend)   Clinical Social Worker Clinical Goal(s):  [name] Over the next 90 days, patient will verbalize understanding of plan for gaining crisis support resource education and implementing appropriate self-care tools into his daily routine to improve overall self-care . Over the next 90 days, patient  will work with [facility]  LCSW to address needs related to housing, financial bariers and lack of self-care     Interventions: . Patient interviewed and appropriate assessments performed . Provided mental health counseling with regard to coping with daily stress. [name] LCSW provided education on deep breathing and relaxation techniques to implement into his daily routine to combat stressors. Patient admits that his anxiety continues but he has been challenging his negative thinking in order to gain a more positive perspective. Patient admits that he could benefit from socialization but due to [place] he has been staying at home and taking appropriate precautions. LCSW provided education on available mental health support resources and socialization opportunities within his community. . Provided patient with information about low income housing resources that are available. However, currently Section 8 and Public Housing wait list is still closed.  [name] LCSW provided reflective listening and implemented appropriate interventions to help suppport patient and her emotional needs  . Discussed plans with patient for ongoing care management follow up and provided patient with direct contact information for care management team . Advised patient to follow up with provided community resources as needed . Assisted patient/caregiver with obtaining information about health plan benefits . Patient confirms stable transportation to his upcoming PCP appointment next month. . Patient admits ongoing SOB. LCSW provided education on ways to induce the relaxation body response to help with deep breathing.  . Patient successfully seeing his new neurologist.  . Patient confirms that he will come to [facility] next week to complete needed lab work.   Patient Self Care Activities:  . Attends all scheduled provider appointments . Calls provider office for new concerns or questions  Please see past updates related to this goal by clicking on the "Past Updates"  button in the selected goal        Depression Screen PHQ 2/9 Scores 01/10/2019 07/31/2018 01/27/2018 01/13/2018  PHQ - 2 Score 1 6 3 3   PHQ- 9 Score - 19 10 10     Fall Risk Fall Risk  01/10/2019 07/31/2018  Falls in the past year? 0 0  Number falls in past yr: 0 0  Injury with Fall? 0 0  Follow up - Falls evaluation completed    [place]:  Any stairs in or around the home? No  If so, are there any without handrails? No   Home free of loose throw rugs in walkways, pet beds, electrical cords, etc? Yes  Adequate lighting in your home to reduce risk of falls? Yes   ASSISTIVE DEVICES UTILIZED TO PREVENT FALLS:  Life alert? No  Use of a cane, walker or w/c? No  Grab bars in the bathroom? No  Shower chair or bench in shower? No  Elevated toilet seat or a handicapped toilet? No    TIMED UP AND GO:  Unable to perform    Cognitive Function:        Screening Tests Health Maintenance  Topic Date Due  . PNA vac Low Risk Adult (1 of 2 - PCV13) 10/17/2017  . TETANUS/TDAP  01/14/2019 (Originally 10/18/1969)  . INFLUENZA VACCINE  04/18/2019 (Originally 08/19/2018)  . COLONOSCOPY  04/19/2019 (Originally 10/18/2002)  . Hepatitis C Screening  Completed    Qualifies for Shingles Vaccine? Yes  Zostavax completed n/a Due for Shingrix. Education has been provided regarding the importance of this vaccine. Pt has been advised to call insurance company to determine out of pocket expense. Advised may also receive vaccine  at local pharmacy or Health Dept. Verbalized acceptance and understanding.  Tdap: Discussed need for TD/TDAP vaccine, patient verbalized understanding that this is not covered as a preventative with there insurance and to call the office if he develops any new skin injuries, ie: cuts, scrapes, bug bites, or open wounds.  Flu Vaccine: due, declined   Pneumococcal Vaccine: due now.   Cancer Screenings:  Colorectal Screening: discussed  cologuard, ordered   Lung Cancer Screening: (Low Dose CT Chest recommended if Age 83-80 years, 30 pack-year currently smoking OR have quit w/in 15years.) does qualify.   ordered  Additional Screening:  Hepatitis C Screening: does qualify; Completed 04/19/2018  Vision Screening: Recommended annual ophthalmology exams for early detection of glaucoma and other disorders of the eye. Is the patient up to date with their annual eye exam?  No    Dental Screening: Recommended annual dental exams for proper oral hygiene  Community Resource Referral:  CRR required this visit?  No        Plan:  I have personally reviewed and addressed the Medicare Annual Wellness questionnaire and have noted the following in the patient's chart:  A. Medical and social history B. Use of alcohol, tobacco or illicit drugs  C. Current medications and supplements D. Functional ability and status E.  Nutritional status F.  Physical activity G. Advance directives H. List of other physicians I.  Hospitalizations, surgeries, and ER visits in previous 12 months J.  [place] such as hearing and vision if needed, cognitive and depression L. Referrals and appointments   In addition, I have reviewed and discussed with patient certain preventive protocols, quality metrics, and best practice recommendations. A written personalized care plan for preventive services as well as general preventive health recommendations were provided to patient.   Signed,    Bevelyn Ngo, LPN   [id number]  Nurse Health Advisor   Nurse Notes:  None

## 2019-01-10 NOTE — Patient Instructions (Signed)
Kevin Nelson , Thank you for taking time to come for your Medicare Wellness Visit. I appreciate your ongoing commitment to your health goals. Please review the following plan we discussed and let me know if I can assist you in the future.   Screening recommendations/referrals: Colonoscopy: cologuard ordered  Recommended yearly ophthalmology/optometry visit for glaucoma screening and checkup Recommended yearly dental visit for hygiene and checkup  Vaccinations: Influenza vaccine: declined  Pneumococcal vaccine: due now  Tdap vaccine: declined  Shingles vaccine: shingrix eligible     Advanced directives: Please bring a copy of your health care power of attorney and living will to the office at your convenience.  Conditions/risks identified: COPD, If you wish to quit smoking, help is available. For free tobacco cessation program offerings call the [employer] at [phone] or Live Well Line at 6783637385. You may also visit [email or web address] or email [email or web address] for more information on other programs.   Next appointment: Follow up in one year for your annual wellness visit.   Preventive Care 25 Years and Older, Male Preventive care refers to lifestyle choices and visits with your health care provider that can promote health and wellness. What does preventive care include?  A yearly physical exam. This is also called an annual well check.  Dental exams once or twice a year.  Routine eye exams. Ask your health care provider how often you should have your eyes checked.  Personal lifestyle choices, including:  Daily care of your teeth and gums.  Regular physical activity.  Eating a healthy diet.  Avoiding tobacco and drug use.  Limiting alcohol use.  Practicing safe sex.  Taking low doses of aspirin every day.  Taking vitamin and mineral supplements as recommended by your health care provider. What happens during an annual well check? The  services and screenings done by your health care provider during your annual well check will depend on your age, overall health, lifestyle risk factors, and family history of disease. Counseling  Your health care provider may ask you questions about your:  Alcohol use.  Tobacco use.  Drug use.  Emotional well-being.  Home and relationship well-being.  Sexual activity.  Eating habits.  History of falls.  Memory and ability to understand (cognition).  Work and work Statistician. Screening  You may have the following tests or measurements:  Height, weight, and BMI.  Blood pressure.  Lipid and cholesterol levels. These may be checked every 5 years, or more frequently if you are over 60 years old.  Skin check.  Lung cancer screening. You may have this screening every year starting at age 83 if you have a 30-pack-year history of smoking and currently smoke or have quit within the past 15 years.  Fecal occult blood test (FOBT) of the stool. You may have this test every year starting at age 61.  Flexible sigmoidoscopy or colonoscopy. You may have a sigmoidoscopy every 5 years or a colonoscopy every 10 years starting at age 59.  Prostate cancer screening. Recommendations will vary depending on your family history and other risks.  Hepatitis C blood test.  Hepatitis B blood test.  Sexually transmitted disease (STD) testing.  Diabetes screening. This is done by checking your blood sugar (glucose) after you have not eaten for a while (fasting). You may have this done every 1-3 years.  Abdominal aortic aneurysm (AAA) screening. You may need this if you are a current or former smoker.  Osteoporosis. You may be screened starting at age  70 if you are at high risk. Talk with your health care provider about your test results, treatment options, and if necessary, the need for more tests. Vaccines  Your health care provider may recommend certain vaccines, such as:  Influenza  vaccine. This is recommended every year.  Tetanus, diphtheria, and acellular pertussis (Tdap, Td) vaccine. You may need a Td booster every 10 years.  Zoster vaccine. You may need this after age 10.  Pneumococcal 13-valent conjugate (PCV13) vaccine. One dose is recommended after age 69.  Pneumococcal polysaccharide (PPSV23) vaccine. One dose is recommended after age 22. Talk to your health care provider about which screenings and vaccines you need and how often you need them. This information is not intended to replace advice given to you by your health care provider. Make sure you discuss any questions you have with your health care provider. Document Released: 01/31/2015 Document Revised: 09/24/2015 Document Reviewed: 11/05/2014 Elsevier Interactive Patient Education  2017 [place] Prevention in the Home Falls can cause injuries. They can happen to people of all ages. There are many things you can do to make your home safe and to help prevent falls. What can I do on the outside of my home?  Regularly fix the edges of walkways and driveways and fix any cracks.  Remove anything that might make you trip as you walk through a door, such as a raised step or threshold.  Trim any bushes or trees on the path to your home.  Use bright outdoor lighting.  Clear any walking paths of anything that might make someone trip, such as rocks or tools.  Regularly check to see if handrails are loose or broken. Make sure that both sides of any steps have handrails.  Any raised decks and porches should have guardrails on the edges.  Have any leaves, snow, or ice cleared regularly.  Use sand or salt on walking paths during winter.  Clean up any spills in your garage right away. This includes oil or grease spills. What can I do in the bathroom?  Use night lights.  Install grab bars by the toilet and in the tub and shower. Do not use towel bars as grab bars.  Use non-skid mats or decals  in the tub or shower.  If you need to sit down in the shower, use a plastic, non-slip stool.  Keep the floor dry. Clean up any water that spills on the floor as soon as it happens.  Remove soap buildup in the tub or shower regularly.  Attach bath mats securely with double-sided non-slip rug tape.  Do not have throw rugs and other things on the floor that can make you trip. What can I do in the bedroom?  Use night lights.  Make sure that you have a light by your bed that is easy to reach.  Do not use any sheets or blankets that are too big for your bed. They should not hang down onto the floor.  Have a firm chair that has side arms. You can use this for support while you get dressed.  Do not have throw rugs and other things on the floor that can make you trip. What can I do in the kitchen?  Clean up any spills right away.  Avoid walking on wet floors.  Keep items that you use a lot in easy-to-reach places.  If you need to reach something above you, use a strong step stool that has a grab bar.  Keep  electrical cords out of the way.  Do not use floor polish or wax that makes floors slippery. If you must use wax, use non-skid floor wax.  Do not have throw rugs and other things on the floor that can make you trip. What can I do with my stairs?  Do not leave any items on the stairs.  Make sure that there are handrails on both sides of the stairs and use them. Fix handrails that are broken or loose. Make sure that handrails are as long as the stairways.  Check any carpeting to make sure that it is firmly attached to the stairs. Fix any carpet that is loose or worn.  Avoid having throw rugs at the top or bottom of the stairs. If you do have throw rugs, attach them to the floor with carpet tape.  Make sure that you have a light switch at the top of the stairs and the bottom of the stairs. If you do not have them, ask someone to add them for you. What else can I do to help  prevent falls?  Wear shoes that:  Do not have high heels.  Have rubber bottoms.  Are comfortable and fit you well.  Are closed at the toe. Do not wear sandals.  If you use a stepladder:  Make sure that it is fully opened. Do not climb a closed stepladder.  Make sure that both sides of the stepladder are locked into place.  Ask someone to hold it for you, if possible.  Clearly mark and make sure that you can see:  Any grab bars or handrails.  First and last steps.  Where the edge of each step is.  Use tools that help you move around (mobility aids) if they are needed. These include:  Canes.  Walkers.  Scooters.  Crutches.  Turn on the lights when you go into a dark area. Replace any light bulbs as soon as they burn out.  Set up your furniture so you have a clear path. Avoid moving your furniture around.  If any of your floors are uneven, fix them.  If there are any pets around you, be aware of where they are.  Review your medicines with your doctor. Some medicines can make you feel dizzy. This can increase your chance of falling. Ask your doctor what other things that you can do to help prevent falls. This information is not intended to replace advice given to you by your health care provider. Make sure you discuss any questions you have with your health care provider. Document Released: 10/31/2008 Document Revised: 06/12/2015 Document Reviewed: 02/08/2014 Elsevier Interactive Patient Education  2017 [employer].

## 2019-01-11 ENCOUNTER — Other Ambulatory Visit: Payer: Self-pay | Admitting: Nurse Practitioner

## 2019-01-11 ENCOUNTER — Telehealth: Payer: Self-pay | Admitting: Nurse Practitioner

## 2019-01-11 MED ORDER — HYDROCODONE-ACETAMINOPHEN 5-325 MG PO TABS: 1.0000 | ORAL_TABLET | Freq: Four times a day (QID) | ORAL | 0 refills | Status: AC | PRN

## 2019-01-11 NOTE — Telephone Encounter (Signed)
Refill sent.

## 2019-01-11 NOTE — Telephone Encounter (Signed)
Medication: HYDROcodone-acetaminophen (NORCO) 5-325 MG tablet [id number]  ENDED Patient is requesting medication for his throat cancer. He does not go for a throat cancer scan until 01/15/19. Patient is out of medication.  Has the patient contacted their pharmacy? Yes  (Agent: If no, request that the patient contact the pharmacy for the refill.) (Agent: If yes, when and what did the pharmacy advise?)  Preferred Pharmacy (with phone number or street name): [facility] [id number], [place] [facility]  Phone:  [phone] Fax:  [phone]     Agent: Please be advised that RX refills may take up to 3 business days. We ask that you follow-up with your pharmacy.

## 2019-01-11 NOTE — Telephone Encounter (Signed)
Routing to provider  

## 2019-01-15 ENCOUNTER — Ambulatory Visit
Admission: RE | Admit: 2019-01-15 | Discharge: 2019-01-15 | Disposition: A | Discharge status: Home / Self Care | Payer: Medicare Other | Source: Ambulatory Visit | Attending: Otolaryngology | Admitting: Otolaryngology

## 2019-01-15 ENCOUNTER — Other Ambulatory Visit: Payer: Medicare Other

## 2019-01-15 ENCOUNTER — Other Ambulatory Visit: Payer: Self-pay

## 2019-01-15 ENCOUNTER — Other Ambulatory Visit
Admission: RE | Admit: 2019-01-15 | Discharge: 2019-01-15 | Disposition: A | Discharge status: Home / Self Care | Payer: Medicare Other | Source: Ambulatory Visit | Attending: Urology | Admitting: Urology

## 2019-01-15 ENCOUNTER — Other Ambulatory Visit
Admission: RE | Admit: 2019-01-15 | Discharge: 2019-01-15 | Disposition: A | Discharge status: Home / Self Care | Payer: Medicare Other | Source: Ambulatory Visit | Attending: Otolaryngology | Admitting: Otolaryngology

## 2019-01-15 DIAGNOSIS — C61 Malignant neoplasm of prostate: Secondary | ICD-10-CM

## 2019-01-15 DIAGNOSIS — D380 Neoplasm of uncertain behavior of larynx: Secondary | ICD-10-CM

## 2019-01-15 DIAGNOSIS — J449 Chronic obstructive pulmonary disease, unspecified: Secondary | ICD-10-CM | POA: Insufficient documentation

## 2019-01-15 DIAGNOSIS — Z20828 Contact with and (suspected) exposure to other viral communicable diseases: Secondary | ICD-10-CM | POA: Insufficient documentation

## 2019-01-15 LAB — POCT I-STAT CREATININE: Creatinine, Ser: 0.6 mg/dL — ABNORMAL LOW (ref 0.61–1.24)

## 2019-01-15 LAB — PSA: Prostatic Specific Antigen: 21.03 ng/mL — ABNORMAL HIGH (ref 0.00–4.00)

## 2019-01-15 MED ORDER — IOHEXOL 300 MG/ML  SOLN
75.0000 mL | Freq: Once | INTRAMUSCULAR | Status: AC | PRN
  Administered 2019-01-15: 75 mL via INTRAVENOUS

## 2019-01-16 ENCOUNTER — Other Ambulatory Visit: Payer: Medicare Other | Admitting: Adult Health Nurse Practitioner

## 2019-01-16 ENCOUNTER — Encounter: Payer: Self-pay | Admitting: Otolaryngology

## 2019-01-16 ENCOUNTER — Other Ambulatory Visit: Payer: Self-pay

## 2019-01-16 ENCOUNTER — Telehealth: Payer: Self-pay | Admitting: Adult Health Nurse Practitioner

## 2019-01-16 ENCOUNTER — Encounter
Admission: RE | Admit: 2019-01-16 | Discharge: 2019-01-16 | Disposition: A | Discharge status: Home / Self Care | Payer: Medicare Other | Source: Ambulatory Visit | Attending: Otolaryngology | Admitting: Otolaryngology

## 2019-01-16 LAB — SARS CORONAVIRUS 2 (TAT 6-24 HRS): SARS Coronavirus 2: NEGATIVE

## 2019-01-16 NOTE — Telephone Encounter (Signed)
Called patient for scheduled telehealth visit.  He stated he just found out he has throat cancer and had too much he was going through to talk right now.  Rescheduled visit for 01/30/2019 at 3pm Lonny Eisen K. Olena Heckle NP

## 2019-01-16 NOTE — Patient Instructions (Signed)
INSTRUCTIONS FOR SURGERY     Your surgery is scheduled for:   Wednesday, December 30TH     To find out your arrival time for the day of surgery,          please call [phone] between 1 pm and 3 pm on :  Tuesday, December 29TH    When you arrive for surgery, report to the [facility].       Do NOT stop on the first floor to register.    REMEMBER: Instructions that are not followed completely may result in serious medical risk,  up to and including death, or upon the discretion of your surgeon and anesthesiologist,            your surgery may need to be rescheduled.  __X__ 1. Do not eat food after midnight the night before your procedure.                    No gum, candy, lozenger, tic tacs, tums or hard candies.                  ABSOLUTELY NOTHING SOLID IN YOUR MOUTH AFTER MIDNIGHT                    You may drink unlimited clear liquids up to 2 hours before you are scheduled to arrive for surgery.                   Do not drink anything within those 2 hours unless you need to take medicine, then take the                   smallest amount you need.  Clear liquids include:  water, apple juice without pulp,                   any flavor Gatorade, Black coffee, black tea.  Sugar may be added but no dairy/ honey /lemon.                        Broth and jello is not considered a clear liquid.  __x__  2. On the morning of surgery, please brush your teeth with toothpaste and water. You may rinse with                  mouthwash if you wish but DO NOT SWALLOW TOOTHPASTE OR MOUTHWASH  __X___3. NO alcohol for 24 hours before or after surgery.  __x___ 4.  Do NOT smoke or use e-cigarettes for 24 HOURS PRIOR TO SURGERY.                      DO NOT Use any chewable tobacco products for at least 6 hours prior to surgery.  __x___ 5. If you start any new medication after this appointment and prior to surgery, please           Bring it with you on the day of surgery.  ___x__ 6. Notify your doctor if there is any change in your medical condition, such as fever, infection, vomitting,  Diarrhea or any open sores.  __x___ 7.  USE the CHG SOAP as instructed, the night before surgery and the day of surgery.                   Once you have washed with this soap, do NOT use any of the following: Powders, perfumes                    or lotions. Please do not wear make up, hairpins, clips or nail polish. You may wear deodorant.                   Men may shave their face and neck.  Women need to shave 48 hours prior to surgery.                   DO NOT wear ANY jewelry on the day of surgery. If there are rings that are too tight to                    remove easily, please address this prior to the surgery day. Piercings need to be removed.                                                                     NO METAL ON YOUR BODY.                    Do NOT bring any valuables.  If you came to Pre-Admit testing then you will not need license,                     insurance card or credit card.  If you will be staying overnight, please either leave your things in                     the car or have your family be responsible for these items.                     [facility] IS NOT RESPONSIBLE FOR BELONGINGS OR VALUABLES.  ___X__ 8. DO NOT wear contact lenses on surgery day.  You may not have dentures,                     Hearing aides, contacts or glasses in the operating room. These items can be                    Placed in the Recovery Room to receive immediately after surgery.  __x___ 9. IF YOU ARE SCHEDULED TO GO HOME ON THE SAME DAY, YOU MUST                   Have someone to drive you home and to stay with you  for the first 24 hours.                    Have an arrangement prior to arriving on surgery day.  ___x__ 10. Take the following medications on the morning of surgery with a sip of water:  1. ALBUTEROL INHALER                     2. DUONEB                     3. STIOLTO RESPIMAT                     4. FLONASE                     5. KLONIPIN                     6.  HYDROCODONE , if needed  _____ 11.  Follow any instructions provided to you by your surgeon.                        Such as enema, clear liquid bowel prep  __X__  12. STOP  ASPIRIN AS OF:  Today, December 29th                       THIS INCLUDES BC POWDERS / GOODIES POWDER  __x___ 13. STOP Anti-inflammatories as of:  Today, December 29th                      This includes IBUPROFEN / MOTRIN / ADVIL / ALEVE/ NAPROXYN                    YOU MAY TAKE TYLENOL ANY TIME PRIOR TO SURGERY.  _____ 83.  Stop supplements until after surgery.                     This includes:  N/A                 You may continue taking Vitamin B12 / Vitamin D3 but do not take on the morning of surgery.  ______18. If staying overnight, please have appropriate shoes to wear to be able to walk around the unit.                   Wear clean and comfortable clothing to the hospital.

## 2019-01-16 NOTE — Pre-Procedure Instructions (Signed)
Informed Dr. Randa Lynn of surgery for tomorrow and respiratory status of this patient. She was able to review pulmonary appt notes and agreed that he is a difficult surgical candidate but that he is as optimized as he will be.  Do not need further follow up with pulmonary prior to surgery.

## 2019-01-17 ENCOUNTER — Ambulatory Visit: Payer: Self-pay | Admitting: Pharmacist

## 2019-01-17 ENCOUNTER — Ambulatory Visit
Admission: RE | Admit: 2019-01-17 | Discharge: 2019-01-17 | Disposition: A | Discharge status: Home / Self Care | Payer: Medicare Other | Attending: Otolaryngology | Admitting: Otolaryngology

## 2019-01-17 ENCOUNTER — Ambulatory Visit: Payer: Medicare Other | Admitting: Anesthesiology

## 2019-01-17 ENCOUNTER — Other Ambulatory Visit: Payer: Self-pay

## 2019-01-17 ENCOUNTER — Encounter
Admission: RE | Disposition: A | Discharge status: Home / Self Care | Payer: Self-pay | Source: Home / Self Care | Attending: Otolaryngology

## 2019-01-17 ENCOUNTER — Encounter: Payer: Self-pay | Admitting: Otolaryngology

## 2019-01-17 DIAGNOSIS — G4730 Sleep apnea, unspecified: Secondary | ICD-10-CM | POA: Diagnosis not present

## 2019-01-17 DIAGNOSIS — D380 Neoplasm of uncertain behavior of larynx: Secondary | ICD-10-CM | POA: Diagnosis present

## 2019-01-17 DIAGNOSIS — Z79899 Other long term (current) drug therapy: Secondary | ICD-10-CM | POA: Insufficient documentation

## 2019-01-17 DIAGNOSIS — F17210 Nicotine dependence, cigarettes, uncomplicated: Secondary | ICD-10-CM | POA: Insufficient documentation

## 2019-01-17 DIAGNOSIS — Z9981 Dependence on supplemental oxygen: Secondary | ICD-10-CM | POA: Diagnosis not present

## 2019-01-17 DIAGNOSIS — C321 Malignant neoplasm of supraglottis: Secondary | ICD-10-CM | POA: Insufficient documentation

## 2019-01-17 DIAGNOSIS — Z85828 Personal history of other malignant neoplasm of skin: Secondary | ICD-10-CM | POA: Diagnosis not present

## 2019-01-17 DIAGNOSIS — F411 Generalized anxiety disorder: Secondary | ICD-10-CM | POA: Diagnosis not present

## 2019-01-17 DIAGNOSIS — I2510 Atherosclerotic heart disease of native coronary artery without angina pectoris: Secondary | ICD-10-CM | POA: Insufficient documentation

## 2019-01-17 DIAGNOSIS — Z8546 Personal history of malignant neoplasm of prostate: Secondary | ICD-10-CM | POA: Diagnosis not present

## 2019-01-17 DIAGNOSIS — H905 Unspecified sensorineural hearing loss: Secondary | ICD-10-CM | POA: Insufficient documentation

## 2019-01-17 DIAGNOSIS — I739 Peripheral vascular disease, unspecified: Secondary | ICD-10-CM | POA: Insufficient documentation

## 2019-01-17 DIAGNOSIS — I252 Old myocardial infarction: Secondary | ICD-10-CM | POA: Diagnosis not present

## 2019-01-17 DIAGNOSIS — J449 Chronic obstructive pulmonary disease, unspecified: Secondary | ICD-10-CM | POA: Insufficient documentation

## 2019-01-17 HISTORY — DX: Headache, unspecified: R51.9

## 2019-01-17 HISTORY — PX: MICROLARYNGOSCOPY: SHX5208

## 2019-01-17 HISTORY — DX: Long term (current) use of other agents affecting estrogen receptors and estrogen levels: Z79.818

## 2019-01-17 HISTORY — DX: Anxiety disorder, unspecified: F41.9

## 2019-01-17 HISTORY — DX: Dysphonia: R49.0

## 2019-01-17 HISTORY — DX: Male erectile dysfunction, unspecified: N52.9

## 2019-01-17 HISTORY — DX: Nicotine dependence, cigarettes, uncomplicated: F17.210

## 2019-01-17 HISTORY — DX: Sleep apnea, unspecified: G47.30

## 2019-01-17 HISTORY — DX: Dyspnea, unspecified: R06.00

## 2019-01-17 HISTORY — DX: Dysphagia, unspecified: R13.10

## 2019-01-17 HISTORY — DX: Acute respiratory failure, unspecified whether with hypoxia or hypercapnia: J96.00

## 2019-01-17 HISTORY — DX: Dental caries, unspecified: K02.9

## 2019-01-17 HISTORY — DX: Acute myocardial infarction, unspecified: I21.9

## 2019-01-17 SURGERY — MICROLARYNGOSCOPY
Anesthesia: General

## 2019-01-17 MED ORDER — MIDAZOLAM HCL 2 MG/2ML IJ SOLN
INTRAMUSCULAR | Status: AC
  Filled 2019-01-17: qty 2

## 2019-01-17 MED ORDER — FENTANYL CITRATE (PF) 100 MCG/2ML IJ SOLN
25.0000 ug | INTRAMUSCULAR | Status: DC | PRN
  Administered 2019-01-17: 25 ug via INTRAVENOUS

## 2019-01-17 MED ORDER — LIDOCAINE HCL (CARDIAC) PF 100 MG/5ML IV SOSY
PREFILLED_SYRINGE | INTRAVENOUS | Status: DC | PRN
  Administered 2019-01-17: 50 mg via INTRATRACHEAL

## 2019-01-17 MED ORDER — LIDOCAINE HCL (PF) 2 % IJ SOLN
INTRAMUSCULAR | Status: AC
  Filled 2019-01-17: qty 10

## 2019-01-17 MED ORDER — SUGAMMADEX SODIUM 200 MG/2ML IV SOLN
INTRAVENOUS | Status: AC
  Filled 2019-01-17: qty 2

## 2019-01-17 MED ORDER — ONDANSETRON HCL 4 MG/2ML IJ SOLN
INTRAMUSCULAR | Status: DC | PRN
  Administered 2019-01-17: 4 mg via INTRAVENOUS

## 2019-01-17 MED ORDER — MIDAZOLAM HCL 2 MG/2ML IJ SOLN
INTRAMUSCULAR | Status: DC | PRN
  Administered 2019-01-17: 2 mg via INTRAVENOUS

## 2019-01-17 MED ORDER — ONDANSETRON HCL 4 MG/2ML IJ SOLN
INTRAMUSCULAR | Status: AC
  Filled 2019-01-17: qty 2

## 2019-01-17 MED ORDER — LACTATED RINGERS IV SOLN: INTRAVENOUS | Status: DC

## 2019-01-17 MED ORDER — OXYCODONE-ACETAMINOPHEN 5-325 MG PO TABS: 1.0000 | ORAL_TABLET | Freq: Four times a day (QID) | ORAL | 0 refills | Status: DC | PRN

## 2019-01-17 MED ORDER — FENTANYL CITRATE (PF) 100 MCG/2ML IJ SOLN
INTRAMUSCULAR | Status: AC
  Administered 2019-01-17: 25 ug via INTRAVENOUS
  Filled 2019-01-17: qty 2

## 2019-01-17 MED ORDER — FAMOTIDINE 20 MG PO TABS
ORAL_TABLET | ORAL | Status: AC
  Administered 2019-01-17: 20 mg via ORAL
  Filled 2019-01-17: qty 1

## 2019-01-17 MED ORDER — ROCURONIUM BROMIDE 50 MG/5ML IV SOLN
INTRAVENOUS | Status: AC
  Filled 2019-01-17: qty 1

## 2019-01-17 MED ORDER — DEXAMETHASONE SODIUM PHOSPHATE 10 MG/ML IJ SOLN
INTRAMUSCULAR | Status: AC
  Filled 2019-01-17: qty 1

## 2019-01-17 MED ORDER — DEXAMETHASONE SODIUM PHOSPHATE 10 MG/ML IJ SOLN
INTRAMUSCULAR | Status: DC | PRN
  Administered 2019-01-17: 10 mg via INTRAVENOUS

## 2019-01-17 MED ORDER — LIDOCAINE-EPINEPHRINE 1 %-1:100000 IJ SOLN
INTRAMUSCULAR | Status: AC
  Filled 2019-01-17: qty 1

## 2019-01-17 MED ORDER — PROPOFOL 10 MG/ML IV BOLUS
INTRAVENOUS | Status: AC
  Filled 2019-01-17: qty 20

## 2019-01-17 MED ORDER — ROCURONIUM BROMIDE 100 MG/10ML IV SOLN
INTRAVENOUS | Status: DC | PRN
  Administered 2019-01-17: 30 mg via INTRAVENOUS

## 2019-01-17 MED ORDER — FENTANYL CITRATE (PF) 250 MCG/5ML IJ SOLN
INTRAMUSCULAR | Status: DC | PRN
  Administered 2019-01-17: 25 ug via INTRAVENOUS

## 2019-01-17 MED ORDER — PROPOFOL 10 MG/ML IV BOLUS
INTRAVENOUS | Status: DC | PRN
  Administered 2019-01-17: 70 mg via INTRAVENOUS

## 2019-01-17 MED ORDER — OXYMETAZOLINE HCL 0.05 % NA SOLN
NASAL | Status: DC | PRN
  Administered 2019-01-17: 1 via TOPICAL

## 2019-01-17 MED ORDER — OXYMETAZOLINE HCL 0.05 % NA SOLN
NASAL | Status: AC
  Filled 2019-01-17: qty 30

## 2019-01-17 MED ORDER — FAMOTIDINE 20 MG PO TABS: 20.0000 mg | ORAL_TABLET | Freq: Once | ORAL | Status: AC

## 2019-01-17 MED ORDER — SUGAMMADEX SODIUM 200 MG/2ML IV SOLN
INTRAVENOUS | Status: DC | PRN
  Administered 2019-01-17: 100 mg via INTRAVENOUS

## 2019-01-17 MED ORDER — ONDANSETRON HCL 4 MG/2ML IJ SOLN: 4.0000 mg | Freq: Once | INTRAMUSCULAR | Status: DC | PRN

## 2019-01-17 MED ORDER — FENTANYL CITRATE (PF) 100 MCG/2ML IJ SOLN
INTRAMUSCULAR | Status: AC
  Filled 2019-01-17: qty 2

## 2019-01-17 MED ORDER — LIDOCAINE HCL 4 % MT SOLN
OROMUCOSAL | Status: DC | PRN
  Administered 2019-01-17: 4 mL via TOPICAL

## 2019-01-17 SURGICAL SUPPLY — 15 items
COVER WAND RF STERILE (DRAPES) ×3 IMPLANT
DRSG TELFA 4X3 1S NADH ST (GAUZE/BANDAGES/DRESSINGS) ×3 IMPLANT
GLOVE BIO SURGEON STRL SZ7.5 (GLOVE) ×6 IMPLANT
GOWN STRL REUS W/ TWL LRG LVL3 (GOWN DISPOSABLE) ×2 IMPLANT
GOWN STRL REUS W/TWL LRG LVL3 (GOWN DISPOSABLE) ×4
IV SET EXTENSION MINI BORE EPI (IV SETS) IMPLANT
KIT TURNOVER KIT A (KITS) ×3 IMPLANT
LABEL OR SOLS (LABEL) ×3 IMPLANT
NDL ENDOSCOPIC URO 20G (NEEDLE) IMPLANT
NEEDLE FILTER BLUNT 18X 1/2SAF (NEEDLE) ×2
NEEDLE FILTER BLUNT 18X1 1/2 (NEEDLE) ×1 IMPLANT
PACK HEAD/NECK (MISCELLANEOUS) ×3 IMPLANT
PATTIES SURGICAL .5 X.5 (GAUZE/BANDAGES/DRESSINGS) ×3 IMPLANT
SOL ANTI-FOG 6CC FOG-OUT (MISCELLANEOUS) ×1 IMPLANT
SOL FOG-OUT ANTI-FOG 6CC (MISCELLANEOUS) ×2

## 2019-01-17 NOTE — OR Nursing (Signed)
S Fraiser CRNA in to see patient and assess patients's lip.  Area noted on top lip with some swelling, skin intact.  Okay to discharge and patient tolerating po fluids well.

## 2019-01-17 NOTE — Discharge Instructions (Signed)
AMBULATORY SURGERY  DISCHARGE INSTRUCTIONS   1) The drugs that you were given will stay in your system until tomorrow so for the next 24 hours you should not:  A) Drive an automobile B) Make any legal decisions C) Drink any alcoholic beverage   2) Follow instructions given my MD for diet.    You may eat anything you prefer, but it is better to start with liquids, then soup and crackers, and gradually work up to solid foods.   3) Please notify your doctor immediately if you have any unusual bleeding, trouble breathing, redness and pain at the surgery site, drainage, fever, or pain not relieved by medication.    4) Additional Instructions: Refrain from smoking        Please contact your physician with any problems or Same Day Surgery at [phone], Monday through Friday 6 am to 4 pm, or [place] at [facility] Building number at [phone].

## 2019-01-17 NOTE — Anesthesia Preprocedure Evaluation (Signed)
Anesthesia Evaluation  Patient identified by MRN, date of birth, ID band Patient awake    Reviewed: Allergy & Precautions, NPO status , Patient's Chart, lab work & pertinent test results, reviewed documented beta blocker date and time   Airway Mallampati: II  TM Distance: >3 FB     Dental  (+) Chipped   Pulmonary shortness of breath, sleep apnea , COPD, Current Smoker,           Cardiovascular + CAD, + Past MI and + Peripheral Vascular Disease       Neuro/Psych  Headaches, PSYCHIATRIC DISORDERS Anxiety    GI/Hepatic (+) Hepatitis -  Endo/Other    Renal/GU      Musculoskeletal   Abdominal   Peds  Hematology   Anesthesia Other Findings Laryngeal ca. Lung nodules. Vocal cord mass.End stage COPD. Smokes.  Reproductive/Obstetrics                             Anesthesia Physical Anesthesia Plan  ASA: IV  Anesthesia Plan: General   Post-op Pain Management:    Induction: Intravenous  PONV Risk Score and Plan:   Airway Management Planned: Oral ETT  Additional Equipment:   Intra-op Plan:   Post-operative Plan:   Informed Consent: I have reviewed the patients History and Physical, chart, labs and discussed the procedure including the risks, benefits and alternatives for the proposed anesthesia with the patient or authorized representative who has indicated his/her understanding and acceptance.       Plan Discussed with: CRNA  Anesthesia Plan Comments:         Anesthesia Quick Evaluation

## 2019-01-17 NOTE — Op Note (Signed)
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 TVC free of gross tumor. Hypopharynx and tongue base are clear.  Procedure: With the patient in a comfortable supine position, general endotracheal anesthesia was induced without difficulty.  At an appropriate level, the table was turned 90 degrees away from Anesthesia.  A clean preparation and draping was performed in the standard fashion.  A Raytec was placed over the upper gums for protection. Using the Dedo laryngoscope, the oropharynx, hypopharynx and larynx were carefully inspected. Photo-documentation was obtained with the 0 degree scope.  Biopsies were taken from the left false vocal cord. Bleeding was controlled with Afrin moistened pledgets.  The findings were as described above.  The laryngoscope was removed. The tongue base was palpated and was free of masses.  The neck was palpated on both sides with the findings as described above.  At this point the procedure was completed.  Raytec was removed and gingiva intact.  The patient was returned to Anesthesia, awakened, extubated, and transferred to PACU in satisfactory condition.   Disposition: To PACU, then discharge home  Plan: Soft, bland diet, advance as tolerated. Take pain medications as prescribed. Follow-up to be determined after biopsy results are available.  Riley Nearing 01/17/2019 8:45 AM

## 2019-01-17 NOTE — Transfer of Care (Signed)
Immediate Anesthesia Transfer of Care Note  Patient: Kevin Nelson  Procedure(s) Performed: MICROLARYNGOSCOPY WITH BX (N/A )  Patient Location: PACU  Anesthesia Type:General  Level of Consciousness: awake, alert , oriented and patient cooperative  Airway & Oxygen Therapy: Patient Spontanous Breathing and Patient connected to nasal cannula oxygen  Post-op Assessment: Report given to RN and Post -op Vital signs reviewed and stable  Post vital signs: Reviewed and stable  Last Vitals:  Vitals Value Taken Time  BP 124/69 01/17/19 0851  Temp    Pulse 91 01/17/19 0852  Resp 18 01/17/19 0852  SpO2 100 % 01/17/19 0852  Vitals shown include unvalidated device data.  Last Pain:  Vitals:   01/17/19 0741  TempSrc: Temporal  PainSc: 9       Patients Stated Pain Goal: 0 ([id number])  Complications: No apparent anesthesia complications

## 2019-01-17 NOTE — Progress Notes (Signed)
Error

## 2019-01-17 NOTE — Anesthesia Postprocedure Evaluation (Signed)
Anesthesia Post Note  Patient: Kevin Nelson  Procedure(s) Performed: MICROLARYNGOSCOPY WITH BX (N/A )  Patient location during evaluation: PACU Anesthesia Type: General Level of consciousness: awake and alert Pain management: pain level controlled Vital Signs Assessment: post-procedure vital signs reviewed and stable Respiratory status: spontaneous breathing, nonlabored ventilation, respiratory function stable and patient connected to nasal cannula oxygen Cardiovascular status: blood pressure returned to baseline and stable Postop Assessment: no apparent nausea or vomiting Anesthetic complications: no     Last Vitals:  Vitals:   01/17/19 0943 01/17/19 1007  BP: 130/67 136/71  Pulse: 78 78  Resp: 18 16  Temp: 36.5 C   SpO2: 100% 100%    Last Pain:  Vitals:   01/17/19 1007  TempSrc:   PainSc: 0-No pain                 Ashe Gago S

## 2019-01-17 NOTE — Anesthesia Procedure Notes (Signed)
Procedure Name: Intubation Date/Time: 01/17/2019 8:23 AM Performed by: Eben Burow, CRNA Pre-anesthesia Checklist: Patient identified, Emergency Drugs available, Suction available and Patient being monitored Patient Re-evaluated:Patient Re-evaluated prior to induction Oxygen Delivery Method: Circle system utilized Preoxygenation: Pre-oxygenation with 100% oxygen Induction Type: IV induction Ventilation: Mask ventilation without difficulty Laryngoscope Size: McGraph and 3 Grade View: Grade I Tube type: Oral Tube size: 6.0 mm Number of attempts: 1 Airway Equipment and Method: Stylet,  Video-laryngoscopy and LTA kit utilized Placement Confirmation: ETT inserted through vocal cords under direct vision,  positive ETCO2 and breath sounds checked- equal and bilateral Secured at: 20 cm Tube secured with: Tape Dental Injury: Teeth and Oropharynx as per pre-operative assessment

## 2019-01-17 NOTE — Anesthesia Post-op Follow-up Note (Signed)
Anesthesia QCDR form completed.        

## 2019-01-18 ENCOUNTER — Other Ambulatory Visit: Payer: Self-pay | Admitting: Pathology

## 2019-01-18 LAB — SURGICAL PATHOLOGY

## 2019-01-22 ENCOUNTER — Telehealth: Payer: Self-pay | Admitting: Gastroenterology

## 2019-01-22 ENCOUNTER — Ambulatory Visit: Payer: Medicare Other | Admitting: Gastroenterology

## 2019-01-22 NOTE — Telephone Encounter (Signed)
Pt left vm he states he needs to cancel his apt for today due to being diagnosed with [place] Cancer by  Dr. Richardson [facility], he is waiting on Oncology on his treatment plan.

## 2019-01-23 ENCOUNTER — Inpatient Hospital Stay: Payer: Medicare Other | Attending: Oncology | Admitting: Oncology

## 2019-01-23 ENCOUNTER — Other Ambulatory Visit: Payer: Self-pay

## 2019-01-23 ENCOUNTER — Encounter: Payer: Self-pay | Admitting: Oncology

## 2019-01-23 ENCOUNTER — Other Ambulatory Visit: Payer: Self-pay | Admitting: *Deleted

## 2019-01-23 VITALS — BP 102/61 | HR 90 | Temp 96.8°F | Ht 66.0 in | Wt 117.0 lb

## 2019-01-23 DIAGNOSIS — F17210 Nicotine dependence, cigarettes, uncomplicated: Secondary | ICD-10-CM | POA: Diagnosis not present

## 2019-01-23 DIAGNOSIS — Z7189 Other specified counseling: Secondary | ICD-10-CM

## 2019-01-23 DIAGNOSIS — I252 Old myocardial infarction: Secondary | ICD-10-CM

## 2019-01-23 DIAGNOSIS — C329 Malignant neoplasm of larynx, unspecified: Secondary | ICD-10-CM

## 2019-01-23 DIAGNOSIS — Z8546 Personal history of malignant neoplasm of prostate: Secondary | ICD-10-CM | POA: Diagnosis not present

## 2019-01-23 DIAGNOSIS — C321 Malignant neoplasm of supraglottis: Secondary | ICD-10-CM | POA: Insufficient documentation

## 2019-01-23 DIAGNOSIS — G893 Neoplasm related pain (acute) (chronic): Secondary | ICD-10-CM | POA: Insufficient documentation

## 2019-01-23 DIAGNOSIS — Z85828 Personal history of other malignant neoplasm of skin: Secondary | ICD-10-CM | POA: Diagnosis not present

## 2019-01-23 MED ORDER — ONDANSETRON HCL 4 MG PO TABS: 4.0000 mg | ORAL_TABLET | Freq: Three times a day (TID) | ORAL | 0 refills | Status: DC | PRN

## 2019-01-23 MED ORDER — OXYCODONE HCL 10 MG PO TABS: 10.0000 mg | ORAL_TABLET | ORAL | 0 refills | Status: DC | PRN

## 2019-01-23 NOTE — Progress Notes (Signed)
Patient is here today to establish care for neoplasm of larynx. Patient stated that he's been having throat pain for the past three years and that nobody had checked until recently.

## 2019-01-24 ENCOUNTER — Telehealth: Payer: Self-pay | Admitting: Nurse Practitioner

## 2019-01-24 ENCOUNTER — Telehealth: Payer: Self-pay

## 2019-01-24 NOTE — Telephone Encounter (Signed)
Kevin Nelson, with Hospice, states they have received a referral for hospice for patient but inquired if it were actually supposed to be for palliative care.

## 2019-01-24 NOTE — Telephone Encounter (Signed)
On review this order was for palliative at this time and may change in future depending on how he does upcoming.

## 2019-01-24 NOTE — Telephone Encounter (Signed)
Routing to provider. Was this supposed to be for Hospice or Pallative Care?

## 2019-01-24 NOTE — Telephone Encounter (Signed)
Called [facility]. Referral needs to be resent for Pallative and not Hospice.

## 2019-01-25 ENCOUNTER — Other Ambulatory Visit: Payer: Medicare Other

## 2019-01-25 ENCOUNTER — Other Ambulatory Visit: Payer: Self-pay

## 2019-01-25 ENCOUNTER — Encounter: Payer: Self-pay | Admitting: Oncology

## 2019-01-25 DIAGNOSIS — C329 Malignant neoplasm of larynx, unspecified: Secondary | ICD-10-CM

## 2019-01-25 DIAGNOSIS — Z7189 Other specified counseling: Secondary | ICD-10-CM | POA: Insufficient documentation

## 2019-01-25 NOTE — Progress Notes (Signed)
Tumor Board Documentation  Kevin Nelson was presented by Dr Richardson Landry and Dr Janese Banks at our Tumor Board on 01/25/2019, which included representatives from medical oncology, radiation oncology, navigation, pathology, radiology, surgical, surgical oncology, internal medicine, pharmacy, genetics, palliative care, research.  Kevin Nelson currently presents as a new patient, for [place], for new positive pathology with history of the following treatments: active survellience, surgical intervention(s).  Additionally, we reviewed previous medical and familial history, history of present illness, and recent lab results along with all available histopathologic and imaging studies. The tumor board considered available treatment options and made the following recommendations: Additional screening MRI of neck Concurrent chemo Rad vs Surgery  The following procedures/referrals were also placed: No orders of the defined types were placed in this encounter.   Clinical Trial Status: not discussed   Staging used: Pathologic Stage  AJCC Staging:       Group: Squamous Cell Laryngeal Cancer   National site-specific guidelines   were discussed with respect to the case.  Tumor board is a meeting of clinicians from various specialty areas who evaluate and discuss patients for whom a multidisciplinary approach is being considered. Final determinations in the plan of care are those of the provider(s). The responsibility for follow up of recommendations given during tumor board is that of the provider.   Today's extended care, comprehensive team conference, Zyren was not present for the discussion and was not examined.   Multidisciplinary Tumor Board is a multidisciplinary case peer review process.  Decisions discussed in the Multidisciplinary Tumor Board reflect the opinions of the specialists present at the conference without having examined the patient.  Ultimately, treatment and diagnostic decisions rest with the primary  provider(s) and the patient.

## 2019-01-25 NOTE — Progress Notes (Signed)
Hematology/Oncology Consult note [facility] Telephone:([phone] [phone]  Patient Care Team: [name], NP as PCP - General (Nurse Practitioner) Minor, Dalbert Garnet, RN as Case Manager Greg Cutter, LCSW as Social Worker (Licensed Clinical Social Worker) De Hollingshead, [facility] as Pharmacist (Pharmacist)   Name of the patient: Kevin Nelson  [id number]  1950-03-24    Reason for referral-squamous cell carcinoma of supraglottic larynx   Referring physician- Dr. Richardson Landry  Date of visit: 01/25/19   History of presenting illness-patient is a 69 year old male with a past medical history significant for tobacco dependence, COPD on home oxygen who presented to Dr. Richardson Landry with symptoms of difficulty swallowing and persistent sore throat which has been ongoing for the last 1 year.  NPL exam showed a friable mass involving the false vocal cord but did not involve the true vocal cords.  True vocal cords remain mobile on exam.  Hypopharynx and tongue base was clear.    This was followed by CT soft tissue neck which showed a 3.3 x 1.7 x 3.5 cm mass which was thought to be involving both the true and false vocal cords and extending anteriorly to involve the anterior commissure.  Mass extends to involve the paraglottic space and erodes through the thyroid cartilage anteriorly and left of midline.  There is also invasion of the preepiglottic space inferiorly.  Enlarged cystic necrotic level 3/4 lymph node measuring 1.6 x 1.2 cm.  Findings are consistent with nodal metastatic disease.  Biopsy of the mass showed squamous cell carcinoma.  Patient referred to medical oncology for further recommendations.  Currently patient reports significant pain in his throat which makes it difficult for him to swallow his saliva.  He has been taking as needed Percocet which has not been helping him with his pain.  Reports having issues with bowel movements and being constipated.[name]    ECOG PS- 1  Pain scale- 4   Review of systems- Review of Systems  Constitutional: Positive for malaise/fatigue and weight loss. Negative for chills and fever.  HENT: Positive for sore throat. Negative for congestion, ear discharge and nosebleeds.   Eyes: Negative for blurred vision.  Respiratory: Negative for cough, hemoptysis, sputum production, shortness of breath and wheezing.   Cardiovascular: Negative for chest pain, palpitations, orthopnea and claudication.  Gastrointestinal: Negative for abdominal pain, blood in stool, constipation, diarrhea, heartburn, melena, nausea and vomiting.       Difficulty swallowing  Genitourinary: Negative for dysuria, flank pain, frequency, hematuria and urgency.  Musculoskeletal: Negative for back pain, joint pain and myalgias.  Skin: Negative for rash.  Neurological: Negative for dizziness, tingling, focal weakness, seizures, weakness and headaches.  Endo/Heme/Allergies: Does not bruise/bleed easily.  Psychiatric/Behavioral: Negative for depression and suicidal ideas. The patient does not have insomnia.     No Known Allergies  Patient Active Problem List   Diagnosis Date Noted  . Laryngeal cancer ([place]) 01/04/2019  . Sore throat 12/21/2018  . Numbness of left foot 12/21/2018  . Chronic nonintractable headache 11/10/2018  . COPD (chronic obstructive pulmonary disease) ([place]) 09/30/2018  . Sensorineural hearing loss (SNHL) 08/21/2018  . Coronary atherosclerosis 07/31/2018  . Chronic hepatitis ([place]) 04/21/2018  . Lung nodule < 6cm on CT 04/19/2018  . Senile purpura ([place]) 04/19/2018  . Protein-calorie malnutrition ([place]) 04/19/2018  . Nicotine dependence, cigarettes, uncomplicated [id number]  . Controlled substance agreement signed 01/27/2018  . COPD, very severe ([place]) 01/13/2018  . Prostate cancer ([place]) 01/13/2018  . History of basal  cell carcinoma (BCC) 01/13/2018  . Generalized anxiety disorder 01/13/2018     Past Medical History:   Diagnosis Date  . Anxiety   . Basal cell carcinoma   . Cigarette smoker   . Dental decay   . Dysphagia   . Dyspnea   . End stage COPD ([place])    end stage copd, emphysema  . Erectile dysfunction   . Headache    every morning  . Hepatitis C    treated for 8 weeks this past year with Mayvret  . History of nonmelanoma skin cancer   . Hoarseness, chronic   . Myocardial infarction [facility]) march/april 2020   mild heart attack  . Prostate cancer ([place])   . Respiratory failure, acute ([place])    was inpt for 5 days. thought he was going to die.  . Sleep apnea   . Squamous cell skin cancer   . Throat cancer ([place])   . Use of leuprolide acetate (Lupron)    treated for prostate cancer.   . 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     Past Surgical History:  Procedure Laterality Date  . FACIAL LACERATION REPAIR    . HERNIA REPAIR Left 1992   inguinal  . MICROLARYNGOSCOPY N/A 01/17/2019   Procedure: MICROLARYNGOSCOPY WITH BX;  Surgeon: Clyde Canterbury, MD;  Location: [facility] ORS;  Service: ENT;  Laterality: N/A;  . PROSTATE BIOPSY    . SKIN CANCER EXCISION    . TRIGGER FINGER RELEASE Left 10/2018    Social History   Socioeconomic History  . Marital status: Significant Other    Spouse name: [name], girlfriend  . Number of children: Not on file  . Years of education: Not on file  . Highest education level: Not on file  Occupational History  . Occupation: Chief Strategy Officer    Comment: retired  Tobacco Use  . Smoking status: Current Every Day Smoker    Packs/day: 0.50    Years: 52.00    Pack years: 26.00    Types: Cigarettes  . Smokeless tobacco: Never Used  . Tobacco comment: Currently smoking 8-9 cigarettes/day at his peak was smoking 2 packs/day until 14 April 2018  Substance and Sexual Activity  . Alcohol use: Yes    Alcohol/week: 21.0 standard drinks    Types: 21 Cans of beer per week    Comment: 3 a day   . Drug use: Never  . Sexual activity: Yes  Other Topics  Concern  . Not on file  Social History Narrative   Patient has retired d/t respiratory status. Currently lives with girlfriend    Social Determinants of Health   Financial Resource Strain: Low Risk   . Difficulty of Paying Living Expenses: Not hard at all  Food Insecurity:   . Worried About Charity fundraiser in the Last Year: Not on file  . Ran Out of Food in the Last Year: Not on file  Transportation Needs:   . Lack of Transportation (Medical): Not on file  . Lack of Transportation (Non-Medical): Not on file  Physical Activity:   . Days of Exercise per Week: Not on file  . Minutes of Exercise per Session: Not on file  Stress:   . Feeling of Stress : Not on file  Social Connections:   . Frequency of Communication with Friends and Family: Not on file  . Frequency of Social Gatherings with Friends and Family: Not on file  . Attends Religious Services: Not on file  .  Active Member of Clubs or Organizations: Not on file  . Attends Archivist Meetings: Not on file  . Marital Status: Not on file  Intimate Partner Violence:   . Fear of Current or Ex-Partner: Not on file  . Emotionally Abused: Not on file  . Physically Abused: Not on file  . Sexually Abused: Not on file     Family History  Problem Relation Age of Onset  . Cancer Sister   . Cancer - Cervical Mother   . Heart attack Brother   . Heart attack Maternal Grandfather      Current Outpatient Medications:  .  clonazePAM (KLONOPIN) 0.5 MG tablet, Take 1 tablet (0.5 mg total) by mouth 2 (two) times daily as needed. for anxiety (Patient taking differently: Take 0.5 mg by mouth 2 (two) times daily. ), Disp: 60 tablet, Rfl: 2 .  COMBIVENT RESPIMAT 20-100 MCG/ACT AERS respimat, INHALE 1 PUFF BY MOUTH INTO THE LUNGS EVERY 6 HOURS AS NEEDED, Disp: 4 g, Rfl: 2 .  fluticasone (FLONASE) 50 MCG/ACT nasal spray, 1 spray into each nostril twice a day (Patient taking differently: Place 1 spray into both nostrils 2 (two)  times daily as needed for allergies or rhinitis. ), Disp: 16 g, Rfl: 2 .  ipratropium-albuterol (DUONEB) 0.5-2.5 (3) MG/3ML SOLN, Take 3 mLs by nebulization every 6 (six) hours as needed. (Patient taking differently: Take 3 mLs by nebulization every 6 (six) hours as needed (shortness of breath). ), Disp: 360 mL, Rfl: 11 .  lidocaine (XYLOCAINE) 2 % solution, Use as directed 15 mLs in the mouth or throat every 6 (six) hours as needed for mouth pain (for sore throat)., Disp: 100 mL, Rfl: 2 .  oxyCODONE-acetaminophen (PERCOCET/ROXICET) 5-325 MG tablet, Take 1-2 tablets by mouth every 6 (six) hours as needed for severe pain., Disp: 40 tablet, Rfl: 0 .  OXYGEN, Inhale 3 L into the lungs daily., Disp: , Rfl:  .  STIOLTO RESPIMAT 2.5-2.5 MCG/ACT AERS, Inhale 2 puffs into the lungs 2 (two) times daily as needed (shortness of breath). , Disp: , Rfl:  .  ondansetron (ZOFRAN) 4 MG tablet, Take 1 tablet (4 mg total) by mouth every 8 (eight) hours as needed for nausea or vomiting., Disp: 30 tablet, Rfl: 0 .  Oxycodone HCl 10 MG TABS, Take 1 tablet (10 mg total) by mouth every 4 (four) hours as needed., Disp: 60 tablet, Rfl: 0   Physical exam:  Vitals:   01/23/19 1058  BP: 102/61  Pulse: 90  Temp: (!) 96.8 F (36 C)  TempSrc: Tympanic  SpO2: 100%  Weight: 117 lb (53.1 kg)  Height: 5\' 6"  (1.676 m)   Physical Exam Constitutional:      General: He is not in acute distress. HENT:     Head: Normocephalic and atraumatic.     Mouth/Throat:     Mouth: Mucous membranes are moist.     Pharynx: Oropharynx is clear.  Eyes:     Pupils: Pupils are equal, round, and reactive to light.  Cardiovascular:     Rate and Rhythm: Normal rate and regular rhythm.     Heart sounds: Normal heart sounds.  Pulmonary:     Effort: Pulmonary effort is normal.     Breath sounds: Normal breath sounds.  Abdominal:     General: Bowel sounds are normal.     Palpations: Abdomen is soft.  Musculoskeletal:     Cervical back:  Normal range of motion.  Lymphadenopathy:  Comments: No palpable cervical adenopathy  Skin:    General: Skin is warm and dry.  Neurological:     Mental Status: He is alert and oriented to person, place, and time.        CMP Latest Ref Rng & Units 01/15/2019  Glucose 65 - 99 mg/dL -  BUN 8 - 27 mg/dL -  Creatinine 0.61 - 1.24 mg/dL 0.60(L)  Sodium 134 - 144 mmol/L -  Potassium 3.5 - 5.2 mmol/L -  Chloride 96 - 106 mmol/L -  CO2 20 - 29 mmol/L -  Calcium 8.6 - 10.2 mg/dL -  Total Protein 6.0 - 8.5 g/dL -  Total Bilirubin 0.0 - 1.2 mg/dL -  Alkaline Phos 39 - 117 IU/L -  AST 0 - 40 IU/L -  ALT 0 - 44 IU/L -   CBC Latest Ref Rng & Units 10/24/2018  WBC 3.4 - 10.8 x10E3/uL 6.4  Hemoglobin 13.0 - 17.7 g/dL 13.5  Hematocrit 37.5 - 51.0 % 41.0  Platelets 150 - 450 x10E3/uL 364    No images are attached to the encounter.  DG Chest 2 View  Result Date: 01/15/2019 CLINICAL DATA:  Laryngeal cancer.  COPD. EXAM: CHEST - 2 VIEW COMPARISON:  09/29/2018 FINDINGS: The cardiomediastinal silhouette is unchanged with normal heart size. There is chronic pulmonary arterial enlargement. The lungs remain hyperinflated with underlying emphysema and no evidence of airspace consolidation, edema, pleural effusion, or pneumothorax. No acute osseous abnormality is identified. IMPRESSION: COPD without evidence of acute cardiopulmonary disease. Electronically Signed   By: Logan Bores M.D.   On: 01/15/2019 10:33   CT SOFT TISSUE NECK W CONTRAST  Result Date: 01/15/2019 CLINICAL DATA:  Laryngeal cancer, throat pain for 3 years. EXAM: CT NECK WITH CONTRAST TECHNIQUE: Multidetector CT imaging of the neck was performed using the standard protocol following the bolus administration of intravenous contrast. CONTRAST:  7mL OMNIPAQUE IOHEXOL 300 MG/ML  SOLN COMPARISON:  Chest CT 06/19/2018 FINDINGS: Pharynx and larynx: No appreciable mass or swelling within the nasopharynx, oral cavity or oropharynx. There is  an irregular laryngeal mass involving the true and false vocal cords (greater on the left) also extending anteriorly to involve the anterior commissure. The mass extends to involve the paraglottic space and erodes through the thyroid cartilage anteriorly and left of midline. There is also invasion of the pre epiglottic space inferiorly, as well as some supraglottic extension on the left. The mass measures approximately 3.3 x 1.7 x 3.5 cm (AP x TV x CC) (series 2, image 67) (series 5, image 43). Associated moderate narrowing of the laryngeal airway. Salivary glands: No evidence of inflammation, mass or stone. Thyroid: Negative Lymph nodes: There is enlarged, cystic/necrotic left level III/VA lymph node measuring 1.6 x 1.2 cm (series 2, image 70). Findings are consistent with nodal metastatic disease. No other pathologically enlarged or suspicious cervical lymph nodes are identified. Vascular: The major vascular structures of the neck appear patent. Atherosclerotic disease. Limited intracranial: No abnormality identified. Visualized orbits: Visualized orbits demonstrate no acute abnormality. Mastoids and visualized paranasal sinuses: Minimal partial opacification of right ethmoid air cells. Air-fluid level and frothy secretions within the right sphenoid sinus. No significant mastoid effusion. Skeleton: No acute bony abnormality or suspicious osseous lesion. Upper chest: Emphysema within the imaged lung apices. IMPRESSION: Laryngeal mass measuring approximately 3.3 x 1.7 x 3.5 cm as detailed. The mass involves the bilateral true and false vocal cords (greater on the left). Additionally, the mass extends to involve anterior commissure, paraglottic  space and erodes through the thyroid cartilage anteriorly and left of midline. There is also invasion of the pre epiglottic space inferiorly, as well as some supraglottic extension of the tumor on the left. Associated moderate narrowing of the laryngeal airway. 1.6 cm  cystic/necrotic left level III/VA metastatic lymph node. Right sphenoid sinusitis. Electronically Signed   By: Kellie Simmering DO   On: 01/15/2019 10:43    Assessment and plan- Patient is a 69 y.o. male with newly diagnosed squamous cell carcinoma of the supraglottic larynx  Although on CT soft tissue neck the mass seemed to involve both the true and false vocal cord; based on Dr. Reola Mosher exam-true vocal cords were mobile and clear of malignancy.  Based on invasion of the preepiglottic space patient at least has a T3N1 lesion.  We will review his case at tumor board to ascertain if thyroid cartilage is truly involved or not and if an MRI would help characterize this further.  If there is definitive involvement of thyroid cartilage then patient would benefit from upfront total laryngectomy and lymph node dissection and consideration for adjuvant treatment based on laryngectomy findings.  However if thyroid cartilage is not involved-consideration can be given for concurrent chemoradiation with or without induction chemotherapy.  We will discuss patient's case at tumor board in 2 days time.  I will obtain PET CT scan in the meanwhile to complete her staging work-up.  If there is no evidence of distant metastatic disease treatment will be given with a curative intent  Neoplasm related pain: In order to reduce the amount of Tylenol patient is getting in his pain medicines I will switch him to oxycodone without Tylenol 10 mg every 4 hours as needed for his pain and titrate based on pain relief.  And was advised the patient to take MiraLAX 1 to 2 packets daily along with Senokot to prevent opioid-induced constipation  Based on tumor board discussion I will recheck with the patient after PET CT scan and potentially an MRI and see him back in 2 weeks to discuss further work-up and management   Thank you for this kind referral and the opportunity to participate in the care of this patient   Visit Diagnosis 1.  Squamous cell carcinoma of larynx (HCC)   2. Goals of care, counseling/discussion   3. Neoplasm related pain     Dr. Randa Evens, MD, MPH [facility] [id number] 01/25/2019 3:14 PM

## 2019-01-28 ENCOUNTER — Ambulatory Visit
Admission: RE | Admit: 2019-01-28 | Discharge: 2019-01-28 | Disposition: A | Discharge status: Home / Self Care | Payer: Medicare Other | Source: Ambulatory Visit | Attending: Oncology | Admitting: Oncology

## 2019-01-28 ENCOUNTER — Other Ambulatory Visit: Payer: Self-pay

## 2019-01-28 DIAGNOSIS — C329 Malignant neoplasm of larynx, unspecified: Secondary | ICD-10-CM | POA: Insufficient documentation

## 2019-01-28 DIAGNOSIS — C321 Malignant neoplasm of supraglottis: Secondary | ICD-10-CM | POA: Diagnosis not present

## 2019-01-28 LAB — POCT I-STAT CREATININE: Creatinine, Ser: 0.5 mg/dL — ABNORMAL LOW (ref 0.61–1.24)

## 2019-01-28 MED ORDER — GADOBUTROL 1 MMOL/ML IV SOLN
5.0000 mL | Freq: Once | INTRAVENOUS | Status: AC | PRN
  Administered 2019-01-28: 5 mL via INTRAVENOUS

## 2019-01-29 ENCOUNTER — Telehealth: Payer: Self-pay

## 2019-01-29 ENCOUNTER — Ambulatory Visit: Payer: Medicare Other

## 2019-01-29 NOTE — Telephone Encounter (Signed)
Called pt informed him of the information below. Pt gave verbal understanding. Lengthy discussion had with pt on the importance of Eligard injection. Pt states that he is overwhelmed with health issues currently as he was just diagnosed with throat cancer. He states that he will call us back in a few weeks to schedule injection.

## 2019-01-29 NOTE — Telephone Encounter (Signed)
-----   Message from Abbie Sons, MD sent at 01/27/2019 12:31 PM EST ----- PSA and September was 0.9 however his most recent PSA has increased to 21.03.  Recommend scheduling a leuprolide injection.

## 2019-01-30 ENCOUNTER — Other Ambulatory Visit: Payer: Medicare Other | Admitting: Adult Health Nurse Practitioner

## 2019-01-30 ENCOUNTER — Telehealth: Payer: Self-pay

## 2019-01-30 ENCOUNTER — Ambulatory Visit
Admission: RE | Admit: 2019-01-30 | Discharge: 2019-01-30 | Disposition: A | Discharge status: Home / Self Care | Payer: Medicare Other | Source: Ambulatory Visit | Attending: Oncology | Admitting: Oncology

## 2019-01-30 ENCOUNTER — Other Ambulatory Visit: Payer: Self-pay

## 2019-01-30 DIAGNOSIS — Z85828 Personal history of other malignant neoplasm of skin: Secondary | ICD-10-CM | POA: Insufficient documentation

## 2019-01-30 DIAGNOSIS — C779 Secondary and unspecified malignant neoplasm of lymph node, unspecified: Secondary | ICD-10-CM | POA: Diagnosis not present

## 2019-01-30 DIAGNOSIS — R918 Other nonspecific abnormal finding of lung field: Secondary | ICD-10-CM | POA: Insufficient documentation

## 2019-01-30 DIAGNOSIS — F17210 Nicotine dependence, cigarettes, uncomplicated: Secondary | ICD-10-CM | POA: Insufficient documentation

## 2019-01-30 DIAGNOSIS — J439 Emphysema, unspecified: Secondary | ICD-10-CM | POA: Diagnosis not present

## 2019-01-30 DIAGNOSIS — Z8546 Personal history of malignant neoplasm of prostate: Secondary | ICD-10-CM | POA: Insufficient documentation

## 2019-01-30 DIAGNOSIS — C329 Malignant neoplasm of larynx, unspecified: Secondary | ICD-10-CM | POA: Diagnosis not present

## 2019-01-30 DIAGNOSIS — I252 Old myocardial infarction: Secondary | ICD-10-CM | POA: Insufficient documentation

## 2019-01-30 LAB — GLUCOSE, CAPILLARY: Glucose-Capillary: 91 mg/dL (ref 70–99)

## 2019-01-30 MED ORDER — FLUDEOXYGLUCOSE F - 18 (FDG) INJECTION
6.1000 | Freq: Once | INTRAVENOUS | Status: AC | PRN
  Administered 2019-01-30: 6.18 via INTRAVENOUS

## 2019-01-30 NOTE — Telephone Encounter (Signed)
Phone call placed to patient to reschedule visit with Amy NP from today to Thursday 02/01/19 @ 11am. Patient shared he is scheduled for scans today.

## 2019-02-01 ENCOUNTER — Telehealth: Payer: Self-pay

## 2019-02-01 ENCOUNTER — Other Ambulatory Visit: Payer: Medicare Other | Admitting: Adult Health Nurse Practitioner

## 2019-02-01 ENCOUNTER — Other Ambulatory Visit: Payer: Self-pay

## 2019-02-01 DIAGNOSIS — J430 Unilateral pulmonary emphysema [MacLeod's syndrome]: Secondary | ICD-10-CM | POA: Diagnosis not present

## 2019-02-01 DIAGNOSIS — Z515 Encounter for palliative care: Secondary | ICD-10-CM

## 2019-02-01 NOTE — Telephone Encounter (Signed)
At the direction of Amy NP, message sent to PCP to follow up on referrals sent to dermatology and oral surgeon at [facility]. Also, requested referral for a nutritionist due to patient's poor intake related to disease processes

## 2019-02-01 NOTE — Progress Notes (Addendum)
[facility] Consult Note Telephone: [phone]  Fax: [phone]  PATIENT NAME: Kevin Nelson DOB: [birth year] MRN: [id number]  PRIMARY CARE PROVIDER:   [name], NP  REFERRING PROVIDER:  [name], NP [address] [place],  [place] [address]  RESPONSIBLE PARTY:   Self [phone]    RECOMMENDATIONS and PLAN:  1.  Advanced care planning.  Listed as DNR from September 2020 hospital stay.  Did not have a chance to have conversation on ACP today.  Will discuss at a future visit.  Have next visit scheduled in 4 weeks.  2.  End stage COPD.  O2 dependent a 3L. Chronic DOE and cough stable.   Has a mask he is supposed to wear at night for his oxygen but states that he does not feel like he is getting enough oxygen through it and has just been using his nasal canula.  Will reach out to [facility] to see if they can come out and help with any adjustment or changes that may be needed to help him get proper oxygenation.  3.  Squamous cell carcinoma of larynx.  Patient has just been diagnosed with this and is still awaiting results of imaging to determine treatment options.  He states that it was discussed that he may have to have his voice box removed.  Note from tumor board recommends concurrent chemo and radiation. Does have pain of his throat related to the cancer.  Gets relief with his oxycodone 10 mg Q 4hrs PRN.  Continue follow up and recommendations by oncology  4.  Prostate cancer.  Patient had been receiving Lupron shots for this and in September 2020 his PSA was 0.9.  His PSA is now 21.03.  He is being followed by urology and Dr. Bernardo Heater would like him to come back in to continue Lupron shots.  He does have menopausal symptoms related to the Lupron but continues with the therapy because it has been effective at treating his prostate cancer.  Encouraged to continue follow up with Dr. Bernardo Heater.  5.  Skin cancer.  States having several  lesions removed over the past 16 years.  States he has 2 lesions on his face that need to be removed and would like to have them removed via Mohs at dermatology at [facility].  Will reach out to PCP for this referral.  6. Dental. States having top dentures and partial on the bottom.  States that he has tooth root that needs to be removed and needs to see oral surgeon for this.  Is requesting oral surgeon at [facility].  Will reach out to PCP for this referral.  7.  Functional status/ Nutrition.  Patient is able to ambulate without assistive devices.  Is able to perform ADLs independently.  Can help with light housework.   Does tire easily and takes frequent rest breaks.  He has been having left foot pain at the ball of his foot.  States that it gets worse throughout the day as he is on it more.  The pain is not relieved with the oxy he currently takes.  He does have 1+ pedal pulses and good cap refill in his toes.  Recommend xray to rule out/in bone spur that may be causing the pain.  Patient does have a lot going on medically right now and this may not take precedence at this moment but will need to be evaluated in the future.   Patient  has lost 25 pounds over the past 2 years.  The throat cancer has made it hard to swallow.  States that his lowest weight was 105 and he is now at 117.  The weight gain is encouraging but he does state that it is hard to find foods that are easy to swallow.  Milk products make his phlegm thicker and harder to cough up.  He does supplement with Premier Protein.  Will reach to PCP for referral for dietician to help him with his nutritional needs and this will also be beneficial as he is going through treatment.  8.  Support.  Patient has anxiety related to his multiple medical issues.  Does state that he takes clonopin just at night and that helps him.  His girlfriend is very supportive and helps him with taking care of the house and cooking.  She does have concerns  that she is not doing enough to help him.  Offered assurance that she was doing very well with all that she is doing.     I spent 90 minutes providing this consultation, including time with patient and family, chart review, provider collaboration, and documentation . More than 50% of the time in this consultation was spent coordinating communication.   HISTORY OF PRESENT ILLNESS:  Kevin Nelson is a 69 y.o. year old male with multiple medical problems including severe stage 3 COPD, larynx cancer, prostate cancer, skin cancer, CAD, chronic hepatitis. Palliative Care was asked to help address goals of care.   CODE STATUS: see above  PPS: 60% HOSPICE ELIGIBILITY/DIAGNOSIS: TBD  PHYSICAL EXAM:  BP 106/59  HR  80  O2 98% on 3L General: NAD, frail appearing, thin Cardiovascular: regular rate and rhythm Pulmonary: lung sounds significantly diminished; normal respiratory effort Abdomen: soft, nontender, + bowel sounds Extremities: no edema, no joint deformities Neurological: Weakness but otherwise nonfocal  PAST MEDICAL HISTORY:  Past Medical History:  Diagnosis Date  . Anxiety   . Basal cell carcinoma   . Cigarette smoker   . Dental decay   . Dysphagia   . Dyspnea   . End stage COPD ([facility])    end stage copd, emphysema  . Erectile dysfunction   . Headache    every morning  . Hepatitis C    treated for 8 weeks this past year with Mayvret  . History of nonmelanoma skin cancer   . Hoarseness, chronic   . Myocardial infarction [facility]) march/april 2020   mild heart attack  . Prostate cancer ([facility])   . Respiratory failure, acute ([facility])    was inpt for 5 days. thought he was going to die.  . Sleep apnea   . Squamous cell skin cancer   . Throat cancer ([facility])   . Use of leuprolide acetate (Lupron)    treated for prostate cancer.   . 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 Smoking status: Current Every Day Smoker    Packs/day:  0.50    Years: 52.00    Pack years: 26.00    Types: Cigarettes  . Smokeless tobacco: Never Used  . Tobacco comment: Currently smoking 8-9 cigarettes/day at his peak was smoking 2 packs/day until 14 April 2018  Substance Use Topics  . Alcohol use: Yes    Alcohol/week: 21.0 standard drinks    Types: 21 Cans of beer per week    Comment: 3 a day  ALLERGIES: No Known Allergies   PERTINENT MEDICATIONS:  Outpatient Encounter Medications as of 02/01/2019  Medication Sig  . clonazePAM (KLONOPIN) 0.5 MG tablet Take 1 tablet (0.5 mg total) by mouth 2 (two) times daily as needed. for anxiety (Patient taking differently: Take 0.5 mg by mouth 2 (two) times daily. )  . COMBIVENT RESPIMAT 20-100 MCG/ACT AERS respimat INHALE 1 PUFF BY MOUTH INTO THE LUNGS EVERY 6 HOURS AS NEEDED  . fluticasone (FLONASE) 50 MCG/ACT nasal spray 1 spray into each nostril twice a day (Patient taking differently: Place 1 spray into both nostrils 2 (two) times daily as needed for allergies or rhinitis. )  . ipratropium-albuterol (DUONEB) 0.5-2.5 (3) MG/3ML SOLN Take 3 mLs by nebulization every 6 (six) hours as needed. (Patient taking differently: Take 3 mLs by nebulization every 6 (six) hours as needed (shortness of breath). )  . lidocaine (XYLOCAINE) 2 % solution Use as directed 15 mLs in the mouth or throat every 6 (six) hours as needed for mouth pain (for sore throat).  . ondansetron (ZOFRAN) 4 MG tablet Take 1 tablet (4 mg total) by mouth every 8 (eight) hours as needed for nausea or vomiting.  . Oxycodone HCl 10 MG TABS Take 1 tablet (10 mg total) by mouth every 4 (four) hours as needed.  [name] oxyCODONE-acetaminophen (PERCOCET/ROXICET) 5-325 MG tablet Take 1-2 tablets by mouth every 6 (six) hours as needed for severe pain.  . OXYGEN Inhale 3 L into the lungs daily.  [name] STIOLTO RESPIMAT 2.5-2.5 MCG/ACT AERS Inhale 2 puffs into the lungs 2 (two) times daily as needed (shortness of breath).    No facility-administered encounter  medications on file as of 02/01/2019.      Anisa Leanos Jenetta Downer, NP

## 2019-02-02 ENCOUNTER — Encounter: Payer: Self-pay | Admitting: Oncology

## 2019-02-02 ENCOUNTER — Telehealth: Payer: Self-pay

## 2019-02-02 ENCOUNTER — Inpatient Hospital Stay (HOSPITAL_BASED_OUTPATIENT_CLINIC_OR_DEPARTMENT_OTHER): Payer: Medicare Other | Admitting: Oncology

## 2019-02-02 ENCOUNTER — Other Ambulatory Visit: Payer: Self-pay

## 2019-02-02 ENCOUNTER — Other Ambulatory Visit: Payer: Self-pay | Admitting: Nurse Practitioner

## 2019-02-02 DIAGNOSIS — C329 Malignant neoplasm of larynx, unspecified: Secondary | ICD-10-CM

## 2019-02-02 DIAGNOSIS — E440 Moderate protein-calorie malnutrition: Secondary | ICD-10-CM

## 2019-02-02 DIAGNOSIS — G893 Neoplasm related pain (acute) (chronic): Secondary | ICD-10-CM | POA: Diagnosis not present

## 2019-02-02 DIAGNOSIS — J449 Chronic obstructive pulmonary disease, unspecified: Secondary | ICD-10-CM

## 2019-02-02 DIAGNOSIS — Z7189 Other specified counseling: Secondary | ICD-10-CM

## 2019-02-02 NOTE — Telephone Encounter (Signed)
-----   Message from [name], NP sent at 02/02/2019  2:32 PM EST ----- Please check on referrals to dermatology and to oral surgeon at [facility], palliative team reports he has not heard from them.  Thank you.

## 2019-02-02 NOTE — Progress Notes (Signed)
refer

## 2019-02-02 NOTE — Progress Notes (Signed)
Patient stated that he has had nausea, not able to sleep at night due to being SOB, poor appetite because it gets hard to swallow his food and stays in pain on his throat.

## 2019-02-02 NOTE — Telephone Encounter (Signed)
Sent a message to referrals team to check on the referral.

## 2019-02-05 NOTE — Progress Notes (Signed)
I connected with Kevin Nelson on 02/05/19 at 11:00 AM EST by video enabled telemedicine visit and verified that I am speaking with the correct person using two identifiers.   I discussed the limitations, risks, security and privacy concerns of performing an evaluation and management service by telemedicine and the availability of in-person appointments. I also discussed with the patient that there may be a patient responsible charge related to this service. The patient expressed understanding and agreed to proceed.  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 Richardson Landry with symptoms of difficulty swallowing and persistent sore throat which has been ongoing for the last 1 year.  NPL exam showed a friable mass involving the false vocal cord but did not involve the true vocal cords.  True vocal cords remain mobile on exam.  Hypopharynx and tongue base was clear.    This was followed by CT soft tissue neck which showed a 3.3 x 1.7 x 3.5 cm mass which was thought to be involving both the true and false vocal cords and extending anteriorly to involve the anterior commissure.  Mass extends to involve the paraglottic space and erodes through the thyroid cartilage anteriorly and left of midline.  There is also invasion of the preepiglottic space inferiorly.  Enlarged cystic necrotic level 3/4 lymph node measuring 1.6 x 1.2 cm.  Findings are consistent with nodal metastatic disease.  Biopsy of the mass showed squamous cell carcinoma.    Interval history patient reports that his pain is better controlled with  oxycodone.  Bowel movements are fairly regular.   Review of Systems  Constitutional: Negative for chills, fever, malaise/fatigue and weight loss.  HENT: Negative for congestion, ear discharge and nosebleeds.        Throat pain  Eyes: Negative for blurred vision.  Respiratory: Negative for cough, hemoptysis, sputum production, shortness of breath and wheezing.   Cardiovascular: Negative for chest pain, palpitations, orthopnea and claudication.  Gastrointestinal: Negative for abdominal pain, blood in stool, constipation, diarrhea, heartburn, melena, nausea and vomiting.  Genitourinary: Negative for dysuria, flank pain, frequency, hematuria and urgency.  Musculoskeletal: Negative for back pain, joint pain and myalgias.  Skin: Negative for rash.  Neurological: Negative for dizziness, tingling, focal weakness, seizures, weakness and headaches.  Endo/Heme/Allergies: Does not bruise/bleed easily.  Psychiatric/Behavioral: Negative for depression and suicidal ideas. The patient does not have insomnia.     No Known Allergies  Past Medical History:  Diagnosis Date  . Anxiety   . Basal cell carcinoma   . Cigarette smoker   . Dental decay   . Dysphagia   . Dyspnea   . End stage COPD ([place])    end stage copd, emphysema  . Erectile dysfunction   . Headache    every morning  . Hepatitis C    treated for 8 weeks this past year with Mayvret  . History of nonmelanoma skin cancer   . Hoarseness, chronic   . Myocardial infarction [facility]) march/april 2020   mild heart attack  . Prostate cancer ([place])   . Respiratory failure, acute ([place])    was inpt for 5  days. thought he was going to die.  . Sleep apnea   . Squamous cell skin cancer   . Throat cancer ([place])   . Use of leuprolide acetate (Lupron)    treated for prostate cancer.   . 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    Past Surgical History:  Procedure Laterality Date  . FACIAL LACERATION REPAIR    . HERNIA REPAIR Left  1992   inguinal  . MICROLARYNGOSCOPY N/A 01/17/2019   Procedure: MICROLARYNGOSCOPY WITH BX;  Surgeon: Clyde Canterbury, MD;  Location: [facility] ORS;  Service: ENT;  Laterality: N/A;  . PROSTATE BIOPSY    . SKIN CANCER EXCISION    . TRIGGER FINGER RELEASE Left 10/2018    Social History   Socioeconomic History  . Marital status: Significant Other    Spouse name: [name], girlfriend  . Number of children: Not on file  . Years of education: Not on file  . Highest education level: Not on file  Occupational History  . Occupation: Chief Strategy Officer    Comment: retired  Tobacco Use  . Smoking status: Current Every Day Smoker    Packs/day: 0.50    Years: 52.00    Pack years: 26.00    Types: Cigarettes  . Smokeless tobacco: Never Used  . Tobacco comment: Currently smoking 8-9 cigarettes/day at his peak was smoking 2 packs/day until 14 April 2018  Substance and Sexual Activity  . Alcohol use: Yes    Alcohol/week: 21.0 standard drinks    Types: 21 Cans of beer per week    Comment: 3 a day   . Drug use: Never  . Sexual activity: Yes  Other Topics Concern  . Not on file  Social History Narrative   Patient has retired d/t respiratory status. Currently lives with girlfriend    Social Determinants of Health   Financial Resource Strain: Low Risk   . Difficulty of Paying Living Expenses: Not hard at all  Food Insecurity:   . Worried About Charity fundraiser in the Last Year: Not on file  . Ran Out of Food in the Last Year: Not on file  Transportation Needs:   . Lack of Transportation (Medical): Not on file  . Lack of Transportation (Non-Medical): Not on file  Physical Activity:   . Days of Exercise per Week: Not on file  . Minutes of Exercise per Session: Not on file  Stress:   . Feeling of Stress : Not on file  Social Connections:   . Frequency of Communication with Friends and Family: Not on file  . Frequency of Social Gatherings with Friends and Family: Not on file  . Attends Religious  Services: Not on file  . Active Member of Clubs or Organizations: Not on file  . Attends Archivist Meetings: Not on file  . Marital Status: Not on file  Intimate Partner Violence:   . Fear of Current or Ex-Partner: Not on file  . Emotionally Abused: Not on file  . Physically Abused: Not on file  . Sexually Abused: Not on file    Family History  Problem Relation Age of Onset  . Cancer Sister   . Cancer - Cervical Mother   . Heart attack Brother   . Heart attack Maternal Grandfather      Current Outpatient Medications:  .  clonazePAM (KLONOPIN) 0.5 MG tablet, Take 1 tablet (0.5 mg total) by mouth 2 (two) times daily as needed. for anxiety (Patient taking differently: Take  0.5 mg by mouth 2 (two) times daily. ), Disp: 60 tablet, Rfl: 2 .  COMBIVENT RESPIMAT 20-100 MCG/ACT AERS respimat, INHALE 1 PUFF BY MOUTH INTO THE LUNGS EVERY 6 HOURS AS NEEDED, Disp: 4 g, Rfl: 2 .  ipratropium-albuterol (DUONEB) 0.5-2.5 (3) MG/3ML SOLN, Take 3 mLs by nebulization every 6 (six) hours as needed. (Patient taking differently: Take 3 mLs by nebulization every 6 (six) hours as needed (shortness of breath). ), Disp: 360 mL, Rfl: 11 .  lidocaine (XYLOCAINE) 2 % solution, Use as directed 15 mLs in the mouth or throat every 6 (six) hours as needed for mouth pain (for sore throat)., Disp: 100 mL, Rfl: 2 .  Oxycodone HCl 10 MG TABS, Take 1 tablet (10 mg total) by mouth every 4 (four) hours as needed., Disp: 60 tablet, Rfl: 0 .  OXYGEN, Inhale 3 L into the lungs daily., Disp: , Rfl:  .  STIOLTO RESPIMAT 2.5-2.5 MCG/ACT AERS, Inhale 2 puffs into the lungs 2 (two) times daily as needed (shortness of breath). , Disp: , Rfl:  .  fluticasone (FLONASE) 50 MCG/ACT nasal spray, 1 spray into each nostril twice a day (Patient not taking: Reported on 02/02/2019), Disp: 16 g, Rfl: 2 .  ondansetron (ZOFRAN) 4 MG tablet, Take 1 tablet (4 mg total) by mouth every 8 (eight) hours as needed for nausea or vomiting. (Patient  not taking: Reported on 02/02/2019), Disp: 30 tablet, Rfl: 0  DG Chest 2 View  Result Date: 01/15/2019 CLINICAL DATA:  Laryngeal cancer.  COPD. EXAM: CHEST - 2 VIEW COMPARISON:  09/29/2018 FINDINGS: The cardiomediastinal silhouette is unchanged with normal heart size. There is chronic pulmonary arterial enlargement. The lungs remain hyperinflated with underlying emphysema and no evidence of airspace consolidation, edema, pleural effusion, or pneumothorax. No acute osseous abnormality is identified. IMPRESSION: COPD without evidence of acute cardiopulmonary disease. Electronically Signed   By: Logan Bores M.D.   On: 01/15/2019 10:33   CT SOFT TISSUE NECK W CONTRAST  Result Date: 01/15/2019 CLINICAL DATA:  Laryngeal cancer, throat pain for 3 years. EXAM: CT NECK WITH CONTRAST TECHNIQUE: Multidetector CT imaging of the neck was performed using the standard protocol following the bolus administration of intravenous contrast. CONTRAST:  76mL OMNIPAQUE IOHEXOL 300 MG/ML  SOLN COMPARISON:  Chest CT 06/19/2018 FINDINGS: Pharynx and larynx: No appreciable mass or swelling within the nasopharynx, oral cavity or oropharynx. There is an irregular laryngeal mass involving the true and false vocal cords (greater on the left) also extending anteriorly to involve the anterior commissure. The mass extends to involve the paraglottic space and erodes through the thyroid cartilage anteriorly and left of midline. There is also invasion of the pre epiglottic space inferiorly, as well as some supraglottic extension on the left. The mass measures approximately 3.3 x 1.7 x 3.5 cm (AP x TV x CC) (series 2, image 67) (series 5, image 43). Associated moderate narrowing of the laryngeal airway. Salivary glands: No evidence of inflammation, mass or stone. Thyroid: Negative Lymph nodes: There is enlarged, cystic/necrotic left level III/VA lymph node measuring 1.6 x 1.2 cm (series 2, image 70). Findings are consistent with nodal  metastatic disease. No other pathologically enlarged or suspicious cervical lymph nodes are identified. Vascular: The major vascular structures of the neck appear patent. Atherosclerotic disease. Limited intracranial: No abnormality identified. Visualized orbits: Visualized orbits demonstrate no acute abnormality. Mastoids and visualized paranasal sinuses: Minimal partial opacification of right ethmoid air cells. Air-fluid level and frothy secretions within the right sphenoid  sinus. No significant mastoid effusion. Skeleton: No acute bony abnormality or suspicious osseous lesion. Upper chest: Emphysema within the imaged lung apices. IMPRESSION: Laryngeal mass measuring approximately 3.3 x 1.7 x 3.5 cm as detailed. The mass involves the bilateral true and false vocal cords (greater on the left). Additionally, the mass extends to involve anterior commissure, paraglottic space and erodes through the thyroid cartilage anteriorly and left of midline. There is also invasion of the pre epiglottic space inferiorly, as well as some supraglottic extension of the tumor on the left. Associated moderate narrowing of the laryngeal airway. 1.6 cm cystic/necrotic left level III/VA metastatic lymph node. Right sphenoid sinusitis. Electronically Signed   By: Kellie Simmering DO   On: 01/15/2019 10:43   NM PET Image Initial (PI) Skull Base To Thigh  Result Date: 01/30/2019 CLINICAL DATA:  Initial treatment strategy for squamous cell carcinoma larynx. EXAM: NUCLEAR MEDICINE PET SKULL BASE TO THIGH TECHNIQUE: 6.1 mCi F-18 FDG was injected intravenously. Full-ring PET imaging was performed from the skull base to thigh after the radiotracer. CT data was obtained and used for attenuation correction and anatomic localization. Fasting blood glucose: 91 mg/dl COMPARISON:  Neck CT 01/15/2019 FINDINGS: Mediastinal blood pool activity: SUV max 2.2 Liver activity: SUV max 2.7 NECK: Broad band of hypermetabolic thickening of the LEFT larynx  extending anterior to involve the anterior commissure to level of the thyroid cartilage on LEFT and RIGHT. Activity is intense with SUV max equal 30.8. Single hypermetabolic LEFT level III lymph node measures only 8 mm short axis (image 59/4) but is intensely hypermetabolic with SUV max equal 13.7. Incidental CT findings: none CHEST: No hypermetabolic pulmonary nodules. Lungs are hyperinflated. No metastatic mediastinal hilar lymphadenopathy. Incidental CT findings: none ABDOMEN/PELVIS: No abnormal hypermetabolic activity within the liver, pancreas, adrenal glands, or spleen. No hypermetabolic lymph nodes in the abdomen or pelvis. Incidental CT findings: none SKELETON: Sclerotic lesion in the RIGHT iliac wing measuring 12 mm without metabolic activity is there are benign. Similar lesion in the RIGHT iliac bone along the SI joint measuring 7 mm on image 207) Incidental CT findings: none IMPRESSION: 1. Hypermetabolic mass in the LEFT glottis extending to the anterior commissure. 2. Solitary hypermetabolic LEFT level 2 metastatic lymph node. 3. No evidence distant metastatic disease. 4. Emphysema and hyperinflated lungs. Electronically Signed   By: Suzy Bouchard M.D.   On: 01/30/2019 15:18   MR NECK W WO CONTRAST  Result Date: 01/28/2019 CLINICAL DATA:  Squamous cell carcinoma of the larynx. EXAM: MRI OF THE NECK WITH CONTRAST TECHNIQUE: Multiplanar, multisequence MR imaging was performed following the administration of intravenous contrast. CONTRAST:  23mL GADAVIST GADOBUTROL 1 MMOL/ML IV SOLN COMPARISON:  01/15/2019 CT neck FINDINGS: Examination is degraded by motion. Pharynx and larynx: The nasopharynx is clear. The oral cavity and base of tongue are normal. Adenoid, palatine and lingual tonsils are normal. There is a left laryngeal mass predominantly involving the left vocal folds extending to the anterior commissure and superiorly to the left supraglottic larynx. There is persistent extension into the pre  epiglottic space and invasion of the thyroid cartilage. The mass measures 3.0 cm AP x 1.3 cm TV x 3.8 cm CC (series 13, image 19; series 10, image 15). Salivary glands: Normal Thyroid: Normal Lymph nodes: Necrotic left level 5A lymph node measures 10 mm (series 13, image 24). There is a 6 mm left level 5 B node (series 13, image 28). Vascular: Normal flow voids Limited intracranial: Normal Visualized orbits: Normal Mastoids and  visualized paranasal sinuses: Unremarkable Skeleton: Normal marrow signal Upper chest: Negative Other: None IMPRESSION: 1. Motion degraded examination. 2. Slightly increased size of laryngeal mass, allowing for differences in technique. As before, the mass involves the anterior commissure, pre epiglottic space and thyroid cartilage. 3. Unchanged necrotic left level 5A lymph node and 6 mm left level 5B lymph node. Electronically Signed   By: Ulyses Jarred M.D.   On: 01/28/2019 19:26    No images are attached to the encounter.   CMP Latest Ref Rng & Units 01/28/2019  Glucose 65 - 99 mg/dL -  BUN 8 - 27 mg/dL -  Creatinine 0.61 - 1.24 mg/dL 0.50(L)  Sodium 134 - 144 mmol/L -  Potassium 3.5 - 5.2 mmol/L -  Chloride 96 - 106 mmol/L -  CO2 20 - 29 mmol/L -  Calcium 8.6 - 10.2 mg/dL -  Total Protein 6.0 - 8.5 g/dL -  Total Bilirubin 0.0 - 1.2 mg/dL -  Alkaline Phos 39 - 117 IU/L -  AST 0 - 40 IU/L -  ALT 0 - 44 IU/L -   CBC Latest Ref Rng & Units 10/24/2018  WBC 3.4 - 10.8 x10E3/uL 6.4  Hemoglobin 13.0 - 17.7 g/dL 13.5  Hematocrit 37.5 - 51.0 % 41.0  Platelets 150 - 450 x10E3/uL 364     Observation/objective: Appears in no acute distress of a video visit today.  Breathing is nonlabored  Assessment and plan: Patient is a 69 year old male with newly diagnosed squamous cell carcinoma of the supraglottic larynx stage IV [id number] here to discuss further management  I have reviewed PET/CT scan images as well as MRI neck findings independently and discussed findings with  the patient.PET CT scan shows hypermetabolic left glottis mass extending to the anterior commissure along with a solitary level 2 metastatic lymph node.  MRI neck also confirms that this mass which measures about 3 x 1.3 x 3.8 cm extends into preepiglottic space along with invasion of thyroid cartilage.  This therefore makes it T4a/stage IVa disease.  Discussed with the patient that in cases where thyroid cartilage is involved concurrent chemoradiation plus minus induction chemotherapy will not achieve definitive cure and he is likely to have residual disease despite receiving chemoradiation.  Undergoing salvage surgery after chemoradiation is complicated when there is associated radiation fibrosis.  Therefore with involvement of thyroid cartilage upfront total laryngectomy and bilateral neck dissection would be recommended if he is a surgical candidate.  Surgery would offer him the best chance of cure.  He has an upcoming appointment with [facility] next week to discuss surgical options which have encouraged him to keep.  I will keep in touch with Dr. Richardson Landry as well as [facility] recommendations to see which way we are going to proceed in the care of this patient.  Neoplasm related pain: Continue as needed oxycodone.  He will also continue bowel medications for opioid associated constipation  Follow-up instructions: Follow-up to be decided based on [facility] recommendations  I discussed the assessment and treatment plan with the patient. The patient was provided an opportunity to ask questions and all were answered. The patient agreed with the plan and demonstrated an understanding of the instructions.   The patient was advised to call back or seek an in-person evaluation if the symptoms worsen or if the condition fails to improve as anticipated.  Visit Diagnosis: 1. Laryngeal cancer ([place])   2. Goals of care, counseling/discussion   3. Neoplasm related pain     Dr. Randa Evens, MD,  [facility] at [facility] Tel- [phone] 02/05/2019 1:10 PM

## 2019-02-06 ENCOUNTER — Inpatient Hospital Stay: Payer: Medicare Other | Admitting: Oncology

## 2019-02-06 ENCOUNTER — Telehealth: Payer: Self-pay | Admitting: Nurse Practitioner

## 2019-02-06 NOTE — Telephone Encounter (Signed)
Pt presented in office upset due to hold time with pec. He stated that he wanted [name] to call him. He says that he was on his way to [facility] to see if they were going to remove his throat cancer.

## 2019-02-06 NOTE — Telephone Encounter (Signed)
Spoke to patient on phone and he was upset due to being on hold for so long, apologized for his frustration.  He goes to [facility] tomorrow to see if can remove throat cancer.  He did express his frustration with his recent diagnosis and states now he has been told "my PSA is going up again and I need treatment again and I did not like Lupron in past".  Also reported frustration over "maybe having to lose my vocal cords and if I can not sing, I do not want them taking everything".  His hobby is playing guitar and singing.  Currently being followed by palliative care team.

## 2019-02-07 ENCOUNTER — Other Ambulatory Visit: Payer: Self-pay | Admitting: *Deleted

## 2019-02-07 DIAGNOSIS — Z79891 Long term (current) use of opiate analgesic: Secondary | ICD-10-CM | POA: Diagnosis not present

## 2019-02-07 DIAGNOSIS — Z85828 Personal history of other malignant neoplasm of skin: Secondary | ICD-10-CM | POA: Diagnosis not present

## 2019-02-07 DIAGNOSIS — J387 Other diseases of larynx: Secondary | ICD-10-CM | POA: Diagnosis not present

## 2019-02-07 DIAGNOSIS — Z7951 Long term (current) use of inhaled steroids: Secondary | ICD-10-CM | POA: Diagnosis not present

## 2019-02-07 DIAGNOSIS — J449 Chronic obstructive pulmonary disease, unspecified: Secondary | ICD-10-CM | POA: Diagnosis not present

## 2019-02-07 DIAGNOSIS — J438 Other emphysema: Secondary | ICD-10-CM | POA: Diagnosis not present

## 2019-02-07 DIAGNOSIS — B1920 Unspecified viral hepatitis C without hepatic coma: Secondary | ICD-10-CM | POA: Diagnosis not present

## 2019-02-07 DIAGNOSIS — C329 Malignant neoplasm of larynx, unspecified: Secondary | ICD-10-CM | POA: Diagnosis not present

## 2019-02-07 DIAGNOSIS — F17210 Nicotine dependence, cigarettes, uncomplicated: Secondary | ICD-10-CM | POA: Diagnosis not present

## 2019-02-08 MED ORDER — OXYCODONE HCL 10 MG PO TABS: 10.0000 mg | ORAL_TABLET | ORAL | 0 refills | Status: DC | PRN

## 2019-02-08 NOTE — Telephone Encounter (Signed)
Patient called cancer center requesting refill of Oxycodone 10 mg q 4 hours prn for pain.  As mandated by the [place] STOP Act (Strengthen Opioid Misuse Prevention), the  Controlled Substance Reporting System ([place]) was reviewed for this patient. Below is the past 59-months of controlled substance prescriptions as displayed by the registry. I have personally consulted with my supervising physician, Dr. Janese Banks, who agrees that continuation of opiate therapy is medically appropriate at this time and agrees to provide continual monitoring, including urine/blood drug screens, as indicated. Refill is appropriate on or after 02/02/2019.  [place]CCS[place] reviewed: Reviewed and acceptable for treatment    Faythe Casa, NP 02/08/2019 9:19 AM [phone]

## 2019-02-09 ENCOUNTER — Telehealth: Payer: Self-pay | Admitting: Pulmonary Disease

## 2019-02-09 ENCOUNTER — Telehealth: Payer: Self-pay

## 2019-02-09 DIAGNOSIS — C329 Malignant neoplasm of larynx, unspecified: Secondary | ICD-10-CM | POA: Diagnosis not present

## 2019-02-09 NOTE — Telephone Encounter (Signed)
Noted.  Will close encounter.  

## 2019-02-09 NOTE — Telephone Encounter (Signed)
Will route to Dr. Patsey Berthold.

## 2019-02-09 NOTE — Telephone Encounter (Signed)
I discussed Mr. Kevin Nelson's case with Dr. Tiburcio Pea.  Unfortunately Mr. Kevin Nelson has laryngeal cancer and will need total laryngectomy.  He would be a high risk due to his very severe COPD.  I have recommended Dr. Ihor Austin refer Mr. Kevin Nelson to see pulmonary at [facility] in preparation of total laryngectomy.

## 2019-02-12 ENCOUNTER — Telehealth: Payer: Self-pay

## 2019-02-12 NOTE — Telephone Encounter (Signed)
SW received referral from Amy, NP. SW contacted patient. Patient discussed medical history and recent medical appointments. Patient said he is waiting on a call back from [facility] to discuss scheduling a total laryngectomy. Patient expressed frustrations for his diagnoses and his limited options. Patient said he has been struggling medically for three years. Patient is appreciative of palliative care support and involvement but said he is "waiting on the plan with surgery first" and is "unsure of his needs at this point". SW provided emotional support, validated patient's feelings and concerns and used active and reflective listening. SW provided contact information for additional questions or concerns. SW notified NP of conversation.

## 2019-02-14 DIAGNOSIS — J387 Other diseases of larynx: Secondary | ICD-10-CM | POA: Diagnosis not present

## 2019-02-14 DIAGNOSIS — C329 Malignant neoplasm of larynx, unspecified: Secondary | ICD-10-CM | POA: Diagnosis not present

## 2019-02-14 DIAGNOSIS — J438 Other emphysema: Secondary | ICD-10-CM | POA: Diagnosis not present

## 2019-02-15 ENCOUNTER — Telehealth: Payer: Self-pay

## 2019-02-15 ENCOUNTER — Other Ambulatory Visit: Payer: Self-pay | Admitting: Nurse Practitioner

## 2019-02-15 ENCOUNTER — Other Ambulatory Visit: Payer: Self-pay

## 2019-02-15 ENCOUNTER — Other Ambulatory Visit: Payer: Medicare Other | Admitting: Adult Health Nurse Practitioner

## 2019-02-15 DIAGNOSIS — Z515 Encounter for palliative care: Secondary | ICD-10-CM | POA: Diagnosis not present

## 2019-02-15 DIAGNOSIS — C329 Malignant neoplasm of larynx, unspecified: Secondary | ICD-10-CM | POA: Diagnosis not present

## 2019-02-15 MED ORDER — CLONAZEPAM 1 MG PO TABS: 1.0000 mg | ORAL_TABLET | Freq: Two times a day (BID) | ORAL | 2 refills | Status: DC | PRN

## 2019-02-15 NOTE — Telephone Encounter (Signed)
At the direction of [name], NP, PCP contacted with update on Palliative care visit including noting patient's increased anxiety with agitation. Amy NP recommendation is to increase Klonopin to 1mg  BID from 0.5mg  BID.

## 2019-02-15 NOTE — Progress Notes (Signed)
Per recommendations of palliative team Klonopin increased to 1 MG BID PRN due to severe anxiety with recent diagnosis and chronic health issues.

## 2019-02-15 NOTE — Progress Notes (Signed)
[facility] Consult Note Telephone: [phone]  Fax: [phone]  PATIENT NAME: Kevin Nelson DOB: 1950-03-24 MRN: [id number]  PRIMARY CARE PROVIDER:   [name], NP  REFERRING PROVIDER:  [name], NP [address] [place],  [place] [address]  RESPONSIBLE PARTY:    Self [phone]    RECOMMENDATIONS and PLAN:  1.  Advanced care planning.  Listed as DNR from September 2020 hospital stay.  Patient understandably upset about diagnosis, see below, did not go over ACP today.  Have appointment in one week.  2.  Squamous cell carcinoma of larynx. This is new diagnosis and patient has been seeing Dr. Ihor Austin about treatment options.  It is recommended to have total laryngectomy.  Has appointment with pulmonologist tomorrow to discuss if able to go through surgery.  Patient does understand that his end stage COPD may be a barrier to having surgery.  Has another appointment with Dr. Ihor Austin after he sees pulmonology to go over options.  Patient is very anxious today and states that he has had increased anxiety due to present medical issues.  He has been taking his klonopin 0.5 mg 2 tabs at night just to be able to sleep.  Spent today's visit offering emotional support.  Have reached out to PCP to see if we can increase his klonopin to 1mg  BID PRN to help with his anxiety during this time of new diagnosis of larynx cancer and going through options of treatment.  Have appointment in one week to go over what he has found out from tomorrow's appointments and goals of care.   I spent 50 minutes providing this consultation, including time with patient/family, chart review, provider coordination, and documentation. More than 50% of the time in this consultation was spent coordinating communication.   HISTORY OF PRESENT ILLNESS:  Kevin Nelson is a 69 y.o. year old male with multiple medical problems including severe stage 3 COPD, larynx cancer,  prostate cancer, skin cancer, CAD, chronic hepatitis. Palliative Care was asked to help address goals of care.   CODE STATUS: see above  PPS: 60% HOSPICE ELIGIBILITY/DIAGNOSIS: TBD  PHYSICAL EXAM:   General: NAD, frail appearing, thin Extremities: no edema, no joint deformities Skin: no rashes on exposed skin Neurological: Weakness but otherwise nonfocal   PAST MEDICAL HISTORY:  Past Medical History:  Diagnosis Date  . Anxiety   . Basal cell carcinoma   . Cigarette smoker   . Dental decay   . Dysphagia   . Dyspnea   . End stage COPD ([place])    end stage copd, emphysema  . Erectile dysfunction   . Headache    every morning  . Hepatitis C    treated for 8 weeks this past year with Mayvret  . History of nonmelanoma skin cancer   . Hoarseness, chronic   . Myocardial infarction [facility]) march/april 2020   mild heart attack  . Prostate cancer ([place])   . Respiratory failure, acute ([place])    was inpt for 5 days. thought he was going to die.  . Sleep apnea   . Squamous cell skin cancer   . Throat cancer ([place])   . Use of leuprolide acetate (Lupron)    treated for prostate cancer.   . 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 Smoking status: Current Every Day Smoker    Packs/day: 0.50  Years: 52.00    Pack years: 26.00    Types: Cigarettes  . Smokeless tobacco: Never Used  . Tobacco comment: Currently smoking 8-9 cigarettes/day at his peak was smoking 2 packs/day until 14 April 2018  Substance Use Topics  . Alcohol use: Yes    Alcohol/week: 21.0 standard drinks    Types: 21 Cans of beer per week    Comment: 3 a day     ALLERGIES: No Known Allergies   PERTINENT MEDICATIONS:  Outpatient Encounter Medications as of 02/15/2019  Medication Sig  . clonazePAM (KLONOPIN) 0.5 MG tablet Take 1 tablet (0.5 mg total) by mouth 2 (two) times daily as needed. for anxiety (Patient taking differently: Take 0.5 mg by mouth 2  (two) times daily. )  . COMBIVENT RESPIMAT 20-100 MCG/ACT AERS respimat INHALE 1 PUFF BY MOUTH INTO THE LUNGS EVERY 6 HOURS AS NEEDED  . fluticasone (FLONASE) 50 MCG/ACT nasal spray 1 spray into each nostril twice a day (Patient not taking: Reported on 02/02/2019)  . ipratropium-albuterol (DUONEB) 0.5-2.5 (3) MG/3ML SOLN Take 3 mLs by nebulization every 6 (six) hours as needed. (Patient taking differently: Take 3 mLs by nebulization every 6 (six) hours as needed (shortness of breath). )  . lidocaine (XYLOCAINE) 2 % solution Use as directed 15 mLs in the mouth or throat every 6 (six) hours as needed for mouth pain (for sore throat).  . ondansetron (ZOFRAN) 4 MG tablet Take 1 tablet (4 mg total) by mouth every 8 (eight) hours as needed for nausea or vomiting. (Patient not taking: Reported on 02/02/2019)  . Oxycodone HCl 10 MG TABS Take 1 tablet (10 mg total) by mouth every 4 (four) hours as needed.  . OXYGEN Inhale 3 L into the lungs daily.  [name] STIOLTO RESPIMAT 2.5-2.5 MCG/ACT AERS Inhale 2 puffs into the lungs 2 (two) times daily as needed (shortness of breath).    No facility-administered encounter medications on file as of 02/15/2019.     Kevin Nelson Jenetta Downer, NP

## 2019-02-16 DIAGNOSIS — J449 Chronic obstructive pulmonary disease, unspecified: Secondary | ICD-10-CM | POA: Diagnosis not present

## 2019-02-19 ENCOUNTER — Telehealth: Payer: Self-pay

## 2019-02-19 ENCOUNTER — Other Ambulatory Visit: Payer: Self-pay | Admitting: Nurse Practitioner

## 2019-02-19 DIAGNOSIS — J449 Chronic obstructive pulmonary disease, unspecified: Secondary | ICD-10-CM | POA: Diagnosis not present

## 2019-02-19 DIAGNOSIS — Z87891 Personal history of nicotine dependence: Secondary | ICD-10-CM | POA: Diagnosis not present

## 2019-02-19 MED ORDER — IPRATROPIUM-ALBUTEROL 0.5-2.5 (3) MG/3ML IN SOLN: 3.0000 mL | Freq: Four times a day (QID) | RESPIRATORY_TRACT | 11 refills | Status: DC | PRN

## 2019-02-19 NOTE — Telephone Encounter (Signed)
Prior Authorization initiated via CoverMyMeds for Ipratropium-Albuterol 0.5-2.5 (3)MG/3ML solution Key: [id number]

## 2019-02-20 ENCOUNTER — Ambulatory Visit: Payer: Self-pay | Admitting: Pharmacist

## 2019-02-20 DIAGNOSIS — J449 Chronic obstructive pulmonary disease, unspecified: Secondary | ICD-10-CM

## 2019-02-20 DIAGNOSIS — F411 Generalized anxiety disorder: Secondary | ICD-10-CM

## 2019-02-20 NOTE — Telephone Encounter (Signed)
PA denied. Should receive a fax as to why.

## 2019-02-20 NOTE — Patient Instructions (Signed)
Visit Information  Goals Addressed            This Visit's Progress     Patient Stated   . Pharm D "I want to take care of myself" (pt-stated)       Current Barriers:  . Polypharmacy; complex patient with multiple comorbidities including COPD, tobacco abuse, prostate cancer, s/p tx for Hep C . Palliative care team [name], NP involved.  . Self-manages medications" o Squamous cell carcinoma of larynx: currently following w/ [facility] to determine treatment steps. Patient discusses that he worries about "being a burden" if he were to be on a "ventilator and feeding tube" o Prostate CA: s/p Lupron treatment;  o COPD/tobacco abuse: Stiolto daily, Duonebs or Combivent as needed. Noted that he was having a difficult time getting Duoneb and Stiolto filled. Follows w/ Dr. Camillo Flaming pulmonary o S/p tx Hep C: s/p Mayvret. o Anxiety: clonazepam 1 mg BID; recently increased. Patient notes he has not picked up this dose yet.  Pharmacist Clinical Goal(s):  [name] Over the next 90 days, patient will work with PharmD and provider towards optimized medication management  Interventions: . Contacted pharmacy. Appears Duonebs requires a PA to go through Part D. This was submitted yesterday. Will follow. If denied, will see if pharmacy can run through Part B. Pharmacist notes no issues w/ filling Stiolto. He will order today for patient to pick up later this week . Contacted patient, informed of the above. Provided empathetic listening as he discussed his recent diagnosis and goals of care.   Patient Self Care Activities:  . Patient will take medications as prescribed  Please see past updates related to this goal by clicking on the "Past Updates" button in the selected goal          The patient verbalized understanding of instructions provided today and declined a print copy of patient instruction materials.  Plan:  - Will outreach patient later this month as scheduled  [name], PharmD,  [place] [phone]

## 2019-02-20 NOTE — Chronic Care Management (AMB) (Signed)
Chronic Care Management   Follow Up Note   02/20/2019 Name: Kevin Nelson MRN: [id number] DOB: [birth year]  Referred by: [name], NP Reason for referral : Chronic Care Management (Medication Management)   Kevin Nelson is a 69 y.o. year old male who is a primary care patient of Cannady, [name], NP. The CCM team was consulted for assistance with chronic disease management and care coordination needs.    Received message regarding medication access needs.   Review of patient status, including review of consultants reports, relevant laboratory and other test results, and collaboration with appropriate care team members and the patient's provider was performed as part of comprehensive patient evaluation and provision of chronic care management services.    SDOH (Social Determinants of Health) screening performed today: Financial Strain  Stress. See Care Plan for related entries.   Outpatient Encounter Medications as of 02/20/2019  Medication Sig  . clonazePAM (KLONOPIN) 1 MG tablet Take 1 tablet (1 mg total) by mouth 2 (two) times daily as needed for anxiety.  . COMBIVENT RESPIMAT 20-100 MCG/ACT AERS respimat INHALE 1 PUFF BY MOUTH INTO THE LUNGS EVERY 6 HOURS AS NEEDED  . fluticasone (FLONASE) 50 MCG/ACT nasal spray 1 spray into each nostril twice a day (Patient not taking: Reported on 02/02/2019)  . ipratropium-albuterol (DUONEB) 0.5-2.5 (3) MG/3ML SOLN Take 3 mLs by nebulization every 6 (six) hours as needed.  . lidocaine (XYLOCAINE) 2 % solution Use as directed 15 mLs in the mouth or throat every 6 (six) hours as needed for mouth pain (for sore throat).  . ondansetron (ZOFRAN) 4 MG tablet Take 1 tablet (4 mg total) by mouth every 8 (eight) hours as needed for nausea or vomiting. (Patient not taking: Reported on 02/02/2019)  . Oxycodone HCl 10 MG TABS Take 1 tablet (10 mg total) by mouth every 4 (four) hours as needed.  . OXYGEN Inhale 3 L into the lungs daily.  [name] STIOLTO RESPIMAT  2.5-2.5 MCG/ACT AERS Inhale 2 puffs into the lungs 2 (two) times daily as needed (shortness of breath).    No facility-administered encounter medications on file as of 02/20/2019.     Objective:   Goals Addressed            This Visit's Progress     Patient Stated   . Pharm D "I want to take care of myself" (pt-stated)       Current Barriers:  . Polypharmacy; complex patient with multiple comorbidities including COPD, tobacco abuse, prostate cancer, s/p tx for Hep C . Palliative care team [name], NP involved.  . Self-manages medications" o Squamous cell carcinoma of larynx: currently following w/ [facility] to determine treatment steps. Patient discusses that he worries about "being a burden" if he were to be on a "ventilator and feeding tube" o Prostate CA: s/p Lupron treatment;  o COPD/tobacco abuse: Stiolto daily, Duonebs or Combivent as needed. Noted that he was having a difficult time getting Duoneb and Stiolto filled. Follows w/ Dr. Camillo Flaming pulmonary o S/p tx Hep C: s/p Mayvret. o Anxiety: clonazepam 1 mg BID; recently increased. Patient notes he has not picked up this dose yet.  Pharmacist Clinical Goal(s):  [name] Over the next 90 days, patient will work with PharmD and provider towards optimized medication management  Interventions: . Contacted pharmacy. Appears Duonebs requires a PA to go through Part D. This was submitted yesterday. Will follow. If denied, will see if pharmacy can run through Part B. Pharmacist notes no issues  w/ filling Stiolto. He will order today for patient to pick up later this week . Contacted patient, informed of the above. Provided empathetic listening as he discussed his recent diagnosis and goals of care.   Patient Self Care Activities:  . Patient will take medications as prescribed  Please see past updates related to this goal by clicking on the "Past Updates" button in the selected goal           Plan:  - Will outreach patient  later this month as scheduled  [name], PharmD, [place] [phone]

## 2019-02-21 ENCOUNTER — Ambulatory Visit (INDEPENDENT_AMBULATORY_CARE_PROVIDER_SITE_OTHER): Payer: Medicare Other | Admitting: Licensed Clinical Social Worker

## 2019-02-21 DIAGNOSIS — J441 Chronic obstructive pulmonary disease with (acute) exacerbation: Secondary | ICD-10-CM | POA: Diagnosis not present

## 2019-02-21 DIAGNOSIS — E440 Moderate protein-calorie malnutrition: Secondary | ICD-10-CM

## 2019-02-21 DIAGNOSIS — J449 Chronic obstructive pulmonary disease, unspecified: Secondary | ICD-10-CM

## 2019-02-21 DIAGNOSIS — F411 Generalized anxiety disorder: Secondary | ICD-10-CM

## 2019-02-21 NOTE — Chronic Care Management (AMB) (Signed)
Chronic Care Management    Clinical Social Work Follow Up Note  02/21/2019 Name: Kevin Nelson MRN: [id number] DOB: 12/21/1950  Kevin Nelson is a 69 y.o. year old male who is a primary care patient of Cannady, [name], NP. The CCM team was consulted for assistance with Mental Health Counseling and Resources.   Review of patient status, including review of consultants reports, other relevant assessments, and collaboration with appropriate care team members and the patient's provider was performed as part of comprehensive patient evaluation and provision of chronic care management services.    SDOH (Social Determinants of Health) screening performed today: Stress. See Care Plan for related entries.   Advanced Directives Status: <no information> See Care Plan for related entries.   Outpatient Encounter Medications as of 02/21/2019  Medication Sig  . clonazePAM (KLONOPIN) 1 MG tablet Take 1 tablet (1 mg total) by mouth 2 (two) times daily as needed for anxiety.  . COMBIVENT RESPIMAT 20-100 MCG/ACT AERS respimat INHALE 1 PUFF BY MOUTH INTO THE LUNGS EVERY 6 HOURS AS NEEDED  . fluticasone (FLONASE) 50 MCG/ACT nasal spray 1 spray into each nostril twice a day (Patient not taking: Reported on 02/02/2019)  . ipratropium-albuterol (DUONEB) 0.5-2.5 (3) MG/3ML SOLN Take 3 mLs by nebulization every 6 (six) hours as needed.  . lidocaine (XYLOCAINE) 2 % solution Use as directed 15 mLs in the mouth or throat every 6 (six) hours as needed for mouth pain (for sore throat).  . ondansetron (ZOFRAN) 4 MG tablet Take 1 tablet (4 mg total) by mouth every 8 (eight) hours as needed for nausea or vomiting. (Patient not taking: Reported on 02/02/2019)  . Oxycodone HCl 10 MG TABS Take 1 tablet (10 mg total) by mouth every 4 (four) hours as needed.  . OXYGEN Inhale 3 L into the lungs daily.  [name] STIOLTO RESPIMAT 2.5-2.5 MCG/ACT AERS Inhale 2 puffs into the lungs 2 (two) times daily as needed (shortness of breath).    No  facility-administered encounter medications on file as of 02/21/2019.     Goals Addressed    . SW-"I need more support. I'm anxious all the time" (pt-stated)       Current Barriers:  [name] Knowledge Deficits related to housing options/community resources that are available to him . Financial Constraints - . Health related barriers-cancerous larynx  Clinical Social Worker Clinical Goal(s):  [name] Over the next 90 days, patient will verbalize understanding of plan for gaining crisis support resource education and implementing appropriate self-care tools into his daily routine to improve overall self-care . Over the next 90 days, patient will work with [facility] LCSW to address needs related to increasing self-care and self-care education to cope with financial barriers and stressors.      Interventions: . Patient interviewed and appropriate assessments performed . Provided mental health counseling with regard to coping with daily stress. [name] LCSW provided education on deep breathing and relaxation techniques to implement into his daily routine to combat stressors. Patient admits that his anxiety continues but he has been challenging his negative thinking in order to gain a more positive perspective. Patient admits that he could benefit from socialization but due to [place] he has been staying at home and taking appropriate precautions. LCSW provided education on available mental health support resources and socialization opportunities within his community. . Provided patient with information about low income housing resources that are available. However, currently Section 8 and Public Housing wait list is still closed.  [name] LCSW provided reflective  listening and implemented appropriate interventions to help suppport patient and her emotional needs  . Discussed plans with patient for ongoing care management follow up and provided patient with direct contact information for care management team . Advised patient  to follow up with provided community resources as needed . Assisted patient/caregiver with obtaining information about health plan benefits . Patient confirms stable transportation to his upcoming PCP appointment next month. . Patient admits ongoing SOB. LCSW provided education on ways to induce the relaxation body response to help with deep breathing.  . Patient reports that his anxiety is "more manageable" due to medication dosage adjustment/increase.    Patient Self Care Activities:  . Attends all scheduled provider appointments . Calls provider office for new concerns or questions  Please see past updates related to this goal by clicking on the "Past Updates" button in the selected goal      Follow Up Plan: SW will follow up with patient by phone over the next quarter  Eula Fried, [place], MSW, [email or web address] Phone: [phone]

## 2019-02-22 ENCOUNTER — Other Ambulatory Visit: Payer: Self-pay | Admitting: *Deleted

## 2019-02-22 ENCOUNTER — Telehealth: Payer: Self-pay | Admitting: *Deleted

## 2019-02-22 MED ORDER — OXYCODONE HCL 10 MG PO TABS: 10.0000 mg | ORAL_TABLET | ORAL | 0 refills | Status: DC | PRN

## 2019-02-22 NOTE — Telephone Encounter (Signed)
I called and spoke with patient about what is going on woth [facility] surgery. Patient states he just got all the information about how surgery is going to affect his life with his breathing and having a tube for eating from now on and he is deciding if he wants the surgery or not. He said something about getting future refills from surgeon if he decides to have it or from [facility] from this point forward and appreciates you refilling this one last time

## 2019-02-23 ENCOUNTER — Telehealth: Payer: Medicare Other | Admitting: General Practice

## 2019-02-23 ENCOUNTER — Ambulatory Visit: Payer: Self-pay | Admitting: General Practice

## 2019-02-23 DIAGNOSIS — J449 Chronic obstructive pulmonary disease, unspecified: Secondary | ICD-10-CM

## 2019-02-23 DIAGNOSIS — C329 Malignant neoplasm of larynx, unspecified: Secondary | ICD-10-CM

## 2019-02-23 DIAGNOSIS — F411 Generalized anxiety disorder: Secondary | ICD-10-CM

## 2019-02-23 DIAGNOSIS — J441 Chronic obstructive pulmonary disease with (acute) exacerbation: Secondary | ICD-10-CM | POA: Diagnosis not present

## 2019-02-23 DIAGNOSIS — Z7189 Other specified counseling: Secondary | ICD-10-CM

## 2019-02-23 NOTE — Patient Instructions (Signed)
Visit Information  Goals Addressed            This Visit's Progress   . RN-COPD Management/Laryngeal Cancer diagnosis (pt-stated)       Current Barriers:  [name] Knowledge deficits related to basic COPD self care/management . Knowledge deficits related to Laryngeal Cancer and self/care management  . Financial barriers . Limited Social Support   Case Manager Clinical Goal(s):  Over the next 90 days, patient will be able to verbalize understanding of COPD action plan and when to seek appropriate levels of medical care  Over the next 90 days, patient will engage in lite exercise as tolerated to build/regain stamina and strength and reduce shortness of breath through activity tolerance  Over the next 90 days, patient will verbalize basic understanding of COPD disease process and self care activities  Over the next 90 days the patient witl verbalized basic understanding of Laryngeal Cancer disease process and self care activities    Interventions:  . Reviewed COPD action plan . Patient reports having all COPD medications . Reviewed upcoming appointment with Pulmonology.  The patient is doing some "soul searching" and talking to his fiance about important decisions that need to be made concerning his health and well being/care . Patient reporting he is taking his medications as directed . Continues to smoke, with no interest in quitting  . Continues to be on 02 . Evaluation of current status of Laryngeal cancer and treatment options.  The patient states the "doctor" has told him he only has 6 months to live.  He is unsure at this time whether he will have surgery or not. He is discussing this "big" decision with his fiance "[name]"   Patient Self Care Activities:  Takes medications as prescribed including inhalers  Utilizes infection prevention strategies to reduce risk of respiratory infection   Does not currently perform self care activities related to COPD management  Please see  past updates related to this goal by clicking on the "Past Updates" button in the selected goal      . COMPLETED: RN-I need to see my urologist (pt-stated)       Goal Met and closed 02/23/2019  Current Barriers:  [name] Knowledge Deficits related to obtaining an appointment with urology provider . Transportation barriers  Nurse Case Manager Clinical Goal(s):  [name] Over the next 30 days, patient will work with [facility] to address needs related to Urology appt and potential transportation barriers  MET  Interventions:  . Advised patient to talk with his urologist about frustrated about lingering side effects of Lupron shots decreasing his libido and sexual function.  Nash Dimmer with PCP regarding patient's expressed frustrations over decreased libido and sexual function . Discussed plans with patient for ongoing care management follow up and provided patient with direct contact information for care management team . Discussed medication options with CCM Pharm D- no good options related to patient's cardiac risk . Upcoming appointment with [facility]rology on 12/28.  Patient Self Care Activities:  . Currently UNABLE TO independently manage urology appointments related to other health issues going on  . Performs ADL's independently . Performs IADL's independently  Please see past updates related to this goal by clicking on the "Past Updates" button in the selected goal      . RN: I have some big decisions to make       Current Barriers:  . Financial Constraints.  . Chronic Disease Management support and education needs related to Chronic health conditions impacting the  patients care, currently having to decide about treatment options for Laryngeal cancer and the patients concern of "quality of life vs quantity of life" . No Advanced Directives in place  Nurse Case Manager Clinical Goal(s):  [name] Over the next 90 days, patient will work with pcp and CCM team to address needs related to prognosis of  patient with chronic medical conditions/terminal illness diagnosis . Over the next 90 days, patient will attend all scheduled medical appointments: pcp appointment on 03-05-2019 at 0845 am . Over the next 90 days, patient will demonstrate improved health management independence as evidenced byremaining independent with his care and ability to make decisions about his health and well being independently . Over the next 90 days, patient will work with CM team pharmacist to manage medications . Over the next 90 days, patient will work with CM clinical social worker to decrease stress and anxiety and assist as needed with financial needs . Over the next 90 days, the patient will work with the care management team towards completion of advanced directives  Interventions:  . Evaluation of current treatment plan related to chronic medical conditions/terminal illness diagnosis and patient's adherence to plan as established by provider. . Provided education to patient and/or caregiver about advanced directives . Provided education to patient re: giving the patient samples of Ensure to try to see if this is something that will help with his nutritional health and goals  . Reviewed medications with patient and discussed compliance . Collaborated with CCM regarding ongoing support and needs related to complex chronic medical conditions and terminal illness diagnosis . Discussed plans with patient for ongoing care management follow up and provided patient with direct contact information for care management team . Reviewed scheduled/upcoming provider appointments including: pcp appointment 03-05-2019 at [place] . Social Work referral for continued support for emotional and financial needs . Pharmacy referral for continued medication management and support  Patient Self Care Activities:  . Patient verbalizes understanding of plan to assist in management of chronic medical conditions and terminal illness  diagnosis . Self administers medications as prescribed . Attends all scheduled provider appointments . Calls provider office for new concerns or questions . Unable to independently decide the best course of action for his chronic medical condition and terminal illness diagnosis   Initial goal documentation        The patient verbalized understanding of instructions provided today and declined a print copy of patient instruction materials.   The care management team will reach out to the patient again over the next 60 days.   Noreene Larsson RN, MSN, [facility] Mobile: [phone]

## 2019-02-23 NOTE — Chronic Care Management (AMB) (Signed)
Chronic Care Management   Follow Up Note   02/23/2019 Name: Kevin Nelson MRN: [id number] DOB: 1950/09/05  Referred by: Kevin Lick, NP Reason for referral : Chronic Care Management (Follow up: COPD/Laryngeal Cancer/Chronic Health Concerns and support)   Kevin Nelson is a 69 y.o. year old male who is a primary care patient of Cannady, Kevin Faster, NP. The CCM team was consulted for assistance with chronic disease management and care coordination needs.    Review of patient status, including review of consultants reports, relevant laboratory and other test results, and collaboration with appropriate care team members and the patient's provider was performed as part of comprehensive patient evaluation and provision of chronic care management services.    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 See Care Plan for related entries.   Outpatient Encounter Medications as of 02/23/2019  Medication Sig  . clonazePAM (KLONOPIN) 1 MG tablet Take 1 tablet (1 mg total) by mouth 2 (two) times daily as needed for anxiety.  . COMBIVENT RESPIMAT 20-100 MCG/ACT AERS respimat INHALE 1 PUFF BY MOUTH INTO THE LUNGS EVERY 6 HOURS AS NEEDED  . fluticasone (FLONASE) 50 MCG/ACT nasal spray 1 spray into each nostril twice a day (Patient not taking: Reported on 02/02/2019)  . ipratropium-albuterol (DUONEB) 0.5-2.5 (3) MG/3ML SOLN Take 3 mLs by nebulization every 6 (six) hours as needed.  . lidocaine (XYLOCAINE) 2 % solution Use as directed 15 mLs in the mouth or throat every 6 (six) hours as needed for mouth pain (for sore throat).  . ondansetron (ZOFRAN) 4 MG tablet Take 1 tablet (4 mg total) by mouth every 8 (eight) hours as needed for nausea or vomiting. (Patient not taking: Reported on 02/02/2019)  . Oxycodone HCl 10 MG TABS Take 1 tablet (10 mg total) by mouth every 4 (four) hours as needed.  . OXYGEN  Inhale 3 L into the lungs daily.  Kevin Nelson STIOLTO RESPIMAT 2.5-2.5 MCG/ACT AERS Inhale 2 puffs into the lungs 2 (two) times daily as needed (shortness of breath).    No facility-administered encounter medications on file as of 02/23/2019.     Objective:   Goals Addressed            This Visit's Progress   . RN-COPD Management/Laryngeal Cancer diagnosis (pt-stated)       Current Barriers:  Kevin Nelson Knowledge deficits related to basic COPD self care/management . Knowledge deficits related to Laryngeal Cancer and self/care management  . Financial barriers . Limited Social Support   Case Manager Clinical Goal(s):  Over the next 90 days, patient will be able to verbalize understanding of COPD action plan and when to seek appropriate levels of medical care  Over the next 90 days, patient will engage in lite exercise as tolerated to build/regain stamina and strength and reduce shortness of breath through activity tolerance  Over the next 90 days, patient will verbalize basic understanding of COPD disease process and self care activities  Over the next 90 days the patient witl verbalized basic understanding of Laryngeal Cancer disease process and self care activities    Interventions:  . Reviewed COPD action plan . Patient reports having all COPD medications . Reviewed upcoming appointment with Pulmonology.  The patient is doing some "soul searching" and talking to his fiance about important decisions that need to be made concerning his health and well being/care . Patient reporting he is taking his medications as directed .  Continues to smoke, with no interest in quitting  . Continues to be on 02 . Evaluation of current status of Laryngeal cancer and treatment options.  The patient states the "doctor" has told him he only has 6 months to live.  He is unsure at this time whether he will have surgery or not. He is discussing this "big" decision with his fiance "[name]"   Patient Self Care  Activities:  Takes medications as prescribed including inhalers  Utilizes infection prevention strategies to reduce risk of respiratory infection   Does not currently perform self care activities related to COPD management  Please see past updates related to this goal by clicking on the "Past Updates" button in the selected goal      . COMPLETED: RN-I need to see my urologist (pt-stated)       Goal Met and closed 02/23/2019  Current Barriers:  Kevin Nelson Knowledge Deficits related to obtaining an appointment with urology provider . Transportation barriers  Nurse Case Manager Clinical Goal(s):  Kevin Nelson Over the next 30 days, patient will work with [facility] to address needs related to Urology appt and potential transportation barriers  MET  Interventions:  . Advised patient to talk with his urologist about frustrated about lingering side effects of Lupron shots decreasing his libido and sexual function.  Nash Dimmer with PCP regarding patient's expressed frustrations over decreased libido and sexual function . Discussed plans with patient for ongoing care management follow up and provided patient with direct contact information for care management team . Discussed medication options with CCM Pharm D- no good options related to patient's cardiac risk . Upcoming appointment with [facility] on 12/28.  Patient Self Care Activities:  . Currently UNABLE TO independently manage urology appointments related to other health issues going on  . Performs ADL's independently . Performs IADL's independently  Please see past updates related to this goal by clicking on the "Past Updates" button in the selected goal      . RN: I have some big decisions to make       Current Barriers:  . Financial Constraints.  . Chronic Disease Management support and education needs related to Chronic health conditions impacting the patients care, currently having to decide about treatment options for Laryngeal cancer and  the patients concern of "quality of life vs quantity of life" . No Advanced Directives in place  Nurse Case Manager Clinical Goal(s):  Kevin Nelson Over the next 90 days, patient will work with pcp and CCM team to address needs related to prognosis of patient with chronic medical conditions/terminal illness diagnosis . Over the next 90 days, patient will attend all scheduled medical appointments: pcp appointment on 03-05-2019 at 0845 am . Over the next 90 days, patient will demonstrate improved health management independence as evidenced byremaining independent with his care and ability to make decisions about his health and well being independently . Over the next 90 days, patient will work with CM team pharmacist to manage medications . Over the next 90 days, patient will work with CM clinical social worker to decrease stress and anxiety and assist as needed with financial needs . Over the next 90 days, the patient will work with the care management team towards completion of advanced directives  Interventions:  . Evaluation of current treatment plan related to chronic medical conditions/terminal illness diagnosis and patient's adherence to plan as established by provider. . Provided education to patient and/or caregiver about advanced directives . Provided education to patient re: giving the  patient samples of Ensure to try to see if this is something that will help with his nutritional health and goals  . Reviewed medications with patient and discussed compliance . Collaborated with CCM regarding ongoing support and needs related to complex chronic medical conditions and terminal illness diagnosis . Discussed plans with patient for ongoing care management follow up and provided patient with direct contact information for care management team . Reviewed scheduled/upcoming provider appointments including: pcp appointment 03-05-2019 at [place] . Social Work referral for continued support for emotional and  financial needs . Pharmacy referral for continued medication management and support  Patient Self Care Activities:  . Patient verbalizes understanding of plan to assist in management of chronic medical conditions and terminal illness diagnosis . Self administers medications as prescribed . Attends all scheduled provider appointments . Calls provider office for new concerns or questions . Unable to independently decide the best course of action for his chronic medical condition and terminal illness diagnosis   Initial goal documentation         Plan:   The care management team will reach out to the patient again over the next 60 days.    Noreene Larsson RN, MSN, [facility] Mobile: [phone]

## 2019-03-01 ENCOUNTER — Telehealth: Payer: Self-pay

## 2019-03-01 ENCOUNTER — Other Ambulatory Visit: Payer: Self-pay

## 2019-03-01 DIAGNOSIS — C329 Malignant neoplasm of larynx, unspecified: Secondary | ICD-10-CM

## 2019-03-01 NOTE — Progress Notes (Signed)
Referral

## 2019-03-01 NOTE — Telephone Encounter (Signed)
Called patient's [facility] ENT's doctor (Dr. Ihor Austin). I was told that patient did not want to move forward with surgery and did not have a follow up appointment with them. I then called patient and he stated the same thing. He stated that he did not want to move forward with surgery because he was not going to be on a feeding tube and had a 50% chance of survival. He stated that he would see his PCP-Mrs. Cannady and that he would see what she recommended. This was mentioned to Dr. Janese Banks and she is recommending to see him back and for him to be referred to see Dr. Baruch Gouty.

## 2019-03-01 NOTE — Telephone Encounter (Signed)
Called Dr. Consuela Mimes office and had to leave them a voicemail to return my call. We are needing to know when was his last office visit and if surgery had been scheduled and when. Then patient will need to follow up with Dr. Janese Banks.

## 2019-03-05 ENCOUNTER — Other Ambulatory Visit: Payer: Self-pay

## 2019-03-05 ENCOUNTER — Institutional Professional Consult (permissible substitution): Payer: Medicare Other | Admitting: Radiation Oncology

## 2019-03-05 ENCOUNTER — Ambulatory Visit (INDEPENDENT_AMBULATORY_CARE_PROVIDER_SITE_OTHER): Payer: Medicare Other | Admitting: Nurse Practitioner

## 2019-03-05 ENCOUNTER — Ambulatory Visit: Payer: Medicare Other | Admitting: Oncology

## 2019-03-05 ENCOUNTER — Encounter: Payer: Self-pay | Admitting: Nurse Practitioner

## 2019-03-05 VITALS — BP 110/63 | HR 93 | Temp 98.1°F

## 2019-03-05 DIAGNOSIS — F411 Generalized anxiety disorder: Secondary | ICD-10-CM | POA: Diagnosis not present

## 2019-03-05 DIAGNOSIS — C329 Malignant neoplasm of larynx, unspecified: Secondary | ICD-10-CM

## 2019-03-05 DIAGNOSIS — J449 Chronic obstructive pulmonary disease, unspecified: Secondary | ICD-10-CM

## 2019-03-05 DIAGNOSIS — F17210 Nicotine dependence, cigarettes, uncomplicated: Secondary | ICD-10-CM | POA: Diagnosis not present

## 2019-03-05 NOTE — Progress Notes (Signed)
BP 110/63   Pulse 93   Temp 98.1 F (36.7 C) (Oral)   SpO2 93%    Subjective:    Patient ID: Kevin Nelson, male    DOB: 1950-05-08, 69 y.o.   MRN: [id number]  HPI: Kevin Nelson is a 69 y.o. male  Chief Complaint  Patient presents with  . COPD  . Anxiety   COPD Followed by pulmonary, last saw at [facility] on 02/19/2019.  Continues Combivent, Stiolto, and Duoneb.  Recent diagnosis of laryngeal cancer, being followed by oncology and palliative team.  Patient does not wish total laryngectomy due to losing his voice, loves to sing and play music.  He has concerns about long term quality of life with this and prefers to avoid surgery.  Discussed at length with him, he wishes to maintain a positive attitude and focus on good thoughts.  He does agree with a chemo and radiation approach, although realizes this is not a cure.  We discussed at length today and had discussion on quality vs quantity of life, he reports preference for chemo/radiation approach as it may provide some comfort and allow him some time + allow him to focus on his positive thinking.  He did discuss hospice and wishes to pass at home when time comes.  Discussed with him that oncology center also has palliative team that is beneficial for pain control and goals of care discussions, which he is interested in. COPD status: stable Satisfied with current treatment?: yes Oxygen use: yes Dyspnea frequency: occasional Cough frequency: chronic, no worsening Rescue inhaler frequency:  3-4 times a week, sometimes more Limitation of activity: at times Productive cough: none Last Spirometry: with pulmonary Pneumovax: refused Influenza: refused   ANXIETY/STRESS Followed by palliative team at this time due to co morbidities, new diagnosis laryngeal CA.  Is taking Klonopin 1 MG BID PRN, recent increase after palliative assessment due to increased anxiety with new CA diagnosis.  He reports benefit from this regimen.  Risks, benefits, side  effects and alternative therapies were discussed.  The opportunity to ask questions was provided and they were answered to the best of my ability.  The patient expressed understanding and willingness to follow the outlined treatment protocols and was able to verbalize plan of care back to provider. Duration:stable Anxious mood: yes  Excessive worrying: yes Irritability: no  Sweating: no Nausea: no Palpitations:no Hyperventilation: no Panic attacks: no Agoraphobia: no  Obscessions/compulsions: no Depressed mood: yes Depression screen [facility] 2/9 03/05/2019 01/10/2019 07/31/2018 01/27/2018 01/13/2018  Decreased Interest 3 0 3 1 1   Down, Depressed, Hopeless 3 1 3 2 2   PHQ - 2 Score 6 1 6 3 3   Altered sleeping 3 - 3 3 3   Tired, decreased energy 3 - 3 2 2   Change in appetite 1 - 3 2 2   Feeling bad or failure about yourself  0 - 0 0 0  Trouble concentrating 0 - 1 0 0  Moving slowly or fidgety/restless 0 - 3 0 0  Suicidal thoughts 1 - 0 0 0  PHQ-9 Score 14 - 19 10 10   Difficult doing work/chores Somewhat difficult - Somewhat difficult Somewhat difficult -   Anhedonia: no Weight changes: no Insomnia: yes hard to fall asleep  Hypersomnia: no Fatigue/loss of energy: no Feelings of worthlessness: no Feelings of guilt: no Impaired concentration/indecisiveness: yes Suicidal ideations: no  Crying spells: no Recent Stressors/Life Changes: no   Relationship problems: no   Family stress: no     Financial stress: no  Job stress: no    Recent death/loss: no  Relevant past medical, surgical, family and social history reviewed and updated as indicated. Interim medical history since our last visit reviewed. Allergies and medications reviewed and updated.  Review of Systems  Constitutional: Negative for activity change, diaphoresis, fatigue and fever.  Respiratory: Positive for cough (baseline) and wheezing (baseline). Negative for chest tightness and shortness of breath.   Cardiovascular:  Negative for chest pain, palpitations and leg swelling.  Gastrointestinal: Negative.   Psychiatric/Behavioral: Negative.     Per HPI unless specifically indicated above     Objective:    BP 110/63   Pulse 93   Temp 98.1 F (36.7 C) (Oral)   SpO2 93%   Wt Readings from Last 3 Encounters:  01/23/19 117 lb (53.1 kg)  01/17/19 112 lb (50.8 kg)  01/10/19 112 lb (50.8 kg)    Physical Exam Vitals and nursing note reviewed.  Constitutional:      General: He is awake. He is not in acute distress.    Appearance: He is well-developed and underweight. He is not ill-appearing.  HENT:     Head: Normocephalic and atraumatic.     Right Ear: Hearing normal. No drainage.     Left Ear: Hearing normal. No drainage.  Eyes:     General: Lids are normal.        Right eye: No discharge.        Left eye: No discharge.     Conjunctiva/sclera: Conjunctivae normal.     Pupils: Pupils are equal, round, and reactive to light.  Neck:     Vascular: No carotid bruit.  Cardiovascular:     Rate and Rhythm: Normal rate and regular rhythm.     Heart sounds: Normal heart sounds, S1 normal and S2 normal. No murmur. No gallop.   Pulmonary:     Effort: Pulmonary effort is normal. No accessory muscle usage or respiratory distress.     Breath sounds: Decreased breath sounds present.     Comments: Decreased breath sounds throughout, clear.  O2 on at 2L [place]. Abdominal:     General: Bowel sounds are normal.     Palpations: Abdomen is soft.  Musculoskeletal:        General: Normal range of motion.     Cervical back: Normal range of motion and neck supple.     Right lower leg: No edema.     Left lower leg: No edema.  Skin:    General: Skin is warm and dry.  Neurological:     Mental Status: He is alert and oriented to person, place, and time.     Deep Tendon Reflexes: Reflexes are normal and symmetric.  Psychiatric:        Mood and Affect: Mood normal.        Behavior: Behavior normal. Behavior is  cooperative.        Thought Content: Thought content normal.        Judgment: Judgment normal.     Results for orders placed or performed during the hospital encounter of 01/30/19  Glucose, capillary  Result Value Ref Range   Glucose-Capillary 91 70 - 99 mg/dL      Assessment & Plan:   Problem List Items Addressed This Visit      Respiratory   COPD, very severe ([place])    Chronic, ongoing with O2 dependence.  Continue current medication regimen and collaboration with pulmonology.  Recommend complete smoking cessation, although not interested in this at  this time.      Laryngeal cancer ([place]) - Primary    New diagnosis.  Continue collaboration with oncology.  Sent secure message to Dr. Janese Banks to alert her of patient wishes for chemo/radiation, is aware this is not a cure but more palliative approach, which he agrees with.  Continue collaboration with oncology.        Other   Generalized anxiety disorder    Chronic, ongoing.  Denies SI/HI.  Will continue current palliative regimen.  Patient aware of long term risks of benzos, wishes to continue at this time.  Recent refills sent.  Continue to collaborate with palliative team.  Return in 3 months.      Nicotine dependence, cigarettes, uncomplicated    I have recommended complete cessation of tobacco use. I have discussed various options available for assistance with tobacco cessation including over the counter methods (Nicotine gum, patch and lozenges). We also discussed prescription options (Chantix, Nicotine Inhaler / Nasal Spray). The patient is not interested in pursuing any prescription tobacco cessation options at this time.           Follow up plan: Return in about 3 months (around 06/02/2019) for Anxiety and CA.

## 2019-03-05 NOTE — Assessment & Plan Note (Signed)
I have recommended complete cessation of tobacco use. I have discussed various options available for assistance with tobacco cessation including over the counter methods (Nicotine gum, patch and lozenges). We also discussed prescription options (Chantix, Nicotine Inhaler / Nasal Spray). The patient is not interested in pursuing any prescription tobacco cessation options at this time.  

## 2019-03-05 NOTE — Patient Instructions (Signed)

## 2019-03-05 NOTE — Assessment & Plan Note (Signed)
Chronic, ongoing.  Denies SI/HI.  Will continue current palliative regimen.  Patient aware of long term risks of benzos, wishes to continue at this time.  Recent refills sent.  Continue to collaborate with palliative team.  Return in 3 months. 

## 2019-03-05 NOTE — Assessment & Plan Note (Signed)
New diagnosis.  Continue collaboration with oncology.  Sent secure message to Dr. Janese Banks to alert her of patient wishes for chemo/radiation, is aware this is not a cure but more palliative approach, which he agrees with.  Continue collaboration with oncology.

## 2019-03-05 NOTE — Assessment & Plan Note (Signed)
Chronic, ongoing with O2 dependence.  Continue current medication regimen and collaboration with pulmonology.  Recommend complete smoking cessation, although not interested in this at this time. 

## 2019-03-06 ENCOUNTER — Telehealth: Payer: Self-pay | Admitting: Adult Health Nurse Practitioner

## 2019-03-06 NOTE — Telephone Encounter (Signed)
Called to set up appointment.  Set up appointment for 03/08/2019 at 11:30 Kevin Brazeau K. Olena Heckle NP

## 2019-03-07 ENCOUNTER — Ambulatory Visit: Payer: Medicare Other | Admitting: Pharmacist

## 2019-03-07 DIAGNOSIS — J449 Chronic obstructive pulmonary disease, unspecified: Secondary | ICD-10-CM

## 2019-03-07 NOTE — Patient Instructions (Signed)
Visit Information  Goals Addressed            This Visit's Progress     Patient Stated   . Pharm D "I want to take care of myself" (pt-stated)       Current Barriers:  . Polypharmacy; complex patient with multiple comorbidities including COPD, tobacco abuse, prostate cancer, s/p tx for Hep C . Palliative care team [name], NP involved. Appointment tomorrow. Patient notes that he is extremely appreciative of this service.  . Self-manages medications" o Squamous cell carcinoma of larynx: seen by [facility] pulm/ENT, patient uncertain about whether to pursue any tx or not. Has appt 03/09/19 w/ [facility]  o Prostate CA: s/p Lupron treatment;  o COPD/tobacco abuse: Stiolto daily, Duonebs or Combivent as needed. Follows w/ Dr. Camillo Flaming pulmonary o S/p tx Hep C: s/p Mayvret. o Anxiety: clonazepam 1 mg BID; recently increased.  Pharmacist Clinical Goal(s):  [name] Over the next 90 days, patient will work with PharmD and provider towards optimized medication management  Interventions: . Contacted patient. He notes that previous insurance concerns were taken care of, and he was able to get his inhaler therapy at the pharmacy.  . Reviewed upcoming appointments.  . Provided empathetic listening as patient discussed his worries about upcoming appointments w/ oncology. Reiterated CCM team support  Patient Self Care Activities:  . Patient will take medications as prescribed  Please see past updates related to this goal by clicking on the "Past Updates" button in the selected goal          Patient verbalizes understanding of instructions provided today.   Plan:  - Scheduled f/u call 05/09/19  [name], PharmD, [place] [phone]

## 2019-03-07 NOTE — Chronic Care Management (AMB) (Signed)
Chronic Care Management   Follow Up Note   03/07/2019 Name: Kevin Nelson MRN: [id number] DOB: 05-15-1950  Referred by: [name], NP Reason for referral : Chronic Care Management (Medication Management)   Kevin Nelson is a 69 y.o. year old male who is a primary care patient of Cannady, [name], NP. The CCM team was consulted for assistance with chronic disease management and care coordination needs.    Contacted patient for medication management review.   Review of patient status, including review of consultants reports, relevant laboratory and other test results, and collaboration with appropriate care team members and the patient's provider was performed as part of comprehensive patient evaluation and provision of chronic care management services.    SDOH (Social Determinants of Health) screening performed today: Stress. See Care Plan for related entries.   Outpatient Encounter Medications as of 03/07/2019  Medication Sig  . ipratropium-albuterol (DUONEB) 0.5-2.5 (3) MG/3ML SOLN Take 3 mLs by nebulization every 6 (six) hours as needed.  [name] STIOLTO RESPIMAT 2.5-2.5 MCG/ACT AERS Inhale 2 puffs into the lungs 2 (two) times daily as needed (shortness of breath).   . clonazePAM (KLONOPIN) 1 MG tablet Take 1 tablet (1 mg total) by mouth 2 (two) times daily as needed for anxiety.  . COMBIVENT RESPIMAT 20-100 MCG/ACT AERS respimat INHALE 1 PUFF BY MOUTH INTO THE LUNGS EVERY 6 HOURS AS NEEDED  . fluticasone (FLONASE) 50 MCG/ACT nasal spray 1 spray into each nostril twice a day (Patient not taking: Reported on 02/02/2019)  . lidocaine (XYLOCAINE) 2 % solution Use as directed 15 mLs in the mouth or throat every 6 (six) hours as needed for mouth pain (for sore throat). (Patient not taking: Reported on 03/05/2019)  . ondansetron (ZOFRAN) 4 MG tablet Take 1 tablet (4 mg total) by mouth every 8 (eight) hours as needed for nausea or vomiting. (Patient not taking: Reported on 02/02/2019)  . Oxycodone  HCl 10 MG TABS Take 1 tablet (10 mg total) by mouth every 4 (four) hours as needed.  . OXYGEN Inhale 3 L into the lungs daily.   No facility-administered encounter medications on file as of 03/07/2019.     Objective:   Goals Addressed            This Visit's Progress     Patient Stated   . Pharm D "I want to take care of myself" (pt-stated)       Current Barriers:  . Polypharmacy; complex patient with multiple comorbidities including COPD, tobacco abuse, prostate cancer, s/p tx for Hep C . Palliative care team [name], NP involved. Appointment tomorrow. Patient notes that he is extremely appreciative of this service.  . Self-manages medications" o Squamous cell carcinoma of larynx: seen by [facility] pulm/ENT, patient uncertain about whether to pursue any tx or not. Has appt 03/09/19 w/ [facility]  o Prostate CA: s/p Lupron treatment;  o COPD/tobacco abuse: Stiolto daily, Duonebs or Combivent as needed. Follows w/ Dr. Camillo Flaming pulmonary o S/p tx Hep C: s/p Mayvret. o Anxiety: clonazepam 1 mg BID; recently increased.  Pharmacist Clinical Goal(s):  [name] Over the next 90 days, patient will work with PharmD and provider towards optimized medication management  Interventions: . Contacted patient. He notes that previous insurance concerns were taken care of, and he was able to get his inhaler therapy at the pharmacy.  . Reviewed upcoming appointments.  . Provided empathetic listening as patient discussed his worries about upcoming appointments w/ oncology. Reiterated CCM team support  Patient Self Care Activities:  . Patient will take medications as prescribed  Please see past updates related to this goal by clicking on the "Past Updates" button in the selected goal           Plan:  - Scheduled f/u call 05/09/19  [name], PharmD, [place] [phone]

## 2019-03-08 ENCOUNTER — Encounter: Payer: Self-pay | Admitting: Oncology

## 2019-03-08 ENCOUNTER — Other Ambulatory Visit: Payer: Medicare Other | Admitting: Adult Health Nurse Practitioner

## 2019-03-08 ENCOUNTER — Other Ambulatory Visit: Payer: Self-pay

## 2019-03-08 DIAGNOSIS — Z515 Encounter for palliative care: Secondary | ICD-10-CM

## 2019-03-08 DIAGNOSIS — C329 Malignant neoplasm of larynx, unspecified: Secondary | ICD-10-CM | POA: Diagnosis not present

## 2019-03-08 NOTE — Progress Notes (Signed)
[facility] Consult Note Telephone: [phone]  Fax: [phone]  PATIENT NAME: Kevin Nelson DOB: 08-Jul-1950 MRN: [id number]  PRIMARY CARE PROVIDER:   [name], NP  REFERRING PROVIDER:  [name], NP [address] [place],  [place] [address]  RESPONSIBLE PARTY:   Self [phone]       RECOMMENDATIONS and PLAN:  1.  Advanced care planning. Listed as DNR in Epic  2.  Squamous cell carcinoma of larynx.  Patient was recommended to have surgery for removal of several structures in the throat that would require trach and PEG tube after the surgery and he would not be able to speak.  Patient felt like this would lead to a poor quality of life.  Has opted for palliative chemo and radiation.  Have appointment with Dr. Janese Banks tomorrow to discuss further.  Patient and fiancee have questions about symptoms, such as decreased appetite, during treatment.  Discussed that with a lot of symptoms there are things we can try to help manage them while patient in on cancer therapy.  Patient and fiancee understand this is palliative but could still add some time to his life.  Reassured them that palliative will be here to offer support during this time and help coordinate care to relieve symptoms as much as possible.  He is trying to quit smoking and fiancee notices some increased anxiety with this process.  He does have klonopin 1 mg BID PRN that does give him relief with his anxiety.  He does take it every night but does not always take the second dose.  Encouraged to take a half tab or full tab on days when he is feeling more anxious, especially while he is trying to quit smoking.  He also had questions about his prostate cancer recurrence and encouraged to bring that up with Dr. Janese Banks at his appointment tomorrow.  Palliative care will continue to monitor for symptom management and make recommendations as needed.  Will call patient in one month to set up next  appointment.  Encouraged to call with any concerns prior to that.  I spent 60 minutes providing this consultation, including time with patient/family, chart review, provider coordination, and documentation. More than 50% of the time in this consultation was spent coordinating communication.   HISTORY OF PRESENT ILLNESS:  Kevin Nelson is a 69 y.o. year old male with multiple medical problems including severe stage 3 COPD,larynx cancer, prostate cancer, skin cancer, CAD, chronic hepatitis. Palliative Care was asked to help address goals of care.   CODE STATUS: see above  PPS: 60% HOSPICE ELIGIBILITY/DIAGNOSIS: TBD  PHYSICAL EXAM:   General: NAD, frail appearing, thin Extremities: no edema, no joint deformities Skin: no rashes on exposed skin Neurological: Weakness but otherwise nonfocal   PAST MEDICAL HISTORY:  Past Medical History:  Diagnosis Date  . Anxiety   . Basal cell carcinoma   . Cigarette smoker   . Dental decay   . Dysphagia   . Dyspnea   . End stage COPD ([place])    end stage copd, emphysema  . Erectile dysfunction   . Headache    every morning  . Hepatitis C    treated for 8 weeks this past year with Mayvret  . History of nonmelanoma skin cancer   . Hoarseness, chronic   . Myocardial infarction [facility]) march/april 2020   mild heart attack  . Prostate cancer ([place])   . Respiratory failure, acute ([place])  was inpt for 5 days. thought he was going to die.  . Sleep apnea   . Squamous cell skin cancer   . Throat cancer ([place])   . Use of leuprolide acetate (Lupron)    treated for prostate cancer.   . 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 Smoking status: Current Every Day Smoker    Packs/day: 0.25    Years: 52.00    Pack years: 13.00    Types: Cigarettes  . Smokeless tobacco: Never Used  Substance Use Topics  . Alcohol use: Yes    Alcohol/week: 21.0 standard drinks    Types: 21 Cans of beer per  week    Comment: 3 a day     ALLERGIES: No Known Allergies   PERTINENT MEDICATIONS:  Outpatient Encounter Medications as of 03/08/2019  Medication Sig  . clonazePAM (KLONOPIN) 1 MG tablet Take 1 tablet (1 mg total) by mouth 2 (two) times daily as needed for anxiety.  . COMBIVENT RESPIMAT 20-100 MCG/ACT AERS respimat INHALE 1 PUFF BY MOUTH INTO THE LUNGS EVERY 6 HOURS AS NEEDED  . fluticasone (FLONASE) 50 MCG/ACT nasal spray 1 spray into each nostril twice a day (Patient not taking: Reported on 03/08/2019)  . ipratropium-albuterol (DUONEB) 0.5-2.5 (3) MG/3ML SOLN Take 3 mLs by nebulization every 6 (six) hours as needed.  . lidocaine (XYLOCAINE) 2 % solution Use as directed 15 mLs in the mouth or throat every 6 (six) hours as needed for mouth pain (for sore throat).  . ondansetron (ZOFRAN) 4 MG tablet Take 1 tablet (4 mg total) by mouth every 8 (eight) hours as needed for nausea or vomiting.  . Oxycodone HCl 10 MG TABS Take 1 tablet (10 mg total) by mouth every 4 (four) hours as needed.  . OXYGEN Inhale 3 L into the lungs daily.  [name] STIOLTO RESPIMAT 2.5-2.5 MCG/ACT AERS Inhale 2 puffs into the lungs 2 (two) times daily as needed (shortness of breath).    No facility-administered encounter medications on file as of 03/08/2019.     Alexi Dorminey Jenetta Downer, NP

## 2019-03-08 NOTE — Progress Notes (Signed)
Patient would like to know what will be his treatment plan. Patient continues to have throat pain which makes it harder for him to swallow and eat.

## 2019-03-09 ENCOUNTER — Other Ambulatory Visit: Payer: Self-pay

## 2019-03-09 ENCOUNTER — Inpatient Hospital Stay: Payer: Medicare Other | Attending: Oncology | Admitting: Oncology

## 2019-03-09 ENCOUNTER — Encounter (INDEPENDENT_AMBULATORY_CARE_PROVIDER_SITE_OTHER): Payer: Self-pay

## 2019-03-09 ENCOUNTER — Ambulatory Visit
Admission: RE | Admit: 2019-03-09 | Discharge: 2019-03-09 | Disposition: A | Discharge status: Home / Self Care | Payer: Medicare Other | Source: Ambulatory Visit | Attending: Radiation Oncology | Admitting: Radiation Oncology

## 2019-03-09 VITALS — BP 118/69 | HR 92 | Temp 96.2°F | Ht 66.0 in | Wt 105.6 lb

## 2019-03-09 DIAGNOSIS — C329 Malignant neoplasm of larynx, unspecified: Secondary | ICD-10-CM | POA: Diagnosis not present

## 2019-03-09 DIAGNOSIS — C321 Malignant neoplasm of supraglottis: Secondary | ICD-10-CM | POA: Diagnosis not present

## 2019-03-09 DIAGNOSIS — R634 Abnormal weight loss: Secondary | ICD-10-CM | POA: Diagnosis not present

## 2019-03-09 DIAGNOSIS — I252 Old myocardial infarction: Secondary | ICD-10-CM | POA: Insufficient documentation

## 2019-03-09 DIAGNOSIS — J029 Acute pharyngitis, unspecified: Secondary | ICD-10-CM | POA: Insufficient documentation

## 2019-03-09 DIAGNOSIS — G893 Neoplasm related pain (acute) (chronic): Secondary | ICD-10-CM | POA: Diagnosis not present

## 2019-03-09 DIAGNOSIS — F419 Anxiety disorder, unspecified: Secondary | ICD-10-CM | POA: Diagnosis not present

## 2019-03-09 DIAGNOSIS — F17210 Nicotine dependence, cigarettes, uncomplicated: Secondary | ICD-10-CM | POA: Diagnosis not present

## 2019-03-09 DIAGNOSIS — Z7189 Other specified counseling: Secondary | ICD-10-CM | POA: Diagnosis not present

## 2019-03-09 DIAGNOSIS — Z79899 Other long term (current) drug therapy: Secondary | ICD-10-CM | POA: Diagnosis not present

## 2019-03-09 DIAGNOSIS — G4730 Sleep apnea, unspecified: Secondary | ICD-10-CM | POA: Insufficient documentation

## 2019-03-09 DIAGNOSIS — R1310 Dysphagia, unspecified: Secondary | ICD-10-CM | POA: Insufficient documentation

## 2019-03-09 DIAGNOSIS — J449 Chronic obstructive pulmonary disease, unspecified: Secondary | ICD-10-CM | POA: Diagnosis not present

## 2019-03-09 DIAGNOSIS — Z85820 Personal history of malignant melanoma of skin: Secondary | ICD-10-CM | POA: Insufficient documentation

## 2019-03-09 DIAGNOSIS — Z85828 Personal history of other malignant neoplasm of skin: Secondary | ICD-10-CM | POA: Insufficient documentation

## 2019-03-09 DIAGNOSIS — Z809 Family history of malignant neoplasm, unspecified: Secondary | ICD-10-CM | POA: Insufficient documentation

## 2019-03-09 DIAGNOSIS — C61 Malignant neoplasm of prostate: Secondary | ICD-10-CM | POA: Insufficient documentation

## 2019-03-09 DIAGNOSIS — Z8049 Family history of malignant neoplasm of other genital organs: Secondary | ICD-10-CM | POA: Insufficient documentation

## 2019-03-09 MED ORDER — OXYCODONE HCL 10 MG PO TABS: 10.0000 mg | ORAL_TABLET | ORAL | 0 refills | Status: DC | PRN

## 2019-03-09 NOTE — Consult Note (Signed)
NEW PATIENT EVALUATION  Name: Kevin Nelson  MRN: [id number]  Date:   03/09/2019     DOB: August 24, 1950   This 69 y.o. 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 Cancer    Initial consultation    DIAGNOSIS: The encounter diagnosis was Laryngeal cancer ([place]).   PREVIOUS INVESTIGATIONS:  PET/CT and CT scans reviewed Clinical notes reviewed Pathology report reviewed  HPI: Patient is a 69 year old male who presents with a several year history of increasing throat pain progressing to some dysphagia.  Eventually was seen by ENT who performed upper laryngoscopy showing a friable mass involving the false vocal cord but did not extend to the true vocal cords.  Vocal cords were mobile.  CT scan of the neck showed a 3.3 x 1.7 x 3.5 cm mass thought to involving both the true and false cords and extending anteriorly to involve the anterior commissure.  There is also invasion of the preepiglottic space as well as a cystic necrotic level 3/4 node.  Biopsy was positive and invasive squamous cell carcinoma moderately differentiated focally keratinizing.  PET/CT confirmed hyperbolic mass in the left glottis extending anteriorly commissure.  Also hyperbolic left level 2 metastatic lymph node.  There was invasion of the thyroid cartilage.  He was seen at [facility] by ENT.  They offered total laryngectomy.  Patient does have significant emphysema COPD and surgery may be difficult.  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  He is now referred to radiation oncology for consideration of treatment.  He is accompanied by his partner today.  He is doing fairly well although still complains of some dysphagia weight loss and sore throat.  Patient will has a history of  prostate cancer on intermittent androgen deprivation therapy.  Not completely staged.  We will get a bone scan in the future and possible CT scan of abdomen pelvis for further clarification.  PLANNED TREATMENT REGIMEN: Concurrent chemoradiation  PAST MEDICAL HISTORY:  has a past medical history of Anxiety, Basal cell carcinoma, Cigarette smoker, Dental decay, Dysphagia, Dyspnea, End stage COPD ([place]), Erectile dysfunction, Headache, Hepatitis C, History of nonmelanoma skin cancer, Hoarseness, chronic, Myocardial infarction ([place]) (march/april 2020), Prostate cancer ([place]), Respiratory failure, acute ([place]), Sleep apnea, Squamous cell skin cancer, Throat cancer ([place]), Use of leuprolide acetate (Lupron), and Vocal cord mass (12/2018).    PAST SURGICAL HISTORY:  Past Surgical History:  Procedure Laterality Date  . FACIAL LACERATION REPAIR    . HERNIA REPAIR Left 1992   inguinal  . MICROLARYNGOSCOPY N/A 01/17/2019   Procedure: MICROLARYNGOSCOPY WITH BX;  Surgeon: Clyde Canterbury, MD;  Location: [facility] ORS;  Service: ENT;  Laterality: N/A;  . PROSTATE BIOPSY    . SKIN CANCER EXCISION    . TRIGGER FINGER RELEASE Left 10/2018    FAMILY HISTORY: family history includes Cancer in his sister; Cancer - Cervical in his mother; Heart attack in his brother and maternal grandfather.  SOCIAL HISTORY:  reports that he has been smoking cigarettes. He has a 13.00 pack-year smoking history. He has never used smokeless tobacco. He reports current alcohol use of about 21.0 standard drinks of alcohol per week. He reports that he does not use drugs.  ALLERGIES: Patient has no known allergies.  MEDICATIONS:  Current Outpatient Medications  Medication Sig Dispense Refill  . clonazePAM (KLONOPIN) 1 MG tablet Take 1 tablet (1 mg total) by mouth 2 (two) times daily as needed for anxiety. 60 tablet 2  . COMBIVENT RESPIMAT 20-100 MCG/ACT AERS respimat INHALE 1 PUFF BY MOUTH INTO THE LUNGS EVERY 6 HOURS AS NEEDED 4 g 2  .  fluticasone (FLONASE) 50 MCG/ACT nasal spray 1 spray into each nostril twice a day (Patient not taking: Reported on 03/08/2019) 16 g 2  . ipratropium-albuterol (DUONEB) 0.5-2.5 (3) MG/3ML SOLN Take 3 mLs by nebulization every 6 (six) hours as needed. 360 mL 11  . lidocaine (XYLOCAINE) 2 % solution Use as directed 15 mLs in the mouth or throat every 6 (six) hours as needed for mouth pain (for sore throat). 100 mL 2  . ondansetron (ZOFRAN) 4 MG tablet Take 1 tablet (4 mg total) by mouth every 8 (eight) hours as needed for nausea or vomiting. 30 tablet 0  . Oxycodone HCl 10 MG TABS Take 1 tablet (10 mg total) by mouth every 4 (four) hours as needed. 60 tablet 0  . OXYGEN Inhale 3 L into the lungs daily.    [name] STIOLTO RESPIMAT 2.5-2.5 MCG/ACT AERS Inhale 2 puffs into the lungs 2 (two) times daily as needed (shortness of breath).      No current facility-administered medications for this encounter.    ECOG PERFORMANCE STATUS:  1 - Symptomatic but completely ambulatory  REVIEW OF SYSTEMS: Patient denies any weight loss, fatigue, weakness, fever, chills or night sweats. Patient denies any loss of vision, blurred vision. Patient denies any ringing  of the ears or hearing loss. No irregular heartbeat. Patient denies heart murmur or history of fainting. Patient denies any chest pain or pain radiating to her upper extremities. Patient denies any shortness of breath, difficulty breathing at night, cough or hemoptysis. Patient denies any swelling in the lower legs. Patient denies any nausea vomiting, vomiting of blood, or coffee ground material in the vomitus. Patient denies any stomach pain. Patient states has had normal bowel movements no significant constipation or diarrhea. Patient denies any dysuria, hematuria or significant nocturia. Patient denies any problems walking, swelling in the joints or loss of balance. Patient denies any skin changes, loss of hair or loss of weight. Patient denies any excessive  worrying or anxiety or significant depression. Patient denies any problems with insomnia. Patient denies excessive thirst, polyuria, polydipsia. Patient denies any swollen glands, patient denies easy bruising or easy bleeding. Patient denies any recent infections, allergies or URI. Patient "s visual fields have not changed significantly in recent time.   PHYSICAL EXAM: There were no vitals taken for this visit. No cervical or supraclavicular adenopathy is detected.  There is some fullness in the left high cervical chain.  Well-developed well-nourished patient in NAD. HEENT reveals PERLA, EOMI, discs not visualized.  Oral cavity is clear. No oral mucosal lesions are identified. Neck is clear without evidence of cervical or supraclavicular adenopathy. Lungs are clear to A&P. Cardiac examination is essentially unremarkable with regular rate and rhythm without murmur rub or thrill. Abdomen is benign with no organomegaly or masses noted. Motor sensory and DTR levels are equal and symmetric in the upper and lower extremities. Cranial nerves II through XII are grossly intact. Proprioception is intact. No peripheral adenopathy or edema is identified. No motor or sensory levels are noted. Crude visual fields are within normal range.  LABORATORY DATA: Pathology report reviewed    RADIOLOGY RESULTS: PET/CT and  CT scans reviewed compatible with above-stated findings   IMPRESSION: Stage IV squamous cell carcinoma of the supraglottic larynx in 69 year old male for concurrent chemoradiation.  PLAN: At this time elected to have with concurrent chemoradiation.  I would plan on delivering 7000 cGy to the area of primary tumor involvement as well as necrotic hypermetabolic neck node.  We treat his other lymph nodes in the neck to 5400 cGy using IMRT treatment planning and delivery.  Risks and benefits of treatment including increasing sore throat skin reaction fatigue dysphagia loss of taste alteration of blood counts  all were discussed in detail with the patient.  He seems to comprehend my treatment plan well.  I have personally set up and ordered CT simulation for next week.  We will also investigate after completion of his radiation his prostate cancer may run bone scan as well as CT scan of abdomen pelvis for clarification of his true stage.  Patient comprehensive treatment plan well.  I would like to take this opportunity to thank you for allowing me to participate in the care of your patient.Noreene Filbert, MD

## 2019-03-11 MED ORDER — DEXAMETHASONE 4 MG PO TABS: ORAL_TABLET | ORAL | 1 refills | Status: DC

## 2019-03-11 MED ORDER — ONDANSETRON HCL 8 MG PO TABS: 8.0000 mg | ORAL_TABLET | Freq: Two times a day (BID) | ORAL | 1 refills | Status: DC | PRN

## 2019-03-11 MED ORDER — PROCHLORPERAZINE MALEATE 10 MG PO TABS: 10.0000 mg | ORAL_TABLET | Freq: Four times a day (QID) | ORAL | 1 refills | Status: DC | PRN

## 2019-03-11 NOTE — Progress Notes (Signed)
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 Richardson Landry with symptoms of difficulty swallowing and persistent sore throat which has been ongoing for the last 1 year.NPL exam showed afriable mass involving the false vocal cord but did not involve the true vocal cords. True vocal cords remain mobile on exam. Hypopharynx and tongue base was clear.   This was followed by CT soft tissue neck which showed a 3.3 x 1.7 x 3.5 cm mass which was thought to be involving both the true and false vocal cords and extending anteriorly to involve the anterior commissure. Mass extends to involve the paraglottic space and erodes through the thyroid cartilage anteriorly and left of midline. There is also invasion of the preepiglottic space inferiorly. Enlarged cystic necrotic level 3/4 lymph node measuring 1.6 x 1.2 cm. Findings are consistent with nodal metastatic disease. Biopsy of the mass showed squamous cell carcinoma.   PET/CT showed hyppermetabolic mass extending from left glottis to anterior commisure. Solitary left level 2 metastatic Lymph node. No distant metastatic disease  Patient met with [facility] and decided against total laryngectomy. Plan is for  concurrent chemo/RT +/- chemotherapy following chemo/RT   Interval history- patient reports currently pain is well controlled with oxycodone and he does not want to increase the dose yet. He does find it hard to swallow and speak. He is trying to keep up with PO intake  ECOG PS- 2 Pain scale- 3 Opioid associated constipation- no  Review of systems- Review of Systems  Constitutional: Positive for malaise/fatigue. Negative for chills, fever and weight loss.  HENT: Negative for congestion, ear discharge and nosebleeds.   Eyes: Negative for blurred vision.  Respiratory: Negative for cough, hemoptysis, sputum production, shortness of breath and wheezing.   Cardiovascular: Negative for chest pain, palpitations, orthopnea and claudication.  Gastrointestinal: Negative for abdominal pain, blood in stool, constipation, diarrhea, heartburn, melena, nausea and vomiting.       Pain and difficulty swallowing  Genitourinary: Negative for dysuria, flank pain, frequency, hematuria and urgency.  Musculoskeletal: Negative for back pain, joint pain and myalgias.  Skin: Negative for rash.  Neurological: Negative for dizziness, tingling, focal weakness, seizures, weakness and headaches.  Endo/Heme/Allergies: Does not bruise/bleed easily.  Psychiatric/Behavioral: Negative for depression and suicidal ideas. The patient does not have insomnia.       No Known Allergies   Past Medical History:  Diagnosis Date  . Anxiety   . Basal cell carcinoma   . Cigarette smoker   . Dental decay   . Dysphagia   . Dyspnea   . End stage COPD ([place])    end stage copd, emphysema  . Erectile dysfunction   . Headache    every morning  . Hepatitis C    treated for 8 weeks this past year with Mayvret  .  History of nonmelanoma skin cancer   . Hoarseness, chronic   . Myocardial infarction [facility]) march/april 2020   mild heart attack  . Prostate cancer ([place])   . Respiratory failure, acute ([place])    was inpt for 5 days.  thought he was going to die.  . Sleep apnea   . Squamous cell skin cancer   . Throat cancer ([place])   . Use of leuprolide acetate (Lupron)    treated for prostate cancer.   . 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     Past Surgical History:  Procedure Laterality Date  . FACIAL LACERATION REPAIR    . HERNIA REPAIR Left 1992   inguinal  . MICROLARYNGOSCOPY N/A 01/17/2019   Procedure: MICROLARYNGOSCOPY WITH BX;  Surgeon: Clyde Canterbury, MD;  Location: [facility] ORS;  Service: ENT;  Laterality: N/A;  . PROSTATE BIOPSY    . SKIN CANCER EXCISION    . TRIGGER FINGER RELEASE Left 10/2018    Social History   Socioeconomic History  . Marital status: Significant Other    Spouse name: [name], girlfriend  . Number of children: Not on file  . Years of education: Not on file  . Highest education level: Not on file  Occupational History  . Occupation: Chief Strategy Officer    Comment: retired  Tobacco Use  . Smoking status: Current Every Day Smoker    Packs/day: 0.25    Years: 52.00    Pack years: 13.00    Types: Cigarettes  . Smokeless tobacco: Never Used  Substance and Sexual Activity  . Alcohol use: Yes    Alcohol/week: 21.0 standard drinks    Types: 21 Cans of beer per week    Comment: 3 a day   . Drug use: Never  . Sexual activity: Yes  Other Topics Concern  . Not on file  Social History Narrative   Patient has retired d/t respiratory status. Currently lives with girlfriend    Social Determinants of Health   Financial Resource Strain: Low Risk   . Difficulty of Paying Living Expenses: Not hard at all  Food Insecurity: No Food Insecurity  . Worried About Charity fundraiser in the Last Year: Never true  . Ran Out of Food in the Last Year: Never true  Transportation Needs: No Transportation Needs  . Lack of Transportation (Medical): No  . Lack of Transportation (Non-Medical): No  Physical Activity:   . Days of Exercise per Week: Not on file  . Minutes of Exercise per  Session: Not on file  Stress: Stress Concern Present  . Feeling of Stress : Very much  Social Connections:   . Frequency of Communication with Friends and Family: Not on file  . Frequency of Social Gatherings with Friends and Family: Not on file  . Attends Religious Services: Not on file  . Active Member of Clubs or Organizations: Not on file  . Attends Archivist Meetings: Not on file  . Marital Status: Not on file  Intimate Partner Violence:   . Fear of Current or Ex-Partner: Not on file  . Emotionally Abused: Not on file  . Physically Abused: Not on file  . Sexually Abused: Not on file    Family History  Problem Relation Age of Onset  . Cancer Sister   . Cancer - Cervical Mother   . Heart attack Brother   . Heart attack Maternal Grandfather      Current Outpatient Medications:  .  clonazePAM (KLONOPIN)  1 MG tablet, Take 1 tablet (1 mg total) by mouth 2 (two) times daily as needed for anxiety., Disp: 60 tablet, Rfl: 2 .  COMBIVENT RESPIMAT 20-100 MCG/ACT AERS respimat, INHALE 1 PUFF BY MOUTH INTO THE LUNGS EVERY 6 HOURS AS NEEDED, Disp: 4 g, Rfl: 2 .  ipratropium-albuterol (DUONEB) 0.5-2.5 (3) MG/3ML SOLN, Take 3 mLs by nebulization every 6 (six) hours as needed., Disp: 360 mL, Rfl: 11 .  lidocaine (XYLOCAINE) 2 % solution, Use as directed 15 mLs in the mouth or throat every 6 (six) hours as needed for mouth pain (for sore throat)., Disp: 100 mL, Rfl: 2 .  ondansetron (ZOFRAN) 4 MG tablet, Take 1 tablet (4 mg total) by mouth every 8 (eight) hours as needed for nausea or vomiting., Disp: 30 tablet, Rfl: 0 .  OXYGEN, Inhale 3 L into the lungs daily., Disp: , Rfl:  .  STIOLTO RESPIMAT 2.5-2.5 MCG/ACT AERS, Inhale 2 puffs into the lungs 2 (two) times daily as needed (shortness of breath). , Disp: , Rfl:  .  dexamethasone (DECADRON) 4 MG tablet, Take 2 tablets by mouth once a day on the day after chemotherapy and then take 2 tablets two times a day for 2 days. Take with  food., Disp: 30 tablet, Rfl: 1 .  fluticasone (FLONASE) 50 MCG/ACT nasal spray, 1 spray into each nostril twice a day (Patient not taking: Reported on 03/08/2019), Disp: 16 g, Rfl: 2 .  ondansetron (ZOFRAN) 8 MG tablet, Take 1 tablet (8 mg total) by mouth 2 (two) times daily as needed. Start on the third day after chemotherapy., Disp: 30 tablet, Rfl: 1 .  Oxycodone HCl 10 MG TABS, Take 1 tablet (10 mg total) by mouth every 4 (four) hours as needed., Disp: 60 tablet, Rfl: 0 .  prochlorperazine (COMPAZINE) 10 MG tablet, Take 1 tablet (10 mg total) by mouth every 6 (six) hours as needed (Nausea or vomiting)., Disp: 30 tablet, Rfl: 1  Physical exam:  Vitals:   03/09/19 1037  BP: 118/69  Pulse: 92  Temp: (!) 96.2 F (35.7 C)  TempSrc: Tympanic  SpO2: 92%  Weight: 105 lb 9.6 oz (47.9 kg)  Height: '5\' 6"'  (1.676 m)   Physical Exam Constitutional:      Comments: On home O2  HENT:     Head: Normocephalic and atraumatic.  Eyes:     Pupils: Pupils are equal, round, and reactive to light.  Cardiovascular:     Rate and Rhythm: Normal rate and regular rhythm.     Heart sounds: Normal heart sounds.  Pulmonary:     Effort: Pulmonary effort is normal.     Breath sounds: Normal breath sounds.  Abdominal:     General: Bowel sounds are normal.     Palpations: Abdomen is soft.  Musculoskeletal:     Cervical back: Normal range of motion.  Skin:    General: Skin is warm and dry.  Neurological:     Mental Status: He is alert and oriented to person, place, and time.      CMP Latest Ref Rng & Units 01/28/2019  Glucose 65 - 99 mg/dL -  BUN 8 - 27 mg/dL -  Creatinine 0.61 - 1.24 mg/dL 0.50(L)  Sodium 134 - 144 mmol/L -  Potassium 3.5 - 5.2 mmol/L -  Chloride 96 - 106 mmol/L -  CO2 20 - 29 mmol/L -  Calcium 8.6 - 10.2 mg/dL -  Total Protein 6.0 - 8.5 g/dL -  Total Bilirubin 0.0 -  1.2 mg/dL -  Alkaline Phos 39 - 117 IU/L -  AST 0 - 40 IU/L -  ALT 0 - 44 IU/L -   CBC Latest Ref Rng & Units  10/24/2018  WBC 3.4 - 10.8 x10E3/uL 6.4  Hemoglobin 13.0 - 17.7 g/dL 13.5  Hematocrit 37.5 - 51.0 % 41.0  Platelets 150 - 450 x10E3/uL 364    Assessment and plan- Patient is a 69 y.o. male with squamous cell carcinoma of the supraglottic larynx stage IVA cT4a cN1 cM0 here to discuss non surgical management.  Patient has opted against total laryngectomy. 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 He understands he has locally advanced disease and chemo/RT may not be curative but there is a chance we can control it. He will be seeing rad onc later today.   I recommend weekly chemotherapy with cisplatin at 40 mg/meter square concurrent with RT.  I explained to the patient the risks and benefits of chemotherapy including all but not limited to nausea, vomiting, low blood counts and risk of infection and hospitalization. Risk of hearing loss, kidney injury and peripheral neuropathy associated with cisplatin. Patient understands and agrees to proceed as planned.  We discussed PEG tube. He wishes to avoid it if possible. If he cannot keep up with PO intake he will consider it down the line. Will refer him to nutrition cunselling.   I will tentatively see him in 2 weeks to start 1st cycle of cisplatin  Neoplasm related pain- continue prn oxycodone  Cancer Staging Squamous cell carcinoma of larynx [facility]) Staging form: Larynx - Glottis, AJCC 8th Edition - Clinical stage from 03/09/2019: Stage IVA (cT4a, cN1, cM0) - Signed by Sindy Guadeloupe, MD on 03/11/2019    Visit Diagnosis 1. Squamous cell carcinoma of larynx (HCC)   2. Goals of care, counseling/discussion   3. Neoplasm related pain      Dr. Randa Evens, MD, MPH [facility] [phone] 03/11/2019 1:45 PM

## 2019-03-11 NOTE — Progress Notes (Signed)
START ON PATHWAY REGIMEN - Head and Neck     A cycle is every 7 days:     Cisplatin   **Always confirm dose/schedule in your pharmacy ordering system**  Patient Characteristics: Larynx, Stage III, IVA, IVB; Unresectable Disease Classification: Larynx Current Disease Status: No Distant Metastases and No Recurrent Disease AJCC T Category: T4a AJCC 8 Stage Grouping: IVA AJCC N Category: cN1 AJCC M Category: M0 Intent of Therapy: Curative Intent, Discussed with Patient

## 2019-03-12 ENCOUNTER — Telehealth: Payer: Self-pay | Admitting: *Deleted

## 2019-03-12 NOTE — Telephone Encounter (Signed)
Called pt to go over port insertion appt. It has been scheduled for Thursday of this week. [facility]. NPO after midnight and have driver to take you home. Any meds with sip of water only the am of insertion. [facility]. Will need covid test tom. Pt already has app[facility] at [facility] at 2 pm. I told him [facility] and they will perform test in car for covid. He is aware and has not it before. He is frustrated with all these appt. And then in listening to him he is really frustrated that he has told people over 3 years that something wrong in throat and he thought he had cancer and now he has it proved and thinks that it should have been found way before now. He will go to the appts he said

## 2019-03-13 ENCOUNTER — Other Ambulatory Visit
Admission: RE | Admit: 2019-03-13 | Discharge: 2019-03-13 | Disposition: A | Discharge status: Home / Self Care | Payer: Medicare Other | Source: Ambulatory Visit | Attending: Vascular Surgery | Admitting: Vascular Surgery

## 2019-03-13 ENCOUNTER — Other Ambulatory Visit: Payer: Self-pay

## 2019-03-13 ENCOUNTER — Other Ambulatory Visit: Payer: Medicare Other

## 2019-03-13 ENCOUNTER — Ambulatory Visit: Admission: RE | Admit: 2019-03-13 | Payer: Medicare Other | Source: Ambulatory Visit

## 2019-03-13 DIAGNOSIS — C329 Malignant neoplasm of larynx, unspecified: Secondary | ICD-10-CM | POA: Diagnosis not present

## 2019-03-13 DIAGNOSIS — C61 Malignant neoplasm of prostate: Secondary | ICD-10-CM | POA: Diagnosis not present

## 2019-03-13 DIAGNOSIS — Z20822 Contact with and (suspected) exposure to covid-19: Secondary | ICD-10-CM | POA: Insufficient documentation

## 2019-03-13 DIAGNOSIS — Z01812 Encounter for preprocedural laboratory examination: Secondary | ICD-10-CM | POA: Insufficient documentation

## 2019-03-13 DIAGNOSIS — F419 Anxiety disorder, unspecified: Secondary | ICD-10-CM | POA: Diagnosis not present

## 2019-03-13 DIAGNOSIS — J029 Acute pharyngitis, unspecified: Secondary | ICD-10-CM | POA: Diagnosis not present

## 2019-03-13 DIAGNOSIS — R634 Abnormal weight loss: Secondary | ICD-10-CM | POA: Diagnosis not present

## 2019-03-13 DIAGNOSIS — R1310 Dysphagia, unspecified: Secondary | ICD-10-CM | POA: Diagnosis not present

## 2019-03-13 NOTE — Patient Instructions (Signed)

## 2019-03-14 ENCOUNTER — Inpatient Hospital Stay: Payer: Medicare Other | Attending: Oncology

## 2019-03-14 ENCOUNTER — Other Ambulatory Visit (INDEPENDENT_AMBULATORY_CARE_PROVIDER_SITE_OTHER): Payer: Self-pay | Admitting: Nurse Practitioner

## 2019-03-14 ENCOUNTER — Inpatient Hospital Stay: Payer: Medicare Other | Attending: Oncology | Admitting: Oncology

## 2019-03-14 ENCOUNTER — Other Ambulatory Visit: Payer: Self-pay

## 2019-03-14 DIAGNOSIS — C329 Malignant neoplasm of larynx, unspecified: Secondary | ICD-10-CM | POA: Diagnosis not present

## 2019-03-14 LAB — SARS CORONAVIRUS 2 (TAT 6-24 HRS): SARS Coronavirus 2: NEGATIVE

## 2019-03-14 MED ORDER — LIDOCAINE-PRILOCAINE 2.5-2.5 % EX CREA: TOPICAL_CREAM | CUTANEOUS | 3 refills | Status: DC

## 2019-03-14 NOTE — Progress Notes (Signed)
[place]  Telephone:([phone] [phone]  Patient Care Team: [name], NP as PCP - General (Nurse Practitioner) Greg Cutter, LCSW as Social Worker (Licensed Clinical Social Worker) De Hollingshead, [facility] as Pharmacist (Pharmacist) Vanita Ingles, RN as Case Manager (General Practice)   Name of the patient: Kevin Nelson  [id number]  20-Oct-1950   Date of visit: 03/15/19  Diagnosis-laryngeal cancer  Chief complaint/Reason for visit- Initial Meeting for [facility], preparing for starting chemotherapy  I connected with [name] on 03/15/19 at 11:00 AM EST by telephone visit and verified that I am speaking with the correct person using two identifiers.   I discussed the limitations, risks, security and privacy concerns of performing an evaluation and management service by telemedicine and the availability of in-person appointments. I also discussed with the patient that there may be a patient responsible charge related to this service. The patient expressed understanding and agreed to proceed.   Other persons participating in the visit and their role in the encounter: None   Patient's location: Home Provider's location: Office   Heme/Onc history:  Oncology History  Squamous cell carcinoma of larynx ([place])  01/04/2019 Initial Diagnosis   Squamous cell carcinoma of larynx ([place])   03/09/2019 Cancer Staging   Staging form: Larynx - Glottis, AJCC 8th Edition - Clinical stage from 03/09/2019: Stage IVA (cT4a, cN1, cM0) - Signed by Sindy Guadeloupe, MD on 03/11/2019   03/12/2019 -  Chemotherapy   The patient had palonosetron (ALOXI) injection 0.25 mg, 0.25 mg, Intravenous,  Once, 0 of 7 cycles CISplatin (PLATINOL) 60 mg in sodium chloride 0.9 % 250 mL chemo infusion, 40 mg/m2, Intravenous,  Once, 0 of 7 cycles fosaprepitant (EMEND) 150 mg in sodium chloride 0.9 % 145 mL IVPB, 150 mg, Intravenous,  Once,  0 of 7 cycles  for chemotherapy treatment.      Interval history-Mr. Capobianco is a 69 year old male who presents to chemo care clinic today for initial meeting in preparation for starting chemotherapy. I introduced the chemo care clinic and we discussed that the role of the clinic is to assist those who are at an increased risk of emergency room visits and/or complications during the course of chemotherapy treatment. We discussed that the increased risk takes into account factors such as age, performance status, and co-morbidities. We also discussed that for some, this might include barriers to care such as not having a primary care provider, lack of insurance/transportation, or not being able to afford medications. We discussed that the goal of the program is to help prevent unplanned ER visits and help reduce complications during chemotherapy. We do this by discussing specific risk factors to each individual and identifying ways that we can help improve these risk factors and reduce barriers to care.   ECOG FS:1 - Symptomatic but completely ambulatory  Review of systems- Review of Systems  Constitutional: Positive for malaise/fatigue. Negative for chills, fever and weight loss.  HENT: Positive for tinnitus. Negative for congestion and ear pain.   Eyes: Negative.  Negative for blurred vision and double vision.  Respiratory: Positive for shortness of breath. Negative for cough and sputum production.        O2  Cardiovascular: Negative.  Negative for chest pain, palpitations and leg swelling.  Gastrointestinal: Negative.  Negative for abdominal pain, constipation, diarrhea, nausea and vomiting.  Genitourinary: Negative for dysuria, frequency and urgency.  Musculoskeletal: Negative for back pain and falls.  Skin: Negative.  Negative for rash.  Neurological: Negative.  Negative for weakness and headaches.  Endo/Heme/Allergies: Negative.  Does not bruise/bleed easily.  Psychiatric/Behavioral: Negative.   Negative for depression. The patient is not nervous/anxious and does not have insomnia.     Current treatment- Cisplatin   No Known Allergies  Past Medical History:  Diagnosis Date  . Anxiety   . Basal cell carcinoma   . Cigarette smoker   . Dental decay   . Dysphagia   . Dyspnea   . End stage COPD ([place])    end stage copd, emphysema  . Erectile dysfunction   . Headache    every morning  . Hepatitis C    treated for 8 weeks this past year with Mayvret  . History of nonmelanoma skin cancer   . Hoarseness, chronic   . Myocardial infarction [facility]) march/april 2020   mild heart attack  . Prostate cancer ([place])   . Respiratory failure, acute ([place])    was inpt for 5 days. thought he was going to die.  . Sleep apnea   . Squamous cell skin cancer   . Throat cancer ([place])   . Use of leuprolide acetate (Lupron)    treated for prostate cancer.   . 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    Past Surgical History:  Procedure Laterality Date  . FACIAL LACERATION REPAIR    . HERNIA REPAIR Left 1992   inguinal  . MICROLARYNGOSCOPY N/A 01/17/2019   Procedure: MICROLARYNGOSCOPY WITH BX;  Surgeon: Clyde Canterbury, MD;  Location: [facility] ORS;  Service: ENT;  Laterality: N/A;  . PROSTATE BIOPSY    . SKIN CANCER EXCISION    . TRIGGER FINGER RELEASE Left 10/2018    Social History   Socioeconomic History  . Marital status: Significant Other    Spouse name: [name], girlfriend  . Number of children: Not on file  . Years of education: Not on file  . Highest education level: Not on file  Occupational History  . Occupation: Chief Strategy Officer    Comment: retired  Tobacco Use  . Smoking status: Current Every Day Smoker    Packs/day: 0.25    Years: 52.00    Pack years: 13.00    Types: Cigarettes  . Smokeless tobacco: Never Used  Substance and Sexual Activity  . Alcohol use: Yes    Alcohol/week: 21.0 standard drinks    Types: 21 Cans of beer per week    Comment: 3 a day   . Drug  use: Never  . Sexual activity: Yes  Other Topics Concern  . Not on file  Social History Narrative   Patient has retired d/t respiratory status. Currently lives with girlfriend    Social Determinants of Health   Financial Resource Strain: Low Risk   . Difficulty of Paying Living Expenses: Not hard at all  Food Insecurity: No Food Insecurity  . Worried About Charity fundraiser in the Last Year: Never true  . Ran Out of Food in the Last Year: Never true  Transportation Needs: No Transportation Needs  . Lack of Transportation (Medical): No  . Lack of Transportation (Non-Medical): No  Physical Activity:   . Days of Exercise per Week: Not on file  . Minutes of Exercise per Session: Not on file  Stress: Stress Concern Present  . Feeling of Stress : Very much  Social Connections:   . Frequency of Communication with Friends and Family: Not on file  . Frequency  of Social Gatherings with Friends and Family: Not on file  . Attends Religious Services: Not on file  . Active Member of Clubs or Organizations: Not on file  . Attends Archivist Meetings: Not on file  . Marital Status: Not on file  Intimate Partner Violence:   . Fear of Current or Ex-Partner: Not on file  . Emotionally Abused: Not on file  . Physically Abused: Not on file  . Sexually Abused: Not on file    Family History  Problem Relation Age of Onset  . Cancer Sister   . Cancer - Cervical Mother   . Heart attack Brother   . Heart attack Maternal Grandfather     No current outpatient medications on file.  Physical exam: There were no vitals filed for this visit. Limited due to virtual platform  CMP Latest Ref Rng & Units 01/28/2019  Glucose 65 - 99 mg/dL -  BUN 8 - 27 mg/dL -  Creatinine 0.61 - 1.24 mg/dL 0.50(L)  Sodium 134 - 144 mmol/L -  Potassium 3.5 - 5.2 mmol/L -  Chloride 96 - 106 mmol/L -  CO2 20 - 29 mmol/L -  Calcium 8.6 - 10.2 mg/dL -  Total Protein 6.0 - 8.5 g/dL -  Total Bilirubin 0.0 -  1.2 mg/dL -  Alkaline Phos 39 - 117 IU/L -  AST 0 - 40 IU/L -  ALT 0 - 44 IU/L -   CBC Latest Ref Rng & Units 10/24/2018  WBC 3.4 - 10.8 x10E3/uL 6.4  Hemoglobin 13.0 - 17.7 g/dL 13.5  Hematocrit 37.5 - 51.0 % 41.0  Platelets 150 - 450 x10E3/uL 364    No images are attached to the encounter.  No results found.   Assessment and plan- Patient is a 69 y.o. male who presents to [facility] for initial meeting in preparation for starting chemotherapy for the treatment of laryngeal cancer.   1. HPI: Patient initially presented to Dr. Richardson Landry with symptoms of difficulty swallowing persistent sore throat that have been going on for approximately 1 year.  Exam revealed a friable mass involving the false vocal cord but did not involve the true vocal cord.  Hypopharynx and tongue base were clear.  This was followed by a CT soft tissue neck which showed a 3.3 x 1.7 x 3.5 cm mass.  Findings were consistent with nodal metastatic disease.  Biopsy of the mass showed squamous cell carcinoma.  PET/CT showed hypermetabolic mass from left glottis to anterior commisure.  No distant metastasis.  Patient met with [facility] and has decided against total laryngectomy.  Plan is for concurrent chemo/radiation plus or minus chemo following.  2. Chemo Care Clinic/High Risk for ER/Hospitalization during chemotherapy- We discussed the role of the chemo care clinic and identified patient specific risk factors. I discussed that patient was identified as high risk primarily based on: Stage of disease and comorbidities.  Patient has past medical history positive for: Past Medical History:  Diagnosis Date  . Anxiety   . Basal cell carcinoma   . Cigarette smoker   . Dental decay   . Dysphagia   . Dyspnea   . End stage COPD ([place])    end stage copd, emphysema  . Erectile dysfunction   . Headache    every morning  . Hepatitis C    treated for 8 weeks this past year with Mayvret  . History of nonmelanoma skin  cancer   . Hoarseness, chronic   . Myocardial infarction (  [place]) march/april 2020   mild heart attack  . Prostate cancer ([place])   . Respiratory failure, acute ([place])    was inpt for 5 days. thought he was going to die.  . Sleep apnea   . Squamous cell skin cancer   . Throat cancer ([place])   . Use of leuprolide acetate (Lupron)    treated for prostate cancer.   . 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    Patient has past surgical history positive for: Past Surgical History:  Procedure Laterality Date  . FACIAL LACERATION REPAIR    . HERNIA REPAIR Left 1992   inguinal  . MICROLARYNGOSCOPY N/A 01/17/2019   Procedure: MICROLARYNGOSCOPY WITH BX;  Surgeon: Clyde Canterbury, MD;  Location: [facility] ORS;  Service: ENT;  Laterality: N/A;  . PROSTATE BIOPSY    . SKIN CANCER EXCISION    . TRIGGER FINGER RELEASE Left 10/2018    Based on our high risk symptom management report; this patient has a high risk of ED utilization.  The percentage below indicates how "at risk "  this patient based on the factors in this table within one year.   General Risk Score: 9  Values used to calculate this score:   Points  Metrics      1        Age: 52      [facility] Admissions: 6      2        ED Visits: 2      1        Has Chronic Obstructive Pulmonary Disease: Yes      0        Has Diabetes: No      0        Has Congestive Heart Failure: No      1        Has liver disease: Yes      0        Has Depression: No      0        Current PCP: [name], NP      1        Has Medicaid: Yes  3. We discussed that social determinants of health may have significant impacts on health and outcomes for cancer patients.  Today we discussed specific social determinants of performance status, alcohol use, depression, financial needs, food insecurity, housing, interpersonal violence, social connections, stress, tobacco use, and transportation.    After lengthy discussion the following were identified  as areas of need: None at this time.  Outpatient services: We discussed options including home based and outpatient services, DME and care program. We discusssed that patients who participate in regular physical activity report fewer negative impacts of cancer and treatments and report less fatigue.   Financial Concerns: We discussed that living with cancer can create tremendous financial burden.  We discussed options for assistance. I asked that if assistance is needed in affording medications or paying bills to please let us know so that we can provide assistance. We discussed options for food including social services. Steve's garden Designer, multimedia ($ 50 every 2 weeks) and our Air traffic controller.  We will also notify Barnabas Lister crater to see if cancer center can provide support.  Referral to Social work: Introduced Education officer, museum Elease Etienne and the services he can provide such as support with [employer], [employer] and gas vouchers.  Support groups: We discussed options for support groups at the cancer center. If interested, please notify nurse navigator to enroll. We discussed options for managing stress including healthy eating, exercise as well as participating in no charge counseling services at the cancer center and support groups.  If these are of interest, patient can notify either myself or primary nursing team.We discussed options for management including medications and referral to quit Smart program  Transportation: We discussed options for transportation including acta, paratransit, bus routes, link transit, taxi/uber/lyft, and cancer center [place].  I have notified primary oncology team who will help assist with arranging Lucianne Lei transportation for appointments when/if needed. We also discussed options for transportation on short notice/acute visits.  Palliative care services: We have palliative care services available in the cancer center to discuss goals of care and advanced care planning.   Palliative care services are also available to help manage chronic noncancer related illnesses such as CKD and CHF. Please let us know if you have any questions or would like to speak to our palliative nurse practitioner.  Symptom Management Clinic: We discussed our symptom management clinic which is available for acute concerns while receiving treatment such as nausea, vomiting or diarrhea.  We can be reached via telephone at [phone] or through my chart.  We are available for virtual or in person visits on the same day from 830 to 4 PM Monday through Friday. He denies needing specific assistance at this time and He will be followed by Dr. Elroy Channel clinical team.   Plan: Discussed symptom management clinic. Discussed palliative care services. Discussed resources that are available here at the cancer center. Discussed medications and new prescriptions to begin treatment such as anti-nausea or steroids.   Disposition: RTC on 03/26/2019 for cycle 1 cisplatin.  Visit Diagnosis 1. Laryngeal cancer [facility])     Patient expressed understanding and was in agreement with this plan. He also understands that He can call clinic at any time with any questions, concerns, or complaints.   I provided 15 minutes of non face-to-face telephone visit time during this encounter, and > 50% was spent counseling as documented under my assessment & plan.   [place] at [place]  CC: Dr. Janese Banks

## 2019-03-15 ENCOUNTER — Ambulatory Visit
Admission: RE | Admit: 2019-03-15 | Discharge: 2019-03-15 | Disposition: A | Discharge status: Home / Self Care | Payer: Medicare Other | Attending: Vascular Surgery | Admitting: Vascular Surgery

## 2019-03-15 ENCOUNTER — Encounter: Payer: Self-pay | Admitting: Vascular Surgery

## 2019-03-15 ENCOUNTER — Encounter
Admission: RE | Disposition: A | Discharge status: Home / Self Care | Payer: Self-pay | Source: Home / Self Care | Attending: Vascular Surgery

## 2019-03-15 DIAGNOSIS — Z8619 Personal history of other infectious and parasitic diseases: Secondary | ICD-10-CM | POA: Insufficient documentation

## 2019-03-15 DIAGNOSIS — Z85820 Personal history of malignant melanoma of skin: Secondary | ICD-10-CM | POA: Diagnosis not present

## 2019-03-15 DIAGNOSIS — C329 Malignant neoplasm of larynx, unspecified: Secondary | ICD-10-CM | POA: Diagnosis not present

## 2019-03-15 DIAGNOSIS — Z79899 Other long term (current) drug therapy: Secondary | ICD-10-CM | POA: Insufficient documentation

## 2019-03-15 DIAGNOSIS — Z809 Family history of malignant neoplasm, unspecified: Secondary | ICD-10-CM | POA: Insufficient documentation

## 2019-03-15 DIAGNOSIS — F17210 Nicotine dependence, cigarettes, uncomplicated: Secondary | ICD-10-CM | POA: Insufficient documentation

## 2019-03-15 DIAGNOSIS — G4730 Sleep apnea, unspecified: Secondary | ICD-10-CM | POA: Diagnosis not present

## 2019-03-15 DIAGNOSIS — Z8546 Personal history of malignant neoplasm of prostate: Secondary | ICD-10-CM | POA: Diagnosis not present

## 2019-03-15 DIAGNOSIS — I252 Old myocardial infarction: Secondary | ICD-10-CM | POA: Insufficient documentation

## 2019-03-15 DIAGNOSIS — Z8249 Family history of ischemic heart disease and other diseases of the circulatory system: Secondary | ICD-10-CM | POA: Insufficient documentation

## 2019-03-15 DIAGNOSIS — Z9981 Dependence on supplemental oxygen: Secondary | ICD-10-CM | POA: Diagnosis not present

## 2019-03-15 DIAGNOSIS — J439 Emphysema, unspecified: Secondary | ICD-10-CM | POA: Diagnosis not present

## 2019-03-15 DIAGNOSIS — G893 Neoplasm related pain (acute) (chronic): Secondary | ICD-10-CM | POA: Insufficient documentation

## 2019-03-15 DIAGNOSIS — Z8049 Family history of malignant neoplasm of other genital organs: Secondary | ICD-10-CM | POA: Insufficient documentation

## 2019-03-15 HISTORY — PX: PORTA CATH INSERTION: CATH118285

## 2019-03-15 SURGERY — PORTA CATH INSERTION
Anesthesia: Moderate Sedation

## 2019-03-15 MED ORDER — MIDAZOLAM HCL 5 MG/5ML IJ SOLN
INTRAMUSCULAR | Status: AC
  Filled 2019-03-15: qty 5

## 2019-03-15 MED ORDER — FENTANYL CITRATE (PF) 100 MCG/2ML IJ SOLN
INTRAMUSCULAR | Status: DC | PRN
  Administered 2019-03-15: 50 ug via INTRAVENOUS

## 2019-03-15 MED ORDER — MIDAZOLAM HCL 2 MG/2ML IJ SOLN
INTRAMUSCULAR | Status: DC | PRN
  Administered 2019-03-15: 2 mg via INTRAVENOUS

## 2019-03-15 MED ORDER — FENTANYL CITRATE (PF) 100 MCG/2ML IJ SOLN
INTRAMUSCULAR | Status: AC
  Filled 2019-03-15: qty 2

## 2019-03-15 MED ORDER — FAMOTIDINE 20 MG PO TABS: 40.0000 mg | ORAL_TABLET | Freq: Once | ORAL | Status: DC | PRN

## 2019-03-15 MED ORDER — CEFAZOLIN SODIUM-DEXTROSE 2-4 GM/100ML-% IV SOLN
INTRAVENOUS | Status: AC
  Administered 2019-03-15: 2 g via INTRAVENOUS
  Filled 2019-03-15: qty 100

## 2019-03-15 MED ORDER — MIDAZOLAM HCL 2 MG/ML PO SYRP
ORAL_SOLUTION | ORAL | Status: AC
  Filled 2019-03-15: qty 4

## 2019-03-15 MED ORDER — SODIUM CHLORIDE 0.9 % IV SOLN
INTRAVENOUS | Status: DC
  Administered 2019-03-15: 1000 mL via INTRAVENOUS

## 2019-03-15 MED ORDER — DIPHENHYDRAMINE HCL 50 MG/ML IJ SOLN: 50.0000 mg | Freq: Once | INTRAMUSCULAR | Status: DC | PRN

## 2019-03-15 MED ORDER — ONDANSETRON HCL 4 MG/2ML IJ SOLN: 4.0000 mg | Freq: Four times a day (QID) | INTRAMUSCULAR | Status: DC | PRN

## 2019-03-15 MED ORDER — HYDROMORPHONE HCL 1 MG/ML IJ SOLN: 1.0000 mg | Freq: Once | INTRAMUSCULAR | Status: DC | PRN

## 2019-03-15 MED ORDER — MIDAZOLAM HCL 2 MG/ML PO SYRP
8.0000 mg | ORAL_SOLUTION | Freq: Once | ORAL | Status: AC | PRN
  Administered 2019-03-15: 8 mg via ORAL

## 2019-03-15 MED ORDER — SODIUM CHLORIDE 0.9 % IV SOLN
Freq: Once | INTRAVENOUS | Status: DC
  Filled 2019-03-15: qty 2

## 2019-03-15 MED ORDER — CEFAZOLIN SODIUM-DEXTROSE 2-4 GM/100ML-% IV SOLN: 2.0000 g | Freq: Once | INTRAVENOUS | Status: AC

## 2019-03-15 MED ORDER — METHYLPREDNISOLONE SODIUM SUCC 125 MG IJ SOLR: 125.0000 mg | Freq: Once | INTRAMUSCULAR | Status: DC | PRN

## 2019-03-15 SURGICAL SUPPLY — 11 items
DERMABOND ADVANCED (GAUZE/BANDAGES/DRESSINGS) ×2
DERMABOND ADVANCED .7 DNX12 (GAUZE/BANDAGES/DRESSINGS) ×1 IMPLANT
ELECT REM PT RETURN 9FT ADLT (ELECTROSURGICAL) ×3
ELECTRODE REM PT RTRN 9FT ADLT (ELECTROSURGICAL) ×1 IMPLANT
KIT PORT POWER 8FR ISP CVUE (Port) ×3 IMPLANT
PACK ANGIOGRAPHY (CUSTOM PROCEDURE TRAY) ×3 IMPLANT
PENCIL ELECTRO HAND CTR (MISCELLANEOUS) ×3 IMPLANT
SUT MNCRL AB 4-0 PS2 18 (SUTURE) ×3 IMPLANT
SUT PROLENE 0 CT 1 30 (SUTURE) ×3 IMPLANT
SUT VIC AB 3-0 SH 27 (SUTURE) ×2
SUT VIC AB 3-0 SH 27X BRD (SUTURE) ×1 IMPLANT

## 2019-03-15 NOTE — Op Note (Signed)
      [facility]       Operative Note  Date: 03/15/2019  Preoperative diagnosis:  1. Laryngeal cancer  Postoperative diagnosis:  Same as above  Procedures: #1. Ultrasound guidance for vascular access to the right internal jugular vein. #2. Fluoroscopic guidance for placement of catheter. #3. Placement of CT compatible Port-A-Cath, right internal jugular vein.  Surgeon: Leotis Pain, MD.   Anesthesia: Local with moderate conscious sedation for approximately 15 minutes using 2 mg of Versed and 50 mcg of Fentanyl  Fluoroscopy time: less than 1 minute  Contrast used: 0  Estimated blood loss: 5 cc  Indication for the procedure:  The patient is a 69 y.o.male with laryngeal cancer.  The patient needs a Port-A-Cath for durable venous access, chemotherapy, lab draws, and CT scans. We are asked to place this. Risks and benefits were discussed and informed consent was obtained.  Description of procedure: The patient was brought to the vascular and interventional radiology suite.  Moderate conscious sedation was administered throughout the procedure during a face to face encounter with the patient with my supervision of the RN administering medicines and monitoring the patient's vital signs, pulse oximetry, telemetry and mental status throughout from the start of the procedure until the patient was taken to the recovery room. The right neck chest and shoulder were sterilely prepped and draped, and a sterile surgical field was created. Ultrasound was used to help visualize a patent right internal jugular vein. This was then accessed under direct ultrasound guidance without difficulty with the Seldinger needle and a permanent image was recorded. A J-wire was placed. After skin nick and dilatation, the peel-away sheath was then placed over the wire. I then anesthetized an area under the clavicle approximately 1-2 fingerbreadths. A transverse incision was created and an inferior pocket  was created with electrocautery and blunt dissection. The port was then brought onto the field, placed into the pocket and secured to the chest wall with 2 Prolene sutures. The catheter was connected to the port and tunneled from the subclavicular incision to the access site. Fluoroscopic guidance was then used to cut the catheter to an appropriate length. The catheter was then placed through the peel-away sheath and the peel-away sheath was removed. The catheter tip was parked in excellent location under fluorocoscopic guidance in the SVC just above the right atrium. The pocket was then irrigated with antibiotic impregnated saline and the wound was closed with a running 3-0 Vicryl and a 4-0 Monocryl. The access incision was closed with a single 4-0 Monocryl. The Huber needle was used to withdraw blood and flush the port with heparinized saline. Dermabond was then placed as a dressing. The patient tolerated the procedure well and was taken to the recovery room in stable condition.   Leotis Pain 03/15/2019 12:36 PM   This note was created with Dragon Medical transcription system. Any errors in dictation are purely unintentional.

## 2019-03-15 NOTE — Progress Notes (Signed)
Pt. Medicated with Versed 8 mg p.o. now for agitation pre-procedure. States "why haven't I gone in yet? I shouldn't have had to come in so early." explained to pt. He will follow pt. Currently in lab now and reassured he would be next.

## 2019-03-15 NOTE — Progress Notes (Signed)
Dr. Lucky Cowboy out at bedside to speak with pt. Re: port insertion. No need for follow-up per MD. Pt. Verbalized understanding with discussion.

## 2019-03-15 NOTE — H&P (Signed)
[facility] History & Physical Update  The patient was interviewed and re-examined.  The patient's previous History and Physical has been reviewed and is unchanged.  There is no change in the plan of care. We plan to proceed with the scheduled procedure.  Leotis Pain, MD  03/15/2019, 9:06 AM

## 2019-03-16 ENCOUNTER — Encounter: Payer: Self-pay | Admitting: Cardiology

## 2019-03-19 DIAGNOSIS — C329 Malignant neoplasm of larynx, unspecified: Secondary | ICD-10-CM | POA: Diagnosis not present

## 2019-03-21 ENCOUNTER — Other Ambulatory Visit: Payer: Self-pay | Admitting: Pulmonary Disease

## 2019-03-22 ENCOUNTER — Ambulatory Visit: Payer: Medicare Other

## 2019-03-22 ENCOUNTER — Other Ambulatory Visit: Payer: Self-pay

## 2019-03-22 ENCOUNTER — Ambulatory Visit
Admission: RE | Admit: 2019-03-22 | Discharge: 2019-03-22 | Disposition: A | Discharge status: Home / Self Care | Payer: Medicare Other | Source: Ambulatory Visit | Attending: Radiation Oncology | Admitting: Radiation Oncology

## 2019-03-22 DIAGNOSIS — F17210 Nicotine dependence, cigarettes, uncomplicated: Secondary | ICD-10-CM | POA: Diagnosis not present

## 2019-03-22 DIAGNOSIS — J449 Chronic obstructive pulmonary disease, unspecified: Secondary | ICD-10-CM | POA: Diagnosis not present

## 2019-03-22 DIAGNOSIS — C329 Malignant neoplasm of larynx, unspecified: Secondary | ICD-10-CM | POA: Diagnosis not present

## 2019-03-22 DIAGNOSIS — Z79899 Other long term (current) drug therapy: Secondary | ICD-10-CM | POA: Insufficient documentation

## 2019-03-22 DIAGNOSIS — Z85820 Personal history of malignant melanoma of skin: Secondary | ICD-10-CM | POA: Insufficient documentation

## 2019-03-22 DIAGNOSIS — Z85828 Personal history of other malignant neoplasm of skin: Secondary | ICD-10-CM | POA: Insufficient documentation

## 2019-03-22 DIAGNOSIS — G4730 Sleep apnea, unspecified: Secondary | ICD-10-CM | POA: Insufficient documentation

## 2019-03-22 DIAGNOSIS — F419 Anxiety disorder, unspecified: Secondary | ICD-10-CM | POA: Insufficient documentation

## 2019-03-22 DIAGNOSIS — R1310 Dysphagia, unspecified: Secondary | ICD-10-CM | POA: Insufficient documentation

## 2019-03-22 DIAGNOSIS — C61 Malignant neoplasm of prostate: Secondary | ICD-10-CM | POA: Insufficient documentation

## 2019-03-22 DIAGNOSIS — J029 Acute pharyngitis, unspecified: Secondary | ICD-10-CM | POA: Diagnosis not present

## 2019-03-22 DIAGNOSIS — R634 Abnormal weight loss: Secondary | ICD-10-CM | POA: Diagnosis not present

## 2019-03-23 ENCOUNTER — Other Ambulatory Visit: Payer: Self-pay

## 2019-03-23 ENCOUNTER — Ambulatory Visit
Admission: RE | Admit: 2019-03-23 | Discharge: 2019-03-23 | Disposition: A | Discharge status: Home / Self Care | Payer: Medicare Other | Source: Ambulatory Visit | Attending: Radiation Oncology | Admitting: Radiation Oncology

## 2019-03-23 NOTE — Addendum Note (Signed)
Addended by: Faythe Casa E on: 03/23/2019 12:50 PM   Modules accepted: Level of Service

## 2019-03-26 ENCOUNTER — Inpatient Hospital Stay (HOSPITAL_BASED_OUTPATIENT_CLINIC_OR_DEPARTMENT_OTHER): Payer: Medicare Other | Admitting: Oncology

## 2019-03-26 ENCOUNTER — Inpatient Hospital Stay: Payer: Medicare Other

## 2019-03-26 ENCOUNTER — Other Ambulatory Visit: Payer: Self-pay

## 2019-03-26 ENCOUNTER — Inpatient Hospital Stay: Payer: Medicare Other | Attending: Oncology

## 2019-03-26 ENCOUNTER — Ambulatory Visit
Admission: RE | Admit: 2019-03-26 | Discharge: 2019-03-26 | Disposition: A | Discharge status: Home / Self Care | Payer: Medicare Other | Source: Ambulatory Visit | Attending: Radiation Oncology | Admitting: Radiation Oncology

## 2019-03-26 ENCOUNTER — Encounter: Payer: Self-pay | Admitting: Oncology

## 2019-03-26 VITALS — BP 128/62 | HR 79 | Temp 98.2°F | Resp 18

## 2019-03-26 VITALS — BP 123/100 | HR 86 | Temp 96.6°F | Ht 66.0 in | Wt 107.5 lb

## 2019-03-26 DIAGNOSIS — Z95828 Presence of other vascular implants and grafts: Secondary | ICD-10-CM

## 2019-03-26 DIAGNOSIS — Z5111 Encounter for antineoplastic chemotherapy: Secondary | ICD-10-CM | POA: Insufficient documentation

## 2019-03-26 DIAGNOSIS — G893 Neoplasm related pain (acute) (chronic): Secondary | ICD-10-CM | POA: Diagnosis not present

## 2019-03-26 DIAGNOSIS — C329 Malignant neoplasm of larynx, unspecified: Secondary | ICD-10-CM

## 2019-03-26 DIAGNOSIS — F419 Anxiety disorder, unspecified: Secondary | ICD-10-CM | POA: Diagnosis not present

## 2019-03-26 DIAGNOSIS — J029 Acute pharyngitis, unspecified: Secondary | ICD-10-CM | POA: Diagnosis not present

## 2019-03-26 DIAGNOSIS — C321 Malignant neoplasm of supraglottis: Secondary | ICD-10-CM | POA: Diagnosis not present

## 2019-03-26 DIAGNOSIS — E440 Moderate protein-calorie malnutrition: Secondary | ICD-10-CM | POA: Insufficient documentation

## 2019-03-26 DIAGNOSIS — C61 Malignant neoplasm of prostate: Secondary | ICD-10-CM | POA: Diagnosis not present

## 2019-03-26 DIAGNOSIS — R634 Abnormal weight loss: Secondary | ICD-10-CM | POA: Diagnosis not present

## 2019-03-26 DIAGNOSIS — R1310 Dysphagia, unspecified: Secondary | ICD-10-CM | POA: Diagnosis not present

## 2019-03-26 LAB — BASIC METABOLIC PANEL
Anion gap: 8 (ref 5–15)
BUN: 10 mg/dL (ref 8–23)
CO2: 31 mmol/L (ref 22–32)
Calcium: 8.6 mg/dL — ABNORMAL LOW (ref 8.9–10.3)
Chloride: 98 mmol/L (ref 98–111)
Creatinine, Ser: 0.63 mg/dL (ref 0.61–1.24)
GFR calc Af Amer: >60 mL/min (ref >60)
GFR calc non Af Amer: >60 mL/min (ref >60)
Glucose, Bld: 131 mg/dL — ABNORMAL HIGH (ref 70–99)
Potassium: 4 mmol/L (ref 3.5–5.1)
Sodium: 137 mmol/L (ref 135–145)

## 2019-03-26 LAB — CBC WITH DIFFERENTIAL/PLATELET
Abs Immature Granulocytes: 0.05 10*3/uL (ref 0.00–0.07)
Basophils Absolute: 0.1 10*3/uL (ref 0.0–0.1)
Basophils Relative: 1 %
Eosinophils Absolute: 0.3 10*3/uL (ref 0.0–0.5)
Eosinophils Relative: 5 %
HCT: 38.7 % — ABNORMAL LOW (ref 39.0–52.0)
Hemoglobin: 11.6 g/dL — ABNORMAL LOW (ref 13.0–17.0)
Immature Granulocytes: 1 %
Lymphocytes Relative: 23 %
Lymphs Abs: 1.3 10*3/uL (ref 0.7–4.0)
MCH: 26.9 pg (ref 26.0–34.0)
MCHC: 30 g/dL (ref 30.0–36.0)
MCV: 89.8 fL (ref 80.0–100.0)
Monocytes Absolute: 0.8 10*3/uL (ref 0.1–1.0)
Monocytes Relative: 14 %
Neutro Abs: 3.2 10*3/uL (ref 1.7–7.7)
Neutrophils Relative %: 56 %
Platelets: 233 10*3/uL (ref 150–400)
RBC: 4.31 MIL/uL (ref 4.22–5.81)
RDW: 14.6 % (ref 11.5–15.5)
WBC: 5.8 10*3/uL (ref 4.0–10.5)
nRBC: 0 % (ref 0.0–0.2)

## 2019-03-26 MED ORDER — PALONOSETRON HCL INJECTION 0.25 MG/5ML
0.2500 mg | Freq: Once | INTRAVENOUS | Status: AC
  Administered 2019-03-26: 0.25 mg via INTRAVENOUS
  Filled 2019-03-26: qty 5

## 2019-03-26 MED ORDER — OXYCODONE HCL 10 MG PO TABS: 10.0000 mg | ORAL_TABLET | ORAL | 0 refills | Status: DC | PRN

## 2019-03-26 MED ORDER — SODIUM CHLORIDE 0.9% FLUSH
10.0000 mL | Freq: Once | INTRAVENOUS | Status: AC
  Administered 2019-03-26: 10 mL via INTRAVENOUS
  Filled 2019-03-26: qty 10

## 2019-03-26 MED ORDER — SODIUM CHLORIDE 0.9 % IV SOLN
10.0000 mg | Freq: Once | INTRAVENOUS | Status: AC
  Administered 2019-03-26: 10 mg via INTRAVENOUS
  Filled 2019-03-26: qty 10

## 2019-03-26 MED ORDER — SODIUM CHLORIDE 0.9 % IV SOLN
Freq: Once | INTRAVENOUS | Status: AC
  Filled 2019-03-26: qty 250

## 2019-03-26 MED ORDER — HEPARIN SOD (PORK) LOCK FLUSH 100 UNIT/ML IV SOLN
500.0000 [IU] | Freq: Once | INTRAVENOUS | Status: AC | PRN
  Administered 2019-03-26: 500 [IU]
  Filled 2019-03-26: qty 5

## 2019-03-26 MED ORDER — SODIUM CHLORIDE 0.9 % IV SOLN
40.0000 mg/m2 | Freq: Once | INTRAVENOUS | Status: AC
  Administered 2019-03-26: 60 mg via INTRAVENOUS
  Filled 2019-03-26: qty 60

## 2019-03-26 MED ORDER — SODIUM CHLORIDE 0.9 % IV SOLN
150.0000 mg | Freq: Once | INTRAVENOUS | Status: AC
  Administered 2019-03-26: 150 mg via INTRAVENOUS
  Filled 2019-03-26: qty 150

## 2019-03-26 MED ORDER — POTASSIUM CHLORIDE 2 MEQ/ML IV SOLN
Freq: Once | INTRAVENOUS | Status: AC
  Filled 2019-03-26: qty 1000

## 2019-03-26 NOTE — Progress Notes (Signed)
B/P 123/100 and Calcium 8.6, okay to proceed with Cisplatin treatment per Dr. Janese Banks.   [id number]: Pt reports "I am loosing my voice". Pt states that this started approx an hour ago.Pt states no other changes, symptoms or concerns at this time. No s/s of distress noted. Pt reports that today he had his first radiation treatment. Dr. Janese Banks aware, NNO at this time, proceed with treatment as scheduled per Dr. Janese Banks. Continue to monitor pt.   1553: Pt tolerated infusion well. Pt denies any concerns at this time. No s/s of distress noted. Pt and VS stable at discharge.

## 2019-03-26 NOTE — Progress Notes (Signed)
Patient is here today to follow up on his larynx cancer.

## 2019-03-26 NOTE — Progress Notes (Signed)
BP 123/100 ok to proceed per MD

## 2019-03-26 NOTE — Progress Notes (Addendum)
Nutrition Assessment   Reason for Assessment:  Patient identified on Malnutrition Screening report.   Laryngeal cancer   ASSESSMENT: 69 year old male with squamous cell carcinoma of supraglottic larynx stage IV cancer.  Past medical history of tobacco dependence, COPD on home oxygen,  Hep C, MI, prostate cancer, dysphagia, dental decay. Patient decided against total laryngectomy including possiblity of feeding tube placement and starting concurrent chemotherapy and radiation.   Met with patient during infusion to introduce self and service at [facility]. Patient reports for the past 3 years has had a sore throat.  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 Reports that he has eaten but is quick to loose interest in eating.  Reports painful swallowing and stays away from steak, toast mostly.  Reports breakfast is usually french toast or grilled cheese or eggs and sausage. Reports lunch is lighter and supper is big meal.  Recently made vegetable beef soup.  Drinks shakes but does not remember the name of it.     Medications: zofran, decadron, compazine   Labs: glucose 131   Anthropometrics:   Height: 66 inches Weight: 107 lb today UBW: 140 lb about 3 years ago per patient BMI: 17  16% weight loss in the last 3 years per report  Noted 107 lb 01/13/2018 02/17/19 112 lb   Estimated Energy Needs  Kcals: 1500-1750 Protein: 75-88 g Fluid: > 1.5 L   NUTRITION DIAGNOSIS: Inadequate oral intake related to COPD, cancer as evidenced by weight loss.    INTERVENTION:  Discussed importance of nutrition during treatment.  Discussed strategies to increase calories and protein.  Handout provided.  Concerned about nutritional status prior to starting treatment.  Patient verbalized that he did not want a feeding tube that was discussed if he decided to pursue surgery.  Encouraged oral nutrition supplements.   Patient would benefit from evaluation from rehab.   Maureen informed.   Patient has contact information   MONITORING, EVALUATION, GOAL: patient will consume adequate calories and protein to prevent weight loss   Next Visit: March 22nd following radiation or if in infusion  Ishita Mcnerney B. Zenia Resides, [place], [place] Registered Dietitian [phone] (pager)

## 2019-03-26 NOTE — Progress Notes (Signed)
72ml UO - before pre fluids, ok to proceed per md

## 2019-03-27 ENCOUNTER — Telehealth: Payer: Self-pay

## 2019-03-27 ENCOUNTER — Other Ambulatory Visit: Payer: Self-pay

## 2019-03-27 ENCOUNTER — Ambulatory Visit
Admission: RE | Admit: 2019-03-27 | Discharge: 2019-03-27 | Disposition: A | Discharge status: Home / Self Care | Payer: Medicare Other | Source: Ambulatory Visit | Attending: Radiation Oncology | Admitting: Radiation Oncology

## 2019-03-27 DIAGNOSIS — C61 Malignant neoplasm of prostate: Secondary | ICD-10-CM | POA: Diagnosis not present

## 2019-03-27 DIAGNOSIS — C329 Malignant neoplasm of larynx, unspecified: Secondary | ICD-10-CM | POA: Diagnosis not present

## 2019-03-27 DIAGNOSIS — R1310 Dysphagia, unspecified: Secondary | ICD-10-CM | POA: Diagnosis not present

## 2019-03-27 DIAGNOSIS — J029 Acute pharyngitis, unspecified: Secondary | ICD-10-CM | POA: Diagnosis not present

## 2019-03-27 DIAGNOSIS — R634 Abnormal weight loss: Secondary | ICD-10-CM | POA: Diagnosis not present

## 2019-03-27 DIAGNOSIS — F419 Anxiety disorder, unspecified: Secondary | ICD-10-CM | POA: Diagnosis not present

## 2019-03-27 NOTE — Progress Notes (Signed)
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 Richardson Nelson with symptoms of difficulty swallowing and persistent sore throat which has been ongoing for the last 1 year.NPL exam showed afriable mass involving the false vocal cord but did not involve the true vocal cords. True vocal cords remain mobile on exam. Hypopharynx and tongue base was clear.   This was followed by CT soft tissue neck which showed a 3.3 x 1.7 x 3.5 cm mass which was thought to be involving both the true and false vocal cords and extending anteriorly to involve the anterior commissure. Mass extends to involve the paraglottic space and erodes through the thyroid cartilage anteriorly and left of midline. There is also invasion of the preepiglottic space inferiorly. Enlarged cystic necrotic level 3/4 lymph node measuring 1.6 x 1.2 cm. Findings are consistent with nodal metastatic disease. Biopsy of the mass showed squamous cell carcinoma.   PET/CT showed hyppermetabolic mass extending from left glottis to anterior commisure. Solitary left level 2 metastatic Lymph node. No distant metastatic disease  Patient met with [facility] and decided against total laryngectomy.  Plan is for concurrent chemo/RT +/- chemotherapy following chemo/RT   Interval history-patient still has pain during swallowing and some hoarseness of voice for which she takes oxycodone 10 mg every 4 hours as needed.  Feels that his pain is still not well controlled with that but would like to hold off on increasing his pain medicines just yet.  He is using MiraLAX for his constipation and has a bowel movement every 2 to 3 days.  ECOG PS- 1 Pain scale- 0  Review of systems- Review of Systems  Constitutional: Positive for malaise/fatigue. Negative for chills, fever and weight loss.  HENT: Negative for congestion, ear discharge and nosebleeds.        Throat pain  Eyes: Negative for blurred vision.  Respiratory: Negative for cough, hemoptysis, sputum production, shortness of breath and wheezing.   Cardiovascular: Negative for chest pain, palpitations, orthopnea and claudication.  Gastrointestinal: Negative for abdominal pain, blood in stool, constipation, diarrhea, heartburn, melena, nausea and vomiting.  Genitourinary: Negative for dysuria, flank pain, frequency, hematuria and urgency.  Musculoskeletal: Negative for back pain, joint pain and myalgias.  Skin: Negative for rash.  Neurological: Negative for dizziness, tingling, focal weakness, seizures, weakness and headaches.  Endo/Heme/Allergies: Does not bruise/bleed easily.  Psychiatric/Behavioral: Negative for depression and suicidal ideas. The patient does not have insomnia.       No Known Allergies   Past Medical History:  Diagnosis Date  . Anxiety   . Basal cell carcinoma   . Cigarette smoker   . Dental decay   . Dysphagia   . Dyspnea   . End stage COPD ([place])    end stage copd, emphysema  . Erectile dysfunction   . Headache  every morning  . Hepatitis C    treated for 8 weeks this past year with Mayvret  . History of nonmelanoma skin cancer   . Hoarseness, chronic   . Myocardial infarction [facility]) march/april 2020    mild heart attack  . Prostate cancer ([place])   . Respiratory failure, acute ([place])    was inpt for 5 days. thought he was going to die.  . Sleep apnea   . Squamous cell skin cancer   . Throat cancer ([place])   . Use of leuprolide acetate (Lupron)    treated for prostate cancer.   . 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     Past Surgical History:  Procedure Laterality Date  . FACIAL LACERATION REPAIR    . HERNIA REPAIR Left 1992   inguinal  . MICROLARYNGOSCOPY N/A 01/17/2019   Procedure: MICROLARYNGOSCOPY WITH BX;  Surgeon: Kevin Canterbury, MD;  Location: [facility] ORS;  Service: ENT;  Laterality: N/A;  . PORTA CATH INSERTION N/A 03/15/2019   Procedure: PORTA CATH INSERTION;  Surgeon: Kevin Huxley, MD;  Location: [place] CV LAB;  Service: Cardiovascular;  Laterality: N/A;  . PROSTATE BIOPSY    . SKIN CANCER EXCISION    . TRIGGER FINGER RELEASE Left 10/2018    Social History   Socioeconomic History  . Marital status: Significant Other    Spouse name: [name], girlfriend  . Number of children: Not on file  . Years of education: Not on file  . Highest education level: Not on file  Occupational History  . Occupation: Chief Strategy Officer    Comment: retired  Tobacco Use  . Smoking status: Current Every Day Smoker    Years: 52.00    Types: Cigarettes  . Smokeless tobacco: Never Used  . Tobacco comment: 5 cigarettes a day  Substance and Sexual Activity  . Alcohol use: Yes    Alcohol/week: 4.0 standard drinks    Types: 4 Cans of beer per week  . Drug use: Never  . Sexual activity: Yes  Other Topics Concern  . Not on file  Social History Narrative   Patient has retired d/t respiratory status. Currently lives with girlfriend    Social Determinants of Health   Financial Resource Strain: Low Risk   . Difficulty of Paying Living Expenses: Not hard at all  Food Insecurity: No Food Insecurity  . Worried About Charity fundraiser in the Last Year: Never true  . Ran Out of  Food in the Last Year: Never true  Transportation Needs: No Transportation Needs  . Lack of Transportation (Medical): No  . Lack of Transportation (Non-Medical): No  Physical Activity:   . Days of Exercise per Week: Not on file  . Minutes of Exercise per Session: Not on file  Stress: Stress Concern Present  . Feeling of Stress : Very much  Social Connections:   . Frequency of Communication with Friends and Family: Not on file  . Frequency of Social Gatherings with Friends and Family: Not on file  . Attends Religious Services: Not on file  . Active Member of Clubs or Organizations: Not on file  . Attends Archivist Meetings: Not on file  . Marital Status: Not on file  Intimate Partner Violence:   . Fear of Current or Ex-Partner: Not on file  . Emotionally Abused: Not on file  . Physically Abused: Not on file  . Sexually Abused: Not on file    Family History  Problem Relation  Age of Onset  . Cancer Sister   . Cancer - Cervical Mother   . Heart attack Brother   . Heart attack Maternal Grandfather      Current Outpatient Medications:  .  clonazePAM (KLONOPIN) 1 MG tablet, Take 1 tablet (1 mg total) by mouth 2 (two) times daily as needed for anxiety., Disp: 60 tablet, Rfl: 2 .  COMBIVENT RESPIMAT 20-100 MCG/ACT AERS respimat, INHALE 1 PUFF BY MOUTH INTO THE LUNGS EVERY 6 HOURS AS NEEDED, Disp: 4 g, Rfl: 2 .  dexamethasone (DECADRON) 4 MG tablet, Take 2 tablets by mouth once a day on the day after chemotherapy and then take 2 tablets two times a day for 2 days. Take with food., Disp: 30 tablet, Rfl: 1 .  fluticasone (FLONASE) 50 MCG/ACT nasal spray, 1 spray into each nostril twice a day, Disp: 16 g, Rfl: 2 .  ipratropium-albuterol (DUONEB) 0.5-2.5 (3) MG/3ML SOLN, Take 3 mLs by nebulization every 6 (six) hours as needed., Disp: 360 mL, Rfl: 11 .  lidocaine (XYLOCAINE) 2 % solution, Use as directed 15 mLs in the mouth or throat every 6 (six) hours as needed for mouth pain  (for sore throat)., Disp: 100 mL, Rfl: 2 .  lidocaine-prilocaine (EMLA) cream, Apply to affected area once, Disp: 30 g, Rfl: 3 .  Oxycodone HCl 10 MG TABS, Take 1 tablet (10 mg total) by mouth every 4 (four) hours as needed., Disp: 90 tablet, Rfl: 0 .  OXYGEN, Inhale 3 L into the lungs daily., Disp: , Rfl:  .  STIOLTO RESPIMAT 2.5-2.5 MCG/ACT AERS, Inhale 2 puffs into the lungs 2 (two) times daily as needed (shortness of breath). , Disp: , Rfl:  .  ondansetron (ZOFRAN) 4 MG tablet, Take 1 tablet (4 mg total) by mouth every 8 (eight) hours as needed for nausea or vomiting. (Patient not taking: Reported on 03/26/2019), Disp: 30 tablet, Rfl: 0 .  ondansetron (ZOFRAN) 8 MG tablet, Take 1 tablet (8 mg total) by mouth 2 (two) times daily as needed. Start on the third day after chemotherapy. (Patient not taking: Reported on 03/26/2019), Disp: 30 tablet, Rfl: 1 .  prochlorperazine (COMPAZINE) 10 MG tablet, Take 1 tablet (10 mg total) by mouth every 6 (six) hours as needed (Nausea or vomiting). (Patient not taking: Reported on 03/26/2019), Disp: 30 tablet, Rfl: 1  Physical exam:  Vitals:   03/26/19 0922  BP: (!) 123/100  Pulse: 86  Temp: (!) 96.6 F (35.9 C)  TempSrc: Tympanic  SpO2: 100%  Weight: 107 lb 8 oz (48.8 kg)  Height: '5\' 6"'  (1.676 m)   Physical Exam Constitutional:      Comments: Patient is on home oxygen  HENT:     Head: Normocephalic and atraumatic.  Cardiovascular:     Rate and Rhythm: Normal rate and regular rhythm.     Heart sounds: Normal heart sounds.  Pulmonary:     Effort: Pulmonary effort is normal.     Breath sounds: Normal breath sounds.  Abdominal:     General: Bowel sounds are normal.     Palpations: Abdomen is soft.  Musculoskeletal:     Cervical back: Normal range of motion.  Skin:    General: Skin is warm and dry.  Neurological:     Mental Status: He is alert and oriented to person, place, and time.      CMP Latest Ref Rng & Units 03/26/2019  Glucose 70 - 99  mg/dL 131(H)  BUN 8 - 23 mg/dL  10  Creatinine 0.61 - 1.24 mg/dL 0.63  Sodium 135 - 145 mmol/L 137  Potassium 3.5 - 5.1 mmol/L 4.0  Chloride 98 - 111 mmol/L 98  CO2 22 - 32 mmol/L 31  Calcium 8.9 - 10.3 mg/dL 8.6(L)  Total Protein 6.0 - 8.5 g/dL -  Total Bilirubin 0.0 - 1.2 mg/dL -  Alkaline Phos 39 - 117 IU/L -  AST 0 - 40 IU/L -  ALT 0 - 44 IU/L -   CBC Latest Ref Rng & Units 03/26/2019  WBC 4.0 - 10.5 K/uL 5.8  Hemoglobin 13.0 - 17.0 g/dL 11.6(L)  Hematocrit 39.0 - 52.0 % 38.7(L)  Platelets 150 - 400 K/uL 233    No images are attached to the encounter.  PERIPHERAL VASCULAR CATHETERIZATION  Result Date: 03/15/2019 See op note    Assessment and plan- Patient is a 69 y.o. male with squamous cell carcinoma of the supraglottic larynx stage IVA cT4a cN1 cM0.  He is here for on treatment assessment prior to cycle 1 of weekly cisplatin chemotherapy  Counts okay to proceed with cycle 1 of weekly cisplatin chemotherapy today.  I will see him back in 1 week's time for cycle 2 patient knows he needs to keep up with his fluid and nutritional intake during chemoradiation.  He is also following up with nutrition.  Neoplasm related pain: Continue 10 mg every 4 as needed oxycodone.  I have refilled his prescription today.  I will reassess his pain next week and if it still uncontrolled I will consider adding long-acting pain medicine.   Visit Diagnosis 1. Encounter for antineoplastic chemotherapy   2. Squamous cell carcinoma of larynx (HCC)   3. Neoplasm related pain      Dr. Randa Evens, MD, MPH [facility] at [facility] [phone] 03/27/2019 8:17 AM

## 2019-03-27 NOTE — Telephone Encounter (Signed)
Telephone call to pt for follow up after receiving first infusion yesterday.  Pt states infusion went well and no complaints today.  States is eating chicken soup right now.  States drinking plenty of fluids.  Encouraged pt to call for any questions or concerns.

## 2019-03-28 ENCOUNTER — Ambulatory Visit
Admission: RE | Admit: 2019-03-28 | Discharge: 2019-03-28 | Disposition: A | Discharge status: Home / Self Care | Payer: Medicare Other | Source: Ambulatory Visit | Attending: Radiation Oncology | Admitting: Radiation Oncology

## 2019-03-28 DIAGNOSIS — R1310 Dysphagia, unspecified: Secondary | ICD-10-CM | POA: Diagnosis not present

## 2019-03-28 DIAGNOSIS — C61 Malignant neoplasm of prostate: Secondary | ICD-10-CM | POA: Diagnosis not present

## 2019-03-28 DIAGNOSIS — R634 Abnormal weight loss: Secondary | ICD-10-CM | POA: Diagnosis not present

## 2019-03-28 DIAGNOSIS — J029 Acute pharyngitis, unspecified: Secondary | ICD-10-CM | POA: Diagnosis not present

## 2019-03-28 DIAGNOSIS — C329 Malignant neoplasm of larynx, unspecified: Secondary | ICD-10-CM | POA: Diagnosis not present

## 2019-03-28 DIAGNOSIS — F419 Anxiety disorder, unspecified: Secondary | ICD-10-CM | POA: Diagnosis not present

## 2019-03-29 ENCOUNTER — Ambulatory Visit
Admission: RE | Admit: 2019-03-29 | Discharge: 2019-03-29 | Disposition: A | Discharge status: Home / Self Care | Payer: Medicare Other | Source: Ambulatory Visit | Attending: Radiation Oncology | Admitting: Radiation Oncology

## 2019-03-29 DIAGNOSIS — J029 Acute pharyngitis, unspecified: Secondary | ICD-10-CM | POA: Diagnosis not present

## 2019-03-29 DIAGNOSIS — C329 Malignant neoplasm of larynx, unspecified: Secondary | ICD-10-CM | POA: Diagnosis not present

## 2019-03-29 DIAGNOSIS — R634 Abnormal weight loss: Secondary | ICD-10-CM | POA: Diagnosis not present

## 2019-03-29 DIAGNOSIS — R1310 Dysphagia, unspecified: Secondary | ICD-10-CM | POA: Diagnosis not present

## 2019-03-29 DIAGNOSIS — C61 Malignant neoplasm of prostate: Secondary | ICD-10-CM | POA: Diagnosis not present

## 2019-03-29 DIAGNOSIS — F419 Anxiety disorder, unspecified: Secondary | ICD-10-CM | POA: Diagnosis not present

## 2019-03-30 ENCOUNTER — Ambulatory Visit
Admission: RE | Admit: 2019-03-30 | Discharge: 2019-03-30 | Disposition: A | Discharge status: Home / Self Care | Payer: Medicare Other | Source: Ambulatory Visit | Attending: Radiation Oncology | Admitting: Radiation Oncology

## 2019-03-30 DIAGNOSIS — C329 Malignant neoplasm of larynx, unspecified: Secondary | ICD-10-CM | POA: Diagnosis not present

## 2019-03-30 DIAGNOSIS — R634 Abnormal weight loss: Secondary | ICD-10-CM | POA: Diagnosis not present

## 2019-03-30 DIAGNOSIS — J029 Acute pharyngitis, unspecified: Secondary | ICD-10-CM | POA: Diagnosis not present

## 2019-03-30 DIAGNOSIS — F419 Anxiety disorder, unspecified: Secondary | ICD-10-CM | POA: Diagnosis not present

## 2019-03-30 DIAGNOSIS — C61 Malignant neoplasm of prostate: Secondary | ICD-10-CM | POA: Diagnosis not present

## 2019-03-30 DIAGNOSIS — R1310 Dysphagia, unspecified: Secondary | ICD-10-CM | POA: Diagnosis not present

## 2019-04-02 ENCOUNTER — Ambulatory Visit
Admission: RE | Admit: 2019-04-02 | Discharge: 2019-04-02 | Disposition: A | Discharge status: Home / Self Care | Payer: Medicare Other | Source: Ambulatory Visit | Attending: Radiation Oncology | Admitting: Radiation Oncology

## 2019-04-02 ENCOUNTER — Other Ambulatory Visit: Payer: Self-pay

## 2019-04-02 ENCOUNTER — Inpatient Hospital Stay: Payer: Medicare Other

## 2019-04-02 ENCOUNTER — Encounter: Payer: Self-pay | Admitting: Oncology

## 2019-04-02 ENCOUNTER — Inpatient Hospital Stay (HOSPITAL_BASED_OUTPATIENT_CLINIC_OR_DEPARTMENT_OTHER): Payer: Medicare Other | Admitting: Oncology

## 2019-04-02 VITALS — BP 132/70 | HR 79 | Temp 96.5°F | Resp 16 | Wt 109.0 lb

## 2019-04-02 DIAGNOSIS — C329 Malignant neoplasm of larynx, unspecified: Secondary | ICD-10-CM | POA: Diagnosis not present

## 2019-04-02 DIAGNOSIS — C321 Malignant neoplasm of supraglottis: Secondary | ICD-10-CM | POA: Diagnosis not present

## 2019-04-02 DIAGNOSIS — R1310 Dysphagia, unspecified: Secondary | ICD-10-CM | POA: Diagnosis not present

## 2019-04-02 DIAGNOSIS — G893 Neoplasm related pain (acute) (chronic): Secondary | ICD-10-CM | POA: Diagnosis not present

## 2019-04-02 DIAGNOSIS — D6481 Anemia due to antineoplastic chemotherapy: Secondary | ICD-10-CM | POA: Diagnosis not present

## 2019-04-02 DIAGNOSIS — T451X5A Adverse effect of antineoplastic and immunosuppressive drugs, initial encounter: Secondary | ICD-10-CM

## 2019-04-02 DIAGNOSIS — J029 Acute pharyngitis, unspecified: Secondary | ICD-10-CM | POA: Diagnosis not present

## 2019-04-02 DIAGNOSIS — Z5111 Encounter for antineoplastic chemotherapy: Secondary | ICD-10-CM | POA: Diagnosis not present

## 2019-04-02 DIAGNOSIS — R634 Abnormal weight loss: Secondary | ICD-10-CM | POA: Diagnosis not present

## 2019-04-02 DIAGNOSIS — E440 Moderate protein-calorie malnutrition: Secondary | ICD-10-CM | POA: Diagnosis not present

## 2019-04-02 DIAGNOSIS — C61 Malignant neoplasm of prostate: Secondary | ICD-10-CM | POA: Diagnosis not present

## 2019-04-02 DIAGNOSIS — F419 Anxiety disorder, unspecified: Secondary | ICD-10-CM | POA: Diagnosis not present

## 2019-04-02 LAB — CBC WITH DIFFERENTIAL/PLATELET
Abs Immature Granulocytes: 0.03 10*3/uL (ref 0.00–0.07)
Basophils Absolute: 0 10*3/uL (ref 0.0–0.1)
Basophils Relative: 0 %
Eosinophils Absolute: 0.3 10*3/uL (ref 0.0–0.5)
Eosinophils Relative: 5 %
HCT: 32.6 % — ABNORMAL LOW (ref 39.0–52.0)
Hemoglobin: 10.3 g/dL — ABNORMAL LOW (ref 13.0–17.0)
Immature Granulocytes: 0 %
Lymphocytes Relative: 12 %
Lymphs Abs: 0.9 10*3/uL (ref 0.7–4.0)
MCH: 27.8 pg (ref 26.0–34.0)
MCHC: 31.6 g/dL (ref 30.0–36.0)
MCV: 87.9 fL (ref 80.0–100.0)
Monocytes Absolute: 1.1 10*3/uL — ABNORMAL HIGH (ref 0.1–1.0)
Monocytes Relative: 15 %
Neutro Abs: 5 10*3/uL (ref 1.7–7.7)
Neutrophils Relative %: 68 %
Platelets: 181 10*3/uL (ref 150–400)
RBC: 3.71 MIL/uL — ABNORMAL LOW (ref 4.22–5.81)
RDW: 14.8 % (ref 11.5–15.5)
WBC: 7.3 10*3/uL (ref 4.0–10.5)
nRBC: 0 % (ref 0.0–0.2)

## 2019-04-02 LAB — BASIC METABOLIC PANEL
Anion gap: 5 (ref 5–15)
BUN: 13 mg/dL (ref 8–23)
CO2: 31 mmol/L (ref 22–32)
Calcium: 7.8 mg/dL — ABNORMAL LOW (ref 8.9–10.3)
Chloride: 97 mmol/L — ABNORMAL LOW (ref 98–111)
Creatinine, Ser: 0.56 mg/dL — ABNORMAL LOW (ref 0.61–1.24)
GFR calc Af Amer: >60 mL/min (ref >60)
GFR calc non Af Amer: >60 mL/min (ref >60)
Glucose, Bld: 121 mg/dL — ABNORMAL HIGH (ref 70–99)
Potassium: 4 mmol/L (ref 3.5–5.1)
Sodium: 133 mmol/L — ABNORMAL LOW (ref 135–145)

## 2019-04-02 MED ORDER — SODIUM CHLORIDE 0.9 % IV SOLN
Freq: Once | INTRAVENOUS | Status: AC
  Filled 2019-04-02: qty 250

## 2019-04-02 MED ORDER — PANTOPRAZOLE SODIUM 20 MG PO TBEC: 20.0000 mg | DELAYED_RELEASE_TABLET | Freq: Every day | ORAL | 2 refills | Status: DC

## 2019-04-02 MED ORDER — HEPARIN SOD (PORK) LOCK FLUSH 100 UNIT/ML IV SOLN
INTRAVENOUS | Status: AC
  Filled 2019-04-02: qty 5

## 2019-04-02 MED ORDER — POTASSIUM CHLORIDE 2 MEQ/ML IV SOLN
Freq: Once | INTRAVENOUS | Status: AC
  Filled 2019-04-02: qty 1000

## 2019-04-02 MED ORDER — PALONOSETRON HCL INJECTION 0.25 MG/5ML
0.2500 mg | Freq: Once | INTRAVENOUS | Status: AC
  Administered 2019-04-02: 0.25 mg via INTRAVENOUS
  Filled 2019-04-02: qty 5

## 2019-04-02 MED ORDER — SODIUM CHLORIDE 0.9 % IV SOLN
150.0000 mg | Freq: Once | INTRAVENOUS | Status: AC
  Administered 2019-04-02: 150 mg via INTRAVENOUS
  Filled 2019-04-02: qty 150

## 2019-04-02 MED ORDER — SODIUM CHLORIDE 0.9 % IV SOLN
10.0000 mg | Freq: Once | INTRAVENOUS | Status: AC
  Administered 2019-04-02: 10 mg via INTRAVENOUS
  Filled 2019-04-02: qty 10

## 2019-04-02 MED ORDER — HEPARIN SOD (PORK) LOCK FLUSH 100 UNIT/ML IV SOLN
500.0000 [IU] | Freq: Once | INTRAVENOUS | Status: AC | PRN
  Administered 2019-04-02: 500 [IU]
  Filled 2019-04-02: qty 5

## 2019-04-02 MED ORDER — SODIUM CHLORIDE 0.9 % IV SOLN
40.0000 mg/m2 | Freq: Once | INTRAVENOUS | Status: AC
  Administered 2019-04-02: 60 mg via INTRAVENOUS
  Filled 2019-04-02: qty 60

## 2019-04-02 MED ORDER — SODIUM CHLORIDE 0.9% FLUSH
10.0000 mL | Freq: Once | INTRAVENOUS | Status: AC
  Administered 2019-04-02: 10 mL via INTRAVENOUS
  Filled 2019-04-02: qty 10

## 2019-04-02 NOTE — Progress Notes (Signed)
Mouth sore from cancer. Soft foods. Tender meat in little pieces.

## 2019-04-03 ENCOUNTER — Ambulatory Visit
Admission: RE | Admit: 2019-04-03 | Discharge: 2019-04-03 | Disposition: A | Discharge status: Home / Self Care | Payer: Medicare Other | Source: Ambulatory Visit | Attending: Radiation Oncology | Admitting: Radiation Oncology

## 2019-04-03 ENCOUNTER — Telehealth: Payer: Self-pay | Admitting: *Deleted

## 2019-04-03 DIAGNOSIS — C61 Malignant neoplasm of prostate: Secondary | ICD-10-CM | POA: Diagnosis not present

## 2019-04-03 DIAGNOSIS — J029 Acute pharyngitis, unspecified: Secondary | ICD-10-CM | POA: Diagnosis not present

## 2019-04-03 DIAGNOSIS — C329 Malignant neoplasm of larynx, unspecified: Secondary | ICD-10-CM | POA: Diagnosis not present

## 2019-04-03 DIAGNOSIS — R1310 Dysphagia, unspecified: Secondary | ICD-10-CM | POA: Diagnosis not present

## 2019-04-03 DIAGNOSIS — F419 Anxiety disorder, unspecified: Secondary | ICD-10-CM | POA: Diagnosis not present

## 2019-04-03 DIAGNOSIS — R634 Abnormal weight loss: Secondary | ICD-10-CM | POA: Diagnosis not present

## 2019-04-03 NOTE — Progress Notes (Signed)
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 Richardson Landry with symptoms of difficulty swallowing and persistent sore throat which has been ongoing for the last 1 year.NPL exam showed afriable mass involving the false vocal cord but did not involve the true vocal cords. True vocal cords remain mobile on exam. Hypopharynx and tongue base was clear.   This was followed by CT soft tissue neck which showed a 3.3 x 1.7 x 3.5 cm mass which was thought to be involving both the true and false vocal cords and extending anteriorly to involve the anterior commissure. Mass extends to involve the paraglottic space and erodes through the thyroid cartilage anteriorly and left of midline. There is also invasion of the preepiglottic space inferiorly. Enlarged cystic necrotic level 3/4 lymph node measuring 1.6 x 1.2 cm. Findings are consistent with nodal metastatic disease. Biopsy of the mass showed squamous cell carcinoma.  PET/CT showed hyppermetabolic mass extending from left glottis to anterior commisure. Solitary left level 2 metastatic Lymph node. No distant metastatic disease  Patient met with [facility] and decided against total laryngectomy.  Plan is for concurrent chemo/RT +/-adjuvant chemotherapy following chemo/RT    Interval history-still has ongoing pain with swallowing but would like to hold off on starting long-acting pain medicine at this time.  He has been using 1 to 1-1/2 tablets every 4-6 hours as needed for his pain.  He is currently using MiraLAX once or twice a day but reports that it has not been helping him with his constipation.  Previously he was using 2 senna tablets in addition to MiraLAX but felt that it gave him diarrhea.  ECOG PS- 1 Pain scale- 4 Opioid associated constipation- yes  Review of systems- Review of Systems  Constitutional: Positive for malaise/fatigue. Negative for chills, fever and weight loss.  HENT: Negative for congestion, ear discharge and nosebleeds.   Eyes: Negative for blurred vision.  Respiratory: Negative for cough, hemoptysis, sputum production, shortness of breath and wheezing.   Cardiovascular: Negative for chest pain, palpitations, orthopnea and claudication.  Gastrointestinal: Negative for abdominal pain, blood in stool, constipation, diarrhea, heartburn, melena, nausea and vomiting.       Pain with swallowing  Genitourinary: Negative for dysuria, flank pain, frequency, hematuria and urgency.  Musculoskeletal: Negative for back pain, joint pain and myalgias.  Skin: Negative for rash.  Neurological: Negative for dizziness, tingling, focal weakness, seizures, weakness and headaches.  Endo/Heme/Allergies: Does not bruise/bleed easily.  Psychiatric/Behavioral: Negative for depression and suicidal ideas. The patient does not have insomnia.        No Known Allergies   Past Medical History:  Diagnosis Date  . Anxiety   . Basal cell carcinoma   . Cigarette smoker   . Dental decay   . Dysphagia   . Dyspnea   .  End stage COPD ([place])    end stage copd, emphysema  . Erectile dysfunction   . Headache    every morning  . Hepatitis C    treated for 8 weeks this past year  with Mayvret  . History of nonmelanoma skin cancer   . Hoarseness, chronic   . Myocardial infarction [facility]) march/april 2020   mild heart attack  . Prostate cancer ([place])   . Respiratory failure, acute ([place])    was inpt for 5 days. thought he was going to die.  . Sleep apnea   . Squamous cell skin cancer   . Throat cancer ([place])   . Use of leuprolide acetate (Lupron)    treated for prostate cancer.   . 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     Past Surgical History:  Procedure Laterality Date  . FACIAL LACERATION REPAIR    . HERNIA REPAIR Left 1992   inguinal  . MICROLARYNGOSCOPY N/A 01/17/2019   Procedure: MICROLARYNGOSCOPY WITH BX;  Surgeon: Clyde Canterbury, MD;  Location: [facility] ORS;  Service: ENT;  Laterality: N/A;  . PORTA CATH INSERTION N/A 03/15/2019   Procedure: PORTA CATH INSERTION;  Surgeon: Algernon Huxley, MD;  Location: [place] CV LAB;  Service: Cardiovascular;  Laterality: N/A;  . PROSTATE BIOPSY    . SKIN CANCER EXCISION    . TRIGGER FINGER RELEASE Left 10/2018    Social History   Socioeconomic History  . Marital status: Significant Other    Spouse name: [name], girlfriend  . Number of children: Not on file  . Years of education: Not on file  . Highest education level: Not on file  Occupational History  . Occupation: Chief Strategy Officer    Comment: retired  Tobacco Use  . Smoking status: Current Every Day Smoker    Years: 52.00    Types: Cigarettes  . Smokeless tobacco: Never Used  . Tobacco comment: 5 cigarettes a day  Substance and Sexual Activity  . Alcohol use: Yes    Alcohol/week: 8.0 standard drinks    Types: 8 Cans of beer per week  . Drug use: Never  . Sexual activity: Yes  Other Topics Concern  . Not on file  Social History Narrative   Patient has retired d/t respiratory status. Currently lives with girlfriend    Social Determinants of Health   Financial Resource Strain: Low Risk   . Difficulty of Paying Living Expenses: Not hard  at all  Food Insecurity: No Food Insecurity  . Worried About Charity fundraiser in the Last Year: Never true  . Ran Out of Food in the Last Year: Never true  Transportation Needs: No Transportation Needs  . Lack of Transportation (Medical): No  . Lack of Transportation (Non-Medical): No  Physical Activity:   . Days of Exercise per Week:   . Minutes of Exercise per Session:   Stress: Stress Concern Present  . Feeling of Stress : Very much  Social Connections:   . Frequency of Communication with Friends and Family:   . Frequency of Social Gatherings with Friends and Family:   . Attends Religious Services:   . Active Member of Clubs or Organizations:   . Attends Archivist Meetings:   [name] Marital Status:   Intimate Partner Violence:   . Fear of Current or Ex-Partner:   . Emotionally Abused:   [name] Physically Abused:   . Sexually Abused:     Family History  Problem Relation Age of  Onset  . Cancer Sister   . Cancer - Cervical Mother   . Heart attack Brother   . Heart attack Maternal Grandfather      Current Outpatient Medications:  .  clonazePAM (KLONOPIN) 1 MG tablet, Take 1 tablet (1 mg total) by mouth 2 (two) times daily as needed for anxiety., Disp: 60 tablet, Rfl: 2 .  COMBIVENT RESPIMAT 20-100 MCG/ACT AERS respimat, INHALE 1 PUFF BY MOUTH INTO THE LUNGS EVERY 6 HOURS AS NEEDED, Disp: 4 g, Rfl: 2 .  dexamethasone (DECADRON) 4 MG tablet, Take 2 tablets by mouth once a day on the day after chemotherapy and then take 2 tablets two times a day for 2 days. Take with food., Disp: 30 tablet, Rfl: 1 .  fluticasone (FLONASE) 50 MCG/ACT nasal spray, 1 spray into each nostril twice a day, Disp: 16 g, Rfl: 2 .  ipratropium-albuterol (DUONEB) 0.5-2.5 (3) MG/3ML SOLN, Take 3 mLs by nebulization every 6 (six) hours as needed., Disp: 360 mL, Rfl: 11 .  lidocaine-prilocaine (EMLA) cream, Apply to affected area once, Disp: 30 g, Rfl: 3 .  ondansetron (ZOFRAN) 4 MG tablet, Take 1 tablet  (4 mg total) by mouth every 8 (eight) hours as needed for nausea or vomiting., Disp: 30 tablet, Rfl: 0 .  ondansetron (ZOFRAN) 8 MG tablet, Take 1 tablet (8 mg total) by mouth 2 (two) times daily as needed. Start on the third day after chemotherapy., Disp: 30 tablet, Rfl: 1 .  Oxycodone HCl 10 MG TABS, Take 1 tablet (10 mg total) by mouth every 4 (four) hours as needed., Disp: 90 tablet, Rfl: 0 .  OXYGEN, Inhale 3 L into the lungs daily., Disp: , Rfl:  .  prochlorperazine (COMPAZINE) 10 MG tablet, Take 1 tablet (10 mg total) by mouth every 6 (six) hours as needed (Nausea or vomiting)., Disp: 30 tablet, Rfl: 1 .  STIOLTO RESPIMAT 2.5-2.5 MCG/ACT AERS, Inhale 2 puffs into the lungs 2 (two) times daily as needed (shortness of breath). , Disp: , Rfl:  .  lidocaine (XYLOCAINE) 2 % solution, Use as directed 15 mLs in the mouth or throat every 6 (six) hours as needed for mouth pain (for sore throat). (Patient not taking: Reported on 04/02/2019), Disp: 100 mL, Rfl: 2 .  pantoprazole (PROTONIX) 20 MG tablet, Take 1 tablet (20 mg total) by mouth daily., Disp: 30 tablet, Rfl: 2  Physical exam:  Vitals:   04/02/19 0907  BP: 132/70  Pulse: 79  Resp: 16  Temp: (!) 96.5 F (35.8 C)  TempSrc: Tympanic  Weight: 109 lb (49.4 kg)   Physical Exam Constitutional:      General: He is not in acute distress.    Comments: On home oxygen  HENT:     Head: Normocephalic and atraumatic.  Eyes:     Pupils: Pupils are equal, round, and reactive to light.  Cardiovascular:     Rate and Rhythm: Normal rate and regular rhythm.     Heart sounds: Normal heart sounds.  Pulmonary:     Effort: Pulmonary effort is normal.     Breath sounds: Normal breath sounds.  Abdominal:     General: Bowel sounds are normal.     Palpations: Abdomen is soft.  Musculoskeletal:     Cervical back: Normal range of motion.  Skin:    General: Skin is warm and dry.  Neurological:     Mental Status: He is alert and oriented to person,  place, and time.  CMP Latest Ref Rng & Units 04/02/2019  Glucose 70 - 99 mg/dL 121(H)  BUN 8 - 23 mg/dL 13  Creatinine 0.61 - 1.24 mg/dL 0.56(L)  Sodium 135 - 145 mmol/L 133(L)  Potassium 3.5 - 5.1 mmol/L 4.0  Chloride 98 - 111 mmol/L 97(L)  CO2 22 - 32 mmol/L 31  Calcium 8.9 - 10.3 mg/dL 7.8(L)  Total Protein 6.0 - 8.5 g/dL -  Total Bilirubin 0.0 - 1.2 mg/dL -  Alkaline Phos 39 - 117 IU/L -  AST 0 - 40 IU/L -  ALT 0 - 44 IU/L -   CBC Latest Ref Rng & Units 04/02/2019  WBC 4.0 - 10.5 K/uL 7.3  Hemoglobin 13.0 - 17.0 g/dL 10.3(L)  Hematocrit 39.0 - 52.0 % 32.6(L)  Platelets 150 - 400 K/uL 181    No images are attached to the encounter.  PERIPHERAL VASCULAR CATHETERIZATION  Result Date: 03/15/2019 See op note    Assessment and plan- Patient is a 69 y.o. male withsquamous cell carcinoma of the supraglottic larynx stage IVA cT4a cN1cM0.    He is here for on treatment assessment prior to cycle 2 of weekly cisplatin chemotherapy  Counts okay to proceed with cycle 2 of weekly cisplatin chemotherapy today.  He does have chemo-induced anemia which we will continue to monitor.  He will directly proceed for cycle 3 next week and I will see him back in 2 weeks for cycle 4.  Neoplasm related pain: I have asked him to increase his oxycodone to 15 mg every 4 hours as needed.  Patient will let us know if he would like me to add OxyContin as a long-acting pain medicine to control his pain better  Chemo-induced anemia: We will add ferritin iron studies B12 and folate to next set of labs.   Visit Diagnosis 1. Squamous cell carcinoma of larynx (HCC)   2. Encounter for antineoplastic chemotherapy   3. Neoplasm related pain   4. Antineoplastic chemotherapy induced anemia      Dr. Randa Evens, MD, MPH [facility] at [facility] [phone] 04/03/2019 12:36 PM

## 2019-04-03 NOTE — Telephone Encounter (Signed)
Patient sister asking if she would be able to be around patient if she gets the COVID vaccine. Please advise

## 2019-04-03 NOTE — Telephone Encounter (Signed)
yes

## 2019-04-03 NOTE — Telephone Encounter (Signed)
Call returned to [facility] and advised of physician response on her voice mail.

## 2019-04-04 ENCOUNTER — Ambulatory Visit
Admission: RE | Admit: 2019-04-04 | Discharge: 2019-04-04 | Disposition: A | Discharge status: Home / Self Care | Payer: Medicare Other | Source: Ambulatory Visit | Attending: Radiation Oncology | Admitting: Radiation Oncology

## 2019-04-04 ENCOUNTER — Telehealth: Payer: Self-pay | Admitting: Nurse Practitioner

## 2019-04-04 DIAGNOSIS — J029 Acute pharyngitis, unspecified: Secondary | ICD-10-CM | POA: Diagnosis not present

## 2019-04-04 DIAGNOSIS — C61 Malignant neoplasm of prostate: Secondary | ICD-10-CM | POA: Diagnosis not present

## 2019-04-04 DIAGNOSIS — C329 Malignant neoplasm of larynx, unspecified: Secondary | ICD-10-CM | POA: Diagnosis not present

## 2019-04-04 DIAGNOSIS — R634 Abnormal weight loss: Secondary | ICD-10-CM | POA: Diagnosis not present

## 2019-04-04 DIAGNOSIS — F419 Anxiety disorder, unspecified: Secondary | ICD-10-CM | POA: Diagnosis not present

## 2019-04-04 DIAGNOSIS — R1310 Dysphagia, unspecified: Secondary | ICD-10-CM | POA: Diagnosis not present

## 2019-04-04 NOTE — Telephone Encounter (Signed)
Called and spoke to patient on telephone.

## 2019-04-04 NOTE — Telephone Encounter (Signed)
Copied from [place] [phone]. Topic: General - Other >> Apr 03, 2019  2:27 PM Wynetta Emery, [place] C wrote: Reason for CRM: pt called in to request a call back from provider. Pt says that he just haven't spoken to her in a while and wanted to hear her voice. Pt says that he just started Chemo.

## 2019-04-05 ENCOUNTER — Ambulatory Visit
Admission: RE | Admit: 2019-04-05 | Discharge: 2019-04-05 | Disposition: A | Discharge status: Home / Self Care | Payer: Medicare Other | Source: Ambulatory Visit | Attending: Radiation Oncology | Admitting: Radiation Oncology

## 2019-04-05 DIAGNOSIS — J029 Acute pharyngitis, unspecified: Secondary | ICD-10-CM | POA: Diagnosis not present

## 2019-04-05 DIAGNOSIS — F419 Anxiety disorder, unspecified: Secondary | ICD-10-CM | POA: Diagnosis not present

## 2019-04-05 DIAGNOSIS — R634 Abnormal weight loss: Secondary | ICD-10-CM | POA: Diagnosis not present

## 2019-04-05 DIAGNOSIS — C61 Malignant neoplasm of prostate: Secondary | ICD-10-CM | POA: Diagnosis not present

## 2019-04-05 DIAGNOSIS — C329 Malignant neoplasm of larynx, unspecified: Secondary | ICD-10-CM | POA: Diagnosis not present

## 2019-04-05 DIAGNOSIS — R1310 Dysphagia, unspecified: Secondary | ICD-10-CM | POA: Diagnosis not present

## 2019-04-06 ENCOUNTER — Ambulatory Visit: Payer: Medicare Other

## 2019-04-06 ENCOUNTER — Other Ambulatory Visit: Payer: Self-pay | Admitting: *Deleted

## 2019-04-06 MED ORDER — OXYCODONE HCL ER 10 MG PO T12A: 10.0000 mg | EXTENDED_RELEASE_TABLET | Freq: Two times a day (BID) | ORAL | 0 refills | Status: DC

## 2019-04-06 NOTE — Telephone Encounter (Signed)
[name] called reporting patient is having severe pain he awoke at 3 this morning and took 1.5 tabs of pain medicine at 4 and it is time to take it again , but he does not think he can come in for his radiation therapy today due to the pain and not feeling well. She does not know what to do for him an dis asking for advice.

## 2019-04-06 NOTE — Telephone Encounter (Signed)
Patient is currently taking 15 mg every 4 hours for pain. He just does not think he can come in for his treatment today. Please advise

## 2019-04-06 NOTE — Telephone Encounter (Signed)
We will add oxycontin 10 mg BID like we had discussed. If he comes for radiation, we can give him some pain medication here

## 2019-04-09 ENCOUNTER — Ambulatory Visit
Admission: RE | Admit: 2019-04-09 | Discharge: 2019-04-09 | Disposition: A | Discharge status: Home / Self Care | Payer: Medicare Other | Source: Ambulatory Visit | Attending: Radiation Oncology | Admitting: Radiation Oncology

## 2019-04-09 ENCOUNTER — Other Ambulatory Visit: Payer: Self-pay | Admitting: Pulmonary Disease

## 2019-04-09 ENCOUNTER — Inpatient Hospital Stay: Payer: Medicare Other

## 2019-04-09 ENCOUNTER — Ambulatory Visit: Payer: Medicare Other

## 2019-04-09 ENCOUNTER — Other Ambulatory Visit: Payer: Medicare Other

## 2019-04-09 ENCOUNTER — Other Ambulatory Visit: Payer: Self-pay

## 2019-04-09 VITALS — BP 107/59 | HR 80 | Temp 98.0°F | Resp 18 | Wt 104.0 lb

## 2019-04-09 DIAGNOSIS — D6481 Anemia due to antineoplastic chemotherapy: Secondary | ICD-10-CM

## 2019-04-09 DIAGNOSIS — T451X5A Adverse effect of antineoplastic and immunosuppressive drugs, initial encounter: Secondary | ICD-10-CM

## 2019-04-09 DIAGNOSIS — C329 Malignant neoplasm of larynx, unspecified: Secondary | ICD-10-CM

## 2019-04-09 DIAGNOSIS — Z5111 Encounter for antineoplastic chemotherapy: Secondary | ICD-10-CM | POA: Diagnosis not present

## 2019-04-09 DIAGNOSIS — E440 Moderate protein-calorie malnutrition: Secondary | ICD-10-CM | POA: Diagnosis not present

## 2019-04-09 DIAGNOSIS — R634 Abnormal weight loss: Secondary | ICD-10-CM | POA: Diagnosis not present

## 2019-04-09 DIAGNOSIS — R1310 Dysphagia, unspecified: Secondary | ICD-10-CM | POA: Diagnosis not present

## 2019-04-09 DIAGNOSIS — Z95828 Presence of other vascular implants and grafts: Secondary | ICD-10-CM

## 2019-04-09 DIAGNOSIS — C61 Malignant neoplasm of prostate: Secondary | ICD-10-CM | POA: Diagnosis not present

## 2019-04-09 DIAGNOSIS — F419 Anxiety disorder, unspecified: Secondary | ICD-10-CM | POA: Diagnosis not present

## 2019-04-09 DIAGNOSIS — J029 Acute pharyngitis, unspecified: Secondary | ICD-10-CM | POA: Diagnosis not present

## 2019-04-09 DIAGNOSIS — C321 Malignant neoplasm of supraglottis: Secondary | ICD-10-CM | POA: Diagnosis not present

## 2019-04-09 LAB — FOLATE: Folate: 20.4 ng/mL (ref >5.9)

## 2019-04-09 LAB — CBC WITH DIFFERENTIAL/PLATELET
Abs Immature Granulocytes: 0.02 10*3/uL (ref 0.00–0.07)
Basophils Absolute: 0 10*3/uL (ref 0.0–0.1)
Basophils Relative: 0 %
Eosinophils Absolute: 0.1 10*3/uL (ref 0.0–0.5)
Eosinophils Relative: 2 %
HCT: 33 % — ABNORMAL LOW (ref 39.0–52.0)
Hemoglobin: 10.4 g/dL — ABNORMAL LOW (ref 13.0–17.0)
Immature Granulocytes: 0 %
Lymphocytes Relative: 11 %
Lymphs Abs: 0.8 10*3/uL (ref 0.7–4.0)
MCH: 27.9 pg (ref 26.0–34.0)
MCHC: 31.5 g/dL (ref 30.0–36.0)
MCV: 88.5 fL (ref 80.0–100.0)
Monocytes Absolute: 0.9 10*3/uL (ref 0.1–1.0)
Monocytes Relative: 13 %
Neutro Abs: 5.6 10*3/uL (ref 1.7–7.7)
Neutrophils Relative %: 74 %
Platelets: 195 10*3/uL (ref 150–400)
RBC: 3.73 MIL/uL — ABNORMAL LOW (ref 4.22–5.81)
RDW: 14 % (ref 11.5–15.5)
WBC: 7.5 10*3/uL (ref 4.0–10.5)
nRBC: 0 % (ref 0.0–0.2)

## 2019-04-09 LAB — COMPREHENSIVE METABOLIC PANEL
ALT: 17 U/L (ref 0–44)
AST: 15 U/L (ref 15–41)
Albumin: 3.6 g/dL (ref 3.5–5.0)
Alkaline Phosphatase: 47 U/L (ref 38–126)
Anion gap: 8 (ref 5–15)
BUN: 14 mg/dL (ref 8–23)
CO2: 32 mmol/L (ref 22–32)
Calcium: 8.1 mg/dL — ABNORMAL LOW (ref 8.9–10.3)
Chloride: 93 mmol/L — ABNORMAL LOW (ref 98–111)
Creatinine, Ser: 0.49 mg/dL — ABNORMAL LOW (ref 0.61–1.24)
GFR calc Af Amer: >60 mL/min (ref >60)
GFR calc non Af Amer: >60 mL/min (ref >60)
Glucose, Bld: 150 mg/dL — ABNORMAL HIGH (ref 70–99)
Potassium: 4.2 mmol/L (ref 3.5–5.1)
Sodium: 133 mmol/L — ABNORMAL LOW (ref 135–145)
Total Bilirubin: 0.7 mg/dL (ref 0.3–1.2)
Total Protein: 5.7 g/dL — ABNORMAL LOW (ref 6.5–8.1)

## 2019-04-09 LAB — FERRITIN: Ferritin: 88 ng/mL (ref 24–336)

## 2019-04-09 LAB — MAGNESIUM: Magnesium: 1.9 mg/dL (ref 1.7–2.4)

## 2019-04-09 LAB — IRON AND TIBC
Iron: 65 ug/dL (ref 45–182)
Saturation Ratios: 24 % (ref 17.9–39.5)
TIBC: 274 ug/dL (ref 250–450)
UIBC: 209 ug/dL

## 2019-04-09 LAB — VITAMIN B12: Vitamin B-12: 263 pg/mL (ref 180–914)

## 2019-04-09 MED ORDER — SODIUM CHLORIDE 0.9 % IV SOLN
150.0000 mg | Freq: Once | INTRAVENOUS | Status: AC
  Administered 2019-04-09: 150 mg via INTRAVENOUS
  Filled 2019-04-09: qty 5

## 2019-04-09 MED ORDER — PALONOSETRON HCL INJECTION 0.25 MG/5ML
0.2500 mg | Freq: Once | INTRAVENOUS | Status: AC
  Administered 2019-04-09: 0.25 mg via INTRAVENOUS
  Filled 2019-04-09: qty 5

## 2019-04-09 MED ORDER — POTASSIUM CHLORIDE 2 MEQ/ML IV SOLN
Freq: Once | INTRAVENOUS | Status: AC
  Filled 2019-04-09: qty 1000

## 2019-04-09 MED ORDER — SODIUM CHLORIDE 0.9% FLUSH
10.0000 mL | Freq: Once | INTRAVENOUS | Status: AC
  Administered 2019-04-09: 10 mL via INTRAVENOUS
  Filled 2019-04-09: qty 10

## 2019-04-09 MED ORDER — SODIUM CHLORIDE 0.9 % IV SOLN
40.0000 mg/m2 | Freq: Once | INTRAVENOUS | Status: AC
  Administered 2019-04-09: 60 mg via INTRAVENOUS
  Filled 2019-04-09: qty 60

## 2019-04-09 MED ORDER — SODIUM CHLORIDE 0.9 % IV SOLN
10.0000 mg | Freq: Once | INTRAVENOUS | Status: AC
  Administered 2019-04-09: 10 mg via INTRAVENOUS
  Filled 2019-04-09: qty 10

## 2019-04-09 MED ORDER — HEPARIN SOD (PORK) LOCK FLUSH 100 UNIT/ML IV SOLN
500.0000 [IU] | Freq: Once | INTRAVENOUS | Status: AC | PRN
  Administered 2019-04-09: 500 [IU]
  Filled 2019-04-09: qty 5

## 2019-04-09 MED ORDER — SODIUM CHLORIDE 0.9 % IV SOLN
Freq: Once | INTRAVENOUS | Status: AC
  Filled 2019-04-09: qty 250

## 2019-04-09 MED ORDER — HEPARIN SOD (PORK) LOCK FLUSH 100 UNIT/ML IV SOLN
INTRAVENOUS | Status: AC
  Filled 2019-04-09: qty 5

## 2019-04-09 NOTE — Progress Notes (Addendum)
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  Patient receiving concurrent chemotherapy and radiation.  Met with patient in infusion.  Patient reports only able to eat applesauce this am due to pain on swallowing and thick mucus.  Reports that he was able to eat shrimp, fish hushpuppies yesterday but unable to tell RD how much or if anything else was eaten yesterday.  Patient sleepy during visit.  Did report that milky things increase mucus production.    Reports constipation taking miralax and senna.    Medications: viscous lidocaine, zofran, compazine, oxycodone  Labs: Na 133, glucose 150  Anthropometrics:   Weight 104 lb today decreased from 109 lb on 3/15.    107 lb on 03/26/19  NUTRITION DIAGNOSIS: Inadequate oral intake continues   INTERVENTION:  Provided samples of juice type shakes (boost soothe and ensure clear) for patient to try as "milky" shakes increase mucous production per patient.  Discussed strategies to help with dry mouth and thick saliva.  Handout provided.  Encouraged patient to continue with bowel regimen to help constipation with pain medications.  Patient verbalized understanding and reports he is drinking more fluids now than he ever has.   Recommend SLP evaluation with patient receiving radiation.  Message sent to provider. Patient has contact information    MONITORING, EVALUATION, GOAL: Patient will consume adequate calories and protein to prevent weight loss.     NEXT VISIT: March 29 th during infusion  Lenn Volker B. Zenia Resides, [place], [place] Registered Dietitian [phone] (pager)

## 2019-04-10 ENCOUNTER — Ambulatory Visit
Admission: RE | Admit: 2019-04-10 | Discharge: 2019-04-10 | Disposition: A | Discharge status: Home / Self Care | Payer: Medicare Other | Source: Ambulatory Visit | Attending: Radiation Oncology | Admitting: Radiation Oncology

## 2019-04-10 DIAGNOSIS — C61 Malignant neoplasm of prostate: Secondary | ICD-10-CM | POA: Diagnosis not present

## 2019-04-10 DIAGNOSIS — J029 Acute pharyngitis, unspecified: Secondary | ICD-10-CM | POA: Diagnosis not present

## 2019-04-10 DIAGNOSIS — R634 Abnormal weight loss: Secondary | ICD-10-CM | POA: Diagnosis not present

## 2019-04-10 DIAGNOSIS — F419 Anxiety disorder, unspecified: Secondary | ICD-10-CM | POA: Diagnosis not present

## 2019-04-10 DIAGNOSIS — R1310 Dysphagia, unspecified: Secondary | ICD-10-CM | POA: Diagnosis not present

## 2019-04-10 DIAGNOSIS — C329 Malignant neoplasm of larynx, unspecified: Secondary | ICD-10-CM | POA: Diagnosis not present

## 2019-04-11 ENCOUNTER — Ambulatory Visit
Admission: RE | Admit: 2019-04-11 | Discharge: 2019-04-11 | Disposition: A | Discharge status: Home / Self Care | Payer: Medicare Other | Source: Ambulatory Visit | Attending: Radiation Oncology | Admitting: Radiation Oncology

## 2019-04-11 DIAGNOSIS — F419 Anxiety disorder, unspecified: Secondary | ICD-10-CM | POA: Diagnosis not present

## 2019-04-11 DIAGNOSIS — C329 Malignant neoplasm of larynx, unspecified: Secondary | ICD-10-CM | POA: Diagnosis not present

## 2019-04-11 DIAGNOSIS — C61 Malignant neoplasm of prostate: Secondary | ICD-10-CM | POA: Diagnosis not present

## 2019-04-11 DIAGNOSIS — J029 Acute pharyngitis, unspecified: Secondary | ICD-10-CM | POA: Diagnosis not present

## 2019-04-11 DIAGNOSIS — R1310 Dysphagia, unspecified: Secondary | ICD-10-CM | POA: Diagnosis not present

## 2019-04-11 DIAGNOSIS — R634 Abnormal weight loss: Secondary | ICD-10-CM | POA: Diagnosis not present

## 2019-04-12 ENCOUNTER — Ambulatory Visit
Admission: RE | Admit: 2019-04-12 | Discharge: 2019-04-12 | Disposition: A | Discharge status: Home / Self Care | Payer: Medicare Other | Source: Ambulatory Visit | Attending: Radiation Oncology | Admitting: Radiation Oncology

## 2019-04-12 ENCOUNTER — Other Ambulatory Visit: Payer: Self-pay | Admitting: *Deleted

## 2019-04-12 DIAGNOSIS — C61 Malignant neoplasm of prostate: Secondary | ICD-10-CM | POA: Diagnosis not present

## 2019-04-12 DIAGNOSIS — C329 Malignant neoplasm of larynx, unspecified: Secondary | ICD-10-CM | POA: Diagnosis not present

## 2019-04-12 DIAGNOSIS — J029 Acute pharyngitis, unspecified: Secondary | ICD-10-CM | POA: Diagnosis not present

## 2019-04-12 DIAGNOSIS — F419 Anxiety disorder, unspecified: Secondary | ICD-10-CM | POA: Diagnosis not present

## 2019-04-12 DIAGNOSIS — R1310 Dysphagia, unspecified: Secondary | ICD-10-CM | POA: Diagnosis not present

## 2019-04-12 DIAGNOSIS — R634 Abnormal weight loss: Secondary | ICD-10-CM | POA: Diagnosis not present

## 2019-04-12 MED ORDER — OXYCODONE HCL 10 MG PO TABS: 10.0000 mg | ORAL_TABLET | ORAL | 0 refills | Status: DC | PRN

## 2019-04-13 ENCOUNTER — Ambulatory Visit
Admission: RE | Admit: 2019-04-13 | Discharge: 2019-04-13 | Disposition: A | Discharge status: Home / Self Care | Payer: Medicare Other | Source: Ambulatory Visit | Attending: Radiation Oncology | Admitting: Radiation Oncology

## 2019-04-13 DIAGNOSIS — C329 Malignant neoplasm of larynx, unspecified: Secondary | ICD-10-CM | POA: Diagnosis not present

## 2019-04-13 DIAGNOSIS — C61 Malignant neoplasm of prostate: Secondary | ICD-10-CM | POA: Diagnosis not present

## 2019-04-13 DIAGNOSIS — F419 Anxiety disorder, unspecified: Secondary | ICD-10-CM | POA: Diagnosis not present

## 2019-04-13 DIAGNOSIS — R634 Abnormal weight loss: Secondary | ICD-10-CM | POA: Diagnosis not present

## 2019-04-13 DIAGNOSIS — J029 Acute pharyngitis, unspecified: Secondary | ICD-10-CM | POA: Diagnosis not present

## 2019-04-13 DIAGNOSIS — R1310 Dysphagia, unspecified: Secondary | ICD-10-CM | POA: Diagnosis not present

## 2019-04-16 ENCOUNTER — Inpatient Hospital Stay (HOSPITAL_BASED_OUTPATIENT_CLINIC_OR_DEPARTMENT_OTHER): Payer: Medicare Other | Admitting: Oncology

## 2019-04-16 ENCOUNTER — Telehealth: Payer: Self-pay

## 2019-04-16 ENCOUNTER — Inpatient Hospital Stay: Payer: Medicare Other

## 2019-04-16 ENCOUNTER — Ambulatory Visit
Admission: RE | Admit: 2019-04-16 | Discharge: 2019-04-16 | Disposition: A | Discharge status: Home / Self Care | Payer: Medicare Other | Source: Ambulatory Visit | Attending: Radiation Oncology | Admitting: Radiation Oncology

## 2019-04-16 ENCOUNTER — Encounter: Payer: Self-pay | Admitting: Oncology

## 2019-04-16 VITALS — BP 106/56 | HR 76 | Temp 96.5°F | Ht 66.0 in | Wt 100.0 lb

## 2019-04-16 DIAGNOSIS — C329 Malignant neoplasm of larynx, unspecified: Secondary | ICD-10-CM

## 2019-04-16 DIAGNOSIS — D6481 Anemia due to antineoplastic chemotherapy: Secondary | ICD-10-CM | POA: Diagnosis not present

## 2019-04-16 DIAGNOSIS — E440 Moderate protein-calorie malnutrition: Secondary | ICD-10-CM | POA: Diagnosis not present

## 2019-04-16 DIAGNOSIS — G893 Neoplasm related pain (acute) (chronic): Secondary | ICD-10-CM

## 2019-04-16 DIAGNOSIS — T451X5A Adverse effect of antineoplastic and immunosuppressive drugs, initial encounter: Secondary | ICD-10-CM | POA: Diagnosis not present

## 2019-04-16 DIAGNOSIS — R634 Abnormal weight loss: Secondary | ICD-10-CM | POA: Diagnosis not present

## 2019-04-16 DIAGNOSIS — C321 Malignant neoplasm of supraglottis: Secondary | ICD-10-CM | POA: Diagnosis not present

## 2019-04-16 DIAGNOSIS — Z5111 Encounter for antineoplastic chemotherapy: Secondary | ICD-10-CM | POA: Diagnosis not present

## 2019-04-16 DIAGNOSIS — R1310 Dysphagia, unspecified: Secondary | ICD-10-CM | POA: Diagnosis not present

## 2019-04-16 LAB — MAGNESIUM: Magnesium: 1.8 mg/dL (ref 1.7–2.4)

## 2019-04-16 LAB — COMPREHENSIVE METABOLIC PANEL
ALT: 20 U/L (ref 0–44)
AST: 17 U/L (ref 15–41)
Albumin: 3.6 g/dL (ref 3.5–5.0)
Alkaline Phosphatase: 53 U/L (ref 38–126)
Anion gap: 6 (ref 5–15)
BUN: 14 mg/dL (ref 8–23)
CO2: 30 mmol/L (ref 22–32)
Calcium: 8.1 mg/dL — ABNORMAL LOW (ref 8.9–10.3)
Chloride: 95 mmol/L — ABNORMAL LOW (ref 98–111)
Creatinine, Ser: 0.6 mg/dL — ABNORMAL LOW (ref 0.61–1.24)
GFR calc Af Amer: >60 mL/min (ref >60)
GFR calc non Af Amer: >60 mL/min (ref >60)
Glucose, Bld: 115 mg/dL — ABNORMAL HIGH (ref 70–99)
Potassium: 4.1 mmol/L (ref 3.5–5.1)
Sodium: 131 mmol/L — ABNORMAL LOW (ref 135–145)
Total Bilirubin: 0.6 mg/dL (ref 0.3–1.2)
Total Protein: 6.2 g/dL — ABNORMAL LOW (ref 6.5–8.1)

## 2019-04-16 LAB — CBC WITH DIFFERENTIAL/PLATELET
Abs Immature Granulocytes: 0.02 10*3/uL (ref 0.00–0.07)
Basophils Absolute: 0 10*3/uL (ref 0.0–0.1)
Basophils Relative: 1 %
Eosinophils Absolute: 0.1 10*3/uL (ref 0.0–0.5)
Eosinophils Relative: 1 %
HCT: 31.5 % — ABNORMAL LOW (ref 39.0–52.0)
Hemoglobin: 10.2 g/dL — ABNORMAL LOW (ref 13.0–17.0)
Immature Granulocytes: 0 %
Lymphocytes Relative: 8 %
Lymphs Abs: 0.6 10*3/uL — ABNORMAL LOW (ref 0.7–4.0)
MCH: 27.7 pg (ref 26.0–34.0)
MCHC: 32.4 g/dL (ref 30.0–36.0)
MCV: 85.6 fL (ref 80.0–100.0)
Monocytes Absolute: 0.9 10*3/uL (ref 0.1–1.0)
Monocytes Relative: 12 %
Neutro Abs: 5.9 10*3/uL (ref 1.7–7.7)
Neutrophils Relative %: 78 %
Platelets: 212 10*3/uL (ref 150–400)
RBC: 3.68 MIL/uL — ABNORMAL LOW (ref 4.22–5.81)
RDW: 14.2 % (ref 11.5–15.5)
WBC: 7.6 10*3/uL (ref 4.0–10.5)
nRBC: 0 % (ref 0.0–0.2)

## 2019-04-16 MED ORDER — DEXAMETHASONE SODIUM PHOSPHATE 10 MG/ML IJ SOLN
10.0000 mg | Freq: Once | INTRAMUSCULAR | Status: AC
  Administered 2019-04-16: 10 mg via INTRAVENOUS
  Filled 2019-04-16: qty 1

## 2019-04-16 MED ORDER — HEPARIN SOD (PORK) LOCK FLUSH 100 UNIT/ML IV SOLN
500.0000 [IU] | Freq: Once | INTRAVENOUS | Status: AC
  Administered 2019-04-16: 500 [IU] via INTRAVENOUS
  Filled 2019-04-16: qty 5

## 2019-04-16 MED ORDER — MORPHINE SULFATE 2 MG/ML IJ SOLN
4.0000 mg | Freq: Once | INTRAMUSCULAR | Status: AC
  Administered 2019-04-16: 4 mg via INTRAVENOUS
  Filled 2019-04-16: qty 2

## 2019-04-16 MED ORDER — HEPARIN SOD (PORK) LOCK FLUSH 100 UNIT/ML IV SOLN
INTRAVENOUS | Status: AC
  Filled 2019-04-16: qty 5

## 2019-04-16 MED ORDER — SODIUM CHLORIDE 0.9 % IV SOLN
Freq: Once | INTRAVENOUS | Status: AC
  Filled 2019-04-16: qty 250

## 2019-04-16 MED ORDER — SODIUM CHLORIDE 0.9% FLUSH
10.0000 mL | INTRAVENOUS | Status: DC | PRN
  Administered 2019-04-16: 10 mL via INTRAVENOUS
  Filled 2019-04-16: qty 10

## 2019-04-16 MED ORDER — PROCHLORPERAZINE EDISYLATE 10 MG/2ML IJ SOLN
10.0000 mg | Freq: Once | INTRAMUSCULAR | Status: AC
  Administered 2019-04-16: 10 mg via INTRAVENOUS
  Filled 2019-04-16: qty 2

## 2019-04-16 MED ORDER — SODIUM CHLORIDE 0.9 % IV SOLN: 10.0000 mg | Freq: Once | INTRAVENOUS | Status: DC

## 2019-04-16 NOTE — Telephone Encounter (Signed)
Nutrition  Patient called RD back this pm.    Patient reports appetite is poor and mouth is raw and unable to eat.   Spoke with [name], significant other as well and she is concerned about having to take care of patient and father that is ill.    Intervention: RD spoke to patient and girlfriend separately regarding feeding tube.   RD will plan to meet with patient and girlfriend at 9:30am on 4/1 to review feeding tube and show sample tube so they can make decision about how to proceed.      Rykar Lebleu B. Zenia Resides, [place], [place] Registered Dietitian [phone] (pager)

## 2019-04-16 NOTE — Progress Notes (Signed)
Nutrition  RD planning to meet with patient during infusion this am.  Patient had already left infusion area when RD arrived to see him.  Patient did not receive chemotherapy today, but received fluids and steriods.    Noted radiation treatments have been cancelled for this week.   Weight: 100lb today decreased from 104 lb on 3/22 109 lb on 3/15  Intervention: Dr. Janese Banks spoke with RD today and has discussed PEG tube placement with patient today at visit.  Agree with PEG tube placement.  Patient is considering placement.  RD to follow-up with patient  Karalynn Cottone B. Zenia Resides, [place], [place] Registered Dietitian [phone] (pager)

## 2019-04-16 NOTE — Telephone Encounter (Signed)
Nutrition  RD called patient for follow-up.  No answer or option to leave voicemail.    Next visit: 4/1 during fluids  [name] B. Zenia Resides, [place], [place] Registered Dietitian [phone] (pager)

## 2019-04-17 ENCOUNTER — Telehealth: Payer: Medicare Other | Admitting: General Practice

## 2019-04-17 ENCOUNTER — Ambulatory Visit: Payer: Medicare Other

## 2019-04-17 ENCOUNTER — Telehealth: Payer: Self-pay | Admitting: *Deleted

## 2019-04-17 ENCOUNTER — Ambulatory Visit (INDEPENDENT_AMBULATORY_CARE_PROVIDER_SITE_OTHER): Payer: Medicare Other | Admitting: General Practice

## 2019-04-17 DIAGNOSIS — Z7189 Other specified counseling: Secondary | ICD-10-CM

## 2019-04-17 DIAGNOSIS — C329 Malignant neoplasm of larynx, unspecified: Secondary | ICD-10-CM

## 2019-04-17 DIAGNOSIS — J449 Chronic obstructive pulmonary disease, unspecified: Secondary | ICD-10-CM

## 2019-04-17 NOTE — Telephone Encounter (Signed)
I called Kevin Nelson as well today

## 2019-04-17 NOTE — Telephone Encounter (Signed)
Patient brother Marylyn [facility]) called with great concern over hte t condition and that he is not eating and has lost weight to 100 lbs. He had numerous questions regarding what to do and what exact type of tc the patient is getting from xrt and chemotherapy, and curative aspect of treatment standpoint. I answered his questions and told him that patient has already put himself on a week long break from treatment and that a dietary referral has been made and PEG tube placement has been discussed and that patient is considering that. I also suggested that on patient visist next week that he be on a call with him dring htat visit to hear wht doctor has to say and can ask questions as well. He was in agreement with this plan. He was told of ensure alive since patient does not want the milky type of supplement and I told him I would check with office staff to see if we had any samples or coupons for this. He also asked about redoing the HCPOA form since he has misplaced his copy during a move and it was not done at our facility, I asked that Advanced Directives form also be given to patient.

## 2019-04-17 NOTE — Chronic Care Management (AMB) (Signed)
Chronic Care Management   Follow Up Note   04/17/2019 Name: Kevin Nelson MRN: [id number] DOB: November 12, 1950  Referred by: [name], NP Reason for referral : Chronic Care Management (RNCM Chronic Disease Management and Care Coordination Needs follow up)   Kevin Nelson is a 69 y.o. year old male who is a primary care patient of Cannady, [name], NP. The CCM team was consulted for assistance with chronic disease management and care coordination needs.    Review of patient status, including review of consultants reports, relevant laboratory and other test results, and collaboration with appropriate care team members and the patient's provider was performed as part of comprehensive patient evaluation and provision of chronic care management services.    SDOH (Social Determinants of Health) assessments performed: No See Care Plan activities for detailed interventions related to [facility])     Outpatient Encounter Medications as of 04/17/2019  Medication Sig  . clonazePAM (KLONOPIN) 1 MG tablet Take 1 tablet (1 mg total) by mouth 2 (two) times daily as needed for anxiety.  . COMBIVENT RESPIMAT 20-100 MCG/ACT AERS respimat INHALE 1 PUFF BY MOUTH INTO THE LUNGS EVERY 6 HOURS AS NEEDED  . dexamethasone (DECADRON) 4 MG tablet Take 2 tablets by mouth once a day on the day after chemotherapy and then take 2 tablets two times a day for 2 days. Take with food.  . fluticasone (FLONASE) 50 MCG/ACT nasal spray 1 spray into each nostril twice a day  . ipratropium-albuterol (DUONEB) 0.5-2.5 (3) MG/3ML SOLN Take 3 mLs by nebulization every 6 (six) hours as needed.  . lidocaine (XYLOCAINE) 2 % solution Use as directed 15 mLs in the mouth or throat every 6 (six) hours as needed for mouth pain (for sore throat).  [name] lidocaine-prilocaine (EMLA) cream Apply to affected area once  . ondansetron (ZOFRAN) 4 MG tablet Take 1 tablet (4 mg total) by mouth every 8 (eight) hours as needed for nausea or vomiting.  .  ondansetron (ZOFRAN) 8 MG tablet Take 1 tablet (8 mg total) by mouth 2 (two) times daily as needed. Start on the third day after chemotherapy.  [name] oxyCODONE (OXYCONTIN) 10 mg 12 hr tablet Take 1 tablet (10 mg total) by mouth every 12 (twelve) hours.  . Oxycodone HCl 10 MG TABS Take 1 tablet (10 mg total) by mouth every 4 (four) hours as needed.  . OXYGEN Inhale 3 L into the lungs daily.  . pantoprazole (PROTONIX) 20 MG tablet Take 1 tablet (20 mg total) by mouth daily.  . prochlorperazine (COMPAZINE) 10 MG tablet Take 1 tablet (10 mg total) by mouth every 6 (six) hours as needed (Nausea or vomiting).  . STIOLTO RESPIMAT 2.5-2.5 MCG/ACT AERS Inhale 2 puffs into the lungs 2 (two) times daily as needed (shortness of breath).    No facility-administered encounter medications on file as of 04/17/2019.     Objective:   Goals Addressed            This Visit's Progress   . RN-COPD Management/Laryngeal Cancer diagnosis (pt-stated)       Current Barriers:  [name] Knowledge deficits related to basic COPD self care/management . Knowledge deficits related to Laryngeal Cancer and self/care management  . Financial barriers . Limited Social Support   Case Manager Clinical Goal(s):  Over the next 90 days, patient will be able to verbalize understanding of COPD action plan and when to seek appropriate levels of medical care  Over the next 90 days, patient will engage in lite  exercise as tolerated to build/regain stamina and strength and reduce shortness of breath through activity tolerance  Over the next 90 days, patient will verbalize basic understanding of COPD disease process and self care activities  Over the next 90 days the patient witl verbalized basic understanding of Laryngeal Cancer disease process and self care activities    Interventions:  . Reviewed COPD action plan . Patient reports having all COPD medications . Reviewed upcoming appointment with Pulmonology.  The patient is doing some  "soul searching" and talking to his fiance about important decisions that need to be made concerning his health and well being/care . Patient reporting he is taking his medications as directed . Continues to smoke, with no interest in quitting  . Continues to be on 02 . Evaluation of current status of Laryngeal cancer and treatment options.  The patient states the "doctor" has told him he only has 6 months to live.  He is unsure at this time whether he will have surgery or not. He is discussing this "big" decision with his fiance "Kevin Nelson" . The patient has been taking chemo treatments but on outreach today at the advice of his sister who is a MD in Home and another relative that is a Therapist, sports he is taking a week off from his treatments. The patient states that the MD at the cancer clinic got very ugly with him and the patient is very upset. Empathetic listening and offer to have LCSW call but the patient verbalized he did not want to talk to anyone else right now. Call was a brief call.    Patient Self Care Activities:  Takes medications as prescribed including inhalers  Utilizes infection prevention strategies to reduce risk of respiratory infection   Does not currently perform self care activities related to COPD management  Please see past updates related to this goal by clicking on the "Past Updates" button in the selected goal      . RN: I have some big decisions to make       Current Barriers:  . Financial Constraints.  . Chronic Disease Management support and education needs related to Chronic health conditions impacting the patients care, currently having to decide about treatment options for Laryngeal cancer and the patients concern of "quality of life vs quantity of life" . No Advanced Directives in place  Nurse Case Manager Clinical Goal(s):  [name] Over the next 120 days, patient will work with pcp and CCM team to address needs related to prognosis of patient with chronic medical  conditions/terminal illness diagnosis . Over the next 120 days, patient will attend all scheduled medical appointments: patient has been actively taking chemotherapy but has decided to take a week off. . Over the next 120 days, patient will demonstrate improved health management independence as evidenced byremaining independent with his care and ability to make decisions about his health and well being independently . Over the next 90 days, patient will work with CM team pharmacist to manage medications . Over the next 90 days, patient will work with CM clinical social worker to decrease stress and anxiety and assist as needed with financial needs . Over the next 90 days, the patient will work with the care management team towards completion of advanced directives  Interventions:  . Evaluation of current treatment plan related to chronic medical conditions/terminal illness diagnosis and patient's adherence to plan as established by provider. The patient is actively taking chemotherapy at this time but at the direction of his  sister who is a MD and another relative who is a Therapist, sports the patient has decided to take a week off. He is very sick and upset because the MD at the cancer center and staff per the patient were "very ugly" to him and "my woman".  The patient is going to get fluids to help with dehydration but is not taking treatments this week.  . Provided education to patient and/or caregiver about advanced directives . Provided education to patient re: giving the patient samples of Ensure to try to see if this is something that will help with his nutritional health and goals.  The patient states he has sores in his mouth and this is preventing him from eating or drinking much of anything . Reviewed medications with patient and discussed compliance . Collaborated with CCM team regarding ongoing support and needs related to complex chronic medical conditions and terminal illness diagnosis.  The patient is  very upset right now. He says he is not upset with the CCM team but does not wish to talk to the LCSW at this time.  . Discussed plans with patient for ongoing care management follow up and provided patient with direct contact information for care management team . Reviewed scheduled/upcoming provider appointments including: no upcoming appointments noted with the pcp.  The patient goes on 4/1 to get IV fluids at the cancer center and will determine at that time when he will start back with treatments . Social Work referral for continued support for emotional and financial needs . Pharmacy referral for continued medication management and support  Patient Self Care Activities:  . Patient verbalizes understanding of plan to assist in management of chronic medical conditions and terminal illness diagnosis . Self administers medications as prescribed . Attends all scheduled provider appointments . Calls provider office for new concerns or questions . Unable to independently decide the best course of action for his chronic medical condition and terminal illness diagnosis   Please see past updates related to this goal by clicking on the "Past Updates" button in the selected goal          Plan:   The care management team will reach out to the patient again over the next 30 to 60 days.    Noreene Larsson RN, MSN, [facility] Mobile: [phone]

## 2019-04-17 NOTE — Patient Instructions (Signed)
Visit Information  Goals Addressed            This Visit's Progress    RN-COPD Management/Laryngeal Cancer diagnosis (pt-stated)       Current Barriers:   Knowledge deficits related to basic COPD self care/management  Knowledge deficits related to Laryngeal Cancer and self/care management   Financial barriers  Limited Social Support   Case Manager Clinical Goal(s):  Over the next 90 days, patient will be able to verbalize understanding of COPD action plan and when to seek appropriate levels of medical care  Over the next 90 days, patient will engage in lite exercise as tolerated to build/regain stamina and strength and reduce shortness of breath through activity tolerance  Over the next 90 days, patient will verbalize basic understanding of COPD disease process and self care activities  Over the next 90 days the patient witl verbalized basic understanding of Laryngeal Cancer disease process and self care activities    Interventions:   Reviewed COPD action plan  Patient reports having all COPD medications  Reviewed upcoming appointment with Pulmonology.  The patient is doing some "soul searching" and talking to his fiance about important decisions that need to be made concerning his health and well being/care  Patient reporting he is taking his medications as directed  Continues to smoke, with no interest in quitting   Continues to be on 02  Evaluation of current status of Laryngeal cancer and treatment options.  The patient states the "doctor" has told him he only has 6 months to live.  He is unsure at this time whether he will have surgery or not. He is discussing this "big" decision with his fiance "[name]"  The patient has been taking chemo treatments but on outreach today at the advice of his sister who is a MD in [place] and another relative that is a Therapist, sports he is taking a week off from his treatments. The patient states that the MD at the cancer clinic got very ugly  with him and the patient is very upset. Empathetic listening and offer to have LCSW call but the patient verbalized he did not want to talk to anyone else right now. Call was a brief call.    Patient Self Care Activities:  Takes medications as prescribed including inhalers  Utilizes infection prevention strategies to reduce risk of respiratory infection   Does not currently perform self care activities related to COPD management  Please see past updates related to this goal by clicking on the "Past Updates" button in the selected goal       RN: I have some big decisions to make       Current Barriers:   Financial Constraints.   Chronic Disease Management support and education needs related to Chronic health conditions impacting the patients care, currently having to decide about treatment options for Laryngeal cancer and the patients concern of "quality of life vs quantity of life"  No Advanced Directives in place  Nurse Case Manager Clinical Goal(s):   Over the next 120 days, patient will work with pcp and CCM team to address needs related to prognosis of patient with chronic medical conditions/terminal illness diagnosis  Over the next 120 days, patient will attend all scheduled medical appointments: patient has been actively taking chemotherapy but has decided to take a week off.  Over the next 120 days, patient will demonstrate improved health management independence as evidenced byremaining independent with his care and ability to make decisions about his health and  well being independently  Over the next 90 days, patient will work with CM team pharmacist to manage medications  Over the next 90 days, patient will work with CM clinical social worker to decrease stress and anxiety and assist as needed with financial needs  Over the next 90 days, the patient will work with the care management team towards completion of advanced directives  Interventions:   Evaluation of  current treatment plan related to chronic medical conditions/terminal illness diagnosis and patient's adherence to plan as established by provider. The patient is actively taking chemotherapy at this time but at the direction of his sister who is a MD and another relative who is a Therapist, sports the patient has decided to take a week off. He is very sick and upset because the MD at the cancer center and staff per the patient were "very ugly" to him and "my woman".  The patient is going to get fluids to help with dehydration but is not taking treatments this week.   Provided education to patient and/or caregiver about advanced directives  Provided education to patient re: giving the patient samples of Ensure to try to see if this is something that will help with his nutritional health and goals.  The patient states he has sores in his mouth and this is preventing him from eating or drinking much of anything  Reviewed medications with patient and discussed compliance  Collaborated with CCM team regarding ongoing support and needs related to complex chronic medical conditions and terminal illness diagnosis.  The patient is very upset right now. He says he is not upset with the CCM team but does not wish to talk to the LCSW at this time.   Discussed plans with patient for ongoing care management follow up and provided patient with direct contact information for care management team  Reviewed scheduled/upcoming provider appointments including: no upcoming appointments noted with the pcp.  The patient goes on 4/1 to get IV fluids at the cancer center and will determine at that time when he will start back with treatments  Social Work referral for continued support for emotional and financial needs  Pharmacy referral for continued medication management and support  Patient Self Care Activities:   Patient verbalizes understanding of plan to assist in management of chronic medical conditions and terminal illness  diagnosis  Self administers medications as prescribed  Attends all scheduled provider appointments  Calls provider office for new concerns or questions  Unable to independently decide the best course of action for his chronic medical condition and terminal illness diagnosis   Please see past updates related to this goal by clicking on the "Past Updates" button in the selected goal         Patient verbalizes understanding of instructions provided today.   The care management team will reach out to the patient again over the next 30 to 60 days.   Noreene Larsson RN, MSN, [facility] Mobile: [phone]

## 2019-04-18 ENCOUNTER — Ambulatory Visit: Payer: Medicare Other

## 2019-04-18 NOTE — Therapy (Signed)
[facility] [address] [place], [place], [address] Phone: [phone]   Fax:  [phone]  Speech Language Pathology Evaluation  Patient Details  Name: Kevin Nelson MRN: [id number] Date of Birth: 1950/10/14 No data recorded  Encounter Date: 04/13/2019    Past Medical History:  Diagnosis Date  . Anxiety   . Basal cell carcinoma   . Cigarette smoker   . Dental decay   . Dysphagia   . Dyspnea   . End stage COPD ([place])    end stage copd, emphysema  . Erectile dysfunction   . Headache    every morning  . Hepatitis C    treated for 8 weeks this past year with Mayvret  . History of nonmelanoma skin cancer   . Hoarseness, chronic   . Myocardial infarction [facility]) march/april 2020   mild heart attack  . Prostate cancer ([place])   . Respiratory failure, acute ([place])    was inpt for 5 days. thought he was going to die.  . Sleep apnea   . Squamous cell skin cancer   . Throat cancer ([place])   . Use of leuprolide acetate (Lupron)    treated for prostate cancer.   . 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    Past Surgical History:  Procedure Laterality Date  . FACIAL LACERATION REPAIR    . HERNIA REPAIR Left 1992   inguinal  . MICROLARYNGOSCOPY N/A 01/17/2019   Procedure: MICROLARYNGOSCOPY WITH BX;  Surgeon: Clyde Canterbury, MD;  Location: [facility] ORS;  Service: ENT;  Laterality: N/A;  . PORTA CATH INSERTION N/A 03/15/2019   Procedure: PORTA CATH INSERTION;  Surgeon: Algernon Huxley, MD;  Location: [facility];  Service: Cardiovascular;  Laterality: N/A;  . PROSTATE BIOPSY    . SKIN CANCER EXCISION    . TRIGGER FINGER RELEASE Left 10/2018    There were no vitals filed for this visit.     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  Contacted pt by Telephone to discuss swallowing and any concerns of dysphagia. Pt endorsed difficulty swallowing,  extreme "soreness" and "pain" when swallowing, "raw mouth", and decreased oral intake overall for "some time now". Pt endorsed weight loss as well as no appetite and no taste for foods. He stated he has been "trying" some solid foods but finds that drinking liquids is "easier" d/t the soreness of his mouth. He does not like the "typical" Ensure or anything "too milky" d/t increased phlegm and/or saliva in mouth and throat. Dietician is following.  Encouraged pt to f/u w/ MD re: oral solutions and pain medications; discussed concern of using any throat sprays that might have a numbing effect Before drinking/eating, especially drinking of thin liquids as it might numb the BOT for trigger of pharyngeal swallow. Pt denied any treatment for pneumonia currently. Discussed w/ pt the option of coming in for an objective swallow study to best assess the oropharyngeal swallow function some time soon. Pt agreed. Will f/u w/ pt at office visit next week to schedule on a convenient day for pt.                     Problem List Patient Active Problem List   Diagnosis Date Noted  . Goals of care, counseling/discussion 01/25/2019  . Squamous cell carcinoma of larynx ([place]) 01/04/2019  . Numbness of left foot 12/21/2018  .  Chronic nonintractable headache 11/10/2018  . Sensorineural hearing loss (SNHL) 08/21/2018  . Coronary atherosclerosis 07/31/2018  . Chronic hepatitis ([place]) 04/21/2018  . Lung nodule < 6cm on CT 04/19/2018  . Senile purpura ([place]) 04/19/2018  . Protein-calorie malnutrition ([place]) 04/19/2018  . Nicotine dependence, cigarettes, uncomplicated [id number]  . Controlled substance agreement signed 01/27/2018  . COPD, very severe ([place]) 01/13/2018  . Prostate cancer () 01/13/2018  . History of basal cell carcinoma (BCC) 01/13/2018  . Generalized anxiety disorder 01/13/2018      Orinda Kenner, [place], CCC-SLP [facility] Appenzeller 04/18/2019, 4:09 PM  [facility] [address], [place], [place], [address] Phone: [phone]   Fax:  [phone]  Name: Kevin Nelson MRN: [id number] Date of Birth: 06/12/1950

## 2019-04-18 NOTE — Progress Notes (Signed)
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 True vocal cords remain mobile on exam. Hypopharynx and tongue base was clear.   This was followed by CT soft tissue neck which showed a 3.3 x 1.7 x 3.5 cm mass which was thought to be involving both the true and false vocal cords and extending anteriorly to involve the anterior commissure. Mass extends to involve the paraglottic space and erodes through the thyroid cartilage anteriorly and left of midline. There is also invasion of the preepiglottic space inferiorly. Enlarged cystic necrotic level 3/4 lymph node measuring 1.6 x 1.2 cm. Findings are consistent with nodal metastatic disease. Biopsy of the mass showed squamous cell carcinoma.  PET/CT showed hyppermetabolic mass extending from left glottis to anterior commisure. Solitary left level 2 metastatic Lymph node. No distant metastatic disease  Patient met with [facility] E[facility] and decided against total laryngectomy.  Plan is for concurrent chemo/RT +/-adjuvant chemotherapy following chemo/RT    Interval history- Patient with 117 pounds in January 2021.  By the time he started in February he was down 105 pounds.  After 3 cycles of chemotherapy he has gone down further to 100 pounds.  He reports ongoing pain during swallowing.  Food does not taste good and he has not been able to keep up with his Ensure intake.  Reports increased fatigue  ECOG PS- 1 Pain scale- 5 Opioid associated constipation- no  Review of systems- Review of Systems  Constitutional: Positive for malaise/fatigue and weight loss. Negative for chills and fever.  HENT: Negative for congestion, ear discharge and nosebleeds.   Eyes: Negative for blurred vision.  Respiratory: Negative for cough, hemoptysis, sputum production, shortness of breath and wheezing.   Cardiovascular: Negative for chest pain, palpitations, orthopnea and claudication.  Gastrointestinal: Negative for abdominal pain, blood in stool, constipation, diarrhea, heartburn, melena, nausea and vomiting.       Pain during swallowing  Genitourinary: Negative for dysuria, flank pain, frequency, hematuria and urgency.  Musculoskeletal: Negative for back pain, joint pain and myalgias.  Skin: Negative for rash.  Neurological: Negative for dizziness, tingling, focal weakness, seizures, weakness and headaches.  Endo/Heme/Allergies: Does not bruise/bleed easily.  Psychiatric/Behavioral: Negative for depression and suicidal ideas. The patient does not have insomnia.        No Known Allergies   Past Medical History:  Diagnosis Date  . Anxiety   . Basal cell carcinoma   . Cigarette smoker   . Dental decay   . Dysphagia   . Dyspnea   . End stage COPD ([place])    end stage copd, emphysema  .  Erectile dysfunction   . Headache    every morning  . Hepatitis C    treated for 8 weeks this past year with Mayvret  . History of nonmelanoma skin cancer   . Hoarseness, chronic   .  Myocardial infarction [facility]) march/april 2020   mild heart attack  . Prostate cancer ([place])   . Respiratory failure, acute ([place])    was inpt for 5 days. thought he was going to die.  . Sleep apnea   . Squamous cell skin cancer   . Throat cancer ([place])   . Use of leuprolide acetate (Lupron)    treated for prostate cancer.   . 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     Past Surgical History:  Procedure Laterality Date  . FACIAL LACERATION REPAIR    . HERNIA REPAIR Left 1992   inguinal  . MICROLARYNGOSCOPY N/A 01/17/2019   Procedure: MICROLARYNGOSCOPY WITH BX;  Surgeon: Kevin Canterbury, MD;  Location: [facility] ORS;  Service: ENT;  Laterality: N/A;  . PORTA CATH INSERTION N/A 03/15/2019   Procedure: PORTA CATH INSERTION;  Surgeon: Kevin Huxley, MD;  Location: [facility];  Service: Cardiovascular;  Laterality: N/A;  . PROSTATE BIOPSY    . SKIN CANCER EXCISION    . TRIGGER FINGER RELEASE Left 10/2018    Social History   Socioeconomic History  . Marital status: Significant Other    Spouse name: Kevin Nelson, girlfriend  . Number of children: Not on file  . Years of education: Not on file  . Highest education level: Not on file  Occupational History  . Occupation: Chief Strategy Officer    Comment: retired  Tobacco Use  . Smoking status: Current Every Day Smoker    Years: 52.00    Types: Cigarettes  . Smokeless tobacco: Never Used  . Tobacco comment: 5 cigarettes a day  Substance and Sexual Activity  . Alcohol use: Yes    Alcohol/week: 8.0 standard drinks    Types: 8 Cans of beer per week  . Drug use: Never  . Sexual activity: Yes  Other Topics Concern  . Not on file  Social History Narrative   Patient has retired d/t respiratory status. Currently lives with girlfriend    Social Determinants of Health   Financial Resource Strain: Low Risk   . Difficulty of Paying Living Expenses: Not hard at all  Food Insecurity: No Food Insecurity  . Worried About Charity fundraiser  in the Last Year: Never true  . Ran Out of Food in the Last Year: Never true  Transportation Needs: No Transportation Needs  . Lack of Transportation (Medical): No  . Lack of Transportation (Non-Medical): No  Physical Activity: Inactive  . Days of Exercise per Week: 0 days  . Minutes of Exercise per Session: 0 min  Stress: Stress Concern Present  . Feeling of Stress : Very much  Social Connections:   . Frequency of Communication with Friends and Family:   . Frequency of Social Gatherings with Friends and Family:   . Attends Religious Services:   . Active Member of Clubs or Organizations:   . Attends Archivist Meetings:   [name] Marital Status:   Intimate Partner Violence:   . Fear of Current or Ex-Partner:   . Emotionally Abused:   [name] Physically Abused:   . Sexually Abused:     Family History  Problem Relation Age of Onset  . Cancer Sister   . Cancer - Cervical  Mother   . Heart attack Brother   . Heart attack Maternal Grandfather      Current Outpatient Medications:  .  clonazePAM (KLONOPIN) 1 MG tablet, Take 1 tablet (1 mg total) by mouth 2 (two) times daily as needed for anxiety., Disp: 60 tablet, Rfl: 2 .  COMBIVENT RESPIMAT 20-100 MCG/ACT AERS respimat, INHALE 1 PUFF BY MOUTH INTO THE LUNGS EVERY 6 HOURS AS NEEDED, Disp: 4 g, Rfl: 2 .  dexamethasone (DECADRON) 4 MG tablet, Take 2 tablets by mouth once a day on the day after chemotherapy and then take 2 tablets two times a day for 2 days. Take with food., Disp: 30 tablet, Rfl: 1 .  fluticasone (FLONASE) 50 MCG/ACT nasal spray, 1 spray into each nostril twice a day, Disp: 16 g, Rfl: 2 .  ipratropium-albuterol (DUONEB) 0.5-2.5 (3) MG/3ML SOLN, Take 3 mLs by nebulization every 6 (six) hours as needed., Disp: 360 mL, Rfl: 11 .  lidocaine (XYLOCAINE) 2 % solution, Use as directed 15 mLs in the mouth or throat every 6 (six) hours as needed for mouth pain (for sore throat)., Disp: 100 mL, Rfl: 2 .  lidocaine-prilocaine (EMLA)  cream, Apply to affected area once, Disp: 30 g, Rfl: 3 .  ondansetron (ZOFRAN) 4 MG tablet, Take 1 tablet (4 mg total) by mouth every 8 (eight) hours as needed for nausea or vomiting., Disp: 30 tablet, Rfl: 0 .  ondansetron (ZOFRAN) 8 MG tablet, Take 1 tablet (8 mg total) by mouth 2 (two) times daily as needed. Start on the third day after chemotherapy., Disp: 30 tablet, Rfl: 1 .  oxyCODONE (OXYCONTIN) 10 mg 12 hr tablet, Take 1 tablet (10 mg total) by mouth every 12 (twelve) hours., Disp: 60 tablet, Rfl: 0 .  Oxycodone HCl 10 MG TABS, Take 1 tablet (10 mg total) by mouth every 4 (four) hours as needed., Disp: 90 tablet, Rfl: 0 .  OXYGEN, Inhale 3 L into the lungs daily., Disp: , Rfl:  .  pantoprazole (PROTONIX) 20 MG tablet, Take 1 tablet (20 mg total) by mouth daily., Disp: 30 tablet, Rfl: 2 .  prochlorperazine (COMPAZINE) 10 MG tablet, Take 1 tablet (10 mg total) by mouth every 6 (six) hours as needed (Nausea or vomiting)., Disp: 30 tablet, Rfl: 1 .  STIOLTO RESPIMAT 2.5-2.5 MCG/ACT AERS, Inhale 2 puffs into the lungs 2 (two) times daily as needed (shortness of breath). , Disp: , Rfl:   Physical exam:  Vitals:   04/16/19 0840  BP: (!) 106/56  Pulse: 76  Temp: (!) 96.5 F (35.8 C)  TempSrc: Tympanic  SpO2: (!) 85%  Weight: 100 lb (45.4 kg)  Height: '5\' 6"'  (1.676 m)   Physical Exam Constitutional:      Comments: He is thin and on home O2. Appears in no acute distress  HENT:     Head: Normocephalic and atraumatic.  Eyes:     Pupils: Pupils are equal, round, and reactive to light.  Cardiovascular:     Rate and Rhythm: Normal rate and regular rhythm.     Heart sounds: Normal heart sounds.  Pulmonary:     Effort: Pulmonary effort is normal.     Breath sounds: Normal breath sounds.  Abdominal:     General: Bowel sounds are normal.     Palpations: Abdomen is soft.  Musculoskeletal:     Cervical back: Normal range of motion.  Skin:    General: Skin is warm and dry.  Neurological:  Mental Status: He is alert and oriented to person, place, and time.      CMP Latest Ref Rng & Units 04/16/2019  Glucose 70 - 99 mg/dL 115(H)  BUN 8 - 23 mg/dL 14  Creatinine 0.61 - 1.24 mg/dL 0.60(L)  Sodium 135 - 145 mmol/L 131(L)  Potassium 3.5 - 5.1 mmol/L 4.1  Chloride 98 - 111 mmol/L 95(L)  CO2 22 - 32 mmol/L 30  Calcium 8.9 - 10.3 mg/dL 8.1(L)  Total Protein 6.5 - 8.1 g/dL 6.2(L)  Total Bilirubin 0.3 - 1.2 mg/dL 0.6  Alkaline Phos 38 - 126 U/L 53  AST 15 - 41 U/L 17  ALT 0 - 44 U/L 20   CBC Latest Ref Rng & Units 04/16/2019  WBC 4.0 - 10.5 K/uL 7.6  Hemoglobin 13.0 - 17.0 g/dL 10.2(L)  Hematocrit 39.0 - 52.0 % 31.5(L)  Platelets 150 - 400 K/uL 212     Assessment and plan- Patient is a 69 y.o. male withsquamous cell carcinoma of the supraglottic larynx stage IVA cT4a cN1cM0.We will put on treatment with [cycle 4 of weekly cisplatin chemotherapy  Patient has had 5 pound weight loss in the last 1 to 2 weeks.  He continues to have difficulty swallowing and reports pain during swallowing.  He is also on OxyContin and as needed oxycodone and does not desire any changes to his pain medications yet.   We discussed his inadequate oral intake and the need to supplement it with enteral feeds.  Discussed placement of G-tube at this time.  Patient significant other feels like he needs a G-tube but patient himself remains hesitant.  I also spoke to his cousin Marylyn Ishihara over the phone and explained everything to him as well.  Patient was clean and cachectic even before we started chemoradiation and he has lost further weight.  He has fairly locally advanced laryngeal cancer and he had decided not to proceed with surgery.  Cisplatin remains one of the best radiosensitizing agents available for best possible results with chemoradiation.  His kidney functions remain stable and I am hesitant to switch him from cisplatin to an alternative drug such as cetuximab.  I will consider reducing the dose  of cisplatin to 30 mg per metered square and see if he tolerates it better.  I will hold off on giving him chemotherapy today.  He desires a week break from radiation as well.  I will reassess him next week and consider restarting chemoradiation at that time.  In the meanwhile patient will think about PEG tube placement and get back to [place].  I also got in touch with dietitian Herb Grays about this  I will give him 1 L of IV fluids today along with 10 mg of IV Decadron and 4 mg of IV morphine.  We will also plan for another session of IV fluids later this week.   Total face to face encounter time for this patient visit was 35 min.   Visit Diagnosis 1. Abnormal weight loss   2. Moderate protein-calorie malnutrition ([place])   3. Squamous cell carcinoma of larynx (HCC)   4. Anemia due to antineoplastic chemotherapy   5. Neoplasm related pain      Dr. Randa Evens, MD, MPH [facility] [phone] 04/18/2019 9:59 AM

## 2019-04-19 ENCOUNTER — Inpatient Hospital Stay: Payer: Medicare Other | Attending: Oncology

## 2019-04-19 ENCOUNTER — Other Ambulatory Visit: Payer: Self-pay

## 2019-04-19 ENCOUNTER — Other Ambulatory Visit: Payer: Self-pay | Admitting: *Deleted

## 2019-04-19 ENCOUNTER — Inpatient Hospital Stay: Payer: Medicare Other

## 2019-04-19 ENCOUNTER — Ambulatory Visit: Payer: Medicare Other

## 2019-04-19 VITALS — BP 110/78 | HR 77 | Temp 97.2°F | Resp 18

## 2019-04-19 DIAGNOSIS — G8918 Other acute postprocedural pain: Secondary | ICD-10-CM | POA: Insufficient documentation

## 2019-04-19 DIAGNOSIS — C329 Malignant neoplasm of larynx, unspecified: Secondary | ICD-10-CM

## 2019-04-19 DIAGNOSIS — E860 Dehydration: Secondary | ICD-10-CM

## 2019-04-19 DIAGNOSIS — C321 Malignant neoplasm of supraglottis: Secondary | ICD-10-CM | POA: Insufficient documentation

## 2019-04-19 DIAGNOSIS — Z5111 Encounter for antineoplastic chemotherapy: Secondary | ICD-10-CM | POA: Insufficient documentation

## 2019-04-19 MED ORDER — SODIUM CHLORIDE 0.9 % IV SOLN
INTRAVENOUS | Status: AC
  Filled 2019-04-19 (×2): qty 250

## 2019-04-19 MED ORDER — HEPARIN SOD (PORK) LOCK FLUSH 100 UNIT/ML IV SOLN
500.0000 [IU] | Freq: Once | INTRAVENOUS | Status: AC
  Administered 2019-04-19: 500 [IU] via INTRAVENOUS
  Filled 2019-04-19: qty 5

## 2019-04-19 MED ORDER — HEPARIN SOD (PORK) LOCK FLUSH 100 UNIT/ML IV SOLN
INTRAVENOUS | Status: AC
  Filled 2019-04-19: qty 5

## 2019-04-19 MED ORDER — OSMOLITE 1.5 CAL PO LIQD: ORAL | 3 refills | Status: DC

## 2019-04-19 NOTE — Telephone Encounter (Signed)
Made a appt for pt to get peg tube indsertion

## 2019-04-19 NOTE — Addendum Note (Signed)
Addended by: Jennet Maduro B on: 04/19/2019 03:32 PM   Modules accepted: Orders

## 2019-04-19 NOTE — Progress Notes (Addendum)
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  Patient receiving concurrent chemotherapy and radiation.  Met with patient and girlfriend [name] at their request to discuss PEG tube placement, see sample tube, discuss tube feeding regimen and pros and cons of feeding tube placement.      Estimated Energy Needs  Kcals: 1500-1750 Protein: 75-88 g Fluid: > 1.5 L  NUTRITION DIAGNOSIS: Inadequate oral intake continues   INTERVENTION:  RD spent > 60 minutes discussing feeding tube and reviewing tube feeding regimen with patient and girlfriend.  Patient as able to return demonstration on how to properly administer feeding (water used) on sample PEG tube.   Patient and girlfriend are in agreement and want to pursue PEG placement.  Dr Rao and Sherry, RN informed.  Recommend osmolite 1.5, 5 cartons per day to meet nutritional needs.  Flush with 90 ml of water before and after each feeding. Patient will begin with 1/2 carton QID and add 1/2 carton daily until reaches 5 full cartons per day. Written tube feeding instructions reviewed and given to patient. Tube feeding provides 1775 calories, 74.5 g protein and 1800 ml free water and meets 100% of patient needs.   PEG kit given to patient as well (1 case of osmolite 1.5, syringes, dressing, paper tape) RD to reach out to [employer] representative once date of feeding tube placement known.  Patient is at risk of refeeding syndrome. Recommend checking CMET, Phos, Mag at least q 3 days until patient is at goal rate of tube feeding (5 cartons/day). Patient has contact information for RD.  Contact information was also given to patient for covering RD during the week of 4/2-4/7.      MONITORING, EVALUATION, GOAL: Patient will consume adequate calories and protein to prevent weight loss.    NEXT VISIT: April 12th during infusion    B. , RD, LDN Registered Dietitian [phone]  (pager)     

## 2019-04-19 NOTE — Progress Notes (Signed)
phop

## 2019-04-20 ENCOUNTER — Ambulatory Visit: Payer: Medicare Other

## 2019-04-23 ENCOUNTER — Ambulatory Visit
Admission: RE | Admit: 2019-04-23 | Discharge: 2019-04-23 | Disposition: A | Discharge status: Home / Self Care | Payer: Medicare Other | Source: Ambulatory Visit | Attending: Radiation Oncology | Admitting: Radiation Oncology

## 2019-04-23 ENCOUNTER — Telehealth: Payer: Self-pay

## 2019-04-23 ENCOUNTER — Other Ambulatory Visit: Payer: Self-pay | Admitting: Physician Assistant

## 2019-04-23 ENCOUNTER — Other Ambulatory Visit: Payer: Self-pay

## 2019-04-23 ENCOUNTER — Encounter: Payer: Self-pay | Admitting: Oncology

## 2019-04-23 ENCOUNTER — Inpatient Hospital Stay: Payer: Medicare Other

## 2019-04-23 ENCOUNTER — Inpatient Hospital Stay (HOSPITAL_BASED_OUTPATIENT_CLINIC_OR_DEPARTMENT_OTHER): Payer: Medicare Other | Admitting: Oncology

## 2019-04-23 VITALS — BP 118/68 | HR 75 | Temp 97.8°F | Ht 66.0 in | Wt 101.0 lb

## 2019-04-23 DIAGNOSIS — J029 Acute pharyngitis, unspecified: Secondary | ICD-10-CM | POA: Insufficient documentation

## 2019-04-23 DIAGNOSIS — G4730 Sleep apnea, unspecified: Secondary | ICD-10-CM | POA: Diagnosis not present

## 2019-04-23 DIAGNOSIS — C329 Malignant neoplasm of larynx, unspecified: Secondary | ICD-10-CM

## 2019-04-23 DIAGNOSIS — F17210 Nicotine dependence, cigarettes, uncomplicated: Secondary | ICD-10-CM | POA: Diagnosis not present

## 2019-04-23 DIAGNOSIS — Z85828 Personal history of other malignant neoplasm of skin: Secondary | ICD-10-CM | POA: Diagnosis not present

## 2019-04-23 DIAGNOSIS — C61 Malignant neoplasm of prostate: Secondary | ICD-10-CM | POA: Insufficient documentation

## 2019-04-23 DIAGNOSIS — Z5111 Encounter for antineoplastic chemotherapy: Secondary | ICD-10-CM | POA: Diagnosis not present

## 2019-04-23 DIAGNOSIS — G8918 Other acute postprocedural pain: Secondary | ICD-10-CM | POA: Diagnosis not present

## 2019-04-23 DIAGNOSIS — E538 Deficiency of other specified B group vitamins: Secondary | ICD-10-CM | POA: Diagnosis not present

## 2019-04-23 DIAGNOSIS — Z79899 Other long term (current) drug therapy: Secondary | ICD-10-CM | POA: Insufficient documentation

## 2019-04-23 DIAGNOSIS — R634 Abnormal weight loss: Secondary | ICD-10-CM | POA: Insufficient documentation

## 2019-04-23 DIAGNOSIS — T451X5A Adverse effect of antineoplastic and immunosuppressive drugs, initial encounter: Secondary | ICD-10-CM

## 2019-04-23 DIAGNOSIS — Z85820 Personal history of malignant melanoma of skin: Secondary | ICD-10-CM | POA: Diagnosis not present

## 2019-04-23 DIAGNOSIS — J449 Chronic obstructive pulmonary disease, unspecified: Secondary | ICD-10-CM | POA: Diagnosis not present

## 2019-04-23 DIAGNOSIS — Z95828 Presence of other vascular implants and grafts: Secondary | ICD-10-CM

## 2019-04-23 DIAGNOSIS — F419 Anxiety disorder, unspecified: Secondary | ICD-10-CM | POA: Diagnosis not present

## 2019-04-23 DIAGNOSIS — C321 Malignant neoplasm of supraglottis: Secondary | ICD-10-CM | POA: Diagnosis not present

## 2019-04-23 DIAGNOSIS — D6481 Anemia due to antineoplastic chemotherapy: Secondary | ICD-10-CM

## 2019-04-23 DIAGNOSIS — E860 Dehydration: Secondary | ICD-10-CM

## 2019-04-23 DIAGNOSIS — R1310 Dysphagia, unspecified: Secondary | ICD-10-CM | POA: Insufficient documentation

## 2019-04-23 DIAGNOSIS — G893 Neoplasm related pain (acute) (chronic): Secondary | ICD-10-CM | POA: Diagnosis not present

## 2019-04-23 LAB — COMPREHENSIVE METABOLIC PANEL
ALT: 21 U/L (ref 0–44)
AST: 20 U/L (ref 15–41)
Albumin: 3.6 g/dL (ref 3.5–5.0)
Alkaline Phosphatase: 48 U/L (ref 38–126)
Anion gap: 7 (ref 5–15)
BUN: 11 mg/dL (ref 8–23)
CO2: 32 mmol/L (ref 22–32)
Calcium: 8.5 mg/dL — ABNORMAL LOW (ref 8.9–10.3)
Chloride: 96 mmol/L — ABNORMAL LOW (ref 98–111)
Creatinine, Ser: 0.66 mg/dL (ref 0.61–1.24)
GFR calc Af Amer: >60 mL/min (ref >60)
GFR calc non Af Amer: >60 mL/min (ref >60)
Glucose, Bld: 107 mg/dL — ABNORMAL HIGH (ref 70–99)
Potassium: 4.2 mmol/L (ref 3.5–5.1)
Sodium: 135 mmol/L (ref 135–145)
Total Bilirubin: 0.4 mg/dL (ref 0.3–1.2)
Total Protein: 6 g/dL — ABNORMAL LOW (ref 6.5–8.1)

## 2019-04-23 LAB — CBC WITH DIFFERENTIAL/PLATELET
Abs Immature Granulocytes: 0.01 10*3/uL (ref 0.00–0.07)
Basophils Absolute: 0.1 10*3/uL (ref 0.0–0.1)
Basophils Relative: 1 %
Eosinophils Absolute: 0.1 10*3/uL (ref 0.0–0.5)
Eosinophils Relative: 2 %
HCT: 28.3 % — ABNORMAL LOW (ref 39.0–52.0)
Hemoglobin: 9.3 g/dL — ABNORMAL LOW (ref 13.0–17.0)
Immature Granulocytes: 0 %
Lymphocytes Relative: 15 %
Lymphs Abs: 0.6 10*3/uL — ABNORMAL LOW (ref 0.7–4.0)
MCH: 28.1 pg (ref 26.0–34.0)
MCHC: 32.9 g/dL (ref 30.0–36.0)
MCV: 85.5 fL (ref 80.0–100.0)
Monocytes Absolute: 0.8 10*3/uL (ref 0.1–1.0)
Monocytes Relative: 18 %
Neutro Abs: 2.7 10*3/uL (ref 1.7–7.7)
Neutrophils Relative %: 64 %
Platelets: 195 10*3/uL (ref 150–400)
RBC: 3.31 MIL/uL — ABNORMAL LOW (ref 4.22–5.81)
RDW: 14.7 % (ref 11.5–15.5)
WBC: 4.3 10*3/uL (ref 4.0–10.5)
nRBC: 0 % (ref 0.0–0.2)

## 2019-04-23 LAB — MAGNESIUM: Magnesium: 1.8 mg/dL (ref 1.7–2.4)

## 2019-04-23 LAB — PHOSPHORUS: Phosphorus: 4 mg/dL (ref 2.5–4.6)

## 2019-04-23 MED ORDER — SODIUM CHLORIDE 0.9 % IV SOLN
40.0000 mg/m2 | Freq: Once | INTRAVENOUS | Status: AC
  Administered 2019-04-23: 60 mg via INTRAVENOUS
  Filled 2019-04-23: qty 60

## 2019-04-23 MED ORDER — HEPARIN SOD (PORK) LOCK FLUSH 100 UNIT/ML IV SOLN
INTRAVENOUS | Status: AC
  Filled 2019-04-23: qty 5

## 2019-04-23 MED ORDER — SODIUM CHLORIDE 0.9 % IV SOLN
10.0000 mg | Freq: Once | INTRAVENOUS | Status: AC
  Administered 2019-04-23: 10 mg via INTRAVENOUS
  Filled 2019-04-23: qty 10

## 2019-04-23 MED ORDER — SODIUM CHLORIDE 0.9 % IV SOLN
Freq: Once | INTRAVENOUS | Status: AC
  Filled 2019-04-23: qty 250

## 2019-04-23 MED ORDER — SODIUM CHLORIDE 0.9% FLUSH
10.0000 mL | Freq: Once | INTRAVENOUS | Status: AC
  Administered 2019-04-23: 10 mL via INTRAVENOUS
  Filled 2019-04-23: qty 10

## 2019-04-23 MED ORDER — SODIUM CHLORIDE 0.9 % IV SOLN
150.0000 mg | Freq: Once | INTRAVENOUS | Status: AC
  Administered 2019-04-23: 150 mg via INTRAVENOUS
  Filled 2019-04-23: qty 150

## 2019-04-23 MED ORDER — SODIUM CHLORIDE 0.9 % IV SOLN
Freq: Once | INTRAVENOUS | Status: DC
  Filled 2019-04-23: qty 250

## 2019-04-23 MED ORDER — CYANOCOBALAMIN 1000 MCG/ML IJ SOLN
1000.0000 ug | Freq: Once | INTRAMUSCULAR | Status: AC
  Administered 2019-04-23: 1000 ug via INTRAMUSCULAR
  Filled 2019-04-23: qty 1

## 2019-04-23 MED ORDER — POTASSIUM CHLORIDE 2 MEQ/ML IV SOLN
Freq: Once | INTRAVENOUS | Status: AC
  Filled 2019-04-23: qty 1000

## 2019-04-23 MED ORDER — PALONOSETRON HCL INJECTION 0.25 MG/5ML
0.2500 mg | Freq: Once | INTRAVENOUS | Status: AC
  Administered 2019-04-23: 0.25 mg via INTRAVENOUS
  Filled 2019-04-23: qty 5

## 2019-04-23 MED ORDER — HEPARIN SOD (PORK) LOCK FLUSH 100 UNIT/ML IV SOLN
500.0000 [IU] | Freq: Once | INTRAVENOUS | Status: AC | PRN
  Administered 2019-04-23: 500 [IU]
  Filled 2019-04-23: qty 5

## 2019-04-23 NOTE — Progress Notes (Signed)
Patient for G-Tube placement on 04/26/2019, to be here @ 0800, NPO after MN prior to procedure. Stated awareness.

## 2019-04-23 NOTE — Telephone Encounter (Signed)
Patient was contacted to let him know that he is needing to go to [facility] 8:00[facility] to have his COVID-19 test so he could have his G-tube placed two days later. Patient understood and stated that he would go. Order for COVID-19 was placed.

## 2019-04-23 NOTE — Progress Notes (Signed)
Patient stated that he had been doing well over all.

## 2019-04-23 NOTE — Progress Notes (Signed)
Pharmacist Chemotherapy Monitoring - Follow Up Assessment    I verify that I have reviewed each item in the below checklist:  . Regimen for the patient is scheduled for the appropriate day and plan matches scheduled date. [name] Appropriate non-routine labs are ordered dependent on drug ordered. . If applicable, additional medications reviewed and ordered per protocol based on lifetime cumulative doses and/or treatment regimen.   Plan for follow-up and/or issues identified: No . I-vent associated with next due treatment: No . MD and/or nursing notified: No  Kratos Ruscitti K 04/23/2019 11:30 AM

## 2019-04-24 ENCOUNTER — Other Ambulatory Visit
Admission: RE | Admit: 2019-04-24 | Discharge: 2019-04-24 | Disposition: A | Discharge status: Home / Self Care | Payer: Medicare Other | Source: Ambulatory Visit | Attending: Oncology | Admitting: Oncology

## 2019-04-24 ENCOUNTER — Ambulatory Visit
Admission: RE | Admit: 2019-04-24 | Discharge: 2019-04-24 | Disposition: A | Discharge status: Home / Self Care | Payer: Medicare Other | Source: Ambulatory Visit | Attending: Radiation Oncology | Admitting: Radiation Oncology

## 2019-04-24 DIAGNOSIS — F419 Anxiety disorder, unspecified: Secondary | ICD-10-CM | POA: Diagnosis not present

## 2019-04-24 DIAGNOSIS — R634 Abnormal weight loss: Secondary | ICD-10-CM | POA: Diagnosis not present

## 2019-04-24 DIAGNOSIS — Z20822 Contact with and (suspected) exposure to covid-19: Secondary | ICD-10-CM | POA: Diagnosis not present

## 2019-04-24 DIAGNOSIS — Z01812 Encounter for preprocedural laboratory examination: Secondary | ICD-10-CM | POA: Insufficient documentation

## 2019-04-24 DIAGNOSIS — J029 Acute pharyngitis, unspecified: Secondary | ICD-10-CM | POA: Diagnosis not present

## 2019-04-24 DIAGNOSIS — R1310 Dysphagia, unspecified: Secondary | ICD-10-CM | POA: Diagnosis not present

## 2019-04-24 DIAGNOSIS — C61 Malignant neoplasm of prostate: Secondary | ICD-10-CM | POA: Diagnosis not present

## 2019-04-24 DIAGNOSIS — C329 Malignant neoplasm of larynx, unspecified: Secondary | ICD-10-CM | POA: Diagnosis not present

## 2019-04-24 LAB — SARS CORONAVIRUS 2 (TAT 6-24 HRS): SARS Coronavirus 2: NEGATIVE

## 2019-04-25 ENCOUNTER — Ambulatory Visit
Admission: RE | Admit: 2019-04-25 | Discharge: 2019-04-25 | Disposition: A | Discharge status: Home / Self Care | Payer: Medicare Other | Source: Ambulatory Visit | Attending: Radiation Oncology | Admitting: Radiation Oncology

## 2019-04-25 ENCOUNTER — Telehealth: Payer: Self-pay | Admitting: Nutrition

## 2019-04-25 ENCOUNTER — Other Ambulatory Visit: Payer: Self-pay | Admitting: Student

## 2019-04-25 ENCOUNTER — Other Ambulatory Visit: Payer: Self-pay | Admitting: Radiology

## 2019-04-25 ENCOUNTER — Telehealth: Payer: Self-pay

## 2019-04-25 DIAGNOSIS — J029 Acute pharyngitis, unspecified: Secondary | ICD-10-CM | POA: Diagnosis not present

## 2019-04-25 DIAGNOSIS — R634 Abnormal weight loss: Secondary | ICD-10-CM | POA: Diagnosis not present

## 2019-04-25 DIAGNOSIS — C61 Malignant neoplasm of prostate: Secondary | ICD-10-CM | POA: Diagnosis not present

## 2019-04-25 DIAGNOSIS — R1310 Dysphagia, unspecified: Secondary | ICD-10-CM | POA: Diagnosis not present

## 2019-04-25 DIAGNOSIS — C329 Malignant neoplasm of larynx, unspecified: Secondary | ICD-10-CM | POA: Diagnosis not present

## 2019-04-25 DIAGNOSIS — F419 Anxiety disorder, unspecified: Secondary | ICD-10-CM | POA: Diagnosis not present

## 2019-04-25 NOTE — Telephone Encounter (Signed)
Patient returned RD phone call.  He reports that he has gained 6 pounds since he took 1 week off from chemotherapy.  He is getting ready to eat some soup.  Patient is scheduled to get feeding tube placement tomorrow.  He feels confident in infusing Osmolite 1.5.  He has been given directions written on tube feeding administration.  Reminded patient he would follow-up with RD on Monday.

## 2019-04-25 NOTE — Telephone Encounter (Signed)
I attempted to contact patient by phone 2 times to follow-up on tube feeding education.  Patient was not available.  I could not leave a message because his voicemail box is not set up.  Patient is scheduled to see RD on Monday, April 12.

## 2019-04-26 ENCOUNTER — Ambulatory Visit: Payer: Medicare Other

## 2019-04-26 ENCOUNTER — Other Ambulatory Visit: Payer: Self-pay

## 2019-04-26 ENCOUNTER — Telehealth: Payer: Self-pay

## 2019-04-26 ENCOUNTER — Ambulatory Visit
Admission: RE | Admit: 2019-04-26 | Discharge: 2019-04-26 | Disposition: A | Discharge status: Home / Self Care | Payer: Medicare Other | Source: Ambulatory Visit | Attending: Oncology | Admitting: Oncology

## 2019-04-26 ENCOUNTER — Telehealth: Payer: Self-pay | Admitting: *Deleted

## 2019-04-26 DIAGNOSIS — Z8249 Family history of ischemic heart disease and other diseases of the circulatory system: Secondary | ICD-10-CM | POA: Diagnosis not present

## 2019-04-26 DIAGNOSIS — Z9981 Dependence on supplemental oxygen: Secondary | ICD-10-CM | POA: Diagnosis not present

## 2019-04-26 DIAGNOSIS — F17210 Nicotine dependence, cigarettes, uncomplicated: Secondary | ICD-10-CM | POA: Diagnosis not present

## 2019-04-26 DIAGNOSIS — Z79899 Other long term (current) drug therapy: Secondary | ICD-10-CM | POA: Insufficient documentation

## 2019-04-26 DIAGNOSIS — J439 Emphysema, unspecified: Secondary | ICD-10-CM | POA: Insufficient documentation

## 2019-04-26 DIAGNOSIS — I252 Old myocardial infarction: Secondary | ICD-10-CM | POA: Insufficient documentation

## 2019-04-26 DIAGNOSIS — C329 Malignant neoplasm of larynx, unspecified: Secondary | ICD-10-CM

## 2019-04-26 DIAGNOSIS — Z8619 Personal history of other infectious and parasitic diseases: Secondary | ICD-10-CM | POA: Diagnosis not present

## 2019-04-26 DIAGNOSIS — Z8049 Family history of malignant neoplasm of other genital organs: Secondary | ICD-10-CM | POA: Insufficient documentation

## 2019-04-26 DIAGNOSIS — Z809 Family history of malignant neoplasm, unspecified: Secondary | ICD-10-CM | POA: Diagnosis not present

## 2019-04-26 DIAGNOSIS — G4730 Sleep apnea, unspecified: Secondary | ICD-10-CM | POA: Insufficient documentation

## 2019-04-26 DIAGNOSIS — Z85820 Personal history of malignant melanoma of skin: Secondary | ICD-10-CM | POA: Insufficient documentation

## 2019-04-26 DIAGNOSIS — F419 Anxiety disorder, unspecified: Secondary | ICD-10-CM | POA: Insufficient documentation

## 2019-04-26 DIAGNOSIS — Z8546 Personal history of malignant neoplasm of prostate: Secondary | ICD-10-CM | POA: Insufficient documentation

## 2019-04-26 HISTORY — PX: IR GASTROSTOMY TUBE MOD SED: IMG625

## 2019-04-26 MED ORDER — IODIXANOL 320 MG/ML IV SOLN
50.0000 mL | Freq: Once | INTRAVENOUS | Status: AC | PRN
  Administered 2019-04-26: 10 mL

## 2019-04-26 MED ORDER — CEFAZOLIN SODIUM-DEXTROSE 1-4 GM/50ML-% IV SOLN
INTRAVENOUS | Status: AC | PRN
  Administered 2019-04-26: 2 g via INTRAVENOUS

## 2019-04-26 MED ORDER — MIDAZOLAM HCL 2 MG/2ML IJ SOLN
INTRAMUSCULAR | Status: AC | PRN
  Administered 2019-04-26 (×3): 1 mg via INTRAVENOUS

## 2019-04-26 MED ORDER — FENTANYL CITRATE (PF) 100 MCG/2ML IJ SOLN
INTRAMUSCULAR | Status: AC | PRN
  Administered 2019-04-26 (×2): 50 ug via INTRAVENOUS

## 2019-04-26 MED ORDER — SODIUM CHLORIDE 0.9 % IV SOLN: INTRAVENOUS | Status: DC

## 2019-04-26 MED ORDER — CEFAZOLIN SODIUM-DEXTROSE 2-4 GM/100ML-% IV SOLN: 2.0000 g | Freq: Once | INTRAVENOUS | Status: DC

## 2019-04-26 MED ORDER — GLUCAGON HCL (RDNA) 1 MG IJ SOLR
INTRAMUSCULAR | Status: AC | PRN
  Administered 2019-04-26: .5 mg via INTRAVENOUS

## 2019-04-26 MED ORDER — HYDROCODONE-ACETAMINOPHEN 5-325 MG PO TABS: 1.0000 | ORAL_TABLET | ORAL | Status: DC | PRN

## 2019-04-26 NOTE — Procedures (Signed)
Interventional Radiology Procedure Note  Procedure: fluoro pull thru 20 fr gtube  Complications: None  Estimated Blood Loss: min  Findings: Confirmed in the stomach

## 2019-04-26 NOTE — Progress Notes (Signed)
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 Richardson Landry with symptoms of difficulty swallowing and persistent sore throat which has been ongoing for the last 1 year.NPL exam showed afriable mass involving the false vocal cord but did not involve the true vocal cords. True vocal cords remain mobile on exam. Hypopharynx and tongue base was clear.   This was followed by CT soft tissue neck which showed a 3.3 x 1.7 x 3.5 cm mass which was thought to be involving both the true and false vocal cords and extending anteriorly to involve the anterior commissure. Mass extends to involve the paraglottic space and erodes through the thyroid cartilage anteriorly and left of midline. There is also invasion of the preepiglottic space inferiorly. Enlarged cystic necrotic level 3/4 lymph node measuring 1.6 x 1.2 cm. Findings are consistent with nodal metastatic disease. Biopsy of the mass showed squamous cell carcinoma.  PET/CT showed hyppermetabolic mass extending from left glottis to anterior commisure. Solitary left level 2 metastatic Lymph node. No distant metastatic disease  Patient met with [facility] and decided against total laryngectomy. Plan is for concurrent  chemo/RT +/-adjuvantchemotherapy following chemo/RT   Interval history-patient will be going for a PEG tube insertion on 04/27/2019 he is currently taking both OxyContin and oxycodone for this pain trying to use it conservatively.  Reports fatigue..  ECOG PS- 2 Pain scale- 0  Review of systems- Review of Systems  Constitutional: Positive for malaise/fatigue. Negative for chills, fever and weight loss.  HENT: Negative for congestion, ear discharge and nosebleeds.   Eyes: Negative for blurred vision.  Respiratory: Negative for cough, hemoptysis, sputum production, shortness of breath and wheezing.   Cardiovascular: Negative for chest pain, palpitations, orthopnea and claudication.  Gastrointestinal: Negative for abdominal pain, blood in stool, constipation, diarrhea, heartburn, melena, nausea and vomiting.       Difficulty swallowing  Genitourinary: Negative for dysuria, flank pain, frequency, hematuria and urgency.  Musculoskeletal: Negative for back pain, joint pain and myalgias.  Skin: Negative for rash.  Neurological: Negative for dizziness, tingling, focal weakness, seizures, weakness and headaches.  Endo/Heme/Allergies: Does not bruise/bleed easily.  Psychiatric/Behavioral: Negative for depression and suicidal ideas. The patient does not have insomnia.       No Known Allergies   Past Medical History:  Diagnosis Date  . Anxiety   . Basal cell carcinoma   . Cigarette smoker   . Dental decay   . Dysphagia   . Dyspnea   . End stage COPD ([place])    end stage copd, emphysema  . Erectile dysfunction   . Headache    every morning  . Hepatitis C    treated for 8 weeks this past year with Mayvret  . History of nonmelanoma skin cancer   . Hoarseness, chronic   . Myocardial infarction [facility]) march/april 2020  mild heart attack  . Prostate cancer ([place])   . Respiratory failure, acute ([place])    was inpt for 5 days. thought he was going to die.  . Sleep apnea   . Squamous cell  skin cancer   . Throat cancer ([place])   . Use of leuprolide acetate (Lupron)    treated for prostate cancer.   . 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     Past Surgical History:  Procedure Laterality Date  . FACIAL LACERATION REPAIR    . HERNIA REPAIR Left 1992   inguinal  . MICROLARYNGOSCOPY N/A 01/17/2019   Procedure: MICROLARYNGOSCOPY WITH BX;  Surgeon: Clyde Canterbury, MD;  Location: [facility] ORS;  Service: ENT;  Laterality: N/A;  . PORTA CATH INSERTION N/A 03/15/2019   Procedure: PORTA CATH INSERTION;  Surgeon: Algernon Huxley, MD;  Location: [place] [facility];  Service: Cardiovascular;  Laterality: N/A;  . PROSTATE BIOPSY    . SKIN CANCER EXCISION    . TRIGGER FINGER RELEASE Left 10/2018    Social History   Socioeconomic History  . Marital status: Significant Other    Spouse name: [name], girlfriend  . Number of children: Not on file  . Years of education: Not on file  . Highest education level: Not on file  Occupational History  . Occupation: Chief Strategy Officer    Comment: retired  Tobacco Use  . Smoking status: Current Every Day Smoker    Years: 52.00    Types: Cigarettes  . Smokeless tobacco: Never Used  . Tobacco comment: 5 cigarettes a day  Substance and Sexual Activity  . Alcohol use: Yes    Alcohol/week: 8.0 standard drinks    Types: 8 Cans of beer per week  . Drug use: Never  . Sexual activity: Yes  Other Topics Concern  . Not on file  Social History Narrative   Patient has retired d/t respiratory status. Currently lives with girlfriend    Social Determinants of Health   Financial Resource Strain: Low Risk   . Difficulty of Paying Living Expenses: Not hard at all  Food Insecurity: No Food Insecurity  . Worried About Charity fundraiser in the Last Year: Never true  . Ran Out of Food in the Last Year: Never true  Transportation Needs: No Transportation Needs  . Lack of Transportation (Medical): No  . Lack of Transportation (Non-Medical): No    Physical Activity: Inactive  . Days of Exercise per Week: 0 days  . Minutes of Exercise per Session: 0 min  Stress: Stress Concern Present  . Feeling of Stress : Very much  Social Connections:   . Frequency of Communication with Friends and Family:   . Frequency of Social Gatherings with Friends and Family:   . Attends Religious Services:   . Active Member of Clubs or Organizations:   . Attends Archivist Meetings:   [name] Marital Status:   Intimate Partner Violence:   . Fear of Current or Ex-Partner:   . Emotionally Abused:   [name] Physically Abused:   . Sexually Abused:     Family History  Problem Relation Age of Onset  . Cancer Sister   . Cancer - Cervical Mother   . Heart attack Brother   . Heart attack Maternal Grandfather      Current Outpatient Medications:  .  clonazePAM (KLONOPIN) 1 MG tablet, Take 1 tablet (1 mg total) by mouth 2 (two) times daily as needed for anxiety., Disp: 60  tablet, Rfl: 2 .  COMBIVENT RESPIMAT 20-100 MCG/ACT AERS respimat, INHALE 1 PUFF BY MOUTH INTO THE LUNGS EVERY 6 HOURS AS NEEDED, Disp: 4 g, Rfl: 0 .  dexamethasone (DECADRON) 4 MG tablet, Take 2 tablets by mouth once a day on the day after chemotherapy and then take 2 tablets two times a day for 2 days. Take with food., Disp: 30 tablet, Rfl: 1 .  fluticasone (FLONASE) 50 MCG/ACT nasal spray, SHAKE LIQUID AND USE 1 SPRAY IN EACH NOSTRIL TWICE DAILY, Disp: 16 g, Rfl: 1 .  ipratropium-albuterol (DUONEB) 0.5-2.5 (3) MG/3ML SOLN, Take 3 mLs by nebulization every 6 (six) hours as needed., Disp: 360 mL, Rfl: 11 .  lidocaine (XYLOCAINE) 2 % solution, Use as directed 15 mLs in the mouth or throat every 6 (six) hours as needed for mouth pain (for sore throat)., Disp: 100 mL, Rfl: 2 .  lidocaine-prilocaine (EMLA) cream, Apply to affected area once, Disp: 30 g, Rfl: 3 .  Nutritional Supplements (FEEDING SUPPLEMENT, OSMOLITE 1.5 CAL,) LIQD, Give 5 cartons per day via feeding tube (1 1/2 cartons at 8am  and noon and 1 carton at 4pm and 8pm).  Flush with 50m of water before and after each feeding.  Provides 1775 calories, 74.5 g protein and 18030mfree water and meets 100% of nutritional needs.  Send bolus supplies., Disp: 1185 mL, Rfl: 3 .  ondansetron (ZOFRAN) 4 MG tablet, Take 1 tablet (4 mg total) by mouth every 8 (eight) hours as needed for nausea or vomiting., Disp: 30 tablet, Rfl: 0 .  ondansetron (ZOFRAN) 8 MG tablet, Take 1 tablet (8 mg total) by mouth 2 (two) times daily as needed. Start on the third day after chemotherapy., Disp: 30 tablet, Rfl: 1 .  oxyCODONE (OXYCONTIN) 10 mg 12 hr tablet, Take 1 tablet (10 mg total) by mouth every 12 (twelve) hours., Disp: 60 tablet, Rfl: 0 .  Oxycodone HCl 10 MG TABS, Take 1 tablet (10 mg total) by mouth every 4 (four) hours as needed., Disp: 90 tablet, Rfl: 0 .  OXYGEN, Inhale 3 L into the lungs daily., Disp: , Rfl:  .  pantoprazole (PROTONIX) 20 MG tablet, Take 1 tablet (20 mg total) by mouth daily., Disp: 30 tablet, Rfl: 2 .  prochlorperazine (COMPAZINE) 10 MG tablet, Take 1 tablet (10 mg total) by mouth every 6 (six) hours as needed (Nausea or vomiting)., Disp: 30 tablet, Rfl: 1 .  STIOLTO RESPIMAT 2.5-2.5 MCG/ACT AERS, Inhale 2 puffs into the lungs 2 (two) times daily as needed (shortness of breath). , Disp: , Rfl:   Physical exam:  Vitals:   04/23/19 0903  BP: 118/68  Pulse: 75  Temp: 97.8 F (36.6 C)  TempSrc: Tympanic  SpO2: 90%  Weight: 101 lb (45.8 kg)  Height: '5\' 6"'  (1.676 m)   Physical Exam Constitutional:      Comments: Patient Cachectic.  Appears in no acute distress.  He is on home oxygen  HENT:     Head: Normocephalic and atraumatic.  Eyes:     Pupils: Pupils are equal, round, and reactive to light.  Cardiovascular:     Rate and Rhythm: Normal rate and regular rhythm.     Heart sounds: Normal heart sounds.  Pulmonary:     Effort: Pulmonary effort is normal.     Breath sounds: Normal breath sounds.  Abdominal:      General: Bowel sounds are normal.     Palpations: Abdomen is soft.  Musculoskeletal:  Cervical back: Normal range of motion.  Skin:    General: Skin is warm and dry.  Neurological:     Mental Status: He is alert and oriented to person, place, and time.      CMP Latest Ref Rng & Units 04/23/2019  Glucose 70 - 99 mg/dL 107(H)  BUN 8 - 23 mg/dL 11  Creatinine 0.61 - 1.24 mg/dL 0.66  Sodium 135 - 145 mmol/L 135  Potassium 3.5 - 5.1 mmol/L 4.2  Chloride 98 - 111 mmol/L 96(L)  CO2 22 - 32 mmol/L 32  Calcium 8.9 - 10.3 mg/dL 8.5(L)  Total Protein 6.5 - 8.1 g/dL 6.0(L)  Total Bilirubin 0.3 - 1.2 mg/dL 0.4  Alkaline Phos 38 - 126 U/L 48  AST 15 - 41 U/L 20  ALT 0 - 44 U/L 21   CBC Latest Ref Rng & Units 04/23/2019  WBC 4.0 - 10.5 K/uL 4.3  Hemoglobin 13.0 - 17.0 g/dL 9.3(L)  Hematocrit 39.0 - 52.0 % 28.3(L)  Platelets 150 - 400 K/uL 195     Assessment and plan- Patient is a 69 y.o. male withsquamous cell carcinoma of the supraglottic larynx stage IVA cT4a cN1cM0. He is here for on treatment assessment prior to cycle 4 of weekly cisplatin chemotherapy  Counts are okay to proceed with cisplatin cycle 4 chemotherapy today.  He does have a normocytic anemia with a hemoglobin of 9.3.  Iron studis and folate are normal.  B12 levels are borderline low at 263.  Plan to give him B12 shot today.  Patient will be going for PEG tube placement later this week.  He is also being followed by nutrition will get instructions about starting PEG tube site then.  We will check weekly labs given risk of refeeding syndrome.  Neoplasm related pain: Continue as needed oxycodone and OxyContin  I will see him back in 1 week's time for cycle 5 of cisplatin chemotherapy  We will plan to give him 1 L of IV fluids on 04/27/2019.   Visit Diagnosis 1. Squamous cell carcinoma of larynx (HCC)   2. Encounter for antineoplastic chemotherapy   3. Abnormal weight loss   4. Neoplasm related pain   5. B12  deficiency   6. Anemia due to antineoplastic chemotherapy      Dr. Randa Evens, MD, MPH [facility] [phone] 04/26/2019 4:30 AM

## 2019-04-26 NOTE — Telephone Encounter (Addendum)
[name] called reporting that patient is in extreme pain and she is asking if this is normal. I could hear him moaning in the background. What can be done? Please advise

## 2019-04-26 NOTE — H&P (Signed)
Chief Complaint:   Larynx cancer, dysphaiga   Referring Physician(s): Rao,Archana C    History of Present Illness: Kevin Nelson is a 69 y.o. male with larynx cancer, undergoing chemo and XRT, with wt loss and dysphagia. Overall stable.  Here for fluoro Gtube today.  Past Medical History:  Diagnosis Date  . Anxiety   . Basal cell carcinoma   . Cigarette smoker   . Dental decay   . Dysphagia   . Dyspnea   . End stage COPD ([place])    end stage copd, emphysema  . Erectile dysfunction   . Headache    every morning  . Hepatitis C    treated for 8 weeks this past year with Mayvret  . History of nonmelanoma skin cancer   . Hoarseness, chronic   . Myocardial infarction [facility]) march/april 2020   mild heart attack  . Prostate cancer ([place])   . Respiratory failure, acute ([place])    was inpt for 5 days. thought he was going to die.  . Sleep apnea   . Squamous cell skin cancer   . Throat cancer ([place])   . Use of leuprolide acetate (Lupron)    treated for prostate cancer.   . 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    Past Surgical History:  Procedure Laterality Date  . FACIAL LACERATION REPAIR    . HERNIA REPAIR Left 1992   inguinal  . MICROLARYNGOSCOPY N/A 01/17/2019   Procedure: MICROLARYNGOSCOPY WITH BX;  Surgeon: Clyde Canterbury, MD;  Location: [facility] ORS;  Service: ENT;  Laterality: N/A;  . PORTA CATH INSERTION N/A 03/15/2019   Procedure: PORTA CATH INSERTION;  Surgeon: Algernon Huxley, MD;  Location: [place] [facility];  Service: Cardiovascular;  Laterality: N/A;  . PROSTATE BIOPSY    . SKIN CANCER EXCISION    . TRIGGER FINGER RELEASE Left 10/2018    Allergies: Patient has no known allergies.  Medications: Prior to Admission medications   Medication Sig Start Date End Date Taking? Authorizing Provider  clonazePAM (KLONOPIN) 1 MG tablet Take 1 tablet (1 mg total) by mouth 2 (two) times daily as needed for anxiety. 02/15/19  Yes Cannady, [name] T, NP    COMBIVENT RESPIMAT 20-100 MCG/ACT AERS respimat INHALE 1 PUFF BY MOUTH INTO THE LUNGS EVERY 6 HOURS AS NEEDED 04/18/19  Yes Tyler Pita, MD  dexamethasone (DECADRON) 4 MG tablet Take 2 tablets by mouth once a day on the day after chemotherapy and then take 2 tablets two times a day for 2 days. Take with food. 03/11/19  Yes Sindy Guadeloupe, MD  fluticasone (FLONASE) 50 MCG/ACT nasal spray SHAKE LIQUID AND USE 1 SPRAY IN EACH NOSTRIL TWICE DAILY 04/18/19  Yes Tyler Pita, MD  ipratropium-albuterol (DUONEB) 0.5-2.5 (3) MG/3ML SOLN Take 3 mLs by nebulization every 6 (six) hours as needed. 02/19/19  Yes Cannady, [name] T, NP  lidocaine (XYLOCAINE) 2 % solution Use as directed 15 mLs in the mouth or throat every 6 (six) hours as needed for mouth pain (for sore throat). 01/04/19  Yes Marnee Guarneri T, NP  lidocaine-prilocaine (EMLA) cream Apply to affected area once 03/14/19  Yes Sindy Guadeloupe, MD  Nutritional Supplements (FEEDING SUPPLEMENT, OSMOLITE 1.5 CAL,) LIQD Give 5 cartons per day via feeding tube (1 1/2 cartons at 8am and noon and 1 carton at 4pm and 8pm).  Flush with 79ml of water before and after each feeding.  Provides [phone]  calories, 74.5 g protein and 1828ml free water and meets 100% of nutritional needs.  Send bolus supplies. 04/19/19  Yes Sindy Guadeloupe, MD  ondansetron (ZOFRAN) 4 MG tablet Take 1 tablet (4 mg total) by mouth every 8 (eight) hours as needed for nausea or vomiting. 01/23/19  Yes Sindy Guadeloupe, MD  ondansetron (ZOFRAN) 8 MG tablet Take 1 tablet (8 mg total) by mouth 2 (two) times daily as needed. Start on the third day after chemotherapy. 03/11/19  Yes Sindy Guadeloupe, MD  oxyCODONE (OXYCONTIN) 10 mg 12 hr tablet Take 1 tablet (10 mg total) by mouth every 12 (twelve) hours. 04/06/19  Yes Sindy Guadeloupe, MD  Oxycodone HCl 10 MG TABS Take 1 tablet (10 mg total) by mouth every 4 (four) hours as needed. 04/12/19 05/12/19 Yes Sindy Guadeloupe, MD  OXYGEN Inhale 3 L into the lungs daily.    Yes [provider]  pantoprazole (PROTONIX) 20 MG tablet Take 1 tablet (20 mg total) by mouth daily. 04/02/19  Yes Sindy Guadeloupe, MD  prochlorperazine (COMPAZINE) 10 MG tablet Take 1 tablet (10 mg total) by mouth every 6 (six) hours as needed (Nausea or vomiting). 03/11/19  Yes Sindy Guadeloupe, MD  STIOLTO RESPIMAT 2.5-2.5 MCG/ACT AERS Inhale 2 puffs into the lungs 2 (two) times daily as needed (shortness of breath).  11/16/18  Yes [provider]     Family History  Problem Relation Age of Onset  . Cancer Sister   . Cancer - Cervical Mother   . Heart attack Brother   . Heart attack Maternal Grandfather     Social History   Socioeconomic History  . Marital status: Significant Other    Spouse name: [name], girlfriend  . Number of children: Not on file  . Years of education: Not on file  . Highest education level: Not on file  Occupational History  . Occupation: Chief Strategy Officer    Comment: retired  Tobacco Use  . Smoking status: Current Every Day Smoker    Years: 52.00    Types: Cigarettes  . Smokeless tobacco: Never Used  . Tobacco comment: 5 cigarettes a day  Substance and Sexual Activity  . Alcohol use: Yes    Alcohol/week: 8.0 standard drinks    Types: 8 Cans of beer per week  . Drug use: Never  . Sexual activity: Yes  Other Topics Concern  . Not on file  Social History Narrative   Patient has retired d/t respiratory status. Currently lives with girlfriend    Social Determinants of Health   Financial Resource Strain: Low Risk   . Difficulty of Paying Living Expenses: Not hard at all  Food Insecurity: No Food Insecurity  . Worried About Charity fundraiser in the Last Year: Never true  . Ran Out of Food in the Last Year: Never true  Transportation Needs: No Transportation Needs  . Lack of Transportation (Medical): No  . Lack of Transportation (Non-Medical): No  Physical Activity: Inactive  . Days of Exercise per Week: 0 days  . Minutes of Exercise  per Session: 0 min  Stress: Stress Concern Present  . Feeling of Stress : Very much  Social Connections:   . Frequency of Communication with Friends and Family:   . Frequency of Social Gatherings with Friends and Family:   . Attends Religious Services:   . Active Member of Clubs or Organizations:   . Attends Archivist Meetings:   [name] Marital Status:  ECOG Status: 2 - Symptomatic, <50% confined to bed  Review of Systems: A 12 point ROS discussed and pertinent positives are indicated in the HPI above.  All other systems are negative.  Review of Systems  Vital Signs: BP (!) 117/55   Pulse 77   Temp 98.2 F (36.8 C) (Oral)   Resp 20   Ht 5\' 6"  (1.676 m)   Wt 48.1 kg   SpO2 100%   BMI 17.11 kg/m   Physical Exam Constitutional:      Comments: Frail thin male NAD  Eyes:     General: No scleral icterus.    Conjunctiva/sclera: Conjunctivae normal.  Cardiovascular:     Rate and Rhythm: Normal rate and regular rhythm.     Heart sounds: No murmur.  Pulmonary:     Effort: Pulmonary effort is normal.     Breath sounds: Normal breath sounds.  Abdominal:     General: Abdomen is flat. Bowel sounds are normal. There is no distension.  Neurological:     Mental Status: Mental status is at baseline.  Psychiatric:        Mood and Affect: Mood normal.     Imaging: No results found.  Labs:  CBC: Recent Labs    04/02/19 0834 04/09/19 0902 04/16/19 0814 04/23/19 0820  WBC 7.3 7.5 7.6 4.3  HGB 10.3* 10.4* 10.2* 9.3*  HCT 32.6* 33.0* 31.5* 28.3*  PLT 181 195 212 195    COAGS: No results for input(s): INR, APTT in the last 8760 hours.  BMP: Recent Labs    04/02/19 0834 04/09/19 0902 04/16/19 0814 04/23/19 0820  NA 133* 133* 131* 135  K 4.0 4.2 4.1 4.2  CL 97* 93* 95* 96*  CO2 31 32 30 32  GLUCOSE 121* 150* 115* 107*  BUN 13 14 14 11   CALCIUM 7.8* 8.1* 8.1* 8.5*  CREATININE 0.56* 0.49* 0.60* 0.66  GFRNONAA >60 >60 >60 >60  GFRAA >60 >60 >60 >60      LIVER FUNCTION TESTS: Recent Labs    10/24/18 1147 04/09/19 0902 04/16/19 0814 04/23/19 0820  BILITOT 0.4 0.7 0.6 0.4  AST 31 15 17 20   ALT 16 17 20 21   ALKPHOS 75 47 53 48  PROT 6.7 5.7* 6.2* 6.0*  ALBUMIN 4.4 3.6 3.6 3.6    TUMOR MARKERS: No results for input(s): AFPTM, CEA, CA199, CHROMGRNA in the last 8760 hours.  Assessment and Plan:  Larynx ca, wt loss and dysphagia on chemXRT.  Plan for fluoro gtube today.  Risks and benefits image guided gastrostomy tube placement was discussed with the patient including, but not limited to the need for a barium enema during the procedure, bleeding, infection, peritonitis and/or damage to adjacent structures.  All of the patient's questions were answered, patient is agreeable to proceed.  Consent signed and in chart.    Thank you for this interesting consult.  I greatly enjoyed meeting Hawley Luby and look forward to participating in their care.  A copy of this report was sent to the requesting provider on this date.  Electronically Signed: Greggory Keen, MD 04/26/2019, 9:08 AM   I spent a total of  30 Minutes   in face to face in clinical consultation, greater than 50% of which was counseling/coordinating care for this patient .

## 2019-04-26 NOTE — Telephone Encounter (Signed)
Sister [name] called reporting that patient had his PEG tube placed today and that she needs to know if it is to be flushed today. She also reports that patient is having bilateral ankle swelling throughout the day and is asking if something needs to be done about it. Please return her call

## 2019-04-26 NOTE — Discharge Instructions (Signed)
PEG Tube Home Guide A PEG tube is used to put food and fluids into the stomach. Before you leave the hospital, make sure that you know:  How to care for your PEG tube.  How to care for the opening (stoma) in your belly.  How to give yourself a feeding.  How to give yourself medicines.  When to call your doctor for help. Supplies needed:  Soapy water.  Clean, plain water.  Clean washcloth.  Bandage (dressing). This is optional.  Syringe. How to care for a PEG tube Check your PEG tube every day. Make sure:  It is not too tight.  It is in the correct place. There is a mark on the tube that shows when the tube is in the correct place. Adjust the tube if you need to. Cleaning your stoma Clean your stoma every day. Follow these steps: 1. Wash your hands with soap and water. If soap and water are not available, use hand sanitizer. 2. Check your stoma. Let your doctor know if there is: ? Redness. ? Leaking. ? Skin irritation. 3. Wash the stoma gently with warm, soapy water. 4. Rinse the stoma with plain water. 5. Pat the stoma area dry. 6. You may place a bandage around the opening of your stoma as told by your doctor.  Giving a feeding Your doctor will tell you:  How much nutrition and fluid you will need for each feeding.  How often to have a feeding.  Whether you should take medicine in the tube by itself or with a feeding. To give yourself a feeding, follow these steps: 1. Lay out all of the things that you will need. 2. Make sure that the nutritional formula is at room temperature. 3. Wash your hands with soap and water. 4. Sit up or stand up straight. You will need to stay sitting up or standing up while you give yourself a feeding. 5. Make sure the syringe plunger is pushed in. Place the tip of the syringe in clean water, and slowly pull the plunger to bring (draw up) the water into the syringe. 6. Remove the clamp and the cap from the PEG tube. 7. Push the  water out of the syringe to clean (flush) the tube. 8. If the tube is clear, draw up the formula into the syringe. Make sure to use the right amount for each feeding. Add water if you need to. 9. Slowly push the formula from the syringe through the tube. 10. After the feeding, flush the tube with water. 11. Put the clamp and the cap on the tube. 12. Stay sitting up or standing up straight for at least 30 minutes. Giving medicine To give yourself medicine, follow these steps: 1. Lay out all of the things that you will need. 2. If your medicine is in tablet form, crush it and dissolve it in water. 3. Wash your hands with soap and water. 4. Sit up or stand up straight. You will need to stay sitting up or standing up while you give yourself medicine. 5. Make sure the syringe plunger is pushed in. Place the tip of the syringe in clean water, and slowly pull the plunger to bring (draw up) the water into the syringe. 6. Remove the clamp and the cap from the PEG tube. 7. Push the water out of the syringe to clean (flush) the tube. 8. If the tube is clear, draw up the medicine into the syringe. 9. Slowly push the medicine from the syringe  through the tube. 10. Flush the tube with water. 11. Put the clamp and the cap on the tube. 12. Stay sitting up or standing up straight for at least 30 minutes. Do not take sustained release (SR) medicines through your tube. If you are not sure if your medicine is an SR medicine, ask your doctor. Contact a doctor if:  The area around your stoma is sore, irritated, or red.  You have belly pain or bloating while you are feeding or after you feed.  You feel sick to your stomach (nauseous) for a long time.  You have trouble pooping (constipation) or you have watery poop (diarrhea) for a long time.  You have a fever.  You have problems with your PEG tube. Get help right away if:  Your tube is blocked.  Your tube falls out.  You have pain around your  tube.  You are bleeding from your tube.  Your tube is leaking.  You choke or you have trouble breathing while you are feeding or after you feed. Summary  A PEG tube is used to put food and fluids into the stomach.  Before you leave the hospital, you will be taught how to use and care for your PEG tube.  Your doctor will give you instructions on how to give yourself feedings and medicines.  Contact your doctor if you have fever or soreness, redness, or irritation around your stoma.  Get help right away if your tube leaks, is blocked, or falls out. Also, get help right away if you have pain or bleeding around your stoma. This information is not intended to replace advice given to you by your health care provider. Make sure you discuss any questions you have with your health care provider. Document Revised: 03/23/2018 Document Reviewed: 01/17/2017 Elsevier Patient Education  2020 [employer].

## 2019-04-26 NOTE — Telephone Encounter (Signed)
I spoke with [name] and advised her that patient can take an extra dose of his Oxycodone now and if he is still in pain in 45 mins to 1 hours that she should take him to the ER. She states she will do this

## 2019-04-26 NOTE — Telephone Encounter (Signed)
Patient had been taken care of as of now.

## 2019-04-26 NOTE — Progress Notes (Signed)
Patient post PEG placement, vitals stable. Discharge instructions and how to change dressing to PEG given to [name]/sign. Other and patient with questions answered. Denies complaints at this time.

## 2019-04-26 NOTE — Progress Notes (Signed)
Patient clinically stable post PEG placement per DR Annamaria Boots, denies complaints at this time. Awake/alert and oriented post procedure. Received Versed 3mg  along with Fentanyl 168mcg IV for procedure. PEG to low wall suction post procedure per DR [facility] request. Dressing to PEG dry and intact. Spoke with [name]/sign. Other post procedure with discharge instructions given.

## 2019-04-26 NOTE — Telephone Encounter (Signed)
I spoke with Kevin Nelson and told her per VERBAL ORDER Dr Janese Banks that PEG can be flushed with 100 ccfree water every 6 hours. She stated she is not going to start that until tomorrow

## 2019-04-26 NOTE — Telephone Encounter (Signed)
-----   Message from Karie Mainland, RD sent at 04/25/2019  3:34 PM EDT ----- Regarding: RE: PEG Tube Hi, I have called the patient and he says he is comfortable the TF ed that Joli provided last week. She will follow up on Monday! Barb ----- Message ----- From: Wayna Chalet, [place] Sent: 04/25/2019   2:27 PM EDT To: Karie Mainland, RD Subject: PEG Tube                                       Hello. Sherri, RN with Dr. Janese Banks at the [facility] told me to reach out to you to let you know that the patient is scheduled to have his PEG tube insertion tomorrow. Please let me know if there is anything I could assist you with. Have a great afternoon.

## 2019-04-27 ENCOUNTER — Inpatient Hospital Stay (HOSPITAL_BASED_OUTPATIENT_CLINIC_OR_DEPARTMENT_OTHER): Payer: Medicare Other | Admitting: Nurse Practitioner

## 2019-04-27 ENCOUNTER — Ambulatory Visit: Payer: Medicare Other

## 2019-04-27 ENCOUNTER — Encounter: Payer: Self-pay | Admitting: Oncology

## 2019-04-27 ENCOUNTER — Ambulatory Visit: Admission: RE | Admit: 2019-04-27 | Payer: Medicare Other | Source: Ambulatory Visit

## 2019-04-27 ENCOUNTER — Inpatient Hospital Stay: Payer: Medicare Other

## 2019-04-27 VITALS — BP 124/69 | HR 78 | Temp 95.7°F | Resp 22

## 2019-04-27 DIAGNOSIS — C321 Malignant neoplasm of supraglottis: Secondary | ICD-10-CM | POA: Diagnosis not present

## 2019-04-27 DIAGNOSIS — C329 Malignant neoplasm of larynx, unspecified: Secondary | ICD-10-CM | POA: Diagnosis not present

## 2019-04-27 DIAGNOSIS — Z95828 Presence of other vascular implants and grafts: Secondary | ICD-10-CM

## 2019-04-27 DIAGNOSIS — G8918 Other acute postprocedural pain: Secondary | ICD-10-CM

## 2019-04-27 DIAGNOSIS — Z5111 Encounter for antineoplastic chemotherapy: Secondary | ICD-10-CM | POA: Diagnosis not present

## 2019-04-27 DIAGNOSIS — R531 Weakness: Secondary | ICD-10-CM

## 2019-04-27 MED ORDER — SODIUM CHLORIDE 0.9% FLUSH
10.0000 mL | INTRAVENOUS | Status: DC | PRN
  Administered 2019-04-27: 10 mL via INTRAVENOUS
  Filled 2019-04-27: qty 10

## 2019-04-27 MED ORDER — HEPARIN SOD (PORK) LOCK FLUSH 100 UNIT/ML IV SOLN
500.0000 [IU] | Freq: Once | INTRAVENOUS | Status: AC
  Administered 2019-04-27: 500 [IU] via INTRAVENOUS
  Filled 2019-04-27: qty 5

## 2019-04-27 MED ORDER — OXYCODONE HCL 15 MG PO TABS: 15.0000 mg | ORAL_TABLET | ORAL | 0 refills | Status: AC | PRN

## 2019-04-27 MED ORDER — OXYCODONE HCL 10 MG PO TABS: 10.0000 mg | ORAL_TABLET | ORAL | 0 refills | Status: DC | PRN

## 2019-04-27 MED ORDER — SODIUM CHLORIDE 0.9 % IV SOLN
Freq: Once | INTRAVENOUS | Status: AC
  Filled 2019-04-27: qty 250

## 2019-04-27 MED ORDER — MORPHINE SULFATE 2 MG/ML IJ SOLN
4.0000 mg | Freq: Once | INTRAMUSCULAR | Status: AC
  Administered 2019-04-27: 4 mg via INTRAVENOUS
  Filled 2019-04-27: qty 2

## 2019-04-27 NOTE — Progress Notes (Signed)
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  Chief complaint/ Reason for visit-surgical site pain  Heme/Onc history:  Oncology History  Squamous cell carcinoma of larynx ([place])  01/04/2019 Initial Diagnosis   Squamous cell carcinoma of larynx ([place])   03/09/2019 Cancer Staging   Staging form: Larynx - Glottis, AJCC 8th Edition - Clinical stage from 03/09/2019: Stage IVA (cT4a, cN1, cM0) - Signed by Kevin Guadeloupe, MD on 03/11/2019   03/26/2019 -  Chemotherapy   The patient had palonosetron (ALOXI) injection 0.25 mg, 0.25 mg, Intravenous,  Once, 4 of 7 cycles Administration: 0.25 mg (03/26/2019), 0.25 mg (04/02/2019), 0.25 mg (04/09/2019), 0.25 mg (04/23/2019) CISplatin (PLATINOL) 60 mg in sodium chloride 0.9 % 250 mL chemo infusion, 40 mg/m2 = 60 mg, Intravenous,  Once, 4 of 7 cycles Administration: 60 mg (03/26/2019), 60 mg (04/02/2019), 60 mg (04/09/2019), 60 mg (04/23/2019) fosaprepitant (EMEND) 150 mg in sodium chloride 0.9 % 145 mL IVPB, 150 mg, Intravenous,  Once, 4 of 7 cycles Administration: 150 mg (03/26/2019), 150 mg (04/02/2019), 150 mg (04/09/2019), 150 mg (04/23/2019)  for chemotherapy treatment.      Interval history-Kevin Nelson, 69 year old male diagnosed with larynx cancer currently undergoing chemo and radiation who has been experiencing weight loss and dysphagia and had G-tube placed with Kevin Nelson, yesterday in interventional radiology. Since surgery he has had pain which he rates 10/10. He says that he was given pain  medicine through IV in his hand yesterday but doesn't feel like he received the medication as his hand swelled. He believes pain medicine is in his skin and not absorbed. Kevin Nelson he was discharged in excruciating pain. Pain localized to post-op site. Worse with coughing or movement. He took additional oxycodone last night and this morning but pain unrelieved. Hasn't moved bowels since surgery. Has chronic constipation in setting of chronic opioid use. Unsure if he's passing gas. Has chronic nausea, which is no worse. No vomiting. He hasn't removed dressing to look at surgical site or used tube for feedings or water flushes. No fevers or chills. No bruising or abdominal distention. Patient's fiance accompanies him to clinic today and contributes to history.    ECOG FS:2 - Symptomatic, <50% confined to bed  Review of systems- Review of Systems  Constitutional: Positive for malaise/fatigue and weight loss. Negative for chills and fever.  HENT: Negative for hearing loss, nosebleeds, sore throat and tinnitus.   Eyes: Negative for blurred vision and double vision.  Respiratory: Negative for cough, hemoptysis, shortness of breath and wheezing.   Cardiovascular: Negative for chest pain, palpitations and leg swelling.  Gastrointestinal: Positive for abdominal pain and constipation. Negative for blood in stool, diarrhea, melena, nausea and vomiting.  Genitourinary: Negative for dysuria and urgency.  Musculoskeletal: Negative for back pain, falls, joint pain and myalgias.  Skin: Negative for itching and rash.  Neurological: Negative for dizziness, tingling, sensory change, loss of consciousness, weakness and headaches.  Endo/Heme/Allergies: Negative for environmental allergies. Does not bruise/bleed easily.  Psychiatric/Behavioral: Negative for depression. The patient is nervous/anxious. The patient does not have insomnia.  Current treatment-concurrent chemotherapy and radiation +/-adjuvant chemotherapy  following  No Known Allergies  Past Medical History:  Diagnosis Date  . Anxiety   . Basal cell carcinoma   . Cigarette smoker   . Dental decay   . Dysphagia   . Dyspnea   . End stage COPD ([place])    end stage copd, emphysema  . Erectile dysfunction   . Headache    every morning  . Hepatitis C    treated for 8 weeks this past year with Mayvret  . History of nonmelanoma skin cancer   . Hoarseness, chronic   . Myocardial infarction [facility]) march/april 2020   mild heart attack  . Prostate cancer ([place])   . Respiratory failure, acute ([place])    was inpt for 5 days. thought he was going to die.  . Sleep apnea   . Squamous cell skin cancer   . Throat cancer ([place])   . Use of leuprolide acetate (Lupron)    treated for prostate cancer.   . 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    Past Surgical History:  Procedure Laterality Date  . FACIAL LACERATION REPAIR    . HERNIA REPAIR Left 1992   inguinal  . IR GASTROSTOMY TUBE MOD SED  04/26/2019  . MICROLARYNGOSCOPY N/A 01/17/2019   Procedure: MICROLARYNGOSCOPY WITH BX;  Surgeon: Kevin Canterbury, MD;  Location: [facility] ORS;  Service: ENT;  Laterality: N/A;  . PORTA CATH INSERTION N/A 03/15/2019   Procedure: PORTA CATH INSERTION;  Surgeon: Kevin Huxley, MD;  Location: [place] CV LAB;  Service: Cardiovascular;  Laterality: N/A;  . PROSTATE BIOPSY    . SKIN CANCER EXCISION    . TRIGGER FINGER RELEASE Left 10/2018    Social History   Socioeconomic History  . Marital status: Significant Other    Spouse name: Kevin Nelson, girlfriend  . Number of children: Not on file  . Years of education: Not on file  . Highest education level: Not on file  Occupational History  . Occupation: Chief Strategy Officer    Comment: retired  Tobacco Use  . Smoking status: Current Every Day Smoker    Years: 52.00    Types: Cigarettes  . Smokeless tobacco: Never Used  . Tobacco comment: 5 cigarettes a day  Substance and Sexual Activity  . Alcohol use: Yes     Alcohol/week: 8.0 standard drinks    Types: 8 Cans of beer per week  . Drug use: Never  . Sexual activity: Yes  Other Topics Concern  . Not on file  Social History Narrative   Patient has retired d/t respiratory status. Currently lives with girlfriend    Social Determinants of Health   Financial Resource Strain: Low Risk   . Difficulty of Paying Living Expenses: Not hard at all  Food Insecurity: No Food Insecurity  . Worried About Charity fundraiser in the Last Year: Never true  . Ran Out of Food in the Last Year: Never true  Transportation Needs: No Transportation Needs  . Lack of Transportation (Medical): No  . Lack of Transportation (Non-Medical): No  Physical Activity: Inactive  . Days of Exercise per Week: 0 days  . Minutes of Exercise per Session: 0 min  Stress: Stress Concern Present  . Feeling of Stress : Very much  Social Connections:   . Frequency of Communication with Friends and Family:   . Frequency of Social Gatherings with Friends and Family:   . Attends Religious Services:   .  Active Member of Clubs or Organizations:   . Attends Archivist Meetings:   [name] Marital Status:   Intimate Partner Violence:   . Fear of Current or Ex-Partner:   . Emotionally Abused:   [name] Physically Abused:   . Sexually Abused:     Family History  Problem Relation Age of Onset  . Cancer Sister   . Cancer - Cervical Mother   . Heart attack Brother   . Heart attack Maternal Grandfather      Current Outpatient Medications:  .  clonazePAM (KLONOPIN) 1 MG tablet, Take 1 tablet (1 mg total) by mouth 2 (two) times daily as needed for anxiety., Disp: 60 tablet, Rfl: 2 .  COMBIVENT RESPIMAT 20-100 MCG/ACT AERS respimat, INHALE 1 PUFF BY MOUTH INTO THE LUNGS EVERY 6 HOURS AS NEEDED, Disp: 4 g, Rfl: 0 .  dexamethasone (DECADRON) 4 MG tablet, Take 2 tablets by mouth once a day on the day after chemotherapy and then take 2 tablets two times a day for 2 days. Take with food., Disp:  30 tablet, Rfl: 1 .  fluticasone (FLONASE) 50 MCG/ACT nasal spray, SHAKE LIQUID AND USE 1 SPRAY IN EACH NOSTRIL TWICE DAILY, Disp: 16 g, Rfl: 1 .  ipratropium-albuterol (DUONEB) 0.5-2.5 (3) MG/3ML SOLN, Take 3 mLs by nebulization every 6 (six) hours as needed., Disp: 360 mL, Rfl: 11 .  lidocaine (XYLOCAINE) 2 % solution, Use as directed 15 mLs in the mouth or throat every 6 (six) hours as needed for mouth pain (for sore throat)., Disp: 100 mL, Rfl: 2 .  lidocaine-prilocaine (EMLA) cream, Apply to affected area once, Disp: 30 g, Rfl: 3 .  Nutritional Supplements (FEEDING SUPPLEMENT, OSMOLITE 1.5 CAL,) LIQD, Give 5 cartons per day via feeding tube (1 1/2 cartons at 8am and noon and 1 carton at 4pm and 8pm).  Flush with 31ml of water before and after each feeding.  Provides 1775 calories, 74.5 g protein and 1898ml free water and meets 100% of nutritional needs.  Send bolus supplies., Disp: 1185 mL, Rfl: 3 .  ondansetron (ZOFRAN) 4 MG tablet, Take 1 tablet (4 mg total) by mouth every 8 (eight) hours as needed for nausea or vomiting., Disp: 30 tablet, Rfl: 0 .  ondansetron (ZOFRAN) 8 MG tablet, Take 1 tablet (8 mg total) by mouth 2 (two) times daily as needed. Start on the third day after chemotherapy., Disp: 30 tablet, Rfl: 1 .  oxyCODONE (OXYCONTIN) 10 mg 12 hr tablet, Take 1 tablet (10 mg total) by mouth every 12 (twelve) hours., Disp: 60 tablet, Rfl: 0 .  Oxycodone HCl 10 MG TABS, Take 1 tablet (10 mg total) by mouth every 4 (four) hours as needed., Disp: 90 tablet, Rfl: 0 .  OXYGEN, Inhale 3 L into the lungs daily., Disp: , Rfl:  .  pantoprazole (PROTONIX) 20 MG tablet, Take 1 tablet (20 mg total) by mouth daily., Disp: 30 tablet, Rfl: 2 .  prochlorperazine (COMPAZINE) 10 MG tablet, Take 1 tablet (10 mg total) by mouth every 6 (six) hours as needed (Nausea or vomiting)., Disp: 30 tablet, Rfl: 1 .  STIOLTO RESPIMAT 2.5-2.5 MCG/ACT AERS, Inhale 2 puffs into the lungs 2 (two) times daily as needed  (shortness of breath). , Disp: , Rfl:   Physical exam:  Vitals:   04/27/19 1103  BP: 124/69  Pulse: 78  Resp: (!) 22  Temp: (!) 95.7 F (35.4 C)  TempSrc: Tympanic  SpO2: 95%   Physical Exam Constitutional:  General: He is not in acute distress.    Comments: Accompanied by fianc.  Wearing mask.  Eyes:     General: No scleral icterus. Cardiovascular:     Rate and Rhythm: Normal rate and regular rhythm.  Pulmonary:     Effort: No respiratory distress.  Abdominal:     General: Abdomen is flat. Bowel sounds are normal. There is no distension.     Palpations: Abdomen is soft.     Tenderness: There is abdominal tenderness (tenderness at surgical site).     Comments: No erythema, bruising, or obvious defect. G tube in place with overlying surgical dressing which was removed. Scant yellow drainage on dressing. No tape irritation/ulcerations.   Skin:    General: Skin is warm and dry.  Neurological:     Mental Status: He is alert and oriented to person, place, and time.  Psychiatric:        Mood and Affect: Mood is anxious.      CMP Latest Ref Rng & Units 04/23/2019  Glucose 70 - 99 mg/dL 107(H)  BUN 8 - 23 mg/dL 11  Creatinine 0.61 - 1.24 mg/dL 0.66  Sodium 135 - 145 mmol/L 135  Potassium 3.5 - 5.1 mmol/L 4.2  Chloride 98 - 111 mmol/L 96(L)  CO2 22 - 32 mmol/L 32  Calcium 8.9 - 10.3 mg/dL 8.5(L)  Total Protein 6.5 - 8.1 g/dL 6.0(L)  Total Bilirubin 0.3 - 1.2 mg/dL 0.4  Alkaline Phos 38 - 126 U/L 48  AST 15 - 41 U/L 20  ALT 0 - 44 U/L 21   CBC Latest Ref Rng & Units 04/23/2019  WBC 4.0 - 10.5 K/uL 4.3  Hemoglobin 13.0 - 17.0 g/dL 9.3(L)  Hematocrit 39.0 - 52.0 % 28.3(L)  Platelets 150 - 400 K/uL 195    No images are attached to the encounter.  IR Gastrostomy Tube  Result Date: 04/26/2019 INDICATION: Laryngeal CA, ongoing chemotherapy and radiation EXAM: FLUOROSCOPIC 20 FRENCH PULL-THROUGH GASTROSTOMY Date:  04/26/2019 04/26/2019 9:58 am Radiologist:  M. Daryll Brod,  MD Guidance:  Ultrasound and fluoroscopic MEDICATIONS: Ancef 2 g; Antibiotics were administered within 1 hour of the procedure. Glucagon 0.5 mg IV ANESTHESIA/SEDATION: Versed 3.0 mg IV; Fentanyl 100 mcg IV Moderate Sedation Time:  12 minutes The patient was continuously monitored during the procedure by the interventional radiology nurse under my direct supervision. CONTRAST:  9mL VISIPAQUE IODIXANOL 320 MG/ML IV SOLN - administered into the gastric lumen. FLUOROSCOPY TIME:  Fluoroscopy Time: 2 minutes 16 seconds (8.8 mGy). COMPLICATIONS: None immediate. PROCEDURE: Informed consent was obtained from the patient following explanation of the procedure, risks, benefits and alternatives. The patient understands, agrees and consents for the procedure. All questions were addressed. A time out was performed. Maximal barrier sterile technique utilized including caps, mask, sterile gowns, sterile gloves, large sterile drape, hand hygiene, and betadine prep. The left upper quadrant was sterilely prepped and draped. An oral gastric catheter was inserted into the stomach under fluoroscopy. The existing nasogastric feeding tube was removed. Air was injected into the stomach for insufflation and visualization under fluoroscopy. The air distended stomach was confirmed beneath the anterior abdominal wall in the frontal and lateral projections. Under sterile conditions and local anesthesia, a 81 gauge trocar needle was utilized to access the stomach percutaneously beneath the left subcostal margin. Needle position was confirmed within the stomach under biplane fluoroscopy. Contrast injection confirmed position also. A single T tack was deployed for gastropexy. Over an Amplatz guide wire, a 9-French sheath was  inserted into the stomach. A snare device was utilized to capture the oral gastric catheter. The snare device was pulled retrograde from the stomach up the esophagus and out the oropharynx. The 20-French pull-through  gastrostomy was connected to the snare device and pulled antegrade through the oropharynx down the esophagus into the stomach and then through the percutaneous tract external to the patient. The gastrostomy was assembled externally. Contrast injection confirms position in the stomach. Images were obtained for documentation. The patient tolerated procedure well. No immediate complication. IMPRESSION: Fluoroscopic insertion of a 20-French "pull-through" gastrostomy. Electronically Signed   By: Jerilynn Mages.  Shick M.D.   On: 04/26/2019 10:37    Assessment and plan- Patient is a 69 y.o. male diagnosed with squamous cell carcinoma of the supraglottic larynx stage IV currently receiving weekly cisplatin chemotherapy and radiation with malnutrition and dysphagia who presents to symptom management clinic for surgical site pain/inflammatory.  G-tube was placed yesterday.  No apparent infection, ileus, perforation, or other complication.  Suspect pain secondary to surgery.  Tube functions well and was flushed today.  Teaching to family on use provided never able to return demonstration.  Okay to use tube for feeding and discussed tube feeds and goal with patient as well as troubleshooting.  In regards to his pain, will increase his oxycodone to 15 mg every 4 hours as needed for 3 days but after that point would recommend anti-inflammatory medications such as Aleve and/were Tylenol and he can continue his OxyContin and oxycodone 10 mg for cancer associated pain.  Refills postdated today.    Disposition: Follow-up with Dr. Janese Banks as scheduled on 04/30/2019.  Return to clinic sooner if symptoms do not improve or worsen.  ER precautions provided.   Visit Diagnosis 1. Pain at surgical site     Patient expressed understanding and was in agreement with this plan. He also understands that He can call clinic at any time with any questions, concerns, or complaints.   Thank you for allowing me to participate in the care of this patient.  A total of (35) minutes of face-to-face time was spent with this patient with greater than 50% of that time in counseling and care-coordination.  Beckey Rutter, DNP, AGNP-[facility] [place]  CC: Dr. Janese Banks

## 2019-04-27 NOTE — Progress Notes (Signed)
Patient saw [facility] on Friday the only complaint was the spot around where his PEG tube was inserted.

## 2019-04-30 ENCOUNTER — Inpatient Hospital Stay (HOSPITAL_BASED_OUTPATIENT_CLINIC_OR_DEPARTMENT_OTHER): Payer: Medicare Other | Admitting: Oncology

## 2019-04-30 ENCOUNTER — Inpatient Hospital Stay: Payer: Medicare Other

## 2019-04-30 ENCOUNTER — Other Ambulatory Visit: Payer: Self-pay | Admitting: Nurse Practitioner

## 2019-04-30 ENCOUNTER — Ambulatory Visit: Payer: Medicare Other

## 2019-04-30 ENCOUNTER — Ambulatory Visit
Admission: RE | Admit: 2019-04-30 | Discharge: 2019-04-30 | Disposition: A | Discharge status: Home / Self Care | Payer: Medicare Other | Source: Ambulatory Visit | Attending: Radiation Oncology | Admitting: Radiation Oncology

## 2019-04-30 ENCOUNTER — Telehealth: Payer: Self-pay

## 2019-04-30 ENCOUNTER — Other Ambulatory Visit: Payer: Self-pay

## 2019-04-30 VITALS — BP 111/64 | HR 81 | Resp 20

## 2019-04-30 VITALS — BP 110/44 | HR 87 | Temp 96.5°F | Resp 18 | Wt 107.0 lb

## 2019-04-30 DIAGNOSIS — E860 Dehydration: Secondary | ICD-10-CM | POA: Diagnosis not present

## 2019-04-30 DIAGNOSIS — C329 Malignant neoplasm of larynx, unspecified: Secondary | ICD-10-CM

## 2019-04-30 DIAGNOSIS — G893 Neoplasm related pain (acute) (chronic): Secondary | ICD-10-CM | POA: Diagnosis not present

## 2019-04-30 DIAGNOSIS — C321 Malignant neoplasm of supraglottis: Secondary | ICD-10-CM | POA: Diagnosis not present

## 2019-04-30 DIAGNOSIS — Z5111 Encounter for antineoplastic chemotherapy: Secondary | ICD-10-CM | POA: Diagnosis not present

## 2019-04-30 DIAGNOSIS — F419 Anxiety disorder, unspecified: Secondary | ICD-10-CM | POA: Diagnosis not present

## 2019-04-30 DIAGNOSIS — E43 Unspecified severe protein-calorie malnutrition: Secondary | ICD-10-CM | POA: Diagnosis not present

## 2019-04-30 DIAGNOSIS — G8918 Other acute postprocedural pain: Secondary | ICD-10-CM | POA: Diagnosis not present

## 2019-04-30 DIAGNOSIS — J029 Acute pharyngitis, unspecified: Secondary | ICD-10-CM | POA: Diagnosis not present

## 2019-04-30 DIAGNOSIS — R1310 Dysphagia, unspecified: Secondary | ICD-10-CM | POA: Diagnosis not present

## 2019-04-30 DIAGNOSIS — R634 Abnormal weight loss: Secondary | ICD-10-CM | POA: Diagnosis not present

## 2019-04-30 DIAGNOSIS — Z95828 Presence of other vascular implants and grafts: Secondary | ICD-10-CM

## 2019-04-30 DIAGNOSIS — C61 Malignant neoplasm of prostate: Secondary | ICD-10-CM | POA: Diagnosis not present

## 2019-04-30 LAB — COMPREHENSIVE METABOLIC PANEL
ALT: 13 U/L (ref 0–44)
AST: 15 U/L (ref 15–41)
Albumin: 2.9 g/dL — ABNORMAL LOW (ref 3.5–5.0)
Alkaline Phosphatase: 44 U/L (ref 38–126)
Anion gap: 7 (ref 5–15)
BUN: 16 mg/dL (ref 8–23)
CO2: 38 mmol/L — ABNORMAL HIGH (ref 22–32)
Calcium: 8 mg/dL — ABNORMAL LOW (ref 8.9–10.3)
Chloride: 86 mmol/L — ABNORMAL LOW (ref 98–111)
Creatinine, Ser: 0.48 mg/dL — ABNORMAL LOW (ref 0.61–1.24)
GFR calc Af Amer: >60 mL/min (ref >60)
GFR calc non Af Amer: >60 mL/min (ref >60)
Glucose, Bld: 210 mg/dL — ABNORMAL HIGH (ref 70–99)
Potassium: 4.1 mmol/L (ref 3.5–5.1)
Sodium: 131 mmol/L — ABNORMAL LOW (ref 135–145)
Total Bilirubin: 0.5 mg/dL (ref 0.3–1.2)
Total Protein: 5.2 g/dL — ABNORMAL LOW (ref 6.5–8.1)

## 2019-04-30 LAB — CBC WITH DIFFERENTIAL/PLATELET
Abs Immature Granulocytes: 0.02 10*3/uL (ref 0.00–0.07)
Basophils Absolute: 0 10*3/uL (ref 0.0–0.1)
Basophils Relative: 0 %
Eosinophils Absolute: 0.1 10*3/uL (ref 0.0–0.5)
Eosinophils Relative: 1 %
HCT: 29.4 % — ABNORMAL LOW (ref 39.0–52.0)
Hemoglobin: 9.4 g/dL — ABNORMAL LOW (ref 13.0–17.0)
Immature Granulocytes: 0 %
Lymphocytes Relative: 4 %
Lymphs Abs: 0.2 10*3/uL — ABNORMAL LOW (ref 0.7–4.0)
MCH: 27.8 pg (ref 26.0–34.0)
MCHC: 32 g/dL (ref 30.0–36.0)
MCV: 87 fL (ref 80.0–100.0)
Monocytes Absolute: 0.5 10*3/uL (ref 0.1–1.0)
Monocytes Relative: 9 %
Neutro Abs: 4.4 10*3/uL (ref 1.7–7.7)
Neutrophils Relative %: 86 %
Platelets: 124 10*3/uL — ABNORMAL LOW (ref 150–400)
RBC: 3.38 MIL/uL — ABNORMAL LOW (ref 4.22–5.81)
RDW: 15.2 % (ref 11.5–15.5)
WBC: 5.2 10*3/uL (ref 4.0–10.5)
nRBC: 0 % (ref 0.0–0.2)

## 2019-04-30 LAB — MAGNESIUM: Magnesium: 1.6 mg/dL — ABNORMAL LOW (ref 1.7–2.4)

## 2019-04-30 LAB — PHOSPHORUS: Phosphorus: 2.3 mg/dL — ABNORMAL LOW (ref 2.5–4.6)

## 2019-04-30 MED ORDER — SODIUM CHLORIDE 0.9 % IV SOLN
10.0000 mg | Freq: Once | INTRAVENOUS | Status: AC
  Administered 2019-04-30: 10 mg via INTRAVENOUS
  Filled 2019-04-30: qty 10

## 2019-04-30 MED ORDER — SODIUM CHLORIDE 0.9 % IV SOLN
150.0000 mg | Freq: Once | INTRAVENOUS | Status: AC
  Administered 2019-04-30: 150 mg via INTRAVENOUS
  Filled 2019-04-30: qty 150

## 2019-04-30 MED ORDER — SODIUM CHLORIDE 0.9 % IV SOLN
Freq: Once | INTRAVENOUS | Status: AC
  Administered 2019-04-30: 500 mL via INTRAVENOUS
  Filled 2019-04-30: qty 250

## 2019-04-30 MED ORDER — KETOROLAC TROMETHAMINE 15 MG/ML IJ SOLN
15.0000 mg | Freq: Once | INTRAMUSCULAR | Status: AC
  Administered 2019-04-30: 15 mg via INTRAVENOUS
  Filled 2019-04-30: qty 1

## 2019-04-30 MED ORDER — POTASSIUM CHLORIDE 2 MEQ/ML IV SOLN
Freq: Once | INTRAVENOUS | Status: AC
  Filled 2019-04-30: qty 1000

## 2019-04-30 MED ORDER — PALONOSETRON HCL INJECTION 0.25 MG/5ML
0.2500 mg | Freq: Once | INTRAVENOUS | Status: AC
  Administered 2019-04-30: 0.25 mg via INTRAVENOUS
  Filled 2019-04-30: qty 5

## 2019-04-30 MED ORDER — SODIUM CHLORIDE 0.9 % IV SOLN
40.0000 mg/m2 | Freq: Once | INTRAVENOUS | Status: AC
  Administered 2019-04-30: 60 mg via INTRAVENOUS
  Filled 2019-04-30: qty 60

## 2019-04-30 MED ORDER — HEPARIN SOD (PORK) LOCK FLUSH 100 UNIT/ML IV SOLN
INTRAVENOUS | Status: AC
  Filled 2019-04-30: qty 5

## 2019-04-30 MED ORDER — HEPARIN SOD (PORK) LOCK FLUSH 100 UNIT/ML IV SOLN
500.0000 [IU] | Freq: Once | INTRAVENOUS | Status: AC
  Administered 2019-04-30: 500 [IU] via INTRAVENOUS
  Filled 2019-04-30: qty 5

## 2019-04-30 MED ORDER — SODIUM CHLORIDE 0.9% FLUSH
10.0000 mL | Freq: Once | INTRAVENOUS | Status: AC
  Administered 2019-04-30: 10 mL via INTRAVENOUS
  Filled 2019-04-30: qty 10

## 2019-04-30 MED ORDER — POTASSIUM & SODIUM PHOSPHATES 280-160-250 MG PO PACK: 1.0000 | PACK | Freq: Two times a day (BID) | ORAL | 0 refills | Status: DC

## 2019-04-30 MED ORDER — SODIUM CHLORIDE 0.9 % IV SOLN
Freq: Once | INTRAVENOUS | Status: AC
  Filled 2019-04-30: qty 250

## 2019-04-30 NOTE — Telephone Encounter (Signed)
Nutrition  [name], patient's girlfriend returned RD's call.    [name] reports that she thinks they started tube feeding on Saturday, 4/10 and was able to get in 1 or 2 feedings (1/2-1 carton).  Sunday, 4/11 patient was able to get in 3 feedings with 1 of those feedings being 1 full carton and the rest 1/2 cartons.  Today was able to give 1 full carton of osmolite 1.5.  Reports flushing with 61ml of water before and after each feeding.  [name] reports pain around PEG tube site over the weekend but some better.  Also had constipation and gave patient enema on Sunday which has helped relieve some of patient's pain.  [name] reports that patient ate bites of pizza over the weekend, some applesauce, 1/2 milkshake and few grapes.    Chong Sicilian confirms that they have received more formula and supplies from [employer].    Intervention: Plan to give another full carton of osmolite this evening after getting home from chemotherapy or split it between 2 feedings.   Tomorrow plan will be to give 1 full carton at 2 feedings and 1/2 carton at 2 feedings.   Wednesday plan will be to give 1 full carton at 3 feedings and 1/2 carton at 1 feeding.  [name] will increase feeding by only 1/2 carton daily. Chong Sicilian has RD's contact information and will call if further questions or concerns.    Next visit: April 19th during infusion/phone with [name].   Tyress Loden B. Zenia Resides, [place], [place] Registered Dietitian [phone] (pager)

## 2019-04-30 NOTE — Progress Notes (Signed)
Magnesium: 1.6. MD, Dr. Janese Banks, notified and aware. Per MD secure chat message: no new orders.

## 2019-04-30 NOTE — Progress Notes (Signed)
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  Patient receiving concurrent chemotherapy and radiation therapy.    Met with patient during infusion.  Patient reports that he is having pain around PEG tube site.  Reports that this starts the third day that he has used the tube.  Reports that he is giving tube feeding 3 times per day.  Can't tell RD how much water flush or formula he is giving.  States that Chong Sicilian is handling all that.  RD asked patient is RD could call and speak with [name] and patient agreed.  Patient states that yesterday he was able to eat pizza, corn and butter beans.  Mouth pain is better after taking week off treatment. States that he took an enema yesterday and had a good bowel movement times 3 yesterday.    Medications: reviewed  Labs: Mag 1.6, phosphorus 2.3, K 4.1 Glucose 210, Na 131  Anthropometrics:   Weight 107 lb today   Estimated Energy Needs  Kcals: 9861-4830 Protein: 75-88 g Fluid: > 1.5 L  NUTRITION DIAGNOSIS: Inadequate oral intake continues   INTERVENTION:  RD called [name] ([phone]) with permission of patient.  No answer.  Left message with call back phone numbers.   Patient and Chong Sicilian have been provided a written tube feeding daily schedule for titration.  Patient to have started with 1/2 carton QID and add 1/2 carton daily until 5 cartons reached.   Tube feeding at goal rate will provide 1775 calories, 74.5 g protein and 1620 ml free water (formula and water flush).   Recommend supplementing Mag and Phosphorus as patient at risk of refeeding syndrome.  Secure message sent to provider. Recommend rechecking by 4/15.       MONITORING, EVALUATION, GOAL: Patient will consume adequate calories and protein to prevent weight loss   NEXT VISIT: April 19 th during infusion  Montrelle Eddings B. Zenia Resides, [place], [place] Registered Dietitian [phone] (pager)

## 2019-04-30 NOTE — Progress Notes (Signed)
Prescription for phos-naK sent to pharmacy. Medication and dosing confirmed with Debbora Lacrosse. Medication available OTC and I've asked pharmacy to assist patient with ordering. Ok to give via PEG. Will start with BID dosing based on phosphorus level of 2.0. Recheck at next lab visit and adjust dosing based on levels. Spoke to patient and was deferred to his fiance as he was feeling poorly. Instructions provided. Advised her to call cancer center if problems obtaining medication.

## 2019-04-30 NOTE — Progress Notes (Signed)
Pharmacist Chemotherapy Monitoring - Follow Up Assessment    I verify that I have reviewed each item in the below checklist:  . Regimen for the patient is scheduled for the appropriate day and plan matches scheduled date. [name] Appropriate non-routine labs are ordered dependent on drug ordered. . If applicable, additional medications reviewed and ordered per protocol based on lifetime cumulative doses and/or treatment regimen.   Plan for follow-up and/or issues identified: No . I-vent associated with next due treatment: No . MD and/or nursing notified: No  Adrian Specht K 04/30/2019 8:38 AM

## 2019-05-01 ENCOUNTER — Encounter: Payer: Self-pay | Admitting: Nurse Practitioner

## 2019-05-01 ENCOUNTER — Ambulatory Visit
Admission: RE | Admit: 2019-05-01 | Discharge: 2019-05-01 | Disposition: A | Discharge status: Home / Self Care | Payer: Medicare Other | Source: Ambulatory Visit | Attending: Radiation Oncology | Admitting: Radiation Oncology

## 2019-05-01 DIAGNOSIS — F419 Anxiety disorder, unspecified: Secondary | ICD-10-CM | POA: Diagnosis not present

## 2019-05-01 DIAGNOSIS — R634 Abnormal weight loss: Secondary | ICD-10-CM | POA: Diagnosis not present

## 2019-05-01 DIAGNOSIS — J029 Acute pharyngitis, unspecified: Secondary | ICD-10-CM | POA: Diagnosis not present

## 2019-05-01 DIAGNOSIS — C61 Malignant neoplasm of prostate: Secondary | ICD-10-CM | POA: Diagnosis not present

## 2019-05-01 DIAGNOSIS — R1310 Dysphagia, unspecified: Secondary | ICD-10-CM | POA: Diagnosis not present

## 2019-05-01 DIAGNOSIS — C329 Malignant neoplasm of larynx, unspecified: Secondary | ICD-10-CM | POA: Diagnosis not present

## 2019-05-01 NOTE — Progress Notes (Signed)
Hematology/Oncology Consult note [facility]  [phone] [phone]  Patient Care Team: [name], NP as PCP - General (Nurse Practitioner) Greg Cutter, LCSW as Social Worker (Licensed Clinical Social Worker) De Hollingshead, [facility] as Pharmacist (Pharmacist) Vanita Ingles, RN as Case Manager (General Practice)   Name of the patient: Kevin Nelson  [id number]  [birth year]   Date of visit: 05/01/19  Diagnosis- Squamous cell carcinoma of the supraglottic larynx stage IVAcT4 acN1 cM0  Chief complaint/ Reason for visit- on treatment assessment prior to cycle 5 of weekly cisplatin chemotherapy  Heme/Onc history: patient is a 69 year old male with a past medical history significant for tobacco dependence, COPD on home oxygen who presented to Dr. Richardson Landry with symptoms of difficulty swallowing and persistent sore throat which has been ongoing for the last 1 year.NPL exam showed afriable mass involving the false vocal cord but did not involve the true vocal cords. True vocal cords remain mobile on exam. Hypopharynx and tongue base was clear.   This was followed by CT soft tissue neck which showed a 3.3 x 1.7 x 3.5 cm mass which was thought to be involving both the true and false vocal cords and extending anteriorly to involve the anterior commissure. Mass extends to involve the paraglottic space and erodes through the thyroid cartilage anteriorly and left of midline. There is also invasion of the preepiglottic space inferiorly. Enlarged cystic necrotic level 3/4 lymph node measuring 1.6 x 1.2 cm. Findings are consistent with nodal metastatic disease. Biopsy of the mass showed squamous cell carcinoma.  PET/CT showed hyppermetabolic mass extending from left glottis to anterior commisure. Solitary left level 2 metastatic Lymph node. No distant metastatic disease  Patient met with [facility] and decided against total laryngectomy.  Plan is for concurrent chemo/RT +/-adjuvantchemotherapy following chemo/RT   Interval history- patient is not at goal with his tube feeds. He is currently using 2 cans per day. Still has significant pain at his peg tube site. Bowel movements are regular  ECOG PS- 2 Pain scale- 6 Opioid associated constipation- no  Review of systems- Review of Systems  Constitutional: Positive for malaise/fatigue. Negative for chills, fever and weight loss.  HENT: Negative for congestion, ear discharge and nosebleeds.   Eyes: Negative for blurred vision.  Respiratory: Negative for cough, hemoptysis, sputum production, shortness of breath and wheezing.   Cardiovascular: Negative for chest pain, palpitations, orthopnea and claudication.  Gastrointestinal: Negative for abdominal pain, blood in stool, constipation, diarrhea, heartburn, melena, nausea and vomiting.  Genitourinary: Negative for dysuria, flank pain, frequency, hematuria and urgency.  Musculoskeletal: Negative for back pain, joint pain and myalgias.  Skin: Negative for rash.  Neurological: Negative for dizziness, tingling, focal weakness, seizures, weakness and headaches.  Endo/Heme/Allergies: Does not bruise/bleed easily.  Psychiatric/Behavioral: Negative for depression and suicidal ideas. The patient does not have insomnia.       No Known Allergies   Past Medical History:  Diagnosis Date  . Anxiety   . Basal cell carcinoma   . Cigarette smoker   . Dental decay   . Dysphagia   . Dyspnea   . End stage COPD ([place])    end stage copd, emphysema  . Erectile dysfunction   . Headache    every morning  . Hepatitis C    treated for 8 weeks this past year with Mayvret  . History of nonmelanoma skin cancer   . Hoarseness, chronic   . Myocardial infarction [facility]) march/april  2020   mild heart attack  . Prostate cancer ([place])   . Respiratory failure, acute ([place])    was inpt for 5 days. thought he was going to die.  . Sleep apnea   .  Squamous cell skin cancer   . Throat cancer ([place])   . Use of leuprolide acetate (Lupron)    treated for prostate cancer.   . 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     Past Surgical History:  Procedure Laterality Date  . FACIAL LACERATION REPAIR    . HERNIA REPAIR Left 1992   inguinal  . IR GASTROSTOMY TUBE MOD SED  04/26/2019  . MICROLARYNGOSCOPY N/A 01/17/2019   Procedure: MICROLARYNGOSCOPY WITH BX;  Surgeon: Clyde Canterbury, MD;  Location: [facility] ORS;  Service: ENT;  Laterality: N/A;  . PORTA CATH INSERTION N/A 03/15/2019   Procedure: PORTA CATH INSERTION;  Surgeon: Algernon Huxley, MD;  Location: [facility];  Service: Cardiovascular;  Laterality: N/A;  . PROSTATE BIOPSY    . SKIN CANCER EXCISION    . TRIGGER FINGER RELEASE Left 10/2018    Social History   Socioeconomic History  . Marital status: Significant Other    Spouse name: [name], girlfriend  . Number of children: Not on file  . Years of education: Not on file  . Highest education level: Not on file  Occupational History  . Occupation: Chief Strategy Officer    Comment: retired  Tobacco Use  . Smoking status: Current Every Day Smoker    Years: 52.00    Types: Cigarettes  . Smokeless tobacco: Never Used  . Tobacco comment: 5 cigarettes a day  Substance and Sexual Activity  . Alcohol use: Yes    Alcohol/week: 8.0 standard drinks    Types: 8 Cans of beer per week  . Drug use: Never  . Sexual activity: Yes  Other Topics Concern  . Not on file  Social History Narrative   Patient has retired d/t respiratory status. Currently lives with girlfriend    Social Determinants of Health   Financial Resource Strain: Low Risk   . Difficulty of Paying Living Expenses: Not hard at all  Food Insecurity: No Food Insecurity  . Worried About Charity fundraiser in the Last Year: Never true  . Ran Out of Food in the Last Year: Never true  Transportation Needs: No Transportation Needs  . Lack of Transportation  (Medical): No  . Lack of Transportation (Non-Medical): No  Physical Activity: Inactive  . Days of Exercise per Week: 0 days  . Minutes of Exercise per Session: 0 min  Stress: Stress Concern Present  . Feeling of Stress : Very much  Social Connections:   . Frequency of Communication with Friends and Family:   . Frequency of Social Gatherings with Friends and Family:   . Attends Religious Services:   . Active Member of Clubs or Organizations:   . Attends Archivist Meetings:   [name] Marital Status:   Intimate Partner Violence:   . Fear of Current or Ex-Partner:   . Emotionally Abused:   [name] Physically Abused:   . Sexually Abused:     Family History  Problem Relation Age of Onset  . Cancer Sister   . Cancer - Cervical Mother   . Heart attack Brother   . Heart attack Maternal Grandfather      Current Outpatient Medications:  .  clonazePAM (KLONOPIN) 1 MG tablet, Take 1 tablet (1 mg total) by  mouth 2 (two) times daily as needed for anxiety., Disp: 60 tablet, Rfl: 2 .  COMBIVENT RESPIMAT 20-100 MCG/ACT AERS respimat, INHALE 1 PUFF BY MOUTH INTO THE LUNGS EVERY 6 HOURS AS NEEDED, Disp: 4 g, Rfl: 0 .  dexamethasone (DECADRON) 4 MG tablet, Take 2 tablets by mouth once a day on the day after chemotherapy and then take 2 tablets two times a day for 2 days. Take with food., Disp: 30 tablet, Rfl: 1 .  fluticasone (FLONASE) 50 MCG/ACT nasal spray, SHAKE LIQUID AND USE 1 SPRAY IN EACH NOSTRIL TWICE DAILY, Disp: 16 g, Rfl: 1 .  ipratropium-albuterol (DUONEB) 0.5-2.5 (3) MG/3ML SOLN, Take 3 mLs by nebulization every 6 (six) hours as needed., Disp: 360 mL, Rfl: 11 .  lidocaine (XYLOCAINE) 2 % solution, Use as directed 15 mLs in the mouth or throat every 6 (six) hours as needed for mouth pain (for sore throat)., Disp: 100 mL, Rfl: 2 .  lidocaine-prilocaine (EMLA) cream, Apply to affected area once, Disp: 30 g, Rfl: 3 .  Nutritional Supplements (FEEDING SUPPLEMENT, OSMOLITE 1.5 CAL,) LIQD, Give  5 cartons per day via feeding tube (1 1/2 cartons at 8am and noon and 1 carton at 4pm and 8pm).  Flush with 32m of water before and after each feeding.  Provides 1775 calories, 74.5 g protein and 18037mfree water and meets 100% of nutritional needs.  Send bolus supplies., Disp: 1185 mL, Rfl: 3 .  ondansetron (ZOFRAN) 4 MG tablet, Take 1 tablet (4 mg total) by mouth every 8 (eight) hours as needed for nausea or vomiting., Disp: 30 tablet, Rfl: 0 .  ondansetron (ZOFRAN) 8 MG tablet, Take 1 tablet (8 mg total) by mouth 2 (two) times daily as needed. Start on the third day after chemotherapy., Disp: 30 tablet, Rfl: 1 .  oxyCODONE (OXYCONTIN) 10 mg 12 hr tablet, Take 1 tablet (10 mg total) by mouth every 12 (twelve) hours., Disp: 60 tablet, Rfl: 0 .  Oxycodone HCl 10 MG TABS, Take 1 tablet (10 mg total) by mouth every 4 (four) hours as needed., Disp: 90 tablet, Rfl: 0 .  OXYGEN, Inhale 3 L into the lungs daily., Disp: , Rfl:  .  pantoprazole (PROTONIX) 20 MG tablet, Take 1 tablet (20 mg total) by mouth daily., Disp: 30 tablet, Rfl: 2 .  prochlorperazine (COMPAZINE) 10 MG tablet, Take 1 tablet (10 mg total) by mouth every 6 (six) hours as needed (Nausea or vomiting)., Disp: 30 tablet, Rfl: 1 .  STIOLTO RESPIMAT 2.5-2.5 MCG/ACT AERS, Inhale 2 puffs into the lungs 2 (two) times daily as needed (shortness of breath). , Disp: , Rfl:  .  potassium & sodium phosphates (PHOS-NAK) 280-160-250 MG PACK, Take 1 packet by mouth 2 (two) times daily. For hypophosphatemia. Give via PEG., Disp: 120 each, Rfl: 0  Physical exam:  Vitals:   04/30/19 0913  BP: (!) 110/44  Pulse: 87  Resp: 18  Temp: (!) 96.5 F (35.8 C)  TempSrc: Tympanic  SpO2: 100%  Weight: 107 lb (48.5 kg)   Physical Exam Constitutional:      Comments: Appears fatigued. Thin and cachectic  HENT:     Head: Normocephalic and atraumatic.  Eyes:     Pupils: Pupils are equal, round, and reactive to light.  Cardiovascular:     Rate and Rhythm:  Normal rate and regular rhythm.     Heart sounds: Normal heart sounds.  Pulmonary:     Effort: Pulmonary effort is normal.  Breath sounds: Normal breath sounds.  Abdominal:     General: Bowel sounds are normal.     Palpations: Abdomen is soft.     Comments: Peg tube in place. There is mild TTP around the tube  Musculoskeletal:     Cervical back: Normal range of motion.  Skin:    General: Skin is warm and dry.  Neurological:     Mental Status: He is alert and oriented to person, place, and time.      CMP Latest Ref Rng & Units 04/30/2019  Glucose 70 - 99 mg/dL 210(H)  BUN 8 - 23 mg/dL 16  Creatinine 0.61 - 1.24 mg/dL 0.48(L)  Sodium 135 - 145 mmol/L 131(L)  Potassium 3.5 - 5.1 mmol/L 4.1  Chloride 98 - 111 mmol/L 86(L)  CO2 22 - 32 mmol/L 38(H)  Calcium 8.9 - 10.3 mg/dL 8.0(L)  Total Protein 6.5 - 8.1 g/dL 5.2(L)  Total Bilirubin 0.3 - 1.2 mg/dL 0.5  Alkaline Phos 38 - 126 U/L 44  AST 15 - 41 U/L 15  ALT 0 - 44 U/L 13   CBC Latest Ref Rng & Units 04/30/2019  WBC 4.0 - 10.5 K/uL 5.2  Hemoglobin 13.0 - 17.0 g/dL 9.4(L)  Hematocrit 39.0 - 52.0 % 29.4(L)  Platelets 150 - 400 K/uL 124(L)    No images are attached to the encounter.  IR Gastrostomy Tube  Result Date: 04/26/2019 INDICATION: Laryngeal CA, ongoing chemotherapy and radiation EXAM: FLUOROSCOPIC 20 FRENCH PULL-THROUGH GASTROSTOMY Date:  04/26/2019 04/26/2019 9:58 am Radiologist:  M. Daryll Brod, MD Guidance:  Ultrasound and fluoroscopic MEDICATIONS: Ancef 2 g; Antibiotics were administered within 1 hour of the procedure. Glucagon 0.5 mg IV ANESTHESIA/SEDATION: Versed 3.0 mg IV; Fentanyl 100 mcg IV Moderate Sedation Time:  12 minutes The patient was continuously monitored during the procedure by the interventional radiology nurse under my direct supervision. CONTRAST:  39m VISIPAQUE IODIXANOL 320 MG/ML IV SOLN - administered into the gastric lumen. FLUOROSCOPY TIME:  Fluoroscopy Time: 2 minutes 16 seconds (8.8 mGy).  COMPLICATIONS: None immediate. PROCEDURE: Informed consent was obtained from the patient following explanation of the procedure, risks, benefits and alternatives. The patient understands, agrees and consents for the procedure. All questions were addressed. A time out was performed. Maximal barrier sterile technique utilized including caps, mask, sterile gowns, sterile gloves, large sterile drape, hand hygiene, and betadine prep. The left upper quadrant was sterilely prepped and draped. An oral gastric catheter was inserted into the stomach under fluoroscopy. The existing nasogastric feeding tube was removed. Air was injected into the stomach for insufflation and visualization under fluoroscopy. The air distended stomach was confirmed beneath the anterior abdominal wall in the frontal and lateral projections. Under sterile conditions and local anesthesia, a 175gauge trocar needle was utilized to access the stomach percutaneously beneath the left subcostal margin. Needle position was confirmed within the stomach under biplane fluoroscopy. Contrast injection confirmed position also. A single T tack was deployed for gastropexy. Over an Amplatz guide wire, a 9-French sheath was inserted into the stomach. A snare device was utilized to capture the oral gastric catheter. The snare device was pulled retrograde from the stomach up the esophagus and out the oropharynx. The 20-French pull-through gastrostomy was connected to the snare device and pulled antegrade through the oropharynx down the esophagus into the stomach and then through the percutaneous tract external to the patient. The gastrostomy was assembled externally. Contrast injection confirms position in the stomach. Images were obtained for documentation. The patient tolerated  procedure well. No immediate complication. IMPRESSION: Fluoroscopic insertion of a 20-French "pull-through" gastrostomy. Electronically Signed   By: Jerilynn Mages.  Shick M.D.   On: 04/26/2019 10:37      Assessment and plan- Patient is a 69 y.o. male  withsquamous cell carcinoma of the supraglottic larynx stage IVA cT4a cN1cM0.he is here for on treatment assessment prior to cycle 5 of weekly cisplatin chemotherapy  1.  Patient now has a PEG tube in place and he is still not at goal with his nutrition.  He ideally requires 4 cans/day and he is up to 2 cans at this time.  Foye Spurling will continue to follow him.  He will receive 1000 mL precisplatin fluids and 500 post cisplatin today.  He will return again on Friday for more fluids.  2.  Counts okay to proceed with cycle 5 of weekly cisplatin chemotherapy along with radiation treatment and I will see him back in 1 week for cycle 6.  3.  Chemo-induced anemia and mild thrombocytopenia: Stable continue to monitor  4.  Hypophosphatemia: Patient is at risk of refeeding syndrome.  We have been checking his electrolytes and plan to give him phosphorus replacement via PEG tube  5.  Patient complains of pain around the PEG tube site which is likely inflammatory.  I have advised him to take Tylenol and an occasional dose of NSAIDs such as Aleve or Motrin.  He can continue to use OxyContin and oxycodone as well but that is mainly for his cancer associated pain.  He may have more opioid-induced constipation if he uses narcotics for PEG tube associated pain as well.  I suspect his pain should start to ease over the next 1 week to 10 days.  I will give him 1 dose of Toradol today  6.  I will see him back in 1 week's time with CBC with differential, CMP, mag and phos for cycle 6 of weekly cisplatin chemotherapy  7.  Neoplasm related pain: Continue OxyContin and as needed oxycodone  8.  Hypomagnesemia: He will be receiving extra magnesium through his precisplatin fluids   Visit Diagnosis 1. Severe protein-calorie malnutrition ([place])   2. Encounter for antineoplastic chemotherapy   3. Squamous cell carcinoma of larynx (HCC)   4. Dehydration   5.  Neoplasm related pain   6. Hypophosphatemia      Dr. Randa Evens, MD, MPH [facility] at [facility] [phone] 05/01/2019 12:40 PM

## 2019-05-02 ENCOUNTER — Other Ambulatory Visit: Payer: Self-pay | Admitting: *Deleted

## 2019-05-02 ENCOUNTER — Ambulatory Visit
Admission: RE | Admit: 2019-05-02 | Discharge: 2019-05-02 | Disposition: A | Discharge status: Home / Self Care | Payer: Medicare Other | Source: Ambulatory Visit | Attending: Radiation Oncology | Admitting: Radiation Oncology

## 2019-05-02 DIAGNOSIS — R634 Abnormal weight loss: Secondary | ICD-10-CM | POA: Diagnosis not present

## 2019-05-02 DIAGNOSIS — F419 Anxiety disorder, unspecified: Secondary | ICD-10-CM | POA: Diagnosis not present

## 2019-05-02 DIAGNOSIS — J029 Acute pharyngitis, unspecified: Secondary | ICD-10-CM | POA: Diagnosis not present

## 2019-05-02 DIAGNOSIS — C329 Malignant neoplasm of larynx, unspecified: Secondary | ICD-10-CM | POA: Diagnosis not present

## 2019-05-02 DIAGNOSIS — C61 Malignant neoplasm of prostate: Secondary | ICD-10-CM | POA: Diagnosis not present

## 2019-05-02 DIAGNOSIS — R1310 Dysphagia, unspecified: Secondary | ICD-10-CM | POA: Diagnosis not present

## 2019-05-02 NOTE — Telephone Encounter (Signed)
This was sent on the 9th to fill on the 12th. I called pharmacy and they had not released it yet. Patient informed

## 2019-05-03 ENCOUNTER — Inpatient Hospital Stay: Payer: Medicare Other

## 2019-05-03 ENCOUNTER — Other Ambulatory Visit: Payer: Self-pay

## 2019-05-03 ENCOUNTER — Other Ambulatory Visit: Payer: Self-pay | Admitting: *Deleted

## 2019-05-03 ENCOUNTER — Ambulatory Visit
Admission: RE | Admit: 2019-05-03 | Discharge: 2019-05-03 | Disposition: A | Discharge status: Home / Self Care | Payer: Medicare Other | Source: Ambulatory Visit | Attending: Radiation Oncology | Admitting: Radiation Oncology

## 2019-05-03 DIAGNOSIS — C329 Malignant neoplasm of larynx, unspecified: Secondary | ICD-10-CM

## 2019-05-03 DIAGNOSIS — G8918 Other acute postprocedural pain: Secondary | ICD-10-CM | POA: Diagnosis not present

## 2019-05-03 DIAGNOSIS — C61 Malignant neoplasm of prostate: Secondary | ICD-10-CM | POA: Diagnosis not present

## 2019-05-03 DIAGNOSIS — J029 Acute pharyngitis, unspecified: Secondary | ICD-10-CM | POA: Diagnosis not present

## 2019-05-03 DIAGNOSIS — R1310 Dysphagia, unspecified: Secondary | ICD-10-CM | POA: Diagnosis not present

## 2019-05-03 DIAGNOSIS — E43 Unspecified severe protein-calorie malnutrition: Secondary | ICD-10-CM

## 2019-05-03 DIAGNOSIS — Z5111 Encounter for antineoplastic chemotherapy: Secondary | ICD-10-CM | POA: Diagnosis not present

## 2019-05-03 DIAGNOSIS — C321 Malignant neoplasm of supraglottis: Secondary | ICD-10-CM | POA: Diagnosis not present

## 2019-05-03 DIAGNOSIS — R634 Abnormal weight loss: Secondary | ICD-10-CM | POA: Diagnosis not present

## 2019-05-03 DIAGNOSIS — F419 Anxiety disorder, unspecified: Secondary | ICD-10-CM | POA: Diagnosis not present

## 2019-05-03 LAB — MAGNESIUM: Magnesium: 1.6 mg/dL — ABNORMAL LOW (ref 1.7–2.4)

## 2019-05-03 LAB — CBC WITH DIFFERENTIAL/PLATELET
Abs Immature Granulocytes: 0.02 10*3/uL (ref 0.00–0.07)
Basophils Absolute: 0 10*3/uL (ref 0.0–0.1)
Basophils Relative: 0 %
Eosinophils Absolute: 0 10*3/uL (ref 0.0–0.5)
Eosinophils Relative: 0 %
HCT: 28.9 % — ABNORMAL LOW (ref 39.0–52.0)
Hemoglobin: 9.4 g/dL — ABNORMAL LOW (ref 13.0–17.0)
Immature Granulocytes: 1 %
Lymphocytes Relative: 9 %
Lymphs Abs: 0.4 10*3/uL — ABNORMAL LOW (ref 0.7–4.0)
MCH: 28.1 pg (ref 26.0–34.0)
MCHC: 32.5 g/dL (ref 30.0–36.0)
MCV: 86.5 fL (ref 80.0–100.0)
Monocytes Absolute: 0.7 10*3/uL (ref 0.1–1.0)
Monocytes Relative: 16 %
Neutro Abs: 3.3 10*3/uL (ref 1.7–7.7)
Neutrophils Relative %: 74 %
Platelets: 150 10*3/uL (ref 150–400)
RBC: 3.34 MIL/uL — ABNORMAL LOW (ref 4.22–5.81)
RDW: 15.6 % — ABNORMAL HIGH (ref 11.5–15.5)
WBC: 4.4 10*3/uL (ref 4.0–10.5)
nRBC: 0 % (ref 0.0–0.2)

## 2019-05-03 LAB — COMPREHENSIVE METABOLIC PANEL
ALT: 16 U/L (ref 0–44)
AST: 17 U/L (ref 15–41)
Albumin: 3.3 g/dL — ABNORMAL LOW (ref 3.5–5.0)
Alkaline Phosphatase: 45 U/L (ref 38–126)
Anion gap: 11 (ref 5–15)
BUN: 24 mg/dL — ABNORMAL HIGH (ref 8–23)
CO2: 33 mmol/L — ABNORMAL HIGH (ref 22–32)
Calcium: 8 mg/dL — ABNORMAL LOW (ref 8.9–10.3)
Chloride: 85 mmol/L — ABNORMAL LOW (ref 98–111)
Creatinine, Ser: 0.57 mg/dL — ABNORMAL LOW (ref 0.61–1.24)
GFR calc Af Amer: >60 mL/min (ref >60)
GFR calc non Af Amer: >60 mL/min (ref >60)
Glucose, Bld: 158 mg/dL — ABNORMAL HIGH (ref 70–99)
Potassium: 5.1 mmol/L (ref 3.5–5.1)
Sodium: 129 mmol/L — ABNORMAL LOW (ref 135–145)
Total Bilirubin: 0.5 mg/dL (ref 0.3–1.2)
Total Protein: 5.8 g/dL — ABNORMAL LOW (ref 6.5–8.1)

## 2019-05-03 LAB — PHOSPHORUS: Phosphorus: 3.4 mg/dL (ref 2.5–4.6)

## 2019-05-03 MED ORDER — SODIUM CHLORIDE 0.9 % IV SOLN
Freq: Once | INTRAVENOUS | Status: DC
  Filled 2019-05-03: qty 1000

## 2019-05-03 MED ORDER — OXYCODONE HCL ER 10 MG PO T12A: 10.0000 mg | EXTENDED_RELEASE_TABLET | Freq: Two times a day (BID) | ORAL | 0 refills | Status: DC

## 2019-05-04 ENCOUNTER — Inpatient Hospital Stay: Payer: Medicare Other

## 2019-05-04 ENCOUNTER — Ambulatory Visit
Admission: RE | Admit: 2019-05-04 | Discharge: 2019-05-04 | Disposition: A | Discharge status: Home / Self Care | Payer: Medicare Other | Source: Ambulatory Visit | Attending: Radiation Oncology | Admitting: Radiation Oncology

## 2019-05-04 DIAGNOSIS — F419 Anxiety disorder, unspecified: Secondary | ICD-10-CM | POA: Diagnosis not present

## 2019-05-04 DIAGNOSIS — Z95828 Presence of other vascular implants and grafts: Secondary | ICD-10-CM

## 2019-05-04 DIAGNOSIS — G8918 Other acute postprocedural pain: Secondary | ICD-10-CM | POA: Diagnosis not present

## 2019-05-04 DIAGNOSIS — C61 Malignant neoplasm of prostate: Secondary | ICD-10-CM | POA: Diagnosis not present

## 2019-05-04 DIAGNOSIS — R1310 Dysphagia, unspecified: Secondary | ICD-10-CM | POA: Diagnosis not present

## 2019-05-04 DIAGNOSIS — C329 Malignant neoplasm of larynx, unspecified: Secondary | ICD-10-CM | POA: Diagnosis not present

## 2019-05-04 DIAGNOSIS — J029 Acute pharyngitis, unspecified: Secondary | ICD-10-CM | POA: Diagnosis not present

## 2019-05-04 DIAGNOSIS — Z5111 Encounter for antineoplastic chemotherapy: Secondary | ICD-10-CM | POA: Diagnosis not present

## 2019-05-04 DIAGNOSIS — E860 Dehydration: Secondary | ICD-10-CM

## 2019-05-04 DIAGNOSIS — R634 Abnormal weight loss: Secondary | ICD-10-CM | POA: Diagnosis not present

## 2019-05-04 DIAGNOSIS — C321 Malignant neoplasm of supraglottis: Secondary | ICD-10-CM | POA: Diagnosis not present

## 2019-05-04 MED ORDER — MAGNESIUM SULFATE 2 GM/50ML IV SOLN
2.0000 g | Freq: Once | INTRAVENOUS | Status: AC
  Administered 2019-05-04: 2 g via INTRAVENOUS
  Filled 2019-05-04: qty 50

## 2019-05-04 MED ORDER — HEPARIN SOD (PORK) LOCK FLUSH 100 UNIT/ML IV SOLN
500.0000 [IU] | Freq: Once | INTRAVENOUS | Status: AC
  Administered 2019-05-04: 500 [IU] via INTRAVENOUS
  Filled 2019-05-04: qty 5

## 2019-05-04 MED ORDER — SODIUM CHLORIDE 0.9% FLUSH
10.0000 mL | INTRAVENOUS | Status: DC | PRN
  Administered 2019-05-04: 10 mL via INTRAVENOUS
  Filled 2019-05-04: qty 10

## 2019-05-04 MED ORDER — SODIUM CHLORIDE 0.9 % IV SOLN
Freq: Once | INTRAVENOUS | Status: AC
  Filled 2019-05-04: qty 250

## 2019-05-05 ENCOUNTER — Ambulatory Visit: Payer: Medicare Other

## 2019-05-07 ENCOUNTER — Inpatient Hospital Stay (HOSPITAL_BASED_OUTPATIENT_CLINIC_OR_DEPARTMENT_OTHER): Payer: Medicare Other | Admitting: Oncology

## 2019-05-07 ENCOUNTER — Encounter: Payer: Self-pay | Admitting: Oncology

## 2019-05-07 ENCOUNTER — Ambulatory Visit: Payer: Medicare Other

## 2019-05-07 ENCOUNTER — Inpatient Hospital Stay: Payer: Medicare Other

## 2019-05-07 ENCOUNTER — Other Ambulatory Visit: Payer: Self-pay

## 2019-05-07 VITALS — BP 90/66 | HR 85 | Temp 96.1°F | Resp 16 | Wt 98.4 lb

## 2019-05-07 DIAGNOSIS — Z5111 Encounter for antineoplastic chemotherapy: Secondary | ICD-10-CM | POA: Diagnosis not present

## 2019-05-07 DIAGNOSIS — E43 Unspecified severe protein-calorie malnutrition: Secondary | ICD-10-CM

## 2019-05-07 DIAGNOSIS — C329 Malignant neoplasm of larynx, unspecified: Secondary | ICD-10-CM | POA: Diagnosis not present

## 2019-05-07 DIAGNOSIS — G8918 Other acute postprocedural pain: Secondary | ICD-10-CM | POA: Diagnosis not present

## 2019-05-07 DIAGNOSIS — C321 Malignant neoplasm of supraglottis: Secondary | ICD-10-CM | POA: Diagnosis not present

## 2019-05-07 DIAGNOSIS — G893 Neoplasm related pain (acute) (chronic): Secondary | ICD-10-CM | POA: Diagnosis not present

## 2019-05-07 LAB — CBC WITH DIFFERENTIAL/PLATELET
Abs Immature Granulocytes: 0.02 10*3/uL (ref 0.00–0.07)
Basophils Absolute: 0 10*3/uL (ref 0.0–0.1)
Basophils Relative: 0 %
Eosinophils Absolute: 0 10*3/uL (ref 0.0–0.5)
Eosinophils Relative: 1 %
HCT: 28.4 % — ABNORMAL LOW (ref 39.0–52.0)
Hemoglobin: 9.4 g/dL — ABNORMAL LOW (ref 13.0–17.0)
Immature Granulocytes: 0 %
Lymphocytes Relative: 8 %
Lymphs Abs: 0.5 10*3/uL — ABNORMAL LOW (ref 0.7–4.0)
MCH: 28.1 pg (ref 26.0–34.0)
MCHC: 33.1 g/dL (ref 30.0–36.0)
MCV: 85 fL (ref 80.0–100.0)
Monocytes Absolute: 0.5 10*3/uL (ref 0.1–1.0)
Monocytes Relative: 9 %
Neutro Abs: 4.5 10*3/uL (ref 1.7–7.7)
Neutrophils Relative %: 82 %
Platelets: 173 10*3/uL (ref 150–400)
RBC: 3.34 MIL/uL — ABNORMAL LOW (ref 4.22–5.81)
RDW: 15.3 % (ref 11.5–15.5)
WBC: 5.6 10*3/uL (ref 4.0–10.5)
nRBC: 0 % (ref 0.0–0.2)

## 2019-05-07 LAB — COMPREHENSIVE METABOLIC PANEL
ALT: 19 U/L (ref 0–44)
AST: 20 U/L (ref 15–41)
Albumin: 3.3 g/dL — ABNORMAL LOW (ref 3.5–5.0)
Alkaline Phosphatase: 47 U/L (ref 38–126)
Anion gap: 9 (ref 5–15)
BUN: 19 mg/dL (ref 8–23)
CO2: 32 mmol/L (ref 22–32)
Calcium: 8.3 mg/dL — ABNORMAL LOW (ref 8.9–10.3)
Chloride: 90 mmol/L — ABNORMAL LOW (ref 98–111)
Creatinine, Ser: 0.64 mg/dL (ref 0.61–1.24)
GFR calc Af Amer: >60 mL/min (ref >60)
GFR calc non Af Amer: >60 mL/min (ref >60)
Glucose, Bld: 142 mg/dL — ABNORMAL HIGH (ref 70–99)
Potassium: 4.4 mmol/L (ref 3.5–5.1)
Sodium: 131 mmol/L — ABNORMAL LOW (ref 135–145)
Total Bilirubin: 0.5 mg/dL (ref 0.3–1.2)
Total Protein: 6.1 g/dL — ABNORMAL LOW (ref 6.5–8.1)

## 2019-05-07 LAB — MAGNESIUM: Magnesium: 1.6 mg/dL — ABNORMAL LOW (ref 1.7–2.4)

## 2019-05-07 LAB — PHOSPHORUS: Phosphorus: 3.6 mg/dL (ref 2.5–4.6)

## 2019-05-07 MED ORDER — MAGNESIUM SULFATE 4 GM/100ML IV SOLN: 4.0000 g | Freq: Once | INTRAVENOUS | Status: DC

## 2019-05-07 MED ORDER — HEPARIN SOD (PORK) LOCK FLUSH 100 UNIT/ML IV SOLN
500.0000 [IU] | Freq: Once | INTRAVENOUS | Status: AC
  Administered 2019-05-07: 500 [IU] via INTRAVENOUS
  Filled 2019-05-07: qty 5

## 2019-05-07 MED ORDER — SODIUM CHLORIDE 0.9 % IV SOLN
Freq: Once | INTRAVENOUS | Status: AC
  Filled 2019-05-07: qty 250

## 2019-05-07 MED ORDER — SODIUM CHLORIDE 0.9 % IV SOLN
Freq: Once | INTRAVENOUS | Status: AC
  Filled 2019-05-07: qty 1000

## 2019-05-07 MED ORDER — SENNOSIDES 8.8 MG/5ML PO SYRP: 5.0000 mL | ORAL_SOLUTION | Freq: Every day | ORAL | 3 refills | Status: DC

## 2019-05-07 NOTE — Progress Notes (Signed)
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 Richardson Nelson with symptoms of difficulty swallowing and persistent sore throat which has been ongoing for the last 1 year.NPL exam showed afriable mass involving the false vocal cord but did not involve the true vocal cords. True vocal cords remain mobile on exam. Hypopharynx and tongue base was clear.   This was followed by CT soft tissue neck which showed a 3.3 x 1.7 x 3.5 cm mass which was thought to be involving both the true and false vocal cords and extending anteriorly to involve the anterior commissure. Mass extends to involve the paraglottic space and erodes through the thyroid cartilage anteriorly and left of midline. There is also invasion of the preepiglottic space inferiorly. Enlarged cystic necrotic level 3/4 lymph node measuring 1.6 x 1.2 cm. Findings are consistent with nodal metastatic disease. Biopsy of the mass showed squamous cell carcinoma.  PET/CT showed hyppermetabolic mass extending from left glottis to anterior commisure. Solitary left level 2 metastatic Lymph node. No distant metastatic disease  Patient met with [facility] and decided against total laryngectomy. Plan  is for concurrent chemo/RT +/-adjuvantchemotherapy following chemo/RT   Interval history-he is up to taking 3 cans of tube feeds per day but has not been able to reach his goal of 4.  He feels extremely fatigued and feels that he aches all over especially in his back.  He also has ongoing dental issues and feel that his teeth hurt.He finds it difficult to swallow Senokot  ECOG PS- 2 Pain scale- 5 Opioid associated constipation- no  Review of systems- Review of Systems  Constitutional: Negative for chills, fever, malaise/fatigue and weight loss.  HENT: Negative for congestion, ear discharge and nosebleeds.   Eyes: Negative for blurred vision.  Respiratory: Negative for cough, hemoptysis, sputum production, shortness of breath and wheezing.   Cardiovascular: Negative for chest pain, palpitations, orthopnea and claudication.  Gastrointestinal: Negative for abdominal pain, blood in stool, constipation, diarrhea, heartburn, melena, nausea and vomiting.  Genitourinary: Negative for dysuria, flank pain, frequency, hematuria and urgency.  Musculoskeletal: Negative for back pain, joint pain and myalgias.  Skin: Negative for rash.  Neurological: Negative for dizziness, tingling, focal weakness, seizures, weakness and headaches.  Endo/Heme/Allergies: Does not bruise/bleed easily.  Psychiatric/Behavioral: Negative for depression and suicidal ideas. The patient does not have insomnia.        No Known Allergies   Past Medical History:  Diagnosis Date  . Anxiety   . Basal cell carcinoma   . Cigarette smoker   . Dental decay   . Dysphagia   . Dyspnea   . End stage COPD ([place])    end stage copd, emphysema  . Erectile dysfunction   . Headache    every morning  . Hepatitis C    treated for 8 weeks  this past year with Mayvret  . History of nonmelanoma skin cancer   . Hoarseness, chronic   . Myocardial infarction [facility]) march/april 2020   mild heart attack  . Prostate cancer ([place])   .  Respiratory failure, acute ([place])    was inpt for 5 days. thought he was going to die.  . Sleep apnea   . Squamous cell skin cancer   . Throat cancer ([place])   . Use of leuprolide acetate (Lupron)    treated for prostate cancer.   . 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     Past Surgical History:  Procedure Laterality Date  . FACIAL LACERATION REPAIR    . HERNIA REPAIR Left 1992   inguinal  . IR GASTROSTOMY TUBE MOD SED  04/26/2019  . MICROLARYNGOSCOPY N/A 01/17/2019   Procedure: MICROLARYNGOSCOPY WITH BX;  Surgeon: Kevin Canterbury, MD;  Location: [facility] ORS;  Service: ENT;  Laterality: N/A;  . PORTA CATH INSERTION N/A 03/15/2019   Procedure: PORTA CATH INSERTION;  Surgeon: Kevin Huxley, MD;  Location: [facility];  Service: Cardiovascular;  Laterality: N/A;  . PROSTATE BIOPSY    . SKIN CANCER EXCISION    . TRIGGER FINGER RELEASE Left 10/2018    Social History   Socioeconomic History  . Marital status: Significant Other    Spouse name: Kevin Nelson, girlfriend  . Number of children: Not on file  . Years of education: Not on file  . Highest education level: Not on file  Occupational History  . Occupation: Chief Strategy Officer    Comment: retired  Tobacco Use  . Smoking status: Current Every Day Smoker    Years: 52.00    Types: Cigarettes  . Smokeless tobacco: Never Used  . Tobacco comment: 5 cigarettes a day  Substance and Sexual Activity  . Alcohol use: Yes    Alcohol/week: 8.0 standard drinks    Types: 8 Cans of beer per week  . Drug use: Never  . Sexual activity: Yes  Other Topics Concern  . Not on file  Social History Narrative   Patient has retired d/t respiratory status. Currently lives with girlfriend    Social Determinants of Health   Financial Resource Strain: Low Risk   . Difficulty of Paying Living Expenses: Not hard at all  Food Insecurity: No Food Insecurity  . Worried About Charity fundraiser in the Last Year: Never true  . Ran Out of Food in  the Last Year: Never true  Transportation Needs: No Transportation Needs  . Lack of Transportation (Medical): No  . Lack of Transportation (Non-Medical): No  Physical Activity: Inactive  . Days of Exercise per Week: 0 days  . Minutes of Exercise per Session: 0 min  Stress: Stress Concern Present  . Feeling of Stress : Very much  Social Connections:   . Frequency of Communication with Friends and Family:   . Frequency of Social Gatherings with Friends and Family:   . Attends Religious Services:   . Active Member of Clubs or Organizations:   . Attends Archivist Meetings:   [name] Marital Status:   Intimate Partner Violence:   . Fear of Current or Ex-Partner:   . Emotionally Abused:   [name] Physically Abused:   . Sexually Abused:     Family History  Problem Relation Age of Onset  . Cancer Sister   . Cancer - Cervical Mother   . Heart attack Brother   . Heart attack Maternal  Grandfather      Current Outpatient Medications:  .  clonazePAM (KLONOPIN) 1 MG tablet, Take 1 tablet (1 mg total) by mouth 2 (two) times daily as needed for anxiety., Disp: 60 tablet, Rfl: 2 .  COMBIVENT RESPIMAT 20-100 MCG/ACT AERS respimat, INHALE 1 PUFF BY MOUTH INTO THE LUNGS EVERY 6 HOURS AS NEEDED, Disp: 4 g, Rfl: 0 .  dexamethasone (DECADRON) 4 MG tablet, Take 2 tablets by mouth once a day on the day after chemotherapy and then take 2 tablets two times a day for 2 days. Take with food., Disp: 30 tablet, Rfl: 1 .  fluticasone (FLONASE) 50 MCG/ACT nasal spray, SHAKE LIQUID AND USE 1 SPRAY IN EACH NOSTRIL TWICE DAILY, Disp: 16 g, Rfl: 1 .  ipratropium-albuterol (DUONEB) 0.5-2.5 (3) MG/3ML SOLN, Take 3 mLs by nebulization every 6 (six) hours as needed., Disp: 360 mL, Rfl: 11 .  lidocaine (XYLOCAINE) 2 % solution, Use as directed 15 mLs in the mouth or throat every 6 (six) hours as needed for mouth pain (for sore throat)., Disp: 100 mL, Rfl: 2 .  lidocaine-prilocaine (EMLA) cream, Apply to affected area  once, Disp: 30 g, Rfl: 3 .  Nutritional Supplements (FEEDING SUPPLEMENT, OSMOLITE 1.5 CAL,) LIQD, Give 5 cartons per day via feeding tube (1 1/2 cartons at 8am and noon and 1 carton at 4pm and 8pm).  Flush with 46m of water before and after each feeding.  Provides 1775 calories, 74.5 g protein and 1807mfree water and meets 100% of nutritional needs.  Send bolus supplies., Disp: 1185 mL, Rfl: 3 .  ondansetron (ZOFRAN) 4 MG tablet, Take 1 tablet (4 mg total) by mouth every 8 (eight) hours as needed for nausea or vomiting., Disp: 30 tablet, Rfl: 0 .  ondansetron (ZOFRAN) 8 MG tablet, Take 1 tablet (8 mg total) by mouth 2 (two) times daily as needed. Start on the third day after chemotherapy., Disp: 30 tablet, Rfl: 1 .  oxyCODONE (OXYCONTIN) 10 mg 12 hr tablet, Take 1 tablet (10 mg total) by mouth every 12 (twelve) hours., Disp: 60 tablet, Rfl: 0 .  Oxycodone HCl 10 MG TABS, Take 1 tablet (10 mg total) by mouth every 4 (four) hours as needed., Disp: 90 tablet, Rfl: 0 .  OXYGEN, Inhale 3 L into the lungs daily., Disp: , Rfl:  .  pantoprazole (PROTONIX) 20 MG tablet, Take 1 tablet (20 mg total) by mouth daily., Disp: 30 tablet, Rfl: 2 .  potassium & sodium phosphates (PHOS-NAK) 280-160-250 MG PACK, Take 1 packet by mouth 2 (two) times daily. For hypophosphatemia. Give via PEG., Disp: 120 each, Rfl: 0 .  prochlorperazine (COMPAZINE) 10 MG tablet, Take 1 tablet (10 mg total) by mouth every 6 (six) hours as needed (Nausea or vomiting)., Disp: 30 tablet, Rfl: 1 .  STIOLTO RESPIMAT 2.5-2.5 MCG/ACT AERS, Inhale 2 puffs into the lungs 2 (two) times daily as needed (shortness of breath). , Disp: , Rfl:  No current facility-administered medications for this visit.  Facility-Administered Medications Ordered in Other Visits:  .  sodium chloride 0.9 % 1,000 mL with magnesium sulfate 2 g infusion, , Intravenous, Once, RaSindy GuadeloupeMD  Physical exam:  Vitals:   05/07/19 0850  BP: 90/66  Pulse: 85  Resp: 16   Temp: (!) 96.1 F (35.6 C)  TempSrc: Tympanic  Weight: 98 lb 6.4 oz (44.6 kg)   Physical Exam HENT:     Head: Normocephalic and atraumatic.  Eyes:     Pupils: Pupils  are equal, round, and reactive to light.  Cardiovascular:     Rate and Rhythm: Normal rate and regular rhythm.     Heart sounds: Normal heart sounds.  Pulmonary:     Effort: Pulmonary effort is normal.     Breath sounds: Normal breath sounds.  Abdominal:     General: Bowel sounds are normal.     Palpations: Abdomen is soft.  Musculoskeletal:     Cervical back: Normal range of motion.  Skin:    General: Skin is warm and dry.  Neurological:     Mental Status: He is alert and oriented to person, place, and time.      CMP Latest Ref Rng & Units 05/03/2019  Glucose 70 - 99 mg/dL 158(H)  BUN 8 - 23 mg/dL 24(H)  Creatinine 0.61 - 1.24 mg/dL 0.57(L)  Sodium 135 - 145 mmol/L 129(L)  Potassium 3.5 - 5.1 mmol/L 5.1  Chloride 98 - 111 mmol/L 85(L)  CO2 22 - 32 mmol/L 33(H)  Calcium 8.9 - 10.3 mg/dL 8.0(L)  Total Protein 6.5 - 8.1 g/dL 5.8(L)  Total Bilirubin 0.3 - 1.2 mg/dL 0.5  Alkaline Phos 38 - 126 U/L 45  AST 15 - 41 U/L 17  ALT 0 - 44 U/L 16   CBC Latest Ref Rng & Units 05/07/2019  WBC 4.0 - 10.5 K/uL 5.6  Hemoglobin 13.0 - 17.0 g/dL 9.4(L)  Hematocrit 39.0 - 52.0 % 28.4(L)  Platelets 150 - 400 K/uL 173    No images are attached to the encounter.  IR Gastrostomy Tube  Result Date: 04/26/2019 INDICATION: Laryngeal CA, ongoing chemotherapy and radiation EXAM: FLUOROSCOPIC 20 FRENCH PULL-THROUGH GASTROSTOMY Date:  04/26/2019 04/26/2019 9:58 am Radiologist:  M. Daryll Brod, MD Guidance:  Ultrasound and fluoroscopic MEDICATIONS: Ancef 2 g; Antibiotics were administered within 1 hour of the procedure. Glucagon 0.5 mg IV ANESTHESIA/SEDATION: Versed 3.0 mg IV; Fentanyl 100 mcg IV Moderate Sedation Time:  12 minutes The patient was continuously monitored during the procedure by the interventional radiology nurse under my  direct supervision. CONTRAST:  93m VISIPAQUE IODIXANOL 320 MG/ML IV SOLN - administered into the gastric lumen. FLUOROSCOPY TIME:  Fluoroscopy Time: 2 minutes 16 seconds (8.8 mGy). COMPLICATIONS: None immediate. PROCEDURE: Informed consent was obtained from the patient following explanation of the procedure, risks, benefits and alternatives. The patient understands, agrees and consents for the procedure. All questions were addressed. A time out was performed. Maximal barrier sterile technique utilized including caps, mask, sterile gowns, sterile gloves, large sterile drape, hand hygiene, and betadine prep. The left upper quadrant was sterilely prepped and draped. An oral gastric catheter was inserted into the stomach under fluoroscopy. The existing nasogastric feeding tube was removed. Air was injected into the stomach for insufflation and visualization under fluoroscopy. The air distended stomach was confirmed beneath the anterior abdominal wall in the frontal and lateral projections. Under sterile conditions and local anesthesia, a 129gauge trocar needle was utilized to access the stomach percutaneously beneath the left subcostal margin. Needle position was confirmed within the stomach under biplane fluoroscopy. Contrast injection confirmed position also. A single T tack was deployed for gastropexy. Over an Amplatz guide wire, a 9-French sheath was inserted into the stomach. A snare device was utilized to capture the oral gastric catheter. The snare device was pulled retrograde from the stomach up the esophagus and out the oropharynx. The 20-French pull-through gastrostomy was connected to the snare device and pulled antegrade through the oropharynx down the esophagus into the stomach and  then through the percutaneous tract external to the patient. The gastrostomy was assembled externally. Contrast injection confirms position in the stomach. Images were obtained for documentation. The patient tolerated procedure  well. No immediate complication. IMPRESSION: Fluoroscopic insertion of a 20-French "pull-through" gastrostomy. Electronically Signed   By: Jerilynn Mages.  Shick M.D.   On: 04/26/2019 10:37     Assessment and plan- Patient is a 69 y.o. male withsquamous cell carcinoma of the supraglottic larynx stage IVA cT4a cN1cM0.  He is here for on treatment assessment prior to cycle 6 of weekly cisplatin chemotherapy  1.  Patient is significantly fatigued today and although his counts are okay to receive chemo I will hold chemotherapy this week and reassess him next week.  Radiation will also be on hold this week.  2.  Chemo-induced anemia: Recent iron studies B12 and folate were normal.  Continue to monitor  3.  Hypomagnesemia: He will receive IV magnesium with 1 L of IV fluids today.  He will also come for more IV fluids later this week.  4.  Neoplasm related pain: Continue OxyContin and as needed oxycodone  5.  Protein calorie malnutrition: Patient will hopefully be at his goal of tube feeds over the next 1 week.  He continues to have significant weight loss and his weight is down to 98 pounds from a prior value of 107 pounds   Visit Diagnosis 1. Squamous cell carcinoma of larynx (HCC)   2. Severe protein-calorie malnutrition ([place])   3. Neoplasm related pain   4. Hypomagnesemia      Dr. Randa Evens, MD, MPH [facility] at [facility] [phone] 05/07/2019 8:31 AM

## 2019-05-07 NOTE — Progress Notes (Signed)
Pt wants something to help appetite, only able to get 3 cans of feeding in a day, sore mouth from bad teeth, back and stomach hurting. Hard to swallow senna and wants something else for stool softner. Has not got his long acting pain meds- will check into it. No energy.

## 2019-05-07 NOTE — Progress Notes (Addendum)
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  Patient receiving concurrent chemotherapy and radiation therapy. PEG placed on 4/8.   Met with patient and girlfriend, [name] after MD visit.  [name] is getting in 3 full cartons of osmolite 1.5 per day (1 carton TID).  Has not been able to take senna orally but has been taking clearlax.  Has not had bowel movement this is day 4.    Oral intake is limited  Medications: Nursing checking on liquid senna, phos-nak  Labs: Phosphorus 3.6, Mag 1.6, K 4.4, Na 131, glucose 142  Anthropometrics:   Weight 98 lb 6.4 oz today decreased from 107 lb on 4/12  Estimated Energy Needs  Kcals: 1500-1750 Protein: 75-88 g Fluid: > 1.5 L  NUTRITION DIAGNOSIS: Inadequate oral intake continues    INTERVENTION:  [name] to add 1/2 carton daily to feeding until reaches (2 cartons at first feeding, 2 cartons at 2nd feeding and 1 carton at last feeding). Continue water flush and flush with medications.  Eat orally as able RN checking on senna in liquid version so patient will take senna and clearlax for constipation.  [name] reports this regimen worked last time.   Recommend to continue to monitor refeeding labs as patient still increasing tube feeding.  [name] has RD's contact information    MONITORING, EVALUATION, GOAL: Patient will consume adequate calories and protein to prevent weight loss   NEXT VISIT: April 26 during infusion   B. , RD, LDN Registered Dietitian [phone] (pager)     

## 2019-05-07 NOTE — Progress Notes (Signed)
Per Herb Grays CMA per Dr. Janese Banks, no Cisplatin treatment today.  Per Judeen Hammans RN per Dr. Janese Banks pt to receive 4g IV Magnesium in 1000cc of NS over two hours.

## 2019-05-08 ENCOUNTER — Ambulatory Visit: Payer: Medicare Other

## 2019-05-08 ENCOUNTER — Ambulatory Visit: Admission: RE | Admit: 2019-05-08 | Payer: Medicare Other | Source: Ambulatory Visit

## 2019-05-09 ENCOUNTER — Ambulatory Visit (INDEPENDENT_AMBULATORY_CARE_PROVIDER_SITE_OTHER): Payer: Medicare Other | Admitting: Pharmacist

## 2019-05-09 ENCOUNTER — Ambulatory Visit: Payer: Medicare Other

## 2019-05-09 ENCOUNTER — Telehealth: Payer: Self-pay | Admitting: Adult Health Nurse Practitioner

## 2019-05-09 DIAGNOSIS — C61 Malignant neoplasm of prostate: Secondary | ICD-10-CM | POA: Diagnosis not present

## 2019-05-09 DIAGNOSIS — J449 Chronic obstructive pulmonary disease, unspecified: Secondary | ICD-10-CM

## 2019-05-09 NOTE — Telephone Encounter (Signed)
Called to set up appt.  Spoke with patient and scheduled visit for 05/11/19 at 12pm Darrek Leasure K. Olena Heckle NP

## 2019-05-09 NOTE — Patient Instructions (Signed)
Visit Information  Goals Addressed            This Visit's Progress     Patient Stated   . Pharm D "I want to take care of myself" (pt-stated)       Current Barriers:  . Polypharmacy; complex patient with multiple comorbidities including COPD, tobacco abuse, prostate cancer, s/p tx for Hep C; current tx for laryngeal cancer. Patient notes today significant mouth pain and weight loss. Using PEG tube for nutiriton . Palliative care team [name], NP previously involved. Appears last visit was in 02/2019 . Notes that his finance, [name], has been helping manage his medications at home  . Self-manages medications" o Squamous cell carcinoma of larynx: seen by [facility] pulm/ENT but following w/ [facility] center currently - Pain: oxycodone ER 10 mg BID, oxycodone IR 10 mg up to Q4H, though patient reports he is taking 15 mg Q6H. Total daily dose oxycodone 80 mg - Bowel regimen: senokot syrup PRN - Nausea: ondansetron PRN, promethazine PRN o COPD/tobacco abuse: Stiolto daily, Duonebs or Combivent as needed. Follows w/ Dr. Camillo Flaming pulmonary. Notes he was having a difficult time recently getting Duonebs refilled at pharmacy. Appropriate refills on file o S/p tx Hep C: s/p Mayvret. o Anxiety: clonazepam 1 mg BID;   Pharmacist Clinical Goal(s):  [name] Over the next 90 days, patient will work with PharmD and provider towards optimized medication management  Interventions: . Comprehensive medication review performed, medication list updated in electronic medical record . Inter-disciplinary care team collaboration (see longitudinal plan of care) . Will route note to palliative NP to see if patient is appropriate for continued services with her . Will route note to Dr. Janese Banks for consideration of titration of opioid regimen, given patient's significant use of PRN oxycodone.  Patient Self Care Activities:  . Patient will take medications as prescribed  Please see past updates related to this goal by  clicking on the "Past Updates" button in the selected goal          Patient verbalizes understanding of instructions provided today.   Plan:  - Scheduled f/u call in ~9 weeks  [name], PharmD, [place] [phone]

## 2019-05-09 NOTE — Chronic Care Management (AMB) (Signed)
Chronic Care Management   Follow Up Note   05/09/2019 Name: Kevin Nelson MRN: [id number] DOB: [birth year]  Referred by: Kevin Lick, NP Reason for referral : Chronic Care Management (Medication Management)   Kevin Nelson is a 69 y.o. year old male who is a primary care patient of Kevin Nelson, Kevin Faster, NP. The CCM team was consulted for assistance with chronic disease management and care coordination needs.    Contacted patient for medication management review. He noted that his mouth sores were hurting badly today, so he wasn't up for talking for long.   Review of patient status, including review of consultants reports, relevant laboratory and other test results, and collaboration with appropriate care team members and the patient's provider was performed as part of comprehensive patient evaluation and provision of chronic care management services.    SDOH (Social Determinants of Health) assessments performed: No See Care Plan activities for detailed interventions related to [facility])     Outpatient Encounter Medications as of 05/09/2019  Medication Sig Note  . ipratropium-albuterol (DUONEB) 0.5-2.5 (3) MG/3ML SOLN Take 3 mLs by nebulization every 6 (six) hours as needed. 05/09/2019: Using 3-4 times daily  . oxyCODONE (OXYCONTIN) 10 mg 12 hr tablet Take 1 tablet (10 mg total) by mouth every 12 (twelve) hours.   . Oxycodone HCl 10 MG TABS Take 1 tablet (10 mg total) by mouth every 4 (four) hours as needed. 05/09/2019: 15 mg QID  . STIOLTO RESPIMAT 2.5-2.5 MCG/ACT AERS Inhale 2 puffs into the lungs 2 (two) times daily as needed (shortness of breath).    . clonazePAM (KLONOPIN) 1 MG tablet Take 1 tablet (1 mg total) by mouth 2 (two) times daily as needed for anxiety.   . COMBIVENT RESPIMAT 20-100 MCG/ACT AERS respimat INHALE 1 PUFF BY MOUTH INTO THE LUNGS EVERY 6 HOURS AS NEEDED   . dexamethasone (DECADRON) 4 MG tablet Take 2 tablets by mouth once a day on the day after chemotherapy and then take  2 tablets two times a day for 2 days. Take with food.   . fluticasone (FLONASE) 50 MCG/ACT nasal spray SHAKE LIQUID AND USE 1 SPRAY IN EACH NOSTRIL TWICE DAILY   . lidocaine (XYLOCAINE) 2 % solution Use as directed 15 mLs in the mouth or throat every 6 (six) hours as needed for mouth pain (for sore throat).   [name] lidocaine-prilocaine (EMLA) cream Apply to affected area once   . Nutritional Supplements (FEEDING SUPPLEMENT, OSMOLITE 1.5 CAL,) LIQD Give 5 cartons per day via feeding tube (1 1/2 cartons at 8am and noon and 1 carton at 4pm and 8pm).  Flush with 4ml of water before and after each feeding.  Provides 1775 calories, 74.5 g protein and 1833ml free water and meets 100% of nutritional needs.  Send bolus supplies.   [name] ondansetron (ZOFRAN) 4 MG tablet Take 1 tablet (4 mg total) by mouth every 8 (eight) hours as needed for nausea or vomiting.   . ondansetron (ZOFRAN) 8 MG tablet Take 1 tablet (8 mg total) by mouth 2 (two) times daily as needed. Start on the third day after chemotherapy.   . OXYGEN Inhale 3 L into the lungs daily.   . pantoprazole (PROTONIX) 20 MG tablet Take 1 tablet (20 mg total) by mouth daily.   . polyethylene glycol powder (CLEARLAX) 17 GM/SCOOP powder Take 1 Container by mouth once.   . potassium & sodium phosphates (PHOS-NAK) 280-160-250 MG PACK Take 1 packet by mouth 2 (two) times daily. For hypophosphatemia.  Give via PEG.   . prochlorperazine (COMPAZINE) 10 MG tablet Take 1 tablet (10 mg total) by mouth every 6 (six) hours as needed (Nausea or vomiting). (Patient not taking: Reported on 05/07/2019)   . sennosides (SENOKOT) 8.8 MG/5ML syrup Take 5 mLs by mouth at bedtime.    No facility-administered encounter medications on file as of 05/09/2019.     Objective:   Goals Addressed            This Visit's Progress     Patient Stated   . Pharm D "I want to take care of myself" (pt-stated)       Current Barriers:  . Polypharmacy; complex patient with multiple  comorbidities including COPD, tobacco abuse, prostate cancer, s/p tx for Hep C; current tx for laryngeal cancer. Patient notes today significant mouth pain and weight loss. Using PEG tube for nutiriton . Palliative care team Kevin Ben, NP previously involved. Appears last visit was in 02/2019 . Notes that his finance, Kevin Nelson, has been helping manage his medications at home  . Self-manages medications" o Squamous cell carcinoma of larynx: seen by [facility] pulm/ENT but following w/ [facility] currently - Pain: oxycodone ER 10 mg BID, oxycodone IR 10 mg up to Q4H, though patient reports he is taking 15 mg Q6H. Total daily dose oxycodone 80 mg - Bowel regimen: senokot syrup PRN - Nausea: ondansetron PRN, promethazine PRN o COPD/tobacco abuse: Stiolto daily, Duonebs or Combivent as needed. Follows w/ Dr. Camillo Nelson pulmonary. Notes he was having a difficult time recently getting Duonebs refilled at pharmacy. Appropriate refills on file o S/p tx Hep C: s/p Mayvret. o Anxiety: clonazepam 1 mg BID;   Pharmacist Clinical Goal(s):  [name] Over the next 90 days, patient will work with PharmD and provider towards optimized medication management  Interventions: . Comprehensive medication review performed, medication list updated in electronic medical record . Inter-disciplinary care team collaboration (see longitudinal plan of care) . Will route note to palliative NP to see if patient is appropriate for continued services with her . Will route note to Dr. Janese Nelson for consideration of titration of opioid regimen, given patient's significant use of PRN oxycodone.  Patient Self Care Activities:  . Patient will take medications as prescribed  Please see past updates related to this goal by clicking on the "Past Updates" button in the selected goal           Plan:  - Scheduled f/u call in ~9 weeks  Kevin Nelson, PharmD, Kevin Nelson [phone]

## 2019-05-10 ENCOUNTER — Inpatient Hospital Stay: Payer: Medicare Other

## 2019-05-10 ENCOUNTER — Ambulatory Visit: Payer: Medicare Other

## 2019-05-10 ENCOUNTER — Other Ambulatory Visit: Payer: Self-pay

## 2019-05-10 VITALS — BP 94/60 | HR 100 | Temp 97.0°F | Resp 19

## 2019-05-10 DIAGNOSIS — C329 Malignant neoplasm of larynx, unspecified: Secondary | ICD-10-CM

## 2019-05-10 DIAGNOSIS — C321 Malignant neoplasm of supraglottis: Secondary | ICD-10-CM | POA: Diagnosis not present

## 2019-05-10 DIAGNOSIS — Z5111 Encounter for antineoplastic chemotherapy: Secondary | ICD-10-CM | POA: Diagnosis not present

## 2019-05-10 DIAGNOSIS — G8918 Other acute postprocedural pain: Secondary | ICD-10-CM | POA: Diagnosis not present

## 2019-05-10 MED ORDER — PROCHLORPERAZINE EDISYLATE 10 MG/2ML IJ SOLN
10.0000 mg | Freq: Once | INTRAMUSCULAR | Status: AC
  Administered 2019-05-10: 10 mg via INTRAVENOUS
  Filled 2019-05-10: qty 2

## 2019-05-10 MED ORDER — HEPARIN SOD (PORK) LOCK FLUSH 100 UNIT/ML IV SOLN
INTRAVENOUS | Status: AC
  Filled 2019-05-10: qty 5

## 2019-05-10 MED ORDER — SODIUM CHLORIDE 0.9 % IV SOLN
Freq: Once | INTRAVENOUS | Status: AC
  Filled 2019-05-10: qty 1000

## 2019-05-10 MED ORDER — SODIUM CHLORIDE 0.9 % IV SOLN
Freq: Once | INTRAVENOUS | Status: AC
  Filled 2019-05-10: qty 250

## 2019-05-10 MED ORDER — PROCHLORPERAZINE EDISYLATE 10 MG/2ML IJ SOLN: 10.0000 mg | Freq: Once | INTRAMUSCULAR | Status: AC

## 2019-05-10 NOTE — Progress Notes (Signed)
Pt expressed to RN that he felt nauseated. MD notified. Per MD to give pt Compazine 10 mg IV once at this time. Pt updated, VSS.   Shanna Un [employer]

## 2019-05-11 ENCOUNTER — Ambulatory Visit: Payer: Medicare Other

## 2019-05-11 ENCOUNTER — Other Ambulatory Visit: Payer: Medicare Other | Admitting: Adult Health Nurse Practitioner

## 2019-05-11 DIAGNOSIS — C329 Malignant neoplasm of larynx, unspecified: Secondary | ICD-10-CM

## 2019-05-11 DIAGNOSIS — Z515 Encounter for palliative care: Secondary | ICD-10-CM

## 2019-05-11 NOTE — Progress Notes (Signed)
[facility] Consult Note Telephone: [phone]  Fax: [phone]  PATIENT NAME: Kevin Nelson DOB: October 25, 1950 MRN: [id number]  PRIMARY CARE PROVIDER:   [name], NP  REFERRING PROVIDER:  [name], NP [address] [place],  [place] [address]  RESPONSIBLE PARTY:   [place], [place]      RECOMMENDATIONS and PLAN:  1.  Advanced care planning. Listed as DNR in Epic  2.  Squamous cell carcinoma of larynx. Patient has been undergoing radiation and chemo.  Has had increased difficulty swallowing. He has lost weight down to 98 pounds.  He was getting weaker due to the malnutrition and a PEG tube has been place.  He is still able to swallow liquids and take his pills orally.  He is encouraged to take in 2 containers of supplemental nutrition 3-4 times a day.  He has difficulty getting 1 1/2 containers at a time.  This much along with flushes makes him feel very uncomfortable and nauseous.  He is only doing 1 1/2 containers 2-3 times a day.  Have encouraged only doing one container at a time and starting with doing that 4 times a day and increasing it to 5 times a day to see if this will get more nutrition in and not make him feel so uncomfortable.   He has had to miss some chemo and radiation sessions due to weakness and pain.  He is on schedule to finish with both in about 3 weeks.  Main concern right now is getting enough nutrition.  Patient is willing to try smaller more frequent feedings.  Continue follow up and recommendations by oncology.  3.  Pain.  Patient currently takes oxycontin 10 mg BID and has oxycodone 10 mg Q4 hrs PRN for breakthrough pain.  He tries not to take too much of the oxycodone but at times his fiancee will state that he waits too long to take anything and the pain will get severe.  She has encouraged him to take the oxy when he starts feeling the pain before it gets severe.  I  have also encouraged him to take the oxy to stay ahead of the pain before it gets too severe.  He states he does not like having to take too many pills and have endorsed that staying ahead of the pain may mean he does not have to take as many pills as the pain will be better controlled.  Will continue to assess.   Palliative care will continue to monitor for symptom management and make recommendations as needed.  Next appointment in 4 weeks.  Encouraged to call with any concerns prior to that.  I spent 90 minutes providing this consultation,  from 12:00 to 1:30 including time with patient/family, chart review, provider coordination, and documentation. More than 50% of the time in this consultation was spent coordinating communication.   HISTORY OF PRESENT ILLNESS:  Kevin Nelson is a 69 y.o. year old male with multiple medical problems including severe stage 3 COPD,larynx cancer, prostate cancer, skin cancer, CAD, chronic hepatitis. Palliative Care was asked to help address goals of care.   CODE STATUS: see above  PPS: 60% HOSPICE ELIGIBILITY/DIAGNOSIS: TBD  PHYSICAL EXAM:   General: NAD, frail appearing, thin Abdomen: PEG tube in place with no erythema, drainage, or swelling noted GU: no suprapubic tenderness Extremities: trace edema to feet and ankles, no joint deformities Skin: no rashes on exposed skin  Neurological: Weakness but otherwise nonfocal   PAST MEDICAL HISTORY:  Past Medical History:  Diagnosis Date  . Anxiety   . Basal cell carcinoma   . Cigarette smoker   . Dental decay   . Dysphagia   . Dyspnea   . End stage COPD ([place])    end stage copd, emphysema  . Erectile dysfunction   . Headache    every morning  . Hepatitis C    treated for 8 weeks this past year with Mayvret  . History of nonmelanoma skin cancer   . Hoarseness, chronic   . Myocardial infarction [facility]) march/april 2020   mild heart attack  . Prostate cancer ([place])   . Respiratory failure, acute ([place])      was inpt for 5 days. thought he was going to die.  . Sleep apnea   . Squamous cell skin cancer   . Throat cancer ([place])   . Use of leuprolide acetate (Lupron)    treated for prostate cancer.   . 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 Smoking status: Current Every Day Smoker    Packs/day: 0.25    Years: 52.00    Pack years: 13.00    Types: Cigarettes  . Smokeless tobacco: Never Used  . Tobacco comment: 5 cigarettes a day  Substance Use Topics  . Alcohol use: Not Currently    Alcohol/week: 0.0 standard drinks    ALLERGIES: No Known Allergies   PERTINENT MEDICATIONS:  Outpatient Encounter Medications as of 05/11/2019  Medication Sig  . clonazePAM (KLONOPIN) 1 MG tablet Take 1 tablet (1 mg total) by mouth 2 (two) times daily as needed for anxiety.  . COMBIVENT RESPIMAT 20-100 MCG/ACT AERS respimat INHALE 1 PUFF BY MOUTH INTO THE LUNGS EVERY 6 HOURS AS NEEDED  . dexamethasone (DECADRON) 4 MG tablet Take 2 tablets by mouth once a day on the day after chemotherapy and then take 2 tablets two times a day for 2 days. Take with food.  . fluticasone (FLONASE) 50 MCG/ACT nasal spray SHAKE LIQUID AND USE 1 SPRAY IN EACH NOSTRIL TWICE DAILY  . ipratropium-albuterol (DUONEB) 0.5-2.5 (3) MG/3ML SOLN Take 3 mLs by nebulization every 6 (six) hours as needed.  . lidocaine (XYLOCAINE) 2 % solution Use as directed 15 mLs in the mouth or throat every 6 (six) hours as needed for mouth pain (for sore throat).  [name] lidocaine-prilocaine (EMLA) cream Apply to affected area once  . Nutritional Supplements (FEEDING SUPPLEMENT, OSMOLITE 1.5 CAL,) LIQD Give 5 cartons per day via feeding tube (1 1/2 cartons at 8am and noon and 1 carton at 4pm and 8pm).  Flush with 72ml of water before and after each feeding.  Provides 1775 calories, 74.5 g protein and 1842ml free water and meets 100% of nutritional needs.  Send bolus supplies.  [name] ondansetron (ZOFRAN) 4  MG tablet Take 1 tablet (4 mg total) by mouth every 8 (eight) hours as needed for nausea or vomiting.  . ondansetron (ZOFRAN) 8 MG tablet Take 1 tablet (8 mg total) by mouth 2 (two) times daily as needed. Start on the third day after chemotherapy.  [name] oxyCODONE (OXYCONTIN) 10 mg 12 hr tablet Take 1 tablet (10 mg total) by mouth every 12 (twelve) hours.  . Oxycodone HCl 10 MG TABS Take 1 tablet (10 mg total) by mouth every 4 (four) hours as needed.  . OXYGEN Inhale 3  L into the lungs daily.  . pantoprazole (PROTONIX) 20 MG tablet Take 1 tablet (20 mg total) by mouth daily.  . polyethylene glycol powder (CLEARLAX) 17 GM/SCOOP powder Take 1 Container by mouth once.  . potassium & sodium phosphates (PHOS-NAK) 280-160-250 MG PACK Take 1 packet by mouth 2 (two) times daily. For hypophosphatemia. Give via PEG.  . prochlorperazine (COMPAZINE) 10 MG tablet Take 1 tablet (10 mg total) by mouth every 6 (six) hours as needed (Nausea or vomiting). (Patient not taking: Reported on 05/07/2019)  . sennosides (SENOKOT) 8.8 MG/5ML syrup Take 5 mLs by mouth at bedtime.  [name] STIOLTO RESPIMAT 2.5-2.5 MCG/ACT AERS Inhale 2 puffs into the lungs 2 (two) times daily as needed (shortness of breath).    No facility-administered encounter medications on file as of 05/11/2019.     Shonice Wrisley Jenetta Downer, NP

## 2019-05-14 ENCOUNTER — Other Ambulatory Visit: Payer: Self-pay

## 2019-05-14 ENCOUNTER — Ambulatory Visit: Payer: Medicare Other

## 2019-05-14 ENCOUNTER — Encounter: Payer: Self-pay | Admitting: Oncology

## 2019-05-14 ENCOUNTER — Inpatient Hospital Stay: Payer: Medicare Other

## 2019-05-14 ENCOUNTER — Inpatient Hospital Stay (HOSPITAL_BASED_OUTPATIENT_CLINIC_OR_DEPARTMENT_OTHER): Payer: Medicare Other | Admitting: Oncology

## 2019-05-14 VITALS — BP 106/58 | HR 85 | Temp 97.6°F | Wt 97.5 lb

## 2019-05-14 DIAGNOSIS — G8918 Other acute postprocedural pain: Secondary | ICD-10-CM | POA: Diagnosis not present

## 2019-05-14 DIAGNOSIS — E43 Unspecified severe protein-calorie malnutrition: Secondary | ICD-10-CM | POA: Diagnosis not present

## 2019-05-14 DIAGNOSIS — T451X5A Adverse effect of antineoplastic and immunosuppressive drugs, initial encounter: Secondary | ICD-10-CM

## 2019-05-14 DIAGNOSIS — D6481 Anemia due to antineoplastic chemotherapy: Secondary | ICD-10-CM | POA: Diagnosis not present

## 2019-05-14 DIAGNOSIS — R110 Nausea: Secondary | ICD-10-CM | POA: Diagnosis not present

## 2019-05-14 DIAGNOSIS — T402X5A Adverse effect of other opioids, initial encounter: Secondary | ICD-10-CM

## 2019-05-14 DIAGNOSIS — G893 Neoplasm related pain (acute) (chronic): Secondary | ICD-10-CM | POA: Diagnosis not present

## 2019-05-14 DIAGNOSIS — Z5111 Encounter for antineoplastic chemotherapy: Secondary | ICD-10-CM | POA: Diagnosis not present

## 2019-05-14 DIAGNOSIS — C321 Malignant neoplasm of supraglottis: Secondary | ICD-10-CM | POA: Diagnosis not present

## 2019-05-14 DIAGNOSIS — C329 Malignant neoplasm of larynx, unspecified: Secondary | ICD-10-CM

## 2019-05-14 DIAGNOSIS — K5903 Drug induced constipation: Secondary | ICD-10-CM

## 2019-05-14 LAB — COMPREHENSIVE METABOLIC PANEL
ALT: 24 U/L (ref 0–44)
AST: 27 U/L (ref 15–41)
Albumin: 3.3 g/dL — ABNORMAL LOW (ref 3.5–5.0)
Alkaline Phosphatase: 47 U/L (ref 38–126)
Anion gap: 8 (ref 5–15)
BUN: 16 mg/dL (ref 8–23)
CO2: 33 mmol/L — ABNORMAL HIGH (ref 22–32)
Calcium: 8.5 mg/dL — ABNORMAL LOW (ref 8.9–10.3)
Chloride: 91 mmol/L — ABNORMAL LOW (ref 98–111)
Creatinine, Ser: 0.66 mg/dL (ref 0.61–1.24)
GFR calc Af Amer: >60 mL/min (ref >60)
GFR calc non Af Amer: >60 mL/min (ref >60)
Glucose, Bld: 150 mg/dL — ABNORMAL HIGH (ref 70–99)
Potassium: 4.6 mmol/L (ref 3.5–5.1)
Sodium: 132 mmol/L — ABNORMAL LOW (ref 135–145)
Total Bilirubin: 0.5 mg/dL (ref 0.3–1.2)
Total Protein: 6 g/dL — ABNORMAL LOW (ref 6.5–8.1)

## 2019-05-14 LAB — CBC WITH DIFFERENTIAL/PLATELET
Abs Immature Granulocytes: 0.01 10*3/uL (ref 0.00–0.07)
Basophils Absolute: 0 10*3/uL (ref 0.0–0.1)
Basophils Relative: 1 %
Eosinophils Absolute: 0 10*3/uL (ref 0.0–0.5)
Eosinophils Relative: 1 %
HCT: 25.1 % — ABNORMAL LOW (ref 39.0–52.0)
Hemoglobin: 8.2 g/dL — ABNORMAL LOW (ref 13.0–17.0)
Immature Granulocytes: 0 %
Lymphocytes Relative: 12 %
Lymphs Abs: 0.4 10*3/uL — ABNORMAL LOW (ref 0.7–4.0)
MCH: 28.1 pg (ref 26.0–34.0)
MCHC: 32.7 g/dL (ref 30.0–36.0)
MCV: 86 fL (ref 80.0–100.0)
Monocytes Absolute: 0.4 10*3/uL (ref 0.1–1.0)
Monocytes Relative: 13 %
Neutro Abs: 2.4 10*3/uL (ref 1.7–7.7)
Neutrophils Relative %: 73 %
Platelets: 232 10*3/uL (ref 150–400)
RBC: 2.92 MIL/uL — ABNORMAL LOW (ref 4.22–5.81)
RDW: 15.3 % (ref 11.5–15.5)
WBC: 3.3 10*3/uL — ABNORMAL LOW (ref 4.0–10.5)
nRBC: 0 % (ref 0.0–0.2)

## 2019-05-14 LAB — MAGNESIUM: Magnesium: 1.7 mg/dL (ref 1.7–2.4)

## 2019-05-14 LAB — PHOSPHORUS: Phosphorus: 3.4 mg/dL (ref 2.5–4.6)

## 2019-05-14 MED ORDER — SODIUM CHLORIDE 0.9% FLUSH
10.0000 mL | Freq: Once | INTRAVENOUS | Status: AC
  Administered 2019-05-14: 10 mL via INTRAVENOUS
  Filled 2019-05-14: qty 10

## 2019-05-14 MED ORDER — NALOXEGOL OXALATE 25 MG PO TABS: 25.0000 mg | ORAL_TABLET | Freq: Every day | ORAL | 0 refills | Status: DC

## 2019-05-14 MED ORDER — METOCLOPRAMIDE HCL 5 MG/5ML PO SOLN: 10.0000 mg | Freq: Three times a day (TID) | ORAL | 0 refills | Status: DC

## 2019-05-14 MED ORDER — HEPARIN SOD (PORK) LOCK FLUSH 100 UNIT/ML IV SOLN
500.0000 [IU] | Freq: Once | INTRAVENOUS | Status: AC
  Administered 2019-05-14: 500 [IU] via INTRAVENOUS
  Filled 2019-05-14: qty 5

## 2019-05-14 MED ORDER — DEXAMETHASONE 4 MG PO TABS: 8.0000 mg | ORAL_TABLET | Freq: Every day | ORAL | 0 refills | Status: DC

## 2019-05-14 NOTE — Progress Notes (Signed)
Patient here for oncology follow-up appointment,states he feels "terrible and doesn't know how long he can live like this". Complains of uncontrolled nausea and constipation. Asks for a refill for zofran. Also states he is unable to consume 2 bottles of nutrition through feeding tube.

## 2019-05-14 NOTE — Progress Notes (Signed)
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  Patient receiving concurrent chemotherapy and radiation therapy.  PEG placed on 4/8.    Met with patient and girlfriend, Kevin Nelson after MD visit.  Kevin Nelson reports patient has had continued issues with constipation and nausea this week.  She is giving 3 full cartons of osmolite 1.5 via tube (1 carton TID).  She says that she can't get anymore in due to all the other medications that she needs to get in him during the day he gets full.  Reports that Dr Janese Banks is adjusting nausea and constipation medications.     Medications: reviewed  Labs: Na 132, glucose 150, Mag 1.7  Anthropometrics:   Weight 97 lb today decreased from 98 lb 6.4 oz .   107 lb on 3/8  Estimated Energy Needs  Kcals: 1500-1750 Protein: 75-88 g Fluid: > 1.5 L  NUTRITION DIAGNOSIS: Inadequate oral intake continues   INTERVENTION:  Spoke with Dr. Janese Banks and adding reglan and movantik to help with symptoms.   Goal would be for patient to add 1/2 carton of tube feeding until 5 cartons reached per day.  Patient wants to do feeding TID and does not feel like he can tolerate 4 feedings per day.  Discussed options for getting nutrition in to prevent continued weight loss.  RD discussed option of continuous tube feeding with patient and Kevin Nelson.  Patient does not want to switch to continuous as this time.   RD to obtain samples of Dillard [place] 1.4 formula (higher calorie, lower volume) for patient to try. Patient and Chong Sicilian know how to contact RD    MONITORING, EVALUATION, GOAL: Patient will utilize tube feeding to prevent further weight loss   NEXT VISIT: May 3rd in infusion  Laritza Vokes B. Zenia Resides, [place], [place] Registered Dietitian [phone] (pager)

## 2019-05-15 ENCOUNTER — Ambulatory Visit
Admission: RE | Admit: 2019-05-15 | Discharge: 2019-05-15 | Disposition: A | Discharge status: Home / Self Care | Payer: Medicare Other | Source: Ambulatory Visit | Attending: Radiation Oncology | Admitting: Radiation Oncology

## 2019-05-15 DIAGNOSIS — J029 Acute pharyngitis, unspecified: Secondary | ICD-10-CM | POA: Diagnosis not present

## 2019-05-15 DIAGNOSIS — F419 Anxiety disorder, unspecified: Secondary | ICD-10-CM | POA: Diagnosis not present

## 2019-05-15 DIAGNOSIS — R1310 Dysphagia, unspecified: Secondary | ICD-10-CM | POA: Diagnosis not present

## 2019-05-15 DIAGNOSIS — C329 Malignant neoplasm of larynx, unspecified: Secondary | ICD-10-CM | POA: Diagnosis not present

## 2019-05-15 DIAGNOSIS — R634 Abnormal weight loss: Secondary | ICD-10-CM | POA: Diagnosis not present

## 2019-05-15 DIAGNOSIS — C61 Malignant neoplasm of prostate: Secondary | ICD-10-CM | POA: Diagnosis not present

## 2019-05-15 NOTE — Progress Notes (Signed)
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 Richardson Landry with symptoms of difficulty swallowing and persistent sore throat which has been ongoing for the last 1 year.NPL exam showed afriable mass involving the false vocal cord but did not involve the true vocal cords. True vocal cords remain mobile on exam. Hypopharynx and tongue base was clear.   This was followed by CT soft tissue neck which showed a 3.3 x 1.7 x 3.5 cm mass which was thought to be involving both the true and false vocal cords and extending anteriorly to involve the anterior commissure. Mass extends to involve the paraglottic space and erodes through the thyroid cartilage anteriorly and left of midline. There is also invasion of the preepiglottic space inferiorly. Enlarged cystic necrotic level 3/4 lymph node measuring 1.6 x 1.2 cm. Findings are consistent with nodal metastatic disease. Biopsy of the mass showed squamous cell carcinoma.  PET/CT showed hyppermetabolic mass extending from left glottis to anterior commisure. Solitary left level 2 metastatic Lymph node. No distant metastatic disease  Patient met with [facility] and decided against total laryngectomy.  Plan is for concurrent chemo/RT +/-adjuvantchemotherapy following chemo/RT   Interval history-patient still reports not being able to take 5 cartons of tube feeds daily.  He is just about taking 3 cartons of tube feedings.  Continues to lose weight.  Reports having problems with constipation and MiraLAX and senna is not helping.  Also he has to take MiraLAX with more fluids which makes him feel full easily.  He is still taking OxyContin and oxycodone which is controlling his pain to some extent but not completely.  ECOG PS- 2 Pain scale- 6 Opioid associated constipation- yes  Review of systems- Review of Systems  Constitutional: Positive for malaise/fatigue and weight loss. Negative for chills and fever.  HENT: Negative for congestion, ear discharge and nosebleeds.   Eyes: Negative for blurred vision.  Respiratory: Negative for cough, hemoptysis, sputum production, shortness of breath and wheezing.   Cardiovascular: Negative for chest pain, palpitations, orthopnea and claudication.  Gastrointestinal: Positive for constipation and nausea. Negative for abdominal pain, blood in stool, diarrhea, heartburn, melena and vomiting.  Genitourinary: Negative for dysuria, flank pain, frequency, hematuria and urgency.  Musculoskeletal: Negative for back pain, joint pain and myalgias.  Skin: Negative for rash.  Neurological: Negative for dizziness, tingling, focal weakness, seizures, weakness and headaches.  Endo/Heme/Allergies: Does not bruise/bleed easily.  Psychiatric/Behavioral: Negative for depression and suicidal ideas. The patient does not have insomnia.        No Known Allergies   Past Medical History:  Diagnosis Date  . Anxiety   . Basal cell carcinoma   . Cigarette smoker   . Dental decay   . Dysphagia   . Dyspnea   . End stage COPD ([place])    end  stage copd, emphysema  . Erectile dysfunction   . Headache    every morning  . Hepatitis C    treated for 8 weeks this past year  with Mayvret  . History of nonmelanoma skin cancer   . Hoarseness, chronic   . Myocardial infarction [facility]) march/april 2020   mild heart attack  . Prostate cancer ([place])   . Respiratory failure, acute ([place])    was inpt for 5 days. thought he was going to die.  . Sleep apnea   . Squamous cell skin cancer   . Throat cancer ([place])   . Use of leuprolide acetate (Lupron)    treated for prostate cancer.   . 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     Past Surgical History:  Procedure Laterality Date  . FACIAL LACERATION REPAIR    . HERNIA REPAIR Left 1992   inguinal  . IR GASTROSTOMY TUBE MOD SED  04/26/2019  . MICROLARYNGOSCOPY N/A 01/17/2019   Procedure: MICROLARYNGOSCOPY WITH BX;  Surgeon: Clyde Canterbury, MD;  Location: [facility] ORS;  Service: ENT;  Laterality: N/A;  . PORTA CATH INSERTION N/A 03/15/2019   Procedure: PORTA CATH INSERTION;  Surgeon: Algernon Huxley, MD;  Location: [place] [facility];  Service: Cardiovascular;  Laterality: N/A;  . PROSTATE BIOPSY    . SKIN CANCER EXCISION    . TRIGGER FINGER RELEASE Left 10/2018    Social History   Socioeconomic History  . Marital status: Significant Other    Spouse name: [name], girlfriend  . Number of children: Not on file  . Years of education: Not on file  . Highest education level: Not on file  Occupational History  . Occupation: Chief Strategy Officer    Comment: retired  Tobacco Use  . Smoking status: Current Every Day Smoker    Packs/day: 0.25    Years: 52.00    Pack years: 13.00    Types: Cigarettes  . Smokeless tobacco: Never Used  . Tobacco comment: 5 cigarettes a day  Substance and Sexual Activity  . Alcohol use: Not Currently    Alcohol/week: 0.0 standard drinks  . Drug use: Never  . Sexual activity: Yes  Other Topics Concern  . Not on file  Social History Narrative   Patient has retired d/t respiratory status. Currently lives with girlfriend    Social Determinants of Health   Financial Resource Strain:  Low Risk   . Difficulty of Paying Living Expenses: Not hard at all  Food Insecurity: No Food Insecurity  . Worried About Charity fundraiser in the Last Year: Never true  . Ran Out of Food in the Last Year: Never true  Transportation Needs: No Transportation Needs  . Lack of Transportation (Medical): No  . Lack of Transportation (Non-Medical): No  Physical Activity: Inactive  . Days of Exercise per Week: 0 days  . Minutes of Exercise per Session: 0 min  Stress: Stress Concern Present  . Feeling of Stress : Very much  Social Connections:   . Frequency of Communication with Friends and Family:   . Frequency of Social Gatherings with Friends and Family:   . Attends Religious Services:   . Active Member of Clubs or Organizations:   . Attends Archivist Meetings:   [name] Marital Status:   Intimate Partner Violence:   . Fear of Current or Ex-Partner:   . Emotionally Abused:   [name] Physically Abused:   . Sexually Abused:     Family History  Problem Relation Age of Onset  . Cancer Sister   . Cancer - Cervical Mother   . Heart attack Brother   . Heart attack Maternal Grandfather      Current Outpatient Medications:  .  clonazePAM (KLONOPIN) 1 MG tablet, Take 1 tablet (1 mg total) by mouth 2 (two) times daily as needed for anxiety., Disp: 60 tablet, Rfl: 2 .  COMBIVENT RESPIMAT 20-100 MCG/ACT AERS respimat, INHALE 1 PUFF BY MOUTH INTO THE LUNGS EVERY 6 HOURS AS NEEDED, Disp: 4 g, Rfl: 0 .  dexamethasone (DECADRON) 4 MG tablet, Take 2 tablets by mouth once a day on the day after chemotherapy and then take 2 tablets two times a day for 2 days. Take with food., Disp: 30 tablet, Rfl: 1 .  fluticasone (FLONASE) 50 MCG/ACT nasal spray, SHAKE LIQUID AND USE 1 SPRAY IN EACH NOSTRIL TWICE DAILY, Disp: 16 g, Rfl: 1 .  ipratropium-albuterol (DUONEB) 0.5-2.5 (3) MG/3ML SOLN, Take 3 mLs by nebulization every 6 (six) hours as needed., Disp: 360 mL, Rfl: 11 .  lidocaine (XYLOCAINE) 2 % solution,  Use as directed 15 mLs in the mouth or throat every 6 (six) hours as needed for mouth pain (for sore throat)., Disp: 100 mL, Rfl: 2 .  lidocaine-prilocaine (EMLA) cream, Apply to affected area once, Disp: 30 g, Rfl: 3 .  Nutritional Supplements (FEEDING SUPPLEMENT, OSMOLITE 1.5 CAL,) LIQD, Give 5 cartons per day via feeding tube (1 1/2 cartons at 8am and noon and 1 carton at 4pm and 8pm).  Flush with 68m of water before and after each feeding.  Provides 1775 calories, 74.5 g protein and 18075mfree water and meets 100% of nutritional needs.  Send bolus supplies., Disp: 1185 mL, Rfl: 3 .  ondansetron (ZOFRAN) 4 MG tablet, Take 1 tablet (4 mg total) by mouth every 8 (eight) hours as needed for nausea or vomiting., Disp: 30 tablet, Rfl: 0 .  ondansetron (ZOFRAN) 8 MG tablet, Take 1 tablet (8 mg total) by mouth 2 (two) times daily as needed. Start on the third day after chemotherapy., Disp: 30 tablet, Rfl: 1 .  oxyCODONE (OXYCONTIN) 10 mg 12 hr tablet, Take 1 tablet (10 mg total) by mouth every 12 (twelve) hours., Disp: 60 tablet, Rfl: 0 .  Oxycodone HCl 10 MG TABS, Take 1 tablet (10 mg total) by mouth every 4 (four) hours as needed., Disp: 90 tablet, Rfl: 0 .  OXYGEN, Inhale 3 L into the lungs daily., Disp: , Rfl:  .  polyethylene glycol powder (CLEARLAX) 17 GM/SCOOP powder, Take 1 Container by mouth once., Disp: , Rfl:  .  potassium & sodium phosphates (PHOS-NAK) 280-160-250 MG PACK, Take 1 packet by mouth 2 (two) times daily. For hypophosphatemia. Give via PEG., Disp: 120 each, Rfl: 0 .  sennosides (SENOKOT) 8.8 MG/5ML syrup, Take 5 mLs by mouth at bedtime., Disp: 240 mL, Rfl: 3 .  STIOLTO RESPIMAT 2.5-2.5 MCG/ACT AERS, Inhale 2 puffs into the lungs 2 (two) times daily as needed (shortness of breath). , Disp: , Rfl:  .  dexamethasone (DECADRON) 4 MG tablet, Take 2 tablets (8 mg total) by mouth daily. With food every day x 1 week, Disp: 7 tablet, Rfl: 0 .  metoCLOPramide (REGLAN) 5 MG/5ML solution, Take  10 mLs (10 mg total) by mouth 3 (three) times daily before meals., Disp: 473 mL, Rfl: 0 .  naloxegol oxalate (MOVANTIK) 25 MG TABS tablet, Take 1 tablet (25 mg total) by mouth daily., Disp: 30 tablet, Rfl: 0 .  pantoprazole (PROTONIX) 20 MG tablet, Take 1 tablet (20 mg total) by mouth daily. (Patient not taking: Reported on 05/14/2019), Disp: 30 tablet, Rfl: 2 .  prochlorperazine (COMPAZINE) 10 MG tablet, Take 1 tablet (10 mg total) by mouth every 6 (six) hours as needed (Nausea or vomiting). (Patient not taking: Reported on 05/07/2019), Disp: 30 tablet, Rfl: 1  Physical exam:  Vitals:   05/14/19 0920  BP: (!) 106/58  Pulse: 85  Temp: 97.6 F (36.4 C)  TempSrc: Oral  SpO2: 96%  Weight: 97 lb 8 oz (44.2 kg)   Physical Exam Constitutional:      General: He is not in acute distress. HENT:     Head: Normocephalic and atraumatic.  Eyes:     Pupils: Pupils are equal, round, and reactive to light.  Cardiovascular:     Rate and Rhythm: Normal rate and regular rhythm.     Heart sounds: Normal heart sounds.  Pulmonary:     Effort: Pulmonary effort is normal.     Breath sounds: Normal breath sounds.  Abdominal:     General: Bowel sounds are normal.     Palpations: Abdomen is soft.  Musculoskeletal:     Cervical back: Normal range of motion.  Skin:    General: Skin is warm and dry.  Neurological:     Mental Status: He is alert and oriented to person, place, and time.      CMP Latest Ref Rng & Units 05/14/2019  Glucose 70 - 99 mg/dL 150(H)  BUN 8 - 23 mg/dL 16  Creatinine 0.61 - 1.24 mg/dL 0.66  Sodium 135 - 145 mmol/L 132(L)  Potassium 3.5 - 5.1 mmol/L 4.6  Chloride 98 - 111 mmol/L 91(L)  CO2 22 - 32 mmol/L 33(H)  Calcium 8.9 - 10.3 mg/dL 8.5(L)  Total Protein 6.5 - 8.1 g/dL 6.0(L)  Total Bilirubin 0.3 - 1.2 mg/dL 0.5  Alkaline Phos 38 - 126 U/L 47  AST 15 - 41 U/L 27  ALT 0 - 44 U/L 24   CBC Latest Ref Rng & Units 05/14/2019  WBC 4.0 - 10.5 K/uL 3.3(L)  Hemoglobin 13.0 -  17.0 g/dL 8.2(L)  Hematocrit 39.0 - 52.0 % 25.1(L)  Platelets 150 - 400 K/uL 232    No images are attached to the encounter.  IR Gastrostomy Tube  Result Date: 04/26/2019 INDICATION: Laryngeal CA, ongoing chemotherapy and radiation EXAM: FLUOROSCOPIC 20 FRENCH PULL-THROUGH GASTROSTOMY Date:  04/26/2019 04/26/2019 9:58 am Radiologist:  M. Daryll Brod, MD Guidance:  Ultrasound and fluoroscopic MEDICATIONS: Ancef 2 g; Antibiotics were administered within 1 hour of the procedure. Glucagon 0.5 mg IV ANESTHESIA/SEDATION: Versed 3.0 mg IV; Fentanyl 100 mcg IV Moderate Sedation Time:  12 minutes The patient was continuously monitored during the procedure by the interventional radiology nurse under my direct supervision. CONTRAST:  58m VISIPAQUE IODIXANOL 320 MG/ML IV SOLN - administered into the gastric lumen. FLUOROSCOPY TIME:  Fluoroscopy Time: 2 minutes 16 seconds (8.8 mGy). COMPLICATIONS: None immediate. PROCEDURE: Informed consent was obtained from the patient following explanation of the procedure, risks, benefits and alternatives. The patient understands, agrees and consents for the procedure. All questions were addressed. A time out was performed. Maximal barrier sterile technique utilized including caps, mask, sterile gowns, sterile gloves, large sterile drape, hand hygiene, and betadine prep. The left upper quadrant was sterilely prepped and draped. An oral gastric catheter was inserted into the stomach under fluoroscopy. The existing nasogastric feeding tube was removed. Air was injected into the stomach for insufflation  and visualization under fluoroscopy. The air distended stomach was confirmed beneath the anterior abdominal wall in the frontal and lateral projections. Under sterile conditions and local anesthesia, a 74 gauge trocar needle was utilized to access the stomach percutaneously beneath the left subcostal margin. Needle position was confirmed within the stomach under biplane fluoroscopy.  Contrast injection confirmed position also. A single T tack was deployed for gastropexy. Over an Amplatz guide wire, a 9-French sheath was inserted into the stomach. A snare device was utilized to capture the oral gastric catheter. The snare device was pulled retrograde from the stomach up the esophagus and out the oropharynx. The 20-French pull-through gastrostomy was connected to the snare device and pulled antegrade through the oropharynx down the esophagus into the stomach and then through the percutaneous tract external to the patient. The gastrostomy was assembled externally. Contrast injection confirms position in the stomach. Images were obtained for documentation. The patient tolerated procedure well. No immediate complication. IMPRESSION: Fluoroscopic insertion of a 20-French "pull-through" gastrostomy. Electronically Signed   By: Jerilynn Mages.  Shick M.D.   On: 04/26/2019 10:37     Assessment and plan- Patient is a 69 y.o. male  withsquamous cell carcinoma of the supraglottic larynx stage IVA cT4a cN1cM0.    He is here for on treatment assessment prior to cycle 6 of weekly cisplatin chemotherapy  1.  Patient has not received chemotherapy for 2 weeks now.  He also received a break from radiation treatment all of last week.  However he has still not bounced back to his baseline.  Continues to feel significantly fatigued.  He is not at target with his tube feedings and continues to lose weight.  He is more anemic today with a hemoglobin of 8.2.  I will therefore hold his chemotherapy today and this week.  Patient does not want to receive radiation treatment today but is amenable to starting it tomorrow.  2.  Chemo induced anemia: Recent ferritin and iron studies were within normal limits suggestive of chemo induced anemia.  Continue to monitor chemo on hold as above  3.  Neoplasm related pain: I am not increasing his OxyContin and oxycodone just yet as he is having significant constipation which needs to be  addressed prior  4.  Opioid-induced constipation:I will stop his Zofran which can cause constipation and switch him to Movantik 25 mg daily.  He can hold off on taking MiraLAX and senna and see if Movantik works.  If it does not work it may be reasonable to restart senna at that time.  I will reassess his symptoms next week  5.  Chemo-induced nausea: DC Zofran and switch him to Reglan liquid 10 mg 3 times a day to assist with gastric emptying as well as reduce constipation.  6.  Severe protein calorie malnutrition: Medication changes as above.  Velta Addison also continues to follow him.  7.  We will bring him in for IV fluids later this week.  He does not wish to get IV fluids today  Visit Diagnosis 1. Chemotherapy-induced nausea   2. Neoplasm related pain   3. Antineoplastic chemotherapy induced anemia   4. Therapeutic opioid induced constipation   5. Severe protein-calorie malnutrition Altamease Oiler: less than 60% of standard weight) ([place])      Dr. Randa Evens, MD, MPH [facility] [phone] 05/15/2019 9:05 AM

## 2019-05-16 ENCOUNTER — Ambulatory Visit
Admission: RE | Admit: 2019-05-16 | Discharge: 2019-05-16 | Disposition: A | Discharge status: Home / Self Care | Payer: Medicare Other | Source: Ambulatory Visit | Attending: Radiation Oncology | Admitting: Radiation Oncology

## 2019-05-16 ENCOUNTER — Other Ambulatory Visit: Payer: Self-pay | Admitting: *Deleted

## 2019-05-16 DIAGNOSIS — R1310 Dysphagia, unspecified: Secondary | ICD-10-CM | POA: Diagnosis not present

## 2019-05-16 DIAGNOSIS — R634 Abnormal weight loss: Secondary | ICD-10-CM | POA: Diagnosis not present

## 2019-05-16 DIAGNOSIS — C329 Malignant neoplasm of larynx, unspecified: Secondary | ICD-10-CM

## 2019-05-16 DIAGNOSIS — J029 Acute pharyngitis, unspecified: Secondary | ICD-10-CM | POA: Diagnosis not present

## 2019-05-16 DIAGNOSIS — F419 Anxiety disorder, unspecified: Secondary | ICD-10-CM | POA: Diagnosis not present

## 2019-05-16 DIAGNOSIS — C61 Malignant neoplasm of prostate: Secondary | ICD-10-CM | POA: Diagnosis not present

## 2019-05-16 MED ORDER — OXYCODONE HCL 10 MG PO TABS: 10.0000 mg | ORAL_TABLET | ORAL | 0 refills | Status: DC | PRN

## 2019-05-17 ENCOUNTER — Other Ambulatory Visit: Payer: Self-pay

## 2019-05-17 ENCOUNTER — Ambulatory Visit
Admission: RE | Admit: 2019-05-17 | Discharge: 2019-05-17 | Disposition: A | Discharge status: Home / Self Care | Payer: Medicare Other | Source: Ambulatory Visit | Attending: Radiation Oncology | Admitting: Radiation Oncology

## 2019-05-17 ENCOUNTER — Inpatient Hospital Stay: Payer: Medicare Other

## 2019-05-17 DIAGNOSIS — J029 Acute pharyngitis, unspecified: Secondary | ICD-10-CM | POA: Diagnosis not present

## 2019-05-17 DIAGNOSIS — R1310 Dysphagia, unspecified: Secondary | ICD-10-CM | POA: Diagnosis not present

## 2019-05-17 DIAGNOSIS — C329 Malignant neoplasm of larynx, unspecified: Secondary | ICD-10-CM | POA: Diagnosis not present

## 2019-05-17 DIAGNOSIS — R634 Abnormal weight loss: Secondary | ICD-10-CM | POA: Diagnosis not present

## 2019-05-17 DIAGNOSIS — C61 Malignant neoplasm of prostate: Secondary | ICD-10-CM | POA: Diagnosis not present

## 2019-05-17 DIAGNOSIS — F419 Anxiety disorder, unspecified: Secondary | ICD-10-CM | POA: Diagnosis not present

## 2019-05-17 NOTE — Progress Notes (Signed)
Pt verbalized that he was eating and drinking, and did not want IVFs at this time. Dr. Janese Banks aware, NNO. Pt stable at discharge.

## 2019-05-18 ENCOUNTER — Ambulatory Visit
Admission: RE | Admit: 2019-05-18 | Discharge: 2019-05-18 | Disposition: A | Discharge status: Home / Self Care | Payer: Medicare Other | Source: Ambulatory Visit | Attending: Radiation Oncology | Admitting: Radiation Oncology

## 2019-05-18 ENCOUNTER — Other Ambulatory Visit: Payer: Self-pay | Admitting: Pulmonary Disease

## 2019-05-18 DIAGNOSIS — R634 Abnormal weight loss: Secondary | ICD-10-CM | POA: Diagnosis not present

## 2019-05-18 DIAGNOSIS — C329 Malignant neoplasm of larynx, unspecified: Secondary | ICD-10-CM | POA: Diagnosis not present

## 2019-05-18 DIAGNOSIS — R1310 Dysphagia, unspecified: Secondary | ICD-10-CM | POA: Diagnosis not present

## 2019-05-18 DIAGNOSIS — F419 Anxiety disorder, unspecified: Secondary | ICD-10-CM | POA: Diagnosis not present

## 2019-05-18 DIAGNOSIS — J029 Acute pharyngitis, unspecified: Secondary | ICD-10-CM | POA: Diagnosis not present

## 2019-05-18 DIAGNOSIS — C61 Malignant neoplasm of prostate: Secondary | ICD-10-CM | POA: Diagnosis not present

## 2019-05-21 ENCOUNTER — Inpatient Hospital Stay: Payer: Medicare Other

## 2019-05-21 ENCOUNTER — Ambulatory Visit: Payer: Medicare Other

## 2019-05-21 ENCOUNTER — Inpatient Hospital Stay (HOSPITAL_BASED_OUTPATIENT_CLINIC_OR_DEPARTMENT_OTHER): Payer: Medicare Other | Admitting: Oncology

## 2019-05-21 ENCOUNTER — Encounter: Payer: Self-pay | Admitting: Oncology

## 2019-05-21 ENCOUNTER — Telehealth: Payer: Self-pay

## 2019-05-21 ENCOUNTER — Other Ambulatory Visit: Payer: Self-pay

## 2019-05-21 ENCOUNTER — Ambulatory Visit
Admission: RE | Admit: 2019-05-21 | Discharge: 2019-05-21 | Disposition: A | Discharge status: Home / Self Care | Payer: Medicare Other | Source: Ambulatory Visit | Attending: Radiation Oncology | Admitting: Radiation Oncology

## 2019-05-21 ENCOUNTER — Inpatient Hospital Stay: Payer: Medicare Other | Attending: Oncology

## 2019-05-21 VITALS — BP 106/50 | HR 102 | Temp 95.7°F | Resp 16 | Wt 95.7 lb

## 2019-05-21 DIAGNOSIS — C329 Malignant neoplasm of larynx, unspecified: Secondary | ICD-10-CM

## 2019-05-21 DIAGNOSIS — Z85820 Personal history of malignant melanoma of skin: Secondary | ICD-10-CM | POA: Diagnosis not present

## 2019-05-21 DIAGNOSIS — Z8042 Family history of malignant neoplasm of prostate: Secondary | ICD-10-CM | POA: Diagnosis not present

## 2019-05-21 DIAGNOSIS — F329 Major depressive disorder, single episode, unspecified: Secondary | ICD-10-CM | POA: Diagnosis not present

## 2019-05-21 DIAGNOSIS — F17210 Nicotine dependence, cigarettes, uncomplicated: Secondary | ICD-10-CM | POA: Diagnosis not present

## 2019-05-21 DIAGNOSIS — J029 Acute pharyngitis, unspecified: Secondary | ICD-10-CM | POA: Insufficient documentation

## 2019-05-21 DIAGNOSIS — Z85828 Personal history of other malignant neoplasm of skin: Secondary | ICD-10-CM | POA: Insufficient documentation

## 2019-05-21 DIAGNOSIS — G4730 Sleep apnea, unspecified: Secondary | ICD-10-CM | POA: Insufficient documentation

## 2019-05-21 DIAGNOSIS — R5383 Other fatigue: Secondary | ICD-10-CM | POA: Diagnosis not present

## 2019-05-21 DIAGNOSIS — G893 Neoplasm related pain (acute) (chronic): Secondary | ICD-10-CM | POA: Insufficient documentation

## 2019-05-21 DIAGNOSIS — I252 Old myocardial infarction: Secondary | ICD-10-CM | POA: Diagnosis not present

## 2019-05-21 DIAGNOSIS — K5909 Other constipation: Secondary | ICD-10-CM | POA: Diagnosis not present

## 2019-05-21 DIAGNOSIS — E871 Hypo-osmolality and hyponatremia: Secondary | ICD-10-CM | POA: Diagnosis not present

## 2019-05-21 DIAGNOSIS — R110 Nausea: Secondary | ICD-10-CM | POA: Insufficient documentation

## 2019-05-21 DIAGNOSIS — Z9981 Dependence on supplemental oxygen: Secondary | ICD-10-CM | POA: Diagnosis not present

## 2019-05-21 DIAGNOSIS — Z95828 Presence of other vascular implants and grafts: Secondary | ICD-10-CM

## 2019-05-21 DIAGNOSIS — Z79899 Other long term (current) drug therapy: Secondary | ICD-10-CM | POA: Diagnosis not present

## 2019-05-21 DIAGNOSIS — R634 Abnormal weight loss: Secondary | ICD-10-CM | POA: Insufficient documentation

## 2019-05-21 DIAGNOSIS — C61 Malignant neoplasm of prostate: Secondary | ICD-10-CM | POA: Insufficient documentation

## 2019-05-21 DIAGNOSIS — C321 Malignant neoplasm of supraglottis: Secondary | ICD-10-CM | POA: Insufficient documentation

## 2019-05-21 DIAGNOSIS — R1310 Dysphagia, unspecified: Secondary | ICD-10-CM | POA: Diagnosis not present

## 2019-05-21 DIAGNOSIS — D649 Anemia, unspecified: Secondary | ICD-10-CM | POA: Diagnosis not present

## 2019-05-21 DIAGNOSIS — J449 Chronic obstructive pulmonary disease, unspecified: Secondary | ICD-10-CM | POA: Diagnosis not present

## 2019-05-21 DIAGNOSIS — F419 Anxiety disorder, unspecified: Secondary | ICD-10-CM | POA: Insufficient documentation

## 2019-05-21 DIAGNOSIS — E878 Other disorders of electrolyte and fluid balance, not elsewhere classified: Secondary | ICD-10-CM | POA: Diagnosis not present

## 2019-05-21 DIAGNOSIS — E43 Unspecified severe protein-calorie malnutrition: Secondary | ICD-10-CM

## 2019-05-21 DIAGNOSIS — Z931 Gastrostomy status: Secondary | ICD-10-CM | POA: Diagnosis not present

## 2019-05-21 LAB — CBC WITH DIFFERENTIAL/PLATELET
Abs Immature Granulocytes: 0.02 10*3/uL (ref 0.00–0.07)
Basophils Absolute: 0 10*3/uL (ref 0.0–0.1)
Basophils Relative: 0 %
Eosinophils Absolute: 0 10*3/uL (ref 0.0–0.5)
Eosinophils Relative: 1 %
HCT: 25.8 % — ABNORMAL LOW (ref 39.0–52.0)
Hemoglobin: 8.6 g/dL — ABNORMAL LOW (ref 13.0–17.0)
Immature Granulocytes: 0 %
Lymphocytes Relative: 20 %
Lymphs Abs: 0.9 10*3/uL (ref 0.7–4.0)
MCH: 28.9 pg (ref 26.0–34.0)
MCHC: 33.3 g/dL (ref 30.0–36.0)
MCV: 86.6 fL (ref 80.0–100.0)
Monocytes Absolute: 0.6 10*3/uL (ref 0.1–1.0)
Monocytes Relative: 12 %
Neutro Abs: 3.2 10*3/uL (ref 1.7–7.7)
Neutrophils Relative %: 67 %
Platelets: 203 10*3/uL (ref 150–400)
RBC: 2.98 MIL/uL — ABNORMAL LOW (ref 4.22–5.81)
RDW: 17 % — ABNORMAL HIGH (ref 11.5–15.5)
WBC: 4.8 10*3/uL (ref 4.0–10.5)
nRBC: 0 % (ref 0.0–0.2)

## 2019-05-21 LAB — COMPREHENSIVE METABOLIC PANEL
ALT: 16 U/L (ref 0–44)
AST: 16 U/L (ref 15–41)
Albumin: 3.6 g/dL (ref 3.5–5.0)
Alkaline Phosphatase: 50 U/L (ref 38–126)
Anion gap: 7 (ref 5–15)
BUN: 22 mg/dL (ref 8–23)
CO2: 33 mmol/L — ABNORMAL HIGH (ref 22–32)
Calcium: 8.5 mg/dL — ABNORMAL LOW (ref 8.9–10.3)
Chloride: 92 mmol/L — ABNORMAL LOW (ref 98–111)
Creatinine, Ser: 0.53 mg/dL — ABNORMAL LOW (ref 0.61–1.24)
GFR calc Af Amer: >60 mL/min (ref >60)
GFR calc non Af Amer: >60 mL/min (ref >60)
Glucose, Bld: 138 mg/dL — ABNORMAL HIGH (ref 70–99)
Potassium: 3.8 mmol/L (ref 3.5–5.1)
Sodium: 132 mmol/L — ABNORMAL LOW (ref 135–145)
Total Bilirubin: 0.5 mg/dL (ref 0.3–1.2)
Total Protein: 6.3 g/dL — ABNORMAL LOW (ref 6.5–8.1)

## 2019-05-21 LAB — PHOSPHORUS: Phosphorus: 3 mg/dL (ref 2.5–4.6)

## 2019-05-21 LAB — MAGNESIUM: Magnesium: 1.8 mg/dL (ref 1.7–2.4)

## 2019-05-21 MED ORDER — SODIUM CHLORIDE 0.9% FLUSH
10.0000 mL | INTRAVENOUS | Status: DC | PRN
  Administered 2019-05-21: 10 mL via INTRAVENOUS
  Filled 2019-05-21: qty 10

## 2019-05-21 MED ORDER — HEPARIN SOD (PORK) LOCK FLUSH 100 UNIT/ML IV SOLN
500.0000 [IU] | Freq: Once | INTRAVENOUS | Status: AC
  Administered 2019-05-21: 500 [IU] via INTRAVENOUS
  Filled 2019-05-21: qty 5

## 2019-05-21 NOTE — Progress Notes (Signed)
Patient here for oncology follow-up appointment, expresses complaints of nausea at this time. He states he doesn't think he wants chemo today. Medications are making him ill.

## 2019-05-21 NOTE — Telephone Encounter (Signed)
Nutrition  Phone call received from [place] patient's girlfriend.  She states that when they got home ~11:30 she gave him 1 carton of [employer] and right after she gave it to him he went to throw up.  She says that he has not had any nausea medications all day today.  Noted patient complained of nausea to nurse when in the clinic this am at 8:30am.    Recommend to [name] to give nausea medications  (zofran q 8 hour, compazine q 6 hours).  She says reglan made him sick.   Reduce water flush to 60 ml before and after and give 1/2 carton of formula q 2 hours.   Encouraged regular bowel movement.   Savera Donson B. Zenia Resides, [place], [place] Registered Dietitian [phone] (pager)

## 2019-05-21 NOTE — Progress Notes (Signed)
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  Patient receiving concurrent chemotherapy and radiation therapy.  PEG placed on 4/8.   Met with patient and girlfriend [name] in clinic.  Patient not taking chemotherapy today.  [name] says that reglan and movantik did not help and made nausea worse.  Reports last bowel movement yesterday.  [name] reports getting in about 3 full cartons of osmolite 1.5 a day.  Patient ready to go to radiation.     Medications: reviewed  Labs: reviewed  Anthropometrics:   Weight 95 lb today decreased from 97 lb on 4/26 107 lb on 3/8   Estimated Energy Needs  Kcals: 1500-1750 Protein: 75-88 g Fluid: > 1.5 L  NUTRITION DIAGNOSIS: Inadequate oral intake continues   INTERVENTION:  RD provided samples of [employer] 1.4 formula (higher calories) for patient to try in place of osmolite 1.5.  Will need to provide 3.5-4 cartons of [employer] 1.4 vs 5 cartons of osmolite 1.5 to meet nutritional needs. Patient will need to continue 90 ml of water before and after to better meet hydration needs.  Will provide 1592-1820 calories, 70-80 g protein and 1538m -16582mfree water Instructed patient to call RD after trying [place] report on tolerance.   Patient has contact information    MONITORING, EVALUATION, GOAL: weight trends, intake, TF tolerance   NEXT VISIT: May 13 during infusion  Ajamu Maxon B. AlZenia ResidesRDKearneyLDSanfordegistered Dietitian [phone])

## 2019-05-22 ENCOUNTER — Ambulatory Visit: Payer: Medicare Other

## 2019-05-22 ENCOUNTER — Ambulatory Visit
Admission: RE | Admit: 2019-05-22 | Discharge: 2019-05-22 | Disposition: A | Discharge status: Home / Self Care | Payer: Medicare Other | Source: Ambulatory Visit | Attending: Radiation Oncology | Admitting: Radiation Oncology

## 2019-05-22 DIAGNOSIS — R634 Abnormal weight loss: Secondary | ICD-10-CM | POA: Diagnosis not present

## 2019-05-22 DIAGNOSIS — C329 Malignant neoplasm of larynx, unspecified: Secondary | ICD-10-CM | POA: Diagnosis not present

## 2019-05-22 DIAGNOSIS — F419 Anxiety disorder, unspecified: Secondary | ICD-10-CM | POA: Diagnosis not present

## 2019-05-22 DIAGNOSIS — R1310 Dysphagia, unspecified: Secondary | ICD-10-CM | POA: Diagnosis not present

## 2019-05-22 DIAGNOSIS — C61 Malignant neoplasm of prostate: Secondary | ICD-10-CM | POA: Diagnosis not present

## 2019-05-22 DIAGNOSIS — J029 Acute pharyngitis, unspecified: Secondary | ICD-10-CM | POA: Diagnosis not present

## 2019-05-22 NOTE — Progress Notes (Signed)
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 Richardson Landry with symptoms of difficulty swallowing and persistent sore throat which has been ongoing for the last 1 year.NPL exam showed afriable mass involving the false vocal cord but did not involve the true vocal cords. True vocal cords remain mobile on exam. Hypopharynx and tongue base was clear.   This was followed by CT soft tissue neck which showed a 3.3 x 1.7 x 3.5 cm mass which was thought to be involving both the true and false vocal cords and extending anteriorly to involve the anterior commissure. Mass extends to involve the paraglottic space and erodes through the thyroid cartilage anteriorly and left of midline. There is also invasion of the preepiglottic space inferiorly. Enlarged cystic necrotic level 3/4 lymph node measuring 1.6 x 1.2 cm. Findings are consistent with nodal metastatic disease. Biopsy of the mass showed squamous cell carcinoma.  PET/CT showed hyppermetabolic mass extending from left glottis to anterior commisure. Solitary left level 2 metastatic Lymph node. No distant metastatic disease  Patient met with [facility]NT and decided against total  laryngectomy. Plan is for concurrent chemo/RT +/-adjuvantchemotherapy following chemo/RT. patient seen 5 cycles of weekly cisplatin chemotherapy with the last dose which was given on 04/30/2019.  Chemotherapy has been on hold due to poor nutrition and ongoing fatigue  Interval history-continues to feel significantly fatigued.  He is only able to take in 3 cartons of tube feeds and his goal is 5.  Reglan did not help him with his nausea.  He feels that his nausea was worse because of that.  Also Movantik did not help him with his bowel movements.  Presently he is continuing as needed enemas and is able to move his bowels from that.  ECOG PS- 3 Pain scale- 3 Opioid associated constipation- no  Review of systems- Review of Systems  Constitutional: Positive for malaise/fatigue and weight loss. Negative for chills and fever.  HENT: Negative for congestion, ear discharge and nosebleeds.   Eyes: Negative for blurred vision.  Respiratory: Negative for cough, hemoptysis, sputum production, shortness of breath and wheezing.   Cardiovascular: Negative for chest pain, palpitations, orthopnea and claudication.  Gastrointestinal: Positive for nausea. Negative for abdominal pain, blood in stool, constipation, diarrhea, heartburn, melena and vomiting.       Pain during swallowing  Genitourinary: Negative for dysuria, flank pain, frequency, hematuria and urgency.  Musculoskeletal: Negative for back pain, joint pain and myalgias.  Skin: Negative for rash.  Neurological: Negative for dizziness, tingling, focal weakness, seizures, weakness and headaches.  Endo/Heme/Allergies: Does not bruise/bleed easily.  Psychiatric/Behavioral: Negative for depression and suicidal ideas. The patient does not have insomnia.        No Known Allergies   Past Medical History:  Diagnosis Date  . Anxiety   . Basal cell  carcinoma   . Cigarette smoker   . Dental decay   . Dysphagia   . Dyspnea   . End stage COPD ([place])      end stage copd, emphysema  . Erectile dysfunction   . Headache    every morning  . Hepatitis C    treated for 8 weeks this past year with Mayvret  . History of nonmelanoma skin cancer   . Hoarseness, chronic   . Myocardial infarction [facility]) march/april 2020   mild heart attack  . Prostate cancer ([place])   . Respiratory failure, acute ([place])    was inpt for 5 days. thought he was going to die.  . Sleep apnea   . Squamous cell skin cancer   . Throat cancer ([place])   . Use of leuprolide acetate (Lupron)    treated for prostate cancer.   . 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     Past Surgical History:  Procedure Laterality Date  . FACIAL LACERATION REPAIR    . HERNIA REPAIR Left 1992   inguinal  . IR GASTROSTOMY TUBE MOD SED  04/26/2019  . MICROLARYNGOSCOPY N/A 01/17/2019   Procedure: MICROLARYNGOSCOPY WITH BX;  Surgeon: Clyde Canterbury, MD;  Location: [facility] ORS;  Service: ENT;  Laterality: N/A;  . PORTA CATH INSERTION N/A 03/15/2019   Procedure: PORTA CATH INSERTION;  Surgeon: Algernon Huxley, MD;  Location: [place] CV LAB;  Service: Cardiovascular;  Laterality: N/A;  . PROSTATE BIOPSY    . SKIN CANCER EXCISION    . TRIGGER FINGER RELEASE Left 10/2018    Social History   Socioeconomic History  . Marital status: Significant Other    Spouse name: [name], girlfriend  . Number of children: Not on file  . Years of education: Not on file  . Highest education level: Not on file  Occupational History  . Occupation: Chief Strategy Officer    Comment: retired  Tobacco Use  . Smoking status: Current Every Day Smoker    Packs/day: 0.25    Years: 52.00    Pack years: 13.00    Types: Cigarettes  . Smokeless tobacco: Never Used  . Tobacco comment: 5 cigarettes a day  Substance and Sexual Activity  . Alcohol use: Not Currently    Alcohol/week: 0.0 standard drinks  . Drug use: Never  . Sexual activity: Yes  Other Topics Concern  . Not on file  Social History Narrative    Patient has retired d/t respiratory status. Currently lives with girlfriend    Social Determinants of Health   Financial Resource Strain: Low Risk   . Difficulty of Paying Living Expenses: Not hard at all  Food Insecurity: No Food Insecurity  . Worried About Charity fundraiser in the Last Year: Never true  . Ran Out of Food in the Last Year: Never true  Transportation Needs: No Transportation Needs  . Lack of Transportation (Medical): No  . Lack of Transportation (Non-Medical): No  Physical Activity: Inactive  . Days of Exercise per Week: 0 days  . Minutes of Exercise per Session: 0 min  Stress: Stress Concern Present  . Feeling of Stress : Very much  Social Connections:   . Frequency of Communication with Friends and Family:   . Frequency of Social Gatherings with Friends and Family:   . Attends Religious Services:   . Active Member of Clubs or Organizations:   . Attends Archivist Meetings:   [name] Marital Status:   Intimate  Partner Violence:   . Fear of Current or Ex-Partner:   . Emotionally Abused:   [name] Physically Abused:   . Sexually Abused:     Family History  Problem Relation Age of Onset  . Cancer Sister   . Cancer - Cervical Mother   . Heart attack Brother   . Heart attack Maternal Grandfather      Current Outpatient Medications:  .  clonazePAM (KLONOPIN) 1 MG tablet, Take 1 tablet (1 mg total) by mouth 2 (two) times daily as needed for anxiety., Disp: 60 tablet, Rfl: 2 .  COMBIVENT RESPIMAT 20-100 MCG/ACT AERS respimat, INHALE 1 PUFF BY MOUTH INTO THE LUNGS EVERY 6 HOURS AS NEEDED, Disp: 4 g, Rfl: 0 .  dexamethasone (DECADRON) 4 MG tablet, Take 2 tablets by mouth once a day on the day after chemotherapy and then take 2 tablets two times a day for 2 days. Take with food., Disp: 30 tablet, Rfl: 1 .  dexamethasone (DECADRON) 4 MG tablet, Take 2 tablets (8 mg total) by mouth daily. With food every day x 1 week, Disp: 7 tablet, Rfl: 0 .  fluticasone (FLONASE)  50 MCG/ACT nasal spray, SHAKE LIQUID AND USE 1 SPRAY IN EACH NOSTRIL TWICE DAILY, Disp: 16 g, Rfl: 1 .  ipratropium-albuterol (DUONEB) 0.5-2.5 (3) MG/3ML SOLN, Take 3 mLs by nebulization every 6 (six) hours as needed., Disp: 360 mL, Rfl: 11 .  lidocaine (XYLOCAINE) 2 % solution, Use as directed 15 mLs in the mouth or throat every 6 (six) hours as needed for mouth pain (for sore throat)., Disp: 100 mL, Rfl: 2 .  lidocaine-prilocaine (EMLA) cream, Apply to affected area once, Disp: 30 g, Rfl: 3 .  metoCLOPramide (REGLAN) 5 MG/5ML solution, Take 10 mLs (10 mg total) by mouth 3 (three) times daily before meals., Disp: 473 mL, Rfl: 0 .  naloxegol oxalate (MOVANTIK) 25 MG TABS tablet, Take 1 tablet (25 mg total) by mouth daily., Disp: 30 tablet, Rfl: 0 .  Nutritional Supplements (FEEDING SUPPLEMENT, OSMOLITE 1.5 CAL,) LIQD, Give 5 cartons per day via feeding tube (1 1/2 cartons at 8am and noon and 1 carton at 4pm and 8pm).  Flush with 87m of water before and after each feeding.  Provides 1775 calories, 74.5 g protein and 18036mfree water and meets 100% of nutritional needs.  Send bolus supplies., Disp: 1185 mL, Rfl: 3 .  ondansetron (ZOFRAN) 4 MG tablet, Take 1 tablet (4 mg total) by mouth every 8 (eight) hours as needed for nausea or vomiting., Disp: 30 tablet, Rfl: 0 .  ondansetron (ZOFRAN) 8 MG tablet, Take 1 tablet (8 mg total) by mouth 2 (two) times daily as needed. Start on the third day after chemotherapy., Disp: 30 tablet, Rfl: 1 .  oxyCODONE (OXYCONTIN) 10 mg 12 hr tablet, Take 1 tablet (10 mg total) by mouth every 12 (twelve) hours., Disp: 60 tablet, Rfl: 0 .  Oxycodone HCl 10 MG TABS, Take 1 tablet (10 mg total) by mouth every 4 (four) hours as needed., Disp: 90 tablet, Rfl: 0 .  OXYGEN, Inhale 3 L into the lungs daily., Disp: , Rfl:  .  pantoprazole (PROTONIX) 20 MG tablet, Take 1 tablet (20 mg total) by mouth daily. (Patient not taking: Reported on 05/14/2019), Disp: 30 tablet, Rfl: 2 .   polyethylene glycol powder (CLEARLAX) 17 GM/SCOOP powder, Take 1 Container by mouth once., Disp: , Rfl:  .  potassium & sodium phosphates (PHOS-NAK) 280-160-250 MG PACK, Take 1 packet by mouth 2 (  two) times daily. For hypophosphatemia. Give via PEG., Disp: 120 each, Rfl: 0 .  prochlorperazine (COMPAZINE) 10 MG tablet, Take 1 tablet (10 mg total) by mouth every 6 (six) hours as needed (Nausea or vomiting). (Patient not taking: Reported on 05/07/2019), Disp: 30 tablet, Rfl: 1 .  sennosides (SENOKOT) 8.8 MG/5ML syrup, Take 5 mLs by mouth at bedtime., Disp: 240 mL, Rfl: 3 .  STIOLTO RESPIMAT 2.5-2.5 MCG/ACT AERS, Inhale 2 puffs into the lungs 2 (two) times daily as needed (shortness of breath). , Disp: , Rfl:   Physical exam:  Vitals:   05/21/19 0841  BP: (!) 106/50  Pulse: (!) 102  Resp: 16  Temp: (!) 95.7 F (35.4 C)  TempSrc: Tympanic  SpO2: 90%  Weight: 95 lb 11.2 oz (43.4 kg)   Physical Exam Constitutional:      General: He is not in acute distress.    Comments: Appears fatigued and cachectic  Cardiovascular:     Rate and Rhythm: Normal rate and regular rhythm.     Heart sounds: Normal heart sounds.  Pulmonary:     Effort: Pulmonary effort is normal.     Breath sounds: Normal breath sounds.  Abdominal:     General: Bowel sounds are normal.     Palpations: Abdomen is soft.     Comments: PEG tube in place  Skin:    General: Skin is warm and dry.  Neurological:     Mental Status: He is alert and oriented to person, place, and time.      CMP Latest Ref Rng & Units 05/21/2019  Glucose 70 - 99 mg/dL 138(H)  BUN 8 - 23 mg/dL 22  Creatinine 0.61 - 1.24 mg/dL 0.53(L)  Sodium 135 - 145 mmol/L 132(L)  Potassium 3.5 - 5.1 mmol/L 3.8  Chloride 98 - 111 mmol/L 92(L)  CO2 22 - 32 mmol/L 33(H)  Calcium 8.9 - 10.3 mg/dL 8.5(L)  Total Protein 6.5 - 8.1 g/dL 6.3(L)  Total Bilirubin 0.3 - 1.2 mg/dL 0.5  Alkaline Phos 38 - 126 U/L 50  AST 15 - 41 U/L 16  ALT 0 - 44 U/L 16   CBC Latest  Ref Rng & Units 05/21/2019  WBC 4.0 - 10.5 K/uL 4.8  Hemoglobin 13.0 - 17.0 g/dL 8.6(L)  Hematocrit 39.0 - 52.0 % 25.8(L)  Platelets 150 - 400 K/uL 203    No images are attached to the encounter.  IR Gastrostomy Tube  Result Date: 04/26/2019 INDICATION: Laryngeal CA, ongoing chemotherapy and radiation EXAM: FLUOROSCOPIC 20 FRENCH PULL-THROUGH GASTROSTOMY Date:  04/26/2019 04/26/2019 9:58 am Radiologist:  M. Daryll Brod, MD Guidance:  Ultrasound and fluoroscopic MEDICATIONS: Ancef 2 g; Antibiotics were administered within 1 hour of the procedure. Glucagon 0.5 mg IV ANESTHESIA/SEDATION: Versed 3.0 mg IV; Fentanyl 100 mcg IV Moderate Sedation Time:  12 minutes The patient was continuously monitored during the procedure by the interventional radiology nurse under my direct supervision. CONTRAST:  44m VISIPAQUE IODIXANOL 320 MG/ML IV SOLN - administered into the gastric lumen. FLUOROSCOPY TIME:  Fluoroscopy Time: 2 minutes 16 seconds (8.8 mGy). COMPLICATIONS: None immediate. PROCEDURE: Informed consent was obtained from the patient following explanation of the procedure, risks, benefits and alternatives. The patient understands, agrees and consents for the procedure. All questions were addressed. A time out was performed. Maximal barrier sterile technique utilized including caps, mask, sterile gowns, sterile gloves, large sterile drape, hand hygiene, and betadine prep. The left upper quadrant was sterilely prepped and draped. An oral gastric catheter was inserted  into the stomach under fluoroscopy. The existing nasogastric feeding tube was removed. Air was injected into the stomach for insufflation and visualization under fluoroscopy. The air distended stomach was confirmed beneath the anterior abdominal wall in the frontal and lateral projections. Under sterile conditions and local anesthesia, a 51 gauge trocar needle was utilized to access the stomach percutaneously beneath the left subcostal margin. Needle  position was confirmed within the stomach under biplane fluoroscopy. Contrast injection confirmed position also. A single T tack was deployed for gastropexy. Over an Amplatz guide wire, a 9-French sheath was inserted into the stomach. A snare device was utilized to capture the oral gastric catheter. The snare device was pulled retrograde from the stomach up the esophagus and out the oropharynx. The 20-French pull-through gastrostomy was connected to the snare device and pulled antegrade through the oropharynx down the esophagus into the stomach and then through the percutaneous tract external to the patient. The gastrostomy was assembled externally. Contrast injection confirms position in the stomach. Images were obtained for documentation. The patient tolerated procedure well. No immediate complication. IMPRESSION: Fluoroscopic insertion of a 20-French "pull-through" gastrostomy. Electronically Signed   By: Jerilynn Mages.  Shick M.D.   On: 04/26/2019 10:37     Assessment and plan- Patient is a 69 y.o. 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  He is still anemic today with a hemoglobin of 8.4.Continues to lose weight and is down to 95 pounds today.  Has a significant symptom burden including nausea pain during swallowing and difficulty tolerating tube feeds.  I will therefore continue to hold chemotherapy at this time and patient will go on to complete his radiation treatment on 05/31/2019.  Ideally chemotherapy needs to be given concurrently with radiation treatment for her to be more effective and act as a radiosensitizer.  However given patient's poor performance status chemotherapy is on hold.  His renal functions are presently normal and he does not  have any significant electrolyte abnormalities.I will therefore hold off on giving him IV fluids at this time. He has mild hyponatremia and hyperchloremia which is chronic.  I will reassess his symptoms next week with repeat labs CBC with differential, CMP and magnesium and for possible IV fluids.  Until his nutritional status improves it would be challenging to give him any further chemotherapy    Visit Diagnosis 1. Severe protein-calorie malnutrition Altamease Oiler: less than 60% of standard weight) ([place])   2. Neoplasm related pain   3. Squamous cell carcinoma of larynx (HCC)      Dr. Randa Evens, MD, MPH [facility] at [facility] [phone] 05/22/2019 2:10 PM

## 2019-05-23 ENCOUNTER — Ambulatory Visit: Payer: Medicare Other

## 2019-05-23 ENCOUNTER — Ambulatory Visit
Admission: RE | Admit: 2019-05-23 | Discharge: 2019-05-23 | Disposition: A | Discharge status: Home / Self Care | Payer: Medicare Other | Source: Ambulatory Visit | Attending: Radiation Oncology | Admitting: Radiation Oncology

## 2019-05-23 DIAGNOSIS — C329 Malignant neoplasm of larynx, unspecified: Secondary | ICD-10-CM | POA: Diagnosis not present

## 2019-05-23 DIAGNOSIS — C61 Malignant neoplasm of prostate: Secondary | ICD-10-CM | POA: Diagnosis not present

## 2019-05-23 DIAGNOSIS — J029 Acute pharyngitis, unspecified: Secondary | ICD-10-CM | POA: Diagnosis not present

## 2019-05-23 DIAGNOSIS — F419 Anxiety disorder, unspecified: Secondary | ICD-10-CM | POA: Diagnosis not present

## 2019-05-23 DIAGNOSIS — R634 Abnormal weight loss: Secondary | ICD-10-CM | POA: Diagnosis not present

## 2019-05-23 DIAGNOSIS — R1310 Dysphagia, unspecified: Secondary | ICD-10-CM | POA: Diagnosis not present

## 2019-05-24 ENCOUNTER — Ambulatory Visit
Admission: RE | Admit: 2019-05-24 | Discharge: 2019-05-24 | Disposition: A | Discharge status: Home / Self Care | Payer: Medicare Other | Source: Ambulatory Visit | Attending: Radiation Oncology | Admitting: Radiation Oncology

## 2019-05-24 ENCOUNTER — Ambulatory Visit: Payer: Medicare Other

## 2019-05-24 ENCOUNTER — Other Ambulatory Visit: Payer: Self-pay

## 2019-05-24 ENCOUNTER — Other Ambulatory Visit: Payer: Medicare Other | Admitting: Adult Health Nurse Practitioner

## 2019-05-24 DIAGNOSIS — Z515 Encounter for palliative care: Secondary | ICD-10-CM | POA: Diagnosis not present

## 2019-05-24 DIAGNOSIS — C329 Malignant neoplasm of larynx, unspecified: Secondary | ICD-10-CM | POA: Diagnosis not present

## 2019-05-24 DIAGNOSIS — R1310 Dysphagia, unspecified: Secondary | ICD-10-CM | POA: Diagnosis not present

## 2019-05-24 DIAGNOSIS — J029 Acute pharyngitis, unspecified: Secondary | ICD-10-CM | POA: Diagnosis not present

## 2019-05-24 DIAGNOSIS — R634 Abnormal weight loss: Secondary | ICD-10-CM | POA: Diagnosis not present

## 2019-05-24 DIAGNOSIS — C61 Malignant neoplasm of prostate: Secondary | ICD-10-CM | POA: Diagnosis not present

## 2019-05-24 DIAGNOSIS — F419 Anxiety disorder, unspecified: Secondary | ICD-10-CM | POA: Diagnosis not present

## 2019-05-24 NOTE — Progress Notes (Signed)
[facility] Consult Note Telephone: [phone]  Fax: [phone]  PATIENT NAME: Kevin Nelson DOB: Jul 28, 1950 MRN: [id number]  PRIMARY CARE PROVIDER:   [name], NP  REFERRING PROVIDER:  [name], NP [address] [place],  [place] [address]  RESPONSIBLE PARTY:   [place], [place]      RECOMMENDATIONS and PLAN:  1.  Advanced care planning. Listed as [place]  2.  Squamous cell carcinoma of larynx. Patient has been undergoing radiation and chemo. Patient has become increasingly weak and frail doing both chemo and radiation.  Currently is undergoing just the radiation and finishing the chemo based on results after radiation.  He is in better spirits and seems like he is stronger just doing the radiation. His last radiation treatment is next Friday.  He has burns to neck and they are applying lotion to it.  His appetite continues to be poor and has lost down to 95 pounds.  His nutritional supplement has been changed to a kind that has more nutrients and calories.  He did not tolerate at first but as long as they do smaller more frequent feedings he does better.  He has started taking in some oral nutrition but only small amounts of soft foods.  Encouraged to continue small more frequent feedings whether through tube or orally and to slowly increase the amount he takes in at a time.  Also encouraged to start oral intake with soft bland foods as not to cause stomach upset.  Continue follow up and recommendations by oncology  3.  Pain.  Patient currently takes oxycontin 10 mg BID and has oxycodone 10 mg Q4 hrs PRN for breakthrough pain.  States he does well with this as long as he stays ahead of the pain.  His finacee helps encourage him to stay on top of the pain.  Continue current pain regimen  Palliative care will continue to monitor for symptom management and make recommendations as needed.  Will call in 2 weeks to see plans for continued chemotherapy and schedule next visit.  Encouraged to call with any questions or concerns.  I spent 60 minutes providing this consultation,  from 3:00 to 4:00 including time with patient/family, chart review, provider coordination, and documentation. More than 50% of the time in this consultation was spent coordinating communication.   HISTORY OF PRESENT ILLNESS:  Kevin Nelson is a 69 y.o. year old male with multiple medical problems including severe stage 3 COPD,larynx cancer, prostate cancer, skin cancer, CAD, chronic hepatitis. Palliative Care was asked to help address goals of care.   CODE STATUS: see above  PPS: 60% HOSPICE ELIGIBILITY/DIAGNOSIS: TBD  PHYSICAL EXAM:   General: NAD, frail appearing, thin Abdomen: PEG tube in place with no erythema, drainage, or swelling noted GU: no suprapubic tenderness Extremities: trace edema to feet and ankles, no joint deformities Skin: no rashes on exposed skin Neurological: Weakness but otherwise nonfocal  PAST MEDICAL HISTORY:  Past Medical History:  Diagnosis Date  . Anxiety   . Basal cell carcinoma   . Cigarette smoker   . Dental decay   . Dysphagia   . Dyspnea   . End stage COPD ([place])    end stage copd, emphysema  . Erectile dysfunction   . Headache    every morning  . Hepatitis C    treated for 8 weeks this past year with Mayvret  . History of nonmelanoma skin cancer   .  Hoarseness, chronic   . Myocardial infarction [facility]) march/april 2020   mild heart attack  . Prostate cancer ([place])   . Respiratory failure, acute ([place])    was inpt for 5 days. thought he was going to die.  . Sleep apnea   . Squamous cell skin cancer   . Throat cancer ([place])   . Use of leuprolide acetate (Lupron)    treated for prostate cancer.   . 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 Smoking status: Current Every Day Smoker    Packs/day:  0.25    Years: 52.00    Pack years: 13.00    Types: Cigarettes  . Smokeless tobacco: Never Used  . Tobacco comment: 5 cigarettes a day  Substance Use Topics  . Alcohol use: Not Currently    Alcohol/week: 0.0 standard drinks    ALLERGIES: No Known Allergies   PERTINENT MEDICATIONS:  Outpatient Encounter Medications as of 05/24/2019  Medication Sig  . clonazePAM (KLONOPIN) 1 MG tablet Take 1 tablet (1 mg total) by mouth 2 (two) times daily as needed for anxiety.  . COMBIVENT RESPIMAT 20-100 MCG/ACT AERS respimat INHALE 1 PUFF BY MOUTH INTO THE LUNGS EVERY 6 HOURS AS NEEDED  . dexamethasone (DECADRON) 4 MG tablet Take 2 tablets by mouth once a day on the day after chemotherapy and then take 2 tablets two times a day for 2 days. Take with food.  [name] dexamethasone (DECADRON) 4 MG tablet Take 2 tablets (8 mg total) by mouth daily. With food every day x 1 week  . fluticasone (FLONASE) 50 MCG/ACT nasal spray SHAKE LIQUID AND USE 1 SPRAY IN EACH NOSTRIL TWICE DAILY  . ipratropium-albuterol (DUONEB) 0.5-2.5 (3) MG/3ML SOLN Take 3 mLs by nebulization every 6 (six) hours as needed.  . lidocaine (XYLOCAINE) 2 % solution Use as directed 15 mLs in the mouth or throat every 6 (six) hours as needed for mouth pain (for sore throat).  [name] lidocaine-prilocaine (EMLA) cream Apply to affected area once  . metoCLOPramide (REGLAN) 5 MG/5ML solution Take 10 mLs (10 mg total) by mouth 3 (three) times daily before meals.  . naloxegol oxalate (MOVANTIK) 25 MG TABS tablet Take 1 tablet (25 mg total) by mouth daily.  . Nutritional Supplements (FEEDING SUPPLEMENT, OSMOLITE 1.5 CAL,) LIQD Give 5 cartons per day via feeding tube (1 1/2 cartons at 8am and noon and 1 carton at 4pm and 8pm).  Flush with 3ml of water before and after each feeding.  Provides 1775 calories, 74.5 g protein and 1871ml free water and meets 100% of nutritional needs.  Send bolus supplies.  [name] ondansetron (ZOFRAN) 4 MG tablet Take 1 tablet (4 mg total) by  mouth every 8 (eight) hours as needed for nausea or vomiting.  . ondansetron (ZOFRAN) 8 MG tablet Take 1 tablet (8 mg total) by mouth 2 (two) times daily as needed. Start on the third day after chemotherapy.  [name] oxyCODONE (OXYCONTIN) 10 mg 12 hr tablet Take 1 tablet (10 mg total) by mouth every 12 (twelve) hours.  . Oxycodone HCl 10 MG TABS Take 1 tablet (10 mg total) by mouth every 4 (four) hours as needed.  . OXYGEN Inhale 3 L into the lungs daily.  . pantoprazole (PROTONIX) 20 MG tablet Take 1 tablet (20 mg total) by mouth daily. (Patient not taking: Reported on 05/14/2019)  . polyethylene glycol powder (CLEARLAX) 17 GM/SCOOP powder  Take 1 Container by mouth once.  . potassium & sodium phosphates (PHOS-NAK) 280-160-250 MG PACK Take 1 packet by mouth 2 (two) times daily. For hypophosphatemia. Give via PEG.  . prochlorperazine (COMPAZINE) 10 MG tablet Take 1 tablet (10 mg total) by mouth every 6 (six) hours as needed (Nausea or vomiting). (Patient not taking: Reported on 05/07/2019)  . sennosides (SENOKOT) 8.8 MG/5ML syrup Take 5 mLs by mouth at bedtime.  [name] STIOLTO RESPIMAT 2.5-2.5 MCG/ACT AERS Inhale 2 puffs into the lungs 2 (two) times daily as needed (shortness of breath).    No facility-administered encounter medications on file as of 05/24/2019.     Amy Jenetta Downer, NP

## 2019-05-25 ENCOUNTER — Telehealth: Payer: Self-pay

## 2019-05-25 ENCOUNTER — Ambulatory Visit
Admission: RE | Admit: 2019-05-25 | Discharge: 2019-05-25 | Disposition: A | Discharge status: Home / Self Care | Payer: Medicare Other | Source: Ambulatory Visit | Attending: Radiation Oncology | Admitting: Radiation Oncology

## 2019-05-25 DIAGNOSIS — C329 Malignant neoplasm of larynx, unspecified: Secondary | ICD-10-CM | POA: Diagnosis not present

## 2019-05-25 DIAGNOSIS — R1310 Dysphagia, unspecified: Secondary | ICD-10-CM | POA: Diagnosis not present

## 2019-05-25 DIAGNOSIS — C61 Malignant neoplasm of prostate: Secondary | ICD-10-CM | POA: Diagnosis not present

## 2019-05-25 DIAGNOSIS — J029 Acute pharyngitis, unspecified: Secondary | ICD-10-CM | POA: Diagnosis not present

## 2019-05-25 DIAGNOSIS — F419 Anxiety disorder, unspecified: Secondary | ICD-10-CM | POA: Diagnosis not present

## 2019-05-25 DIAGNOSIS — R634 Abnormal weight loss: Secondary | ICD-10-CM | POA: Diagnosis not present

## 2019-05-25 NOTE — Telephone Encounter (Signed)
Nutrition  Call received from [place], patient's girlfriend regarding how to get more of the [facility] tube feeding if needed.  Patient has taken 8 oz of [employer] [place] this am, 9 oz later today and planning to give patient 9 oz later on the pm.  Giving patient nausea medications which has helped.  Does not describe any more vomiting episodes after giving first carton of [employer] 1.4.    [name] to call RD next week on final decision if they want to switch to new formula or stay on osmolite 1.5.   Maleni Seyer B. Zenia Resides, [place], [place] Registered Dietitian [phone] (pager)

## 2019-05-28 ENCOUNTER — Inpatient Hospital Stay (HOSPITAL_BASED_OUTPATIENT_CLINIC_OR_DEPARTMENT_OTHER): Payer: Medicare Other | Admitting: Oncology

## 2019-05-28 ENCOUNTER — Ambulatory Visit
Admission: RE | Admit: 2019-05-28 | Discharge: 2019-05-28 | Disposition: A | Discharge status: Home / Self Care | Payer: Medicare Other | Source: Ambulatory Visit | Attending: Radiation Oncology | Admitting: Radiation Oncology

## 2019-05-28 ENCOUNTER — Other Ambulatory Visit: Payer: Self-pay

## 2019-05-28 ENCOUNTER — Inpatient Hospital Stay: Payer: Medicare Other

## 2019-05-28 ENCOUNTER — Other Ambulatory Visit: Payer: Self-pay | Admitting: Oncology

## 2019-05-28 VITALS — BP 112/55 | HR 80 | Temp 96.4°F | Resp 16

## 2019-05-28 DIAGNOSIS — Z95828 Presence of other vascular implants and grafts: Secondary | ICD-10-CM

## 2019-05-28 DIAGNOSIS — D649 Anemia, unspecified: Secondary | ICD-10-CM | POA: Diagnosis not present

## 2019-05-28 DIAGNOSIS — C321 Malignant neoplasm of supraglottis: Secondary | ICD-10-CM | POA: Diagnosis not present

## 2019-05-28 DIAGNOSIS — J449 Chronic obstructive pulmonary disease, unspecified: Secondary | ICD-10-CM | POA: Diagnosis not present

## 2019-05-28 DIAGNOSIS — R110 Nausea: Secondary | ICD-10-CM | POA: Diagnosis not present

## 2019-05-28 DIAGNOSIS — R531 Weakness: Secondary | ICD-10-CM

## 2019-05-28 DIAGNOSIS — C329 Malignant neoplasm of larynx, unspecified: Secondary | ICD-10-CM | POA: Diagnosis not present

## 2019-05-28 DIAGNOSIS — K5909 Other constipation: Secondary | ICD-10-CM | POA: Diagnosis not present

## 2019-05-28 DIAGNOSIS — C61 Malignant neoplasm of prostate: Secondary | ICD-10-CM | POA: Diagnosis not present

## 2019-05-28 DIAGNOSIS — G893 Neoplasm related pain (acute) (chronic): Secondary | ICD-10-CM

## 2019-05-28 DIAGNOSIS — R5383 Other fatigue: Secondary | ICD-10-CM | POA: Diagnosis not present

## 2019-05-28 DIAGNOSIS — R1310 Dysphagia, unspecified: Secondary | ICD-10-CM | POA: Diagnosis not present

## 2019-05-28 DIAGNOSIS — E43 Unspecified severe protein-calorie malnutrition: Secondary | ICD-10-CM

## 2019-05-28 DIAGNOSIS — F419 Anxiety disorder, unspecified: Secondary | ICD-10-CM | POA: Diagnosis not present

## 2019-05-28 DIAGNOSIS — J029 Acute pharyngitis, unspecified: Secondary | ICD-10-CM | POA: Diagnosis not present

## 2019-05-28 DIAGNOSIS — R634 Abnormal weight loss: Secondary | ICD-10-CM | POA: Diagnosis not present

## 2019-05-28 LAB — CBC WITH DIFFERENTIAL/PLATELET
Abs Immature Granulocytes: 0.03 10*3/uL (ref 0.00–0.07)
Basophils Absolute: 0 10*3/uL (ref 0.0–0.1)
Basophils Relative: 0 %
Eosinophils Absolute: 0.1 10*3/uL (ref 0.0–0.5)
Eosinophils Relative: 2 %
HCT: 26.3 % — ABNORMAL LOW (ref 39.0–52.0)
Hemoglobin: 8.3 g/dL — ABNORMAL LOW (ref 13.0–17.0)
Immature Granulocytes: 1 %
Lymphocytes Relative: 5 %
Lymphs Abs: 0.3 10*3/uL — ABNORMAL LOW (ref 0.7–4.0)
MCH: 28.6 pg (ref 26.0–34.0)
MCHC: 31.6 g/dL (ref 30.0–36.0)
MCV: 90.7 fL (ref 80.0–100.0)
Monocytes Absolute: 1 10*3/uL (ref 0.1–1.0)
Monocytes Relative: 17 %
Neutro Abs: 4.4 10*3/uL (ref 1.7–7.7)
Neutrophils Relative %: 75 %
Platelets: 319 10*3/uL (ref 150–400)
RBC: 2.9 MIL/uL — ABNORMAL LOW (ref 4.22–5.81)
RDW: 17.4 % — ABNORMAL HIGH (ref 11.5–15.5)
WBC: 5.7 10*3/uL (ref 4.0–10.5)
nRBC: 0 % (ref 0.0–0.2)

## 2019-05-28 LAB — COMPREHENSIVE METABOLIC PANEL
ALT: 13 U/L (ref 0–44)
AST: 13 U/L — ABNORMAL LOW (ref 15–41)
Albumin: 3.1 g/dL — ABNORMAL LOW (ref 3.5–5.0)
Alkaline Phosphatase: 50 U/L (ref 38–126)
Anion gap: 8 (ref 5–15)
BUN: 14 mg/dL (ref 8–23)
CO2: 34 mmol/L — ABNORMAL HIGH (ref 22–32)
Calcium: 8.2 mg/dL — ABNORMAL LOW (ref 8.9–10.3)
Chloride: 88 mmol/L — ABNORMAL LOW (ref 98–111)
Creatinine, Ser: 0.33 mg/dL — ABNORMAL LOW (ref 0.61–1.24)
GFR calc Af Amer: >60 mL/min (ref >60)
GFR calc non Af Amer: >60 mL/min (ref >60)
Glucose, Bld: 175 mg/dL — ABNORMAL HIGH (ref 70–99)
Potassium: 4.1 mmol/L (ref 3.5–5.1)
Sodium: 130 mmol/L — ABNORMAL LOW (ref 135–145)
Total Bilirubin: 0.6 mg/dL (ref 0.3–1.2)
Total Protein: 5.8 g/dL — ABNORMAL LOW (ref 6.5–8.1)

## 2019-05-28 LAB — MAGNESIUM: Magnesium: 1.8 mg/dL (ref 1.7–2.4)

## 2019-05-28 MED ORDER — HEPARIN SOD (PORK) LOCK FLUSH 100 UNIT/ML IV SOLN
500.0000 [IU] | Freq: Once | INTRAVENOUS | Status: AC
  Administered 2019-05-28: 500 [IU] via INTRAVENOUS
  Filled 2019-05-28: qty 5

## 2019-05-28 MED ORDER — SODIUM CHLORIDE 0.9% FLUSH
10.0000 mL | INTRAVENOUS | Status: DC | PRN
  Administered 2019-05-28: 10 mL via INTRAVENOUS
  Filled 2019-05-28: qty 10

## 2019-05-28 NOTE — Patient Instructions (Signed)
Lets adjust your pain medicine some to see if this helps your lethragy.   Continue OxyContin 10 mg every 12 hours.  Take oxycodone 10 mg every 6 hours for break through pain.   Try taking your Klonopin only at bedtime to help you sleep.  If symptoms do not improve, please call clinic for further evaluation.  Faythe Casa, NP 05/28/2019 10:49 AM

## 2019-05-28 NOTE — Progress Notes (Signed)
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  Chief complaint/ Reason for visit-weakness/dehydration  Heme/Onc history:  Oncology History  Squamous cell carcinoma of larynx ([place])  01/04/2019 Initial Diagnosis   Squamous cell carcinoma of larynx ([place])   03/09/2019 Cancer Staging   Staging form: Larynx - Glottis, AJCC 8th Edition - Clinical stage from 03/09/2019: Stage IVA (cT4a, cN1, cM0) - Signed by Sindy Guadeloupe, MD on 03/11/2019   03/26/2019 -  Chemotherapy   The patient had palonosetron (ALOXI) injection 0.25 mg, 0.25 mg, Intravenous,  Once, 5 of 7 cycles Administration: 0.25 mg (03/26/2019), 0.25 mg (04/02/2019), 0.25 mg (04/09/2019), 0.25 mg (04/23/2019), 0.25 mg (04/30/2019) CISplatin (PLATINOL) 60 mg in sodium chloride 0.9 % 250 mL chemo infusion, 40 mg/m2 = 60 mg, Intravenous,  Once, 5 of 7 cycles Administration: 60 mg (03/26/2019), 60 mg (04/02/2019), 60 mg (04/09/2019), 60 mg (04/23/2019), 60 mg (04/30/2019) fosaprepitant (EMEND) 150 mg in sodium chloride 0.9 % 145 mL IVPB, 150 mg, Intravenous,  Once, 5 of 7 cycles Administration: 150 mg (03/26/2019), 150 mg (04/02/2019), 150 mg (04/09/2019), 150 mg (04/23/2019), 150 mg (04/30/2019)  for chemotherapy treatment.      Interval history-Mr. Monk is a 69 year old patient who presents to symptom management with past medical history significant for tobacco dependence, COPD on home oxygen who recently was diagnosed with stage IVa squamous cell carcinoma of the  supraglottic [place].  Patient met with [facility] ENT and decided against total laryngectomy.  He has received a total of 5 cycles of weekly cisplatin last given on 04/30/2019.  It has been on hold for several weeks due to poor nutrition and ongoing fatigue.  Today, he presented for daily radiation and patient's wife told radiation staff that he has not been feeling well with intermittent fevers and feeling unsteady on his feet with extreme lethargy.  Dr. Donella Stade recommended he be seen by [facility] today.  Mr. Orozco wife states that she has noticed he has become more lethargic over the past couple weeks.  He has been sleeping intermittently throughout the day and often falls asleep during a conversation or while eating.  She also noted a low-grade fever last Thursday every evening through Saturday; T-max 100.0.  Every morning his temperature is normal around 98.0.  His appetite is poor and he is working with our oncology dietitian to help manage his weight and increase his caloric intake.  We reviewed his medications and went over dosing of his narcotics.  Patient states he is taking 1-1/2 tablets (79m) of his oxycodone every 4 hours and 10 mg OxyContin every 12 hours.  He takes his Klonopin 2 times a day in the morning and at bedtime.  He denies any nausea at this time.  He suffers from chronic constipation and requires daily enemas.  He denies chest pain but has intermittent shortness of breath requiring oxygen.  He continues to smoke.  ECOG FS:1 - Symptomatic but completely ambulatory  Review of systems- Review of Systems  Constitutional: Positive for fever (low grade X 3 days), malaise/fatigue and  weight loss. Negative for chills.  HENT: Negative for congestion, ear pain and tinnitus.   Eyes: Negative.  Negative for blurred vision and double vision.  Respiratory: Positive for shortness of breath (chronic). Negative for cough and sputum production.   Cardiovascular: Negative.  Negative for chest pain,  palpitations and leg swelling.  Gastrointestinal: Positive for constipation. Negative for abdominal pain, diarrhea, nausea and vomiting.  Genitourinary: Negative for dysuria, frequency and urgency.  Musculoskeletal: Negative for back pain and falls.  Skin: Negative.  Negative for rash.  Neurological: Positive for dizziness and weakness. Negative for headaches.  Endo/Heme/Allergies: Negative.  Does not bruise/bleed easily.  Psychiatric/Behavioral: Negative for depression. The patient is nervous/anxious. The patient does not have insomnia.     Current treatment-concurrent chemoradiation.  Chemo on hold due to declining performance status.  No Known Allergies   Past Medical History:  Diagnosis Date  . Anxiety   . Basal cell carcinoma   . Cigarette smoker   . Dental decay   . Dysphagia   . Dyspnea   . End stage COPD ([place])    end stage copd, emphysema  . Erectile dysfunction   . Headache    every morning  . Hepatitis C    treated for 8 weeks this past year with Mayvret  . History of nonmelanoma skin cancer   . Hoarseness, chronic   . Myocardial infarction [facility]) march/april 2020   mild heart attack  . Prostate cancer ([place])   . Respiratory failure, acute ([place])    was inpt for 5 days. thought he was going to die.  . Sleep apnea   . Squamous cell skin cancer   . Throat cancer ([place])   . Use of leuprolide acetate (Lupron)    treated for prostate cancer.   . 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     Past Surgical History:  Procedure Laterality Date  . FACIAL LACERATION REPAIR    . HERNIA REPAIR Left 1992   inguinal  . IR GASTROSTOMY TUBE MOD SED  04/26/2019  . MICROLARYNGOSCOPY N/A 01/17/2019   Procedure: MICROLARYNGOSCOPY WITH BX;  Surgeon: Clyde Canterbury, MD;  Location: [facility] ORS;  Service: ENT;  Laterality: N/A;  . PORTA CATH INSERTION N/A 03/15/2019   Procedure: PORTA CATH INSERTION;  Surgeon: Algernon Huxley, MD;  Location: [place] CV LAB;  Service:  Cardiovascular;  Laterality: N/A;  . PROSTATE BIOPSY    . SKIN CANCER EXCISION    . TRIGGER FINGER RELEASE Left 10/2018    Social History   Socioeconomic History  . Marital status: Significant Other    Spouse name: [name], girlfriend  . Number of children: Not on file  . Years of education: Not on file  . Highest education level: Not on file  Occupational History  . Occupation: Chief Strategy Officer    Comment: retired  Tobacco Use  . Smoking status: Current Every Day Smoker    Packs/day: 0.25    Years: 52.00    Pack years: 13.00    Types: Cigarettes  . Smokeless tobacco: Never Used  . Tobacco comment: 5 cigarettes a day  Substance and Sexual Activity  . Alcohol use: Not Currently    Alcohol/week: 0.0 standard drinks  . Drug use: Never  . Sexual activity: Yes  Other Topics Concern  . Not on file  Social History Narrative   Patient has retired d/t respiratory status. Currently lives with girlfriend    Social Determinants of Radio broadcast assistant  Strain: Low Risk   . Difficulty of Paying Living Expenses: Not hard at all  Food Insecurity: No Food Insecurity  . Worried About Charity fundraiser in the Last Year: Never true  . Ran Out of Food in the Last Year: Never true  Transportation Needs: No Transportation Needs  . Lack of Transportation (Medical): No  . Lack of Transportation (Non-Medical): No  Physical Activity: Inactive  . Days of Exercise per Week: 0 days  . Minutes of Exercise per Session: 0 min  Stress: Stress Concern Present  . Feeling of Stress : Very much  Social Connections:   . Frequency of Communication with Friends and Family:   . Frequency of Social Gatherings with Friends and Family:   . Attends Religious Services:   . Active Member of Clubs or Organizations:   . Attends Archivist Meetings:   [name] Marital Status:   Intimate Partner Violence:   . Fear of Current or Ex-Partner:   . Emotionally Abused:   [name] Physically Abused:   . Sexually  Abused:     Family History  Problem Relation Age of Onset  . Cancer Sister   . Cancer - Cervical Mother   . Heart attack Brother   . Heart attack Maternal Grandfather      Current Outpatient Medications:  .  ondansetron (ZOFRAN) 8 MG tablet, Take 1 tablet (8 mg total) by mouth 2 (two) times daily as needed. Start on the third day after chemotherapy., Disp: 30 tablet, Rfl: 1 .  oxyCODONE (OXYCONTIN) 10 mg 12 hr tablet, Take 1 tablet (10 mg total) by mouth every 12 (twelve) hours., Disp: 60 tablet, Rfl: 0 .  Oxycodone HCl 10 MG TABS, Take 1 tablet (10 mg total) by mouth every 4 (four) hours as needed., Disp: 90 tablet, Rfl: 0 .  OXYGEN, Inhale 3 L into the lungs daily., Disp: , Rfl:  .  prochlorperazine (COMPAZINE) 10 MG tablet, Take 1 tablet (10 mg total) by mouth every 6 (six) hours as needed (Nausea or vomiting)., Disp: 30 tablet, Rfl: 1 .  clonazePAM (KLONOPIN) 1 MG tablet, Take 1 tablet (1 mg total) by mouth 2 (two) times daily as needed for anxiety. (Patient not taking: Reported on 05/28/2019), Disp: 60 tablet, Rfl: 2 .  COMBIVENT RESPIMAT 20-100 MCG/ACT AERS respimat, INHALE 1 PUFF BY MOUTH INTO THE LUNGS EVERY 6 HOURS AS NEEDED, Disp: 4 g, Rfl: 0 .  dexamethasone (DECADRON) 4 MG tablet, Take 2 tablets by mouth once a day on the day after chemotherapy and then take 2 tablets two times a day for 2 days. Take with food., Disp: 30 tablet, Rfl: 1 .  dexamethasone (DECADRON) 4 MG tablet, Take 2 tablets (8 mg total) by mouth daily. With food every day x 1 week, Disp: 7 tablet, Rfl: 0 .  fluticasone (FLONASE) 50 MCG/ACT nasal spray, SHAKE LIQUID AND USE 1 SPRAY IN EACH NOSTRIL TWICE DAILY, Disp: 16 g, Rfl: 1 .  ipratropium-albuterol (DUONEB) 0.5-2.5 (3) MG/3ML SOLN, Take 3 mLs by nebulization every 6 (six) hours as needed., Disp: 360 mL, Rfl: 11 .  lidocaine (XYLOCAINE) 2 % solution, Use as directed 15 mLs in the mouth or throat every 6 (six) hours as needed for mouth pain (for sore throat).,  Disp: 100 mL, Rfl: 2 .  lidocaine-prilocaine (EMLA) cream, Apply to affected area once, Disp: 30 g, Rfl: 3 .  metoCLOPramide (REGLAN) 5 MG/5ML solution, Take 10 mLs (10 mg total) by mouth 3 (three) times  daily before meals. (Patient not taking: Reported on 05/28/2019), Disp: 473 mL, Rfl: 0 .  naloxegol oxalate (MOVANTIK) 25 MG TABS tablet, Take 1 tablet (25 mg total) by mouth daily., Disp: 30 tablet, Rfl: 0 .  Nutritional Supplements (FEEDING SUPPLEMENT, OSMOLITE 1.5 CAL,) LIQD, Give 5 cartons per day via feeding tube (1 1/2 cartons at 8am and noon and 1 carton at 4pm and 8pm).  Flush with 76m of water before and after each feeding.  Provides 1775 calories, 74.5 g protein and 18075mfree water and meets 100% of nutritional needs.  Send bolus supplies., Disp: 1185 mL, Rfl: 3 .  pantoprazole (PROTONIX) 20 MG tablet, Take 1 tablet (20 mg total) by mouth daily. (Patient not taking: Reported on 05/14/2019), Disp: 30 tablet, Rfl: 2 .  polyethylene glycol powder (CLEARLAX) 17 GM/SCOOP powder, Take 1 Container by mouth once., Disp: , Rfl:  .  potassium & sodium phosphates (PHOS-NAK) 280-160-250 MG PACK, Take 1 packet by mouth 2 (two) times daily. For hypophosphatemia. Give via PEG., Disp: 120 each, Rfl: 0 .  sennosides (SENOKOT) 8.8 MG/5ML syrup, Take 5 mLs by mouth at bedtime., Disp: 240 mL, Rfl: 3 .  STIOLTO RESPIMAT 2.5-2.5 MCG/ACT AERS, Inhale 2 puffs into the lungs 2 (two) times daily as needed (shortness of breath). , Disp: , Rfl:   Physical exam:  Vitals:   05/28/19 1012  BP: (!) 112/55  Pulse: 80  Resp: 16  Temp: (!) 96.4 F (35.8 C)  TempSrc: Tympanic  SpO2: 98%  PF: (!) 2 L/min   Physical Exam Constitutional:      Appearance: Normal appearance. He is cachectic.  HENT:     Head: Normocephalic and atraumatic.  Eyes:     Pupils: Pupils are equal, round, and reactive to light.  Cardiovascular:     Rate and Rhythm: Normal rate and regular rhythm.     Heart sounds: Normal heart sounds. No  murmur.  Pulmonary:     Effort: Pulmonary effort is normal.     Breath sounds: Normal breath sounds. No wheezing.  Abdominal:     General: Bowel sounds are normal. There is no distension.     Palpations: Abdomen is soft.     Tenderness: There is no abdominal tenderness.  Musculoskeletal:        General: Normal range of motion.     Cervical back: Normal range of motion.  Skin:    General: Skin is warm and dry.     Findings: No rash.  Neurological:     Mental Status: He is oriented to person, place, and time. He is lethargic.  Psychiatric:        Judgment: Judgment normal.      CMP Latest Ref Rng & Units 05/28/2019  Glucose 70 - 99 mg/dL 175(H)  BUN 8 - 23 mg/dL 14  Creatinine 0.61 - 1.24 mg/dL 0.33(L)  Sodium 135 - 145 mmol/L 130(L)  Potassium 3.5 - 5.1 mmol/L 4.1  Chloride 98 - 111 mmol/L 88(L)  CO2 22 - 32 mmol/L 34(H)  Calcium 8.9 - 10.3 mg/dL 8.2(L)  Total Protein 6.5 - 8.1 g/dL 5.8(L)  Total Bilirubin 0.3 - 1.2 mg/dL 0.6  Alkaline Phos 38 - 126 U/L 50  AST 15 - 41 U/L 13(L)  ALT 0 - 44 U/L 13   CBC Latest Ref Rng & Units 05/28/2019  WBC 4.0 - 10.5 K/uL 5.7  Hemoglobin 13.0 - 17.0 g/dL 8.3(L)  Hematocrit 39.0 - 52.0 % 26.3(L)  Platelets 150 - 400  K/uL 319    No images are attached to the encounter.  No results found.   Assessment and plan- Patient is a 69 y.o. male who presented to symptom management today for concerns of increased lethargy and weakness.  Weakness and lethargy is likely multifactorial.  Although he has not had chemotherapy in approximately 3 or 4 weeks, he is still receiving daily radiation and is on several medications that can certainly cause sedation.  We reviewed his medications today in clinic and I believe he is taking 15 mg oxycodone every 4 hours along with his OxyContin 10 mg every 12.  I have asked that he begin taking his prescribed amount of 10 mg oxycodone every 6 hours and maintain his OxyContin every 12 hours as prescribed.  I have  also asked that he take his Klonopin once a day versus twice a day to see if this improves his lethargy.  Discussed with Dr. Janese Banks who agrees.  Plan: Labs.  Reviewed and are stable-no evidence of dehydration or infection.  Vital signs.  Stable. Continue 10 mg OxyContin twice daily. Take 10 mg oxycodone every 6 hours as needed for pain. Decrease Klonopin from twice daily down to once daily prn for anxiety.  Disposition: RTC daily for radiation-should complete on Thursday. RTC on 05/31/2019 for lab work, MD assessment and possible chemotherapy.   Visit Diagnosis 1. Lethargic   2. Neoplasm related pain   3. Squamous cell carcinoma of larynx [facility])     Patient expressed understanding and was in agreement with this plan. He also understands that He can call clinic at any time with any questions, concerns, or complaints.   Greater than 50% was spent in counseling and coordination of care with this patient including but not limited to discussion of the relevant topics above (See A&P) including, but not limited to diagnosis and management of acute and chronic medical conditions.   Thank you for allowing me to participate in the care of this very pleasant patient.    Jacquelin Hawking, NP [place] [facility] Cell - [phone] Pager- [phone] 05/28/2019 11:06 AM

## 2019-05-29 ENCOUNTER — Ambulatory Visit
Admission: RE | Admit: 2019-05-29 | Discharge: 2019-05-29 | Disposition: A | Discharge status: Home / Self Care | Payer: Medicare Other | Source: Ambulatory Visit | Attending: Radiation Oncology | Admitting: Radiation Oncology

## 2019-05-29 DIAGNOSIS — C329 Malignant neoplasm of larynx, unspecified: Secondary | ICD-10-CM | POA: Diagnosis not present

## 2019-05-29 DIAGNOSIS — J029 Acute pharyngitis, unspecified: Secondary | ICD-10-CM | POA: Diagnosis not present

## 2019-05-29 DIAGNOSIS — F419 Anxiety disorder, unspecified: Secondary | ICD-10-CM | POA: Diagnosis not present

## 2019-05-29 DIAGNOSIS — C61 Malignant neoplasm of prostate: Secondary | ICD-10-CM | POA: Diagnosis not present

## 2019-05-29 DIAGNOSIS — R1310 Dysphagia, unspecified: Secondary | ICD-10-CM | POA: Diagnosis not present

## 2019-05-29 DIAGNOSIS — R634 Abnormal weight loss: Secondary | ICD-10-CM | POA: Diagnosis not present

## 2019-05-30 ENCOUNTER — Ambulatory Visit
Admission: RE | Admit: 2019-05-30 | Discharge: 2019-05-30 | Disposition: A | Discharge status: Home / Self Care | Payer: Medicare Other | Source: Ambulatory Visit | Attending: Radiation Oncology | Admitting: Radiation Oncology

## 2019-05-30 ENCOUNTER — Ambulatory Visit: Payer: Medicare Other

## 2019-05-30 DIAGNOSIS — C329 Malignant neoplasm of larynx, unspecified: Secondary | ICD-10-CM | POA: Diagnosis not present

## 2019-05-30 DIAGNOSIS — R1310 Dysphagia, unspecified: Secondary | ICD-10-CM | POA: Diagnosis not present

## 2019-05-30 DIAGNOSIS — C61 Malignant neoplasm of prostate: Secondary | ICD-10-CM | POA: Diagnosis not present

## 2019-05-30 DIAGNOSIS — F419 Anxiety disorder, unspecified: Secondary | ICD-10-CM | POA: Diagnosis not present

## 2019-05-30 DIAGNOSIS — R634 Abnormal weight loss: Secondary | ICD-10-CM | POA: Diagnosis not present

## 2019-05-30 DIAGNOSIS — J029 Acute pharyngitis, unspecified: Secondary | ICD-10-CM | POA: Diagnosis not present

## 2019-05-31 ENCOUNTER — Encounter: Payer: Self-pay | Admitting: Oncology

## 2019-05-31 ENCOUNTER — Inpatient Hospital Stay: Payer: Medicare Other

## 2019-05-31 ENCOUNTER — Inpatient Hospital Stay (HOSPITAL_BASED_OUTPATIENT_CLINIC_OR_DEPARTMENT_OTHER): Payer: Medicare Other | Admitting: Oncology

## 2019-05-31 ENCOUNTER — Other Ambulatory Visit: Payer: Self-pay

## 2019-05-31 ENCOUNTER — Ambulatory Visit
Admission: RE | Admit: 2019-05-31 | Discharge: 2019-05-31 | Disposition: A | Discharge status: Home / Self Care | Payer: Medicare Other | Source: Ambulatory Visit | Attending: Radiation Oncology | Admitting: Radiation Oncology

## 2019-05-31 VITALS — BP 123/56 | HR 53 | Temp 95.6°F | Resp 16 | Ht 66.0 in | Wt 97.1 lb

## 2019-05-31 DIAGNOSIS — C61 Malignant neoplasm of prostate: Secondary | ICD-10-CM | POA: Diagnosis not present

## 2019-05-31 DIAGNOSIS — G893 Neoplasm related pain (acute) (chronic): Secondary | ICD-10-CM

## 2019-05-31 DIAGNOSIS — D649 Anemia, unspecified: Secondary | ICD-10-CM | POA: Diagnosis not present

## 2019-05-31 DIAGNOSIS — F32A Depression, unspecified: Secondary | ICD-10-CM

## 2019-05-31 DIAGNOSIS — C329 Malignant neoplasm of larynx, unspecified: Secondary | ICD-10-CM

## 2019-05-31 DIAGNOSIS — R634 Abnormal weight loss: Secondary | ICD-10-CM | POA: Diagnosis not present

## 2019-05-31 DIAGNOSIS — F419 Anxiety disorder, unspecified: Secondary | ICD-10-CM | POA: Diagnosis not present

## 2019-05-31 DIAGNOSIS — K5909 Other constipation: Secondary | ICD-10-CM | POA: Diagnosis not present

## 2019-05-31 DIAGNOSIS — E43 Unspecified severe protein-calorie malnutrition: Secondary | ICD-10-CM

## 2019-05-31 DIAGNOSIS — F329 Major depressive disorder, single episode, unspecified: Secondary | ICD-10-CM | POA: Diagnosis not present

## 2019-05-31 DIAGNOSIS — C321 Malignant neoplasm of supraglottis: Secondary | ICD-10-CM | POA: Diagnosis not present

## 2019-05-31 DIAGNOSIS — Z95828 Presence of other vascular implants and grafts: Secondary | ICD-10-CM

## 2019-05-31 DIAGNOSIS — J449 Chronic obstructive pulmonary disease, unspecified: Secondary | ICD-10-CM | POA: Diagnosis not present

## 2019-05-31 DIAGNOSIS — J029 Acute pharyngitis, unspecified: Secondary | ICD-10-CM | POA: Diagnosis not present

## 2019-05-31 DIAGNOSIS — R1310 Dysphagia, unspecified: Secondary | ICD-10-CM | POA: Diagnosis not present

## 2019-05-31 DIAGNOSIS — R110 Nausea: Secondary | ICD-10-CM | POA: Diagnosis not present

## 2019-05-31 LAB — COMPREHENSIVE METABOLIC PANEL
ALT: 13 U/L (ref 0–44)
AST: 15 U/L (ref 15–41)
Albumin: 3.2 g/dL — ABNORMAL LOW (ref 3.5–5.0)
Alkaline Phosphatase: 50 U/L (ref 38–126)
Anion gap: 8 (ref 5–15)
BUN: 15 mg/dL (ref 8–23)
CO2: 33 mmol/L — ABNORMAL HIGH (ref 22–32)
Calcium: 8.6 mg/dL — ABNORMAL LOW (ref 8.9–10.3)
Chloride: 91 mmol/L — ABNORMAL LOW (ref 98–111)
Creatinine, Ser: 0.49 mg/dL — ABNORMAL LOW (ref 0.61–1.24)
GFR calc Af Amer: >60 mL/min (ref >60)
GFR calc non Af Amer: >60 mL/min (ref >60)
Glucose, Bld: 162 mg/dL — ABNORMAL HIGH (ref 70–99)
Potassium: 4.1 mmol/L (ref 3.5–5.1)
Sodium: 132 mmol/L — ABNORMAL LOW (ref 135–145)
Total Bilirubin: 0.5 mg/dL (ref 0.3–1.2)
Total Protein: 6.2 g/dL — ABNORMAL LOW (ref 6.5–8.1)

## 2019-05-31 LAB — CBC WITH DIFFERENTIAL/PLATELET
Abs Immature Granulocytes: 0.11 10*3/uL — ABNORMAL HIGH (ref 0.00–0.07)
Basophils Absolute: 0 10*3/uL (ref 0.0–0.1)
Basophils Relative: 1 %
Eosinophils Absolute: 0.1 10*3/uL (ref 0.0–0.5)
Eosinophils Relative: 2 %
HCT: 26.1 % — ABNORMAL LOW (ref 39.0–52.0)
Hemoglobin: 8.5 g/dL — ABNORMAL LOW (ref 13.0–17.0)
Immature Granulocytes: 2 %
Lymphocytes Relative: 10 %
Lymphs Abs: 0.5 10*3/uL — ABNORMAL LOW (ref 0.7–4.0)
MCH: 29 pg (ref 26.0–34.0)
MCHC: 32.6 g/dL (ref 30.0–36.0)
MCV: 89.1 fL (ref 80.0–100.0)
Monocytes Absolute: 1 10*3/uL (ref 0.1–1.0)
Monocytes Relative: 22 %
Neutro Abs: 3 10*3/uL (ref 1.7–7.7)
Neutrophils Relative %: 63 %
Platelets: 439 10*3/uL — ABNORMAL HIGH (ref 150–400)
RBC: 2.93 MIL/uL — ABNORMAL LOW (ref 4.22–5.81)
RDW: 17.4 % — ABNORMAL HIGH (ref 11.5–15.5)
WBC: 4.7 10*3/uL (ref 4.0–10.5)
nRBC: 0 % (ref 0.0–0.2)

## 2019-05-31 LAB — MAGNESIUM: Magnesium: 1.8 mg/dL (ref 1.7–2.4)

## 2019-05-31 LAB — PHOSPHORUS: Phosphorus: 3.5 mg/dL (ref 2.5–4.6)

## 2019-05-31 MED ORDER — HEPARIN SOD (PORK) LOCK FLUSH 100 UNIT/ML IV SOLN
500.0000 [IU] | Freq: Once | INTRAVENOUS | Status: AC
  Administered 2019-05-31: 500 [IU] via INTRAVENOUS
  Filled 2019-05-31: qty 5

## 2019-05-31 MED ORDER — SODIUM CHLORIDE 0.9% FLUSH
10.0000 mL | INTRAVENOUS | Status: DC | PRN
  Administered 2019-05-31: 10 mL via INTRAVENOUS
  Filled 2019-05-31: qty 10

## 2019-05-31 NOTE — Progress Notes (Signed)
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  Chemotherapy has been stopped.  Patient completed last radiation treatment today.  PEG placed on 4/8.   RD planning on seeing patient during fluids or after MD appointment. Patient did not want to stay for fluids and wanted to go home.  RD was asked to call patient by RN.    [employer], girlfriend and Kevin Nelson reports that patient is depressed, irritable, tired.  Reports that she is scared at times to offer feeding or medications to help with side effects.  Reports that Dr. Janese Nelson prescribed medication for depression and hopeful it will work.    Kevin Nelson is giving 9 oz of osmolite 1.5 TID.  Is going to start giving nausea medications q 6 hours, if patient will let her give it too him.  Wanting to try other medications for constipation again if patient is willing to except medication.  Reports that she has been giving enemas to help with constipation.    Reports that he ate most of egg yesterday, some of ice cream sandwich and few bites of orange sherbet mixed with gingerale.  Overall intake orally is low.  Not able to drink ensure/boost shakes.    Medications: viscous lidocaine, reglan,  Movantik, zofran, clearlax, compazine  Labs: Na 132, glucose 162, Phosphorus 3.5, Mag 1.8  Anthropometrics:   Weight 97 lb 1.6 oz today increased from 95 lb   Estimated Energy Needs  Kcals: 1500-1750 Protein: 75-88 g Fluid: > 1.5 L  NUTRITION DIAGNOSIS: Inadequate oral intake continues   INTERVENTION:  Continue with osmolite 1.5, goal is 5 cartons per day to better meet nutritional needs.  Kevin Nelson is aware of goal and continues to work with patient to take more tube feeding.  Patient not meeting nutritional needs.  RD has discussed continuous tube feeding option with Kevin Nelson and patient in the past but decline this at this time.  Continue medications to help with nausea, constipation.   Discussed soft foods  with high calories and protein for patient to eat orally. Does not like ensure/boost/clear shakes.  Kevin Nelson has contact information  MONITORING, EVALUATION, GOAL: weight trends, intake, tube feeding   NEXT VISIT: June 3rd, after MD appt  Kevin Nelson, [place], [place] Registered Dietitian [phone] (pager)

## 2019-05-31 NOTE — Progress Notes (Signed)
Pt still in pain in stomach and right side of chest. He is depressed. Stays nauseated and certain meds when he tries them and gets nauseated -he does not want to try again. He is constipated. Not using movantik or senokot or miralax due to nausea.i told him that they should try nausea med around the clock on regular basis and see if it helps

## 2019-06-01 ENCOUNTER — Other Ambulatory Visit: Payer: Self-pay | Admitting: *Deleted

## 2019-06-01 ENCOUNTER — Telehealth: Payer: Self-pay | Admitting: *Deleted

## 2019-06-01 ENCOUNTER — Other Ambulatory Visit: Payer: Self-pay | Admitting: Oncology

## 2019-06-01 DIAGNOSIS — C329 Malignant neoplasm of larynx, unspecified: Secondary | ICD-10-CM

## 2019-06-01 MED ORDER — OXYCODONE HCL 10 MG PO TABS: 10.0000 mg | ORAL_TABLET | ORAL | 0 refills | Status: DC | PRN

## 2019-06-01 MED ORDER — CITALOPRAM HYDROBROMIDE 20 MG PO TABS
20.0000 mg | ORAL_TABLET | Freq: Every day | ORAL | 0 refills | Status: DC
Start: 2019-06-01 — End: 2019-06-25

## 2019-06-01 MED ORDER — OXYCODONE HCL ER 10 MG PO T12A: 10.0000 mg | EXTENDED_RELEASE_TABLET | Freq: Two times a day (BID) | ORAL | 0 refills | Status: DC

## 2019-06-01 MED ORDER — PROCHLORPERAZINE MALEATE 10 MG PO TABS: 10.0000 mg | ORAL_TABLET | Freq: Four times a day (QID) | ORAL | 1 refills | Status: DC | PRN

## 2019-06-01 NOTE — Telephone Encounter (Signed)
I have sent it just now

## 2019-06-01 NOTE — Progress Notes (Signed)
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 Richardson Landry with symptoms of difficulty swallowing and persistent sore throat which has been ongoing for the last 1 year.NPL exam showed afriable mass involving the false vocal cord but did not involve the true vocal cords. True vocal cords remain mobile on exam. Hypopharynx and tongue base was clear.   This was followed by CT soft tissue neck which showed a 3.3 x 1.7 x 3.5 cm mass which was thought to be involving both the true and false vocal cords and extending anteriorly to involve the anterior commissure. Mass extends to involve the paraglottic space and erodes through the thyroid cartilage anteriorly and left of midline. There is also invasion of the preepiglottic space inferiorly. Enlarged cystic necrotic level 3/4 lymph node measuring 1.6 x 1.2 cm. Findings are consistent with nodal metastatic disease. Biopsy of the mass showed squamous cell carcinoma.  PET/CT showed hyppermetabolic mass extending from left glottis to anterior commisure. Solitary left level 2 metastatic Lymph node. No distant metastatic disease  Patient met with [facility] ENT and decided against total laryngectomy. Plan is for concurrent chemo/RT  +/-adjuvantchemotherapy following chemo/RT. patient seen 5 cycles of weekly cisplatin chemotherapy with the last dose which was given on 04/30/2019.  Chemotherapy has been on hold due to poor nutrition and ongoing fatigue  Interval history-he continues to do poorly.  Still has pain during swallowing.  He is not at goal with his tube feeds.  Weight is 2 pounds up as compared to 10 days ago.  Has ongoing symptoms of nausea and constipation.  Admits to being depressed  ECOG PS- 2 Pain scale- 4 Opioid associated constipation- yes  Review of systems- Review of Systems  Constitutional: Positive for malaise/fatigue and weight loss. Negative for chills and fever.  HENT: Negative for congestion, ear discharge and nosebleeds.   Eyes: Negative for blurred vision.  Respiratory: Negative for cough, hemoptysis, sputum production, shortness of breath and wheezing.   Cardiovascular: Negative for chest pain, palpitations, orthopnea and claudication.  Gastrointestinal: Positive for constipation and nausea. Negative for abdominal pain, blood in stool, diarrhea, heartburn, melena and vomiting.       Pain during swallowing  Genitourinary: Negative for dysuria, flank pain, frequency, hematuria and urgency.  Musculoskeletal: Negative for back pain, joint pain and myalgias.  Skin: Negative for rash.  Neurological: Negative for dizziness, tingling, focal weakness, seizures, weakness and headaches.  Endo/Heme/Allergies: Does not bruise/bleed easily.  Psychiatric/Behavioral: Negative for depression and suicidal ideas. The patient does not have insomnia.       No Known Allergies   Past Medical History:  Diagnosis Date  . Anxiety   . Basal cell carcinoma   . Cigarette smoker   . Dental decay   . Dysphagia   . Dyspnea   . End stage COPD ([place])  end stage copd, emphysema  . Erectile dysfunction   . Headache    every morning  . Hepatitis C    treated for 8 weeks this past year with Mayvret  . History of  nonmelanoma skin cancer   . Hoarseness, chronic   . Myocardial infarction [facility]) march/april 2020   mild heart attack  . Prostate cancer ([place])   . Respiratory failure, acute ([place])    was inpt for 5 days. thought he was going to die.  . Sleep apnea   . Squamous cell skin cancer   . Throat cancer ([place])   . Use of leuprolide acetate (Lupron)    treated for prostate cancer.   . 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     Past Surgical History:  Procedure Laterality Date  . FACIAL LACERATION REPAIR    . HERNIA REPAIR Left 1992   inguinal  . IR GASTROSTOMY TUBE MOD SED  04/26/2019  . MICROLARYNGOSCOPY N/A 01/17/2019   Procedure: MICROLARYNGOSCOPY WITH BX;  Surgeon: Clyde Canterbury, MD;  Location: [facility] ORS;  Service: ENT;  Laterality: N/A;  . PORTA CATH INSERTION N/A 03/15/2019   Procedure: PORTA CATH INSERTION;  Surgeon: Algernon Huxley, MD;  Location: [place] CV LAB;  Service: Cardiovascular;  Laterality: N/A;  . PROSTATE BIOPSY    . SKIN CANCER EXCISION    . TRIGGER FINGER RELEASE Left 10/2018    Social History   Socioeconomic History  . Marital status: Significant Other    Spouse name: [name], girlfriend  . Number of children: Not on file  . Years of education: Not on file  . Highest education level: Not on file  Occupational History  . Occupation: Chief Strategy Officer    Comment: retired  Tobacco Use  . Smoking status: Current Every Day Smoker    Packs/day: 0.25    Years: 52.00    Pack years: 13.00    Types: Cigarettes  . Smokeless tobacco: Never Used  . Tobacco comment: 5 cigarettes a day  Substance and Sexual Activity  . Alcohol use: Not Currently    Alcohol/week: 0.0 standard drinks  . Drug use: Never  . Sexual activity: Yes  Other Topics Concern  . Not on file  Social History Narrative   Patient has retired d/t respiratory status. Currently lives with girlfriend    Social Determinants of Health   Financial Resource Strain: Low Risk   . Difficulty of  Paying Living Expenses: Not hard at all  Food Insecurity: No Food Insecurity  . Worried About Charity fundraiser in the Last Year: Never true  . Ran Out of Food in the Last Year: Never true  Transportation Needs: No Transportation Needs  . Lack of Transportation (Medical): No  . Lack of Transportation (Non-Medical): No  Physical Activity: Inactive  . Days of Exercise per Week: 0 days  . Minutes of Exercise per Session: 0 min  Stress: Stress Concern Present  . Feeling of Stress : Very much  Social Connections:   . Frequency of Communication with Friends and Family:   . Frequency of Social Gatherings with Friends and Family:   . Attends Religious Services:   . Active Member of Clubs or Organizations:   . Attends Archivist Meetings:   [name] Marital Status:   Intimate Partner Violence:   . Fear of Current or Ex-Partner:   . Emotionally Abused:   [name] Physically Abused:   . Sexually Abused:     Family  History  Problem Relation Age of Onset  . Cancer Sister   . Cancer - Cervical Mother   . Heart attack Brother   . Heart attack Maternal Grandfather      Current Outpatient Medications:  .  clonazePAM (KLONOPIN) 1 MG tablet, Take 1 tablet (1 mg total) by mouth 2 (two) times daily as needed for anxiety., Disp: 60 tablet, Rfl: 2 .  COMBIVENT RESPIMAT 20-100 MCG/ACT AERS respimat, INHALE 1 PUFF BY MOUTH INTO THE LUNGS EVERY 6 HOURS AS NEEDED, Disp: 4 g, Rfl: 0 .  fluticasone (FLONASE) 50 MCG/ACT nasal spray, SHAKE LIQUID AND USE 1 SPRAY IN EACH NOSTRIL TWICE DAILY, Disp: 16 g, Rfl: 1 .  ipratropium-albuterol (DUONEB) 0.5-2.5 (3) MG/3ML SOLN, Take 3 mLs by nebulization every 6 (six) hours as needed., Disp: 360 mL, Rfl: 11 .  lidocaine (XYLOCAINE) 2 % solution, Use as directed 15 mLs in the mouth or throat every 6 (six) hours as needed for mouth pain (for sore throat)., Disp: 100 mL, Rfl: 2 .  lidocaine-prilocaine (EMLA) cream, Apply to affected area once, Disp: 30 g, Rfl: 3 .   OXYGEN, Inhale 3 L into the lungs daily., Disp: , Rfl:  .  prochlorperazine (COMPAZINE) 10 MG tablet, Take 1 tablet (10 mg total) by mouth every 6 (six) hours as needed (Nausea or vomiting)., Disp: 30 tablet, Rfl: 1 .  STIOLTO RESPIMAT 2.5-2.5 MCG/ACT AERS, Inhale 2 puffs into the lungs 2 (two) times daily as needed (shortness of breath). , Disp: , Rfl:  .  citalopram (CELEXA) 20 MG tablet, Take 1 tablet (20 mg total) by mouth daily., Disp: 30 tablet, Rfl: 0 .  dexamethasone (DECADRON) 4 MG tablet, Take 2 tablets by mouth once a day on the day after chemotherapy and then take 2 tablets two times a day for 2 days. Take with food. (Patient not taking: Reported on 05/31/2019), Disp: 30 tablet, Rfl: 1 .  dexamethasone (DECADRON) 4 MG tablet, Take 2 tablets (8 mg total) by mouth daily. With food every day x 1 week (Patient not taking: Reported on 05/31/2019), Disp: 7 tablet, Rfl: 0 .  metoCLOPramide (REGLAN) 5 MG/5ML solution, Take 10 mLs (10 mg total) by mouth 3 (three) times daily before meals. (Patient not taking: Reported on 05/28/2019), Disp: 473 mL, Rfl: 0 .  naloxegol oxalate (MOVANTIK) 25 MG TABS tablet, Take 1 tablet (25 mg total) by mouth daily. (Patient not taking: Reported on 05/31/2019), Disp: 30 tablet, Rfl: 0 .  Nutritional Supplements (FEEDING SUPPLEMENT, OSMOLITE 1.5 CAL,) LIQD, Give 5 cartons per day via feeding tube (1 1/2 cartons at 8am and noon and 1 carton at 4pm and 8pm).  Flush with 79m of water before and after each feeding.  Provides 1775 calories, 74.5 g protein and 18034mfree water and meets 100% of nutritional needs.  Send bolus supplies., Disp: 1185 mL, Rfl: 3 .  ondansetron (ZOFRAN) 8 MG tablet, Take 1 tablet (8 mg total) by mouth 2 (two) times daily as needed. Start on the third day after chemotherapy. (Patient not taking: Reported on 05/31/2019), Disp: 30 tablet, Rfl: 1 .  oxyCODONE (OXYCONTIN) 10 mg 12 hr tablet, Take 1 tablet (10 mg total) by mouth every 12 (twelve) hours., Disp:  60 tablet, Rfl: 0 .  Oxycodone HCl 10 MG TABS, Take 1 tablet (10 mg total) by mouth every 4 (four) hours as needed., Disp: 90 tablet, Rfl: 0 .  pantoprazole (PROTONIX) 20 MG tablet, Take 1 tablet (20 mg total) by mouth  daily. (Patient not taking: Reported on 05/14/2019), Disp: 30 tablet, Rfl: 2 .  polyethylene glycol powder (CLEARLAX) 17 GM/SCOOP powder, Take 1 Container by mouth once., Disp: , Rfl:  .  potassium & sodium phosphates (PHOS-NAK) 280-160-250 MG PACK, Take 1 packet by mouth 2 (two) times daily. For hypophosphatemia. Give via PEG. (Patient not taking: Reported on 05/31/2019), Disp: 120 each, Rfl: 0 .  sennosides (SENOKOT) 8.8 MG/5ML syrup, Take 5 mLs by mouth at bedtime. (Patient not taking: Reported on 05/31/2019), Disp: 240 mL, Rfl: 3  Physical exam:  Vitals:   05/31/19 0908  BP: (!) 123/56  Pulse: (!) 53  Resp: 16  Temp: (!) 95.6 F (35.3 C)  TempSrc: Tympanic  Weight: 97 lb 1.6 oz (44 kg)  Height: '5\' 6"'  (1.676 m)   Physical Exam Constitutional:      General: He is not in acute distress.    Comments: Patient is thin frail and cachectic  Cardiovascular:     Rate and Rhythm: Normal rate and regular rhythm.     Heart sounds: Normal heart sounds.  Pulmonary:     Comments: He is on oxygen.  Effort is mildly increased.  Breath sounds diffusely decreased over bases Abdominal:     General: Bowel sounds are normal.     Palpations: Abdomen is soft.     Comments: PEG tube in place  Skin:    General: Skin is warm and dry.  Neurological:     Mental Status: He is alert and oriented to person, place, and time.      CMP Latest Ref Rng & Units 05/31/2019  Glucose 70 - 99 mg/dL 162(H)  BUN 8 - 23 mg/dL 15  Creatinine 0.61 - 1.24 mg/dL 0.49(L)  Sodium 135 - 145 mmol/L 132(L)  Potassium 3.5 - 5.1 mmol/L 4.1  Chloride 98 - 111 mmol/L 91(L)  CO2 22 - 32 mmol/L 33(H)  Calcium 8.9 - 10.3 mg/dL 8.6(L)  Total Protein 6.5 - 8.1 g/dL 6.2(L)  Total Bilirubin 0.3 - 1.2 mg/dL 0.5    Alkaline Phos 38 - 126 U/L 50  AST 15 - 41 U/L 15  ALT 0 - 44 U/L 13   CBC Latest Ref Rng & Units 05/31/2019  WBC 4.0 - 10.5 K/uL 4.7  Hemoglobin 13.0 - 17.0 g/dL 8.5(L)  Hematocrit 39.0 - 52.0 % 26.1(L)  Platelets 150 - 400 K/uL 439(H)     Assessment and plan- Patient is a 69 y.o. 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  This is a routine follow-up visit  1.  Patient will complete his radiation treatment today.  He received 5 weekly cycles of cisplatin chemotherapy but has not received chemo for over 4 weeks now.  No plans for chemotherapy at this time.  Repeat PET CT scan in 6 weeks time to assess response to treatment  2.  Anemia: Likely secondary to combination of malignancy and prior chemotherapy.  Stable around 8.  No evidence of iron deficiency.  B12 level is mildly low at 260.  I have asked him to take an oral multivitamin.  He does not wish to take B12 shot.  3.  Severe protein calorie nonmalnutrition: Being followed by nutrition.  He is still currently not at goal with regards to his PEG tube feeds.  4.  Constipation: States that MiraLAX and senna did not help him in the past.  He is trying as needed enemas.  Movantik made  him nauseous but he is willing to try one more time after taking nausea medications  5.  Nausea: Likely secondary to fullness from PEG tube feeds.  He has not received chemotherapy for over 4 weeks and therefore I do not feel that his nausea is related to cisplatin.  I have asked him to take scheduled Compazine every 6 hours before a planned PEG tube feed.  We have tried Reglan in the past which did not work for him.  6.  Neoplasm related pain: I have renewed his OxyContin and oxycodone  7.  Depression: There may be an element of depression with his overall condition and ongoing weight loss.  I will give him a trial of citalopram 20 mg daily  I will see him back in 3 weeks time  with CBC with differential CMP mag and phos levels   Visit Diagnosis 1. Squamous cell carcinoma of larynx (HCC)   2. Neoplasm related pain   3. Severe protein-calorie malnutrition Altamease Oiler: less than 60% of standard weight) ([place])   4. Depression, unspecified depression type   5. Normocytic anemia      Dr. Randa Evens, MD, MPH [facility] [phone] 06/01/2019 11:41 AM

## 2019-06-01 NOTE — Telephone Encounter (Signed)
[name] called reporting that patient was to have an antidepressant and Oxycodone prescription sent to pharmacy yesterday and they do not have either one. Please advise

## 2019-06-05 ENCOUNTER — Encounter: Payer: Self-pay | Admitting: Nurse Practitioner

## 2019-06-05 ENCOUNTER — Other Ambulatory Visit: Payer: Self-pay

## 2019-06-05 ENCOUNTER — Ambulatory Visit (INDEPENDENT_AMBULATORY_CARE_PROVIDER_SITE_OTHER): Payer: Medicare Other | Admitting: Nurse Practitioner

## 2019-06-05 DIAGNOSIS — F17210 Nicotine dependence, cigarettes, uncomplicated: Secondary | ICD-10-CM

## 2019-06-05 DIAGNOSIS — D692 Other nonthrombocytopenic purpura: Secondary | ICD-10-CM

## 2019-06-05 DIAGNOSIS — C329 Malignant neoplasm of larynx, unspecified: Secondary | ICD-10-CM

## 2019-06-05 DIAGNOSIS — J449 Chronic obstructive pulmonary disease, unspecified: Secondary | ICD-10-CM | POA: Diagnosis not present

## 2019-06-05 DIAGNOSIS — E440 Moderate protein-calorie malnutrition: Secondary | ICD-10-CM

## 2019-06-05 DIAGNOSIS — F411 Generalized anxiety disorder: Secondary | ICD-10-CM | POA: Diagnosis not present

## 2019-06-05 NOTE — Assessment & Plan Note (Signed)
Ongoing.  Continue collaboration with oncology and appreciate their input.  REcent notes reviewed.

## 2019-06-05 NOTE — Patient Instructions (Signed)

## 2019-06-05 NOTE — Assessment & Plan Note (Signed)
Ongoing with cancer treatments.  Continue supplements via feeding tube as instructed by oncology and continue collaboration with oncology and palliative.

## 2019-06-05 NOTE — Assessment & Plan Note (Signed)
I have recommended complete cessation of tobacco use. I have discussed various options available for assistance with tobacco cessation including over the counter methods (Nicotine gum, patch and lozenges). We also discussed prescription options (Chantix, Nicotine Inhaler / Nasal Spray). The patient is not interested in pursuing any prescription tobacco cessation options at this time.  

## 2019-06-05 NOTE — Assessment & Plan Note (Signed)
In presence of severe COPD and protein calorie malnutrition.  Discussed proper skin care, with gentle cleansing and use of Lubriderm lotion daily.  Monitor for skin breakdown and alert provider if present. 

## 2019-06-05 NOTE — Progress Notes (Signed)
BP 99/61   Pulse 81   Temp 98.6 F (37 C) (Oral)   Wt 95 lb (43.1 kg)   SpO2 93%   BMI 15.33 kg/m    Subjective:    Patient ID: Kevin Nelson, male    DOB: Sep 22, 1950, 69 y.o.   MRN: [id number]  HPI: Kevin Nelson is a 69 y.o. male  Chief Complaint  Patient presents with  . Anxiety   COPD Followed by pulmonary, last saw at [facility] on 02/19/2019.  Continues Combivent, Stiolto, and Duoneb.  Has diagnosis of laryngeal cancer, being followed by oncology and palliative team and receiving treatments with last infusion on 05/31/19.  Has a feeding tube in place at this time and is taking in 3 supplements a day.   COPD status: stable Satisfied with current treatment?: yes Oxygen use: yes Dyspnea frequency: occasional Cough frequency: chronic, no worsening Rescue inhaler frequency:  3-4 times a week, sometimes more Limitation of activity: at times Productive cough: none Last Spirometry: with pulmonary Pneumovax: refused Influenza: refused   ANXIETY/STRESS Followed by palliative team at this time due to co morbidities, new diagnosis laryngeal CA.  Is taking Klonopin 1 MG BID PRN and Celexa 20 MG QDAY.  He reports benefit from this regimen.  Risks, benefits, side effects and alternative therapies were discussed.  The opportunity to ask questions was provided and they were answered to the best of my ability.  The patient expressed understanding and willingness to follow the outlined treatment protocols and was able to verbalize plan of care back to provider. Duration:stable Anxious mood: yes  Excessive worrying: yes Irritability: no  Sweating: no Nausea: no Palpitations:no Hyperventilation: no Panic attacks: no Agoraphobia: no  Obscessions/compulsions: no Depressed mood: yes Depression screen [facility] 2/9 06/05/2019 03/05/2019 01/10/2019 07/31/2018  Decreased Interest 1 3 0 3  Down, Depressed, Hopeless 1 3 1 3   PHQ - 2 Score 2 6 1 6   Altered sleeping 3 3 - 3  Tired, decreased energy 1 3 - 3   Change in appetite 3 1 - 3  Feeling bad or failure about yourself  0 0 - 0  Trouble concentrating 1 0 - 1  Moving slowly or fidgety/restless 2 0 - 3  Suicidal thoughts 0 1 - 0  PHQ-9 Score 12 14 - 19  Difficult doing work/chores Not difficult at all Somewhat difficult - Somewhat difficult  Some recent data might be hidden   Anhedonia: no Weight changes: no Insomnia: yes hard to fall asleep  Hypersomnia: no Fatigue/loss of energy: no Feelings of worthlessness: no Feelings of guilt: no Impaired concentration/indecisiveness: yes Suicidal ideations: no  Crying spells: no Recent Stressors/Life Changes: no   Relationship problems: no   Family stress: no     Financial stress: no    Job stress: no    Recent death/loss: no GAD 7 : Generalized Anxiety Score 06/05/2019 03/05/2019 07/31/2018 01/27/2018  Nervous, Anxious, on Edge 3 3 3 1   Control/stop worrying 2 3 3 1   Worry too much - different things 2 3 3 1   Trouble relaxing 2 0 3 1  Restless 2 0 1 0  Easily annoyed or irritable 2 1 3  0  Afraid - awful might happen 2 1 3 1   Total GAD 7 Score 15 11 19 5   Anxiety Difficulty Somewhat difficult Somewhat difficult Somewhat difficult Somewhat difficult    Relevant past medical, surgical, family and social history reviewed and updated as indicated. Interim medical history since our last visit reviewed. Allergies and  medications reviewed and updated.  Review of Systems  Constitutional: Negative for activity change, diaphoresis, fatigue and fever.  Respiratory: Positive for cough (baseline) and wheezing (baseline). Negative for chest tightness and shortness of breath.   Cardiovascular: Negative for chest pain, palpitations and leg swelling.  Gastrointestinal: Negative.   Psychiatric/Behavioral: Negative.     Per HPI unless specifically indicated above     Objective:    BP 99/61   Pulse 81   Temp 98.6 F (37 C) (Oral)   Wt 95 lb (43.1 kg)   SpO2 93%   BMI 15.33 kg/m   Wt  Readings from Last 3 Encounters:  06/05/19 95 lb (43.1 kg)  05/31/19 97 lb 1.6 oz (44 kg)  05/21/19 95 lb 11.2 oz (43.4 kg)    Physical Exam Vitals and nursing note reviewed.  Constitutional:      General: He is awake. He is not in acute distress.    Appearance: He is well-developed and well-groomed. He is cachectic. He is not ill-appearing.     Comments: Frail appearing.  HENT:     Head: Normocephalic and atraumatic.     Right Ear: Hearing normal. No drainage.     Left Ear: Hearing normal. No drainage.  Eyes:     General: Lids are normal.        Right eye: No discharge.        Left eye: No discharge.     Conjunctiva/sclera: Conjunctivae normal.     Pupils: Pupils are equal, round, and reactive to light.  Neck:     Vascular: No carotid bruit.  Cardiovascular:     Rate and Rhythm: Normal rate and regular rhythm.     Heart sounds: Normal heart sounds, S1 normal and S2 normal. No murmur. No gallop.   Pulmonary:     Effort: Pulmonary effort is normal. No accessory muscle usage or respiratory distress.     Breath sounds: Decreased breath sounds present.     Comments: Decreased breath sounds throughout, clear.  O2 on at 2L . Abdominal:     General: Bowel sounds are normal.     Palpations: Abdomen is soft.  Musculoskeletal:        General: Normal range of motion.     Cervical back: Normal range of motion and neck supple.     Right lower leg: No edema.     Left lower leg: No edema.  Skin:    General: Skin is warm and dry.     Comments: Scattered small pale purple bruises bilateral upper extremities.  Neurological:     Mental Status: He is alert and oriented to person, place, and time.     Deep Tendon Reflexes: Reflexes are normal and symmetric.  Psychiatric:        Attention and Perception: Attention normal.        Mood and Affect: Mood normal.        Speech: Speech normal.        Behavior: Behavior normal. Behavior is cooperative.        Thought Content: Thought content  normal.    Results for orders placed or performed in visit on 05/31/19  Phosphorus  Result Value Ref Range   Phosphorus 3.5 2.5 - 4.6 mg/dL  Magnesium  Result Value Ref Range   Magnesium 1.8 1.7 - 2.4 mg/dL  Comprehensive metabolic panel  Result Value Ref Range   Sodium 132 (L) 135 - 145 mmol/L   Potassium 4.1 3.5 - 5.1 mmol/L  Chloride 91 (L) 98 - 111 mmol/L   CO2 33 (H) 22 - 32 mmol/L   Glucose, Bld 162 (H) 70 - 99 mg/dL   BUN 15 8 - 23 mg/dL   Creatinine, Ser 0.49 (L) 0.61 - 1.24 mg/dL   Calcium 8.6 (L) 8.9 - 10.3 mg/dL   Total Protein 6.2 (L) 6.5 - 8.1 g/dL   Albumin 3.2 (L) 3.5 - 5.0 g/dL   AST 15 15 - 41 U/L   ALT 13 0 - 44 U/L   Alkaline Phosphatase 50 38 - 126 U/L   Total Bilirubin 0.5 0.3 - 1.2 mg/dL   GFR calc non Af Amer >60 >60 mL/min   GFR calc Af Amer >60 >60 mL/min   Anion gap 8 5 - 15  CBC with Differential/Platelet  Result Value Ref Range   WBC 4.7 4.0 - 10.5 K/uL   RBC 2.93 (L) 4.22 - 5.81 MIL/uL   Hemoglobin 8.5 (L) 13.0 - 17.0 g/dL   HCT 26.1 (L) 39.0 - 52.0 %   MCV 89.1 80.0 - 100.0 fL   MCH 29.0 26.0 - 34.0 pg   MCHC 32.6 30.0 - 36.0 g/dL   RDW 17.4 (H) 11.5 - 15.5 %   Platelets 439 (H) 150 - 400 K/uL   nRBC 0.0 0.0 - 0.2 %   Neutrophils Relative % 63 %   Neutro Abs 3.0 1.7 - 7.7 K/uL   Lymphocytes Relative 10 %   Lymphs Abs 0.5 (L) 0.7 - 4.0 K/uL   Monocytes Relative 22 %   Monocytes Absolute 1.0 0.1 - 1.0 K/uL   Eosinophils Relative 2 %   Eosinophils Absolute 0.1 0.0 - 0.5 K/uL   Basophils Relative 1 %   Basophils Absolute 0.0 0.0 - 0.1 K/uL   Immature Granulocytes 2 %   Abs Immature Granulocytes 0.11 (H) 0.00 - 0.07 K/uL      Assessment & Plan:   Problem List Items Addressed This Visit      Cardiovascular and Mediastinum   Senile purpura (HCC)    In presence of severe COPD and protein calorie malnutrition.  Discussed proper skin care, with gentle cleansing and use of Lubriderm lotion daily.  Monitor for skin breakdown and alert  provider if present.        Respiratory   COPD, very severe ([place])    Chronic, ongoing with O2 dependence.  Continue current medication regimen and collaboration with pulmonology.  Recommend complete smoking cessation, although not interested in this at this time.      Squamous cell carcinoma of larynx (HCC)    Ongoing.  Continue collaboration with oncology and appreciate their input.  REcent notes reviewed.        Other   Generalized anxiety disorder    Chronic, ongoing.  Denies SI/HI.  Will continue current palliative regimen.  Patient aware of long term risks of benzos, wishes to continue at this time.  Recent refills sent.  Continue to collaborate with palliative team.  Return in 3 months.      Nicotine dependence, cigarettes, uncomplicated    I have recommended complete cessation of tobacco use. I have discussed various options available for assistance with tobacco cessation including over the counter methods (Nicotine gum, patch and lozenges). We also discussed prescription options (Chantix, Nicotine Inhaler / Nasal Spray). The patient is not interested in pursuing any prescription tobacco cessation options at this time.       Protein-calorie malnutrition ([place])    Ongoing with cancer treatments.  Continue supplements via feeding tube as instructed by oncology and continue collaboration with oncology and palliative.          Follow up plan: Return in about 3 months (around 09/05/2019) for Anxiety and COPD.

## 2019-06-05 NOTE — Assessment & Plan Note (Signed)
Chronic, ongoing.  Denies SI/HI.  Will continue current palliative regimen.  Patient aware of long term risks of benzos, wishes to continue at this time.  Recent refills sent.  Continue to collaborate with palliative team.  Return in 3 months. 

## 2019-06-05 NOTE — Assessment & Plan Note (Signed)
Chronic, ongoing with O2 dependence.  Continue current medication regimen and collaboration with pulmonology.  Recommend complete smoking cessation, although not interested in this at this time. 

## 2019-06-07 ENCOUNTER — Telehealth: Payer: Self-pay | Admitting: *Deleted

## 2019-06-07 ENCOUNTER — Other Ambulatory Visit: Payer: Self-pay | Admitting: *Deleted

## 2019-06-07 MED ORDER — OLANZAPINE 10 MG PO TABS: 10.0000 mg | ORAL_TABLET | Freq: Every day | ORAL | 2 refills | Status: DC

## 2019-06-07 NOTE — Telephone Encounter (Signed)
Dr. Janese Banks said to give him zyprexa 10 mg at night. I will send it in and call pt

## 2019-06-07 NOTE — Telephone Encounter (Signed)
[name] called reporting that patient is having constant nausea and that he states he just can't keep going this way. He is taking the medicine as ordered, but it is not controlling it. Please advise

## 2019-06-07 NOTE — Telephone Encounter (Signed)
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 She will go pick it up . When he is sick on his stomach he does not want to eat. She got him pedialite and he drank some and she got him the popsicles too and he did not like them. She will get rx and try it

## 2019-06-12 ENCOUNTER — Ambulatory Visit (INDEPENDENT_AMBULATORY_CARE_PROVIDER_SITE_OTHER): Payer: Medicare Other | Admitting: General Practice

## 2019-06-12 ENCOUNTER — Telehealth: Payer: Medicare Other | Admitting: General Practice

## 2019-06-12 ENCOUNTER — Other Ambulatory Visit: Payer: Self-pay | Admitting: Nurse Practitioner

## 2019-06-12 DIAGNOSIS — K029 Dental caries, unspecified: Secondary | ICD-10-CM

## 2019-06-12 DIAGNOSIS — E440 Moderate protein-calorie malnutrition: Secondary | ICD-10-CM

## 2019-06-12 DIAGNOSIS — F411 Generalized anxiety disorder: Secondary | ICD-10-CM

## 2019-06-12 DIAGNOSIS — J449 Chronic obstructive pulmonary disease, unspecified: Secondary | ICD-10-CM

## 2019-06-12 DIAGNOSIS — F17210 Nicotine dependence, cigarettes, uncomplicated: Secondary | ICD-10-CM

## 2019-06-12 DIAGNOSIS — C329 Malignant neoplasm of larynx, unspecified: Secondary | ICD-10-CM

## 2019-06-12 NOTE — Chronic Care Management (AMB) (Signed)
Chronic Care Management   Follow Up Note   06/12/2019 Name: Kevin Nelson MRN: [id number] DOB: [birth year]  Referred by: [name], NP Reason for referral : Chronic Care Management (Follow up: COPD/Laryngeal cancer/prostate cancer/other concerns)   Kevin Nelson is a 69 y.o. year old male who is a primary care patient of Cannady, [name], NP. The CCM team was consulted for assistance with chronic disease management and care coordination needs.    Review of patient status, including review of consultants reports, relevant laboratory and other test results, and collaboration with appropriate care team members and the patient's provider was performed as part of comprehensive patient evaluation and provision of chronic care management services.    SDOH (Social Determinants of Health) assessments performed: Yes See Care Plan activities for detailed interventions related to [facility])     Outpatient Encounter Medications as of 06/12/2019  Medication Sig Note   citalopram (CELEXA) 20 MG tablet Take 1 tablet (20 mg total) by mouth daily.    clonazePAM (KLONOPIN) 1 MG tablet Take 1 tablet (1 mg total) by mouth 2 (two) times daily as needed for anxiety.    COMBIVENT RESPIMAT 20-100 MCG/ACT AERS respimat INHALE 1 PUFF BY MOUTH INTO THE LUNGS EVERY 6 HOURS AS NEEDED    fluticasone (FLONASE) 50 MCG/ACT nasal spray SHAKE LIQUID AND USE 1 SPRAY IN EACH NOSTRIL TWICE DAILY    ipratropium-albuterol (DUONEB) 0.5-2.5 (3) MG/3ML SOLN Take 3 mLs by nebulization every 6 (six) hours as needed. 05/09/2019: Using 3-4 times daily   naloxegol oxalate (MOVANTIK) 25 MG TABS tablet Take 1 tablet (25 mg total) by mouth daily. (Patient not taking: Reported on 05/31/2019)    Nutritional Supplements (FEEDING SUPPLEMENT, OSMOLITE 1.5 CAL,) LIQD Give 5 cartons per day via feeding tube (1 1/2 cartons at 8am and noon and 1 carton at 4pm and 8pm).  Flush with 55ml of water before and after each feeding.  Provides 1775  calories, 74.5 g protein and 1872ml free water and meets 100% of nutritional needs.  Send bolus supplies.    OLANZapine (ZYPREXA) 10 MG tablet Take 1 tablet (10 mg total) by mouth at bedtime.    oxyCODONE (OXYCONTIN) 10 mg 12 hr tablet Take 1 tablet (10 mg total) by mouth every 12 (twelve) hours.    Oxycodone HCl 10 MG TABS Take 1 tablet (10 mg total) by mouth every 4 (four) hours as needed.    OXYGEN Inhale 3 L into the lungs daily.    pantoprazole (PROTONIX) 20 MG tablet Take 1 tablet (20 mg total) by mouth daily.    polyethylene glycol powder (CLEARLAX) 17 GM/SCOOP powder Take 1 Container by mouth once.    potassium & sodium phosphates (PHOS-NAK) 280-160-250 MG PACK Take 1 packet by mouth 2 (two) times daily. For hypophosphatemia. Give via PEG.    prochlorperazine (COMPAZINE) 10 MG tablet Take 1 tablet (10 mg total) by mouth every 6 (six) hours as needed (Nausea or vomiting).    STIOLTO RESPIMAT 2.5-2.5 MCG/ACT AERS Inhale 2 puffs into the lungs 2 (two) times daily as needed (shortness of breath).     No facility-administered encounter medications on file as of 06/12/2019.     Objective:  BP Readings from Last 3 Encounters:  06/05/19 99/61  05/31/19 (!) 123/56  05/28/19 (!) 112/55    Goals Addressed            This Visit's Progress    RN-COPD Management/Laryngeal Cancer diagnosis (pt-stated)       Current  Barriers:   Knowledge deficits related to basic COPD self care/management  Knowledge deficits related to Laryngeal Cancer and self/care management   Financial barriers  Limited Social Support   Case Manager Clinical Goal(s):  Over the next 90 days, patient will be able to verbalize understanding of COPD action plan and when to seek appropriate levels of medical care  Over the next 90 days, patient will engage in lite exercise as tolerated to build/regain stamina and strength and reduce shortness of breath through activity tolerance  Over the next 90 days,  patient will verbalize basic understanding of COPD disease process and self care activities  Over the next 90 days the patient witl verbalized basic understanding of Laryngeal Cancer disease process and self care activities    Interventions:   Reviewed COPD action plan  Patient reports having all COPD medications  Reviewed upcoming appointment with Pulmonology.  The patient is doing some "soul searching" and talking to his fiance about important decisions that need to be made concerning his health and well being/care  Patient reporting he is taking his medications as directed  Continues to smoke, with no interest in quitting   Continues to be on 02  Evaluation of current status of Laryngeal cancer and treatment options.  The patient states the "doctor" has told him he only has 6 months to live.  He is unsure at this time whether he will have surgery or not. He is discussing this "big" decision with his fiance "[name]"  The patient has completed his chemotherapy regimen and is now taking radiation. The patient is feeling much better.  The patient does want to work on getting his teeth fixed and dentures now. Ask if the [facility] could ask [name] to resend referral for [facility] for dental needs. Will send an in-basket message to pcp and ask to resubmit referral.   Evaluation of smoking. The patient verbalized he is down to 5 cigarettes a day.  The patient admits that is not enough but it is better than what it was. Praised the patient for small accomplishments. The patient was happier on the call today and thanked the [facility] for ongoing support and help.    Patient Self Care Activities:  Takes medications as prescribed including inhalers  Utilizes infection prevention strategies to reduce risk of respiratory infection   Does not currently perform self care activities related to COPD management  Please see past updates related to this goal by clicking on the "Past Updates" button in the  selected goal       RN: I have some big decisions to make       Current Barriers:   Financial Constraints.   Chronic Disease Management support and education needs related to Chronic health conditions impacting the patients care, currently having to decide about treatment options for Laryngeal cancer and the patients concern of "quality of life vs quantity of life"  No Advanced Directives in place  Nurse Case Manager Clinical Goal(s):   Over the next 120 days, patient will work with pcp and CCM team to address needs related to prognosis of patient with chronic medical conditions/terminal illness diagnosis  Over the next 120 days, patient will attend all scheduled medical appointments: patient has been actively taking chemotherapy but has decided to take a week off.  Over the next 120 days, patient will demonstrate improved health management independence as evidenced byremaining independent with his care and ability to make decisions about his health and well being independently  Over  the next 90 days, patient will work with CM team pharmacist to manage medications  Over the next 90 days, patient will work with CM clinical social worker to decrease stress and anxiety and assist as needed with financial needs  Over the next 90 days, the patient will work with the care management team towards completion of advanced directives  Interventions:   Evaluation of current treatment plan related to chronic medical conditions/terminal illness diagnosis and patient's adherence to plan as established by provider. The patient has completed his chemotherapy at this time. He is still taking radiation. They have not been able to do a PET scan per the patient.  The patient states he is feeling much better.   Provided education to patient and/or caregiver about advanced directives  Provided education to patient re: giving the patient samples of Ensure to try to see if this is something that will help  with his nutritional health and goals.  The patient states he has sores in his mouth and this is preventing him from eating or drinking much of anything.  The sores are an ongoing issue but he states today they are better. The patient was having a lot of nausea but he is now taking the "nausea" medication and it has been very helpful for him.  He is still having to take 80% of his nutrition by his peg tube but he is feeling much better and states the nausea medication has helped him so much.   Reviewed medications with patient and discussed compliance  Collaborated with CCM team regarding ongoing support and needs related to complex chronic medical conditions and terminal illness diagnosis.  The patient is feeling much better and states he is even getting out with his fiance and they are enjoying things.   Discussed plans with patient for ongoing care management follow up and provided patient with direct contact information for care management team  Reviewed scheduled/upcoming provider appointments including: saw pcp on 06/05/2019. Next appointment with pcp on 08/31/2019  Social Work referral for continued support for emotional and financial needs  Pharmacy referral for continued medication management and support  Patient Self Care Activities:   Patient verbalizes understanding of plan to assist in management of chronic medical conditions and terminal illness diagnosis  Self administers medications as prescribed  Attends all scheduled provider appointments  Calls provider office for new concerns or questions  Unable to independently decide the best course of action for his chronic medical condition and terminal illness diagnosis   Please see past updates related to this goal by clicking on the "Past Updates" button in the selected goal          Plan:   The care management team will reach out to the patient again over the next 60 days.    Noreene Larsson RN, MSN, [facility] Mobile: [phone]

## 2019-06-12 NOTE — Patient Instructions (Signed)
Visit Information  Goals Addressed            This Visit's Progress   . RN-COPD Management/Laryngeal Cancer diagnosis (pt-stated)       Current Barriers:  [name] Knowledge deficits related to basic COPD self care/management . Knowledge deficits related to Laryngeal Cancer and self/care management  . Financial barriers . Limited Social Support   Case Manager Clinical Goal(s):  Over the next 90 days, patient will be able to verbalize understanding of COPD action plan and when to seek appropriate levels of medical care  Over the next 90 days, patient will engage in lite exercise as tolerated to build/regain stamina and strength and reduce shortness of breath through activity tolerance  Over the next 90 days, patient will verbalize basic understanding of COPD disease process and self care activities  Over the next 90 days the patient witl verbalized basic understanding of Laryngeal Cancer disease process and self care activities    Interventions:  . Reviewed COPD action plan . Patient reports having all COPD medications . Reviewed upcoming appointment with Pulmonology.  The patient is doing some "soul searching" and talking to his fiance about important decisions that need to be made concerning his health and well being/care . Patient reporting he is taking his medications as directed . Continues to smoke, with no interest in quitting  . Continues to be on 02 . Evaluation of current status of Laryngeal cancer and treatment options.  The patient states the "doctor" has told him he only has 6 months to live.  He is unsure at this time whether he will have surgery or not. He is discussing this "big" decision with his fiance "[name]" . The patient has completed his chemotherapy regimen and is now taking radiation. The patient is feeling much better.  The patient does want to work on getting his teeth fixed and dentures now. Ask if the [facility] could ask [name] to resend referral for [facility] for  dental needs. Will send an in-basket message to pcp and ask to resubmit referral.  . Evaluation of smoking. The patient verbalized he is down to 5 cigarettes a day.  The patient admits that is not enough but it is better than what it was. Praised the patient for small accomplishments. The patient was happier on the call today and thanked the [facility] for ongoing support and help.    Patient Self Care Activities:  Takes medications as prescribed including inhalers  Utilizes infection prevention strategies to reduce risk of respiratory infection   Does not currently perform self care activities related to COPD management  Please see past updates related to this goal by clicking on the "Past Updates" button in the selected goal      . RN: I have some big decisions to make       Current Barriers:  . Financial Constraints.  . Chronic Disease Management support and education needs related to Chronic health conditions impacting the patients care, currently having to decide about treatment options for Laryngeal cancer and the patients concern of "quality of life vs quantity of life" . No Advanced Directives in place  Nurse Case Manager Clinical Goal(s):  [name] Over the next 120 days, patient will work with pcp and CCM team to address needs related to prognosis of patient with chronic medical conditions/terminal illness diagnosis . Over the next 120 days, patient will attend all scheduled medical appointments: patient has been actively taking chemotherapy but has decided to take a week off. [name]  Over the next 120 days, patient will demonstrate improved health management independence as evidenced byremaining independent with his care and ability to make decisions about his health and well being independently . Over the next 90 days, patient will work with CM team pharmacist to manage medications . Over the next 90 days, patient will work with CM clinical social worker to decrease stress and anxiety and assist  as needed with financial needs . Over the next 90 days, the patient will work with the care management team towards completion of advanced directives  Interventions:  . Evaluation of current treatment plan related to chronic medical conditions/terminal illness diagnosis and patient's adherence to plan as established by provider. The patient has completed his chemotherapy at this time. He is still taking radiation. They have not been able to do a PET scan per the patient.  The patient states he is feeling much better.  . Provided education to patient and/or caregiver about advanced directives . Provided education to patient re: giving the patient samples of Ensure to try to see if this is something that will help with his nutritional health and goals.  The patient states he has sores in his mouth and this is preventing him from eating or drinking much of anything.  The sores are an ongoing issue but he states today they are better. The patient was having a lot of nausea but he is now taking the "nausea" medication and it has been very helpful for him.  He is still having to take 80% of his nutrition by his peg tube but he is feeling much better and states the nausea medication has helped him so much.  . Reviewed medications with patient and discussed compliance . Collaborated with CCM team regarding ongoing support and needs related to complex chronic medical conditions and terminal illness diagnosis.  The patient is feeling much better and states he is even getting out with his fiance and they are enjoying things.  . Discussed plans with patient for ongoing care management follow up and provided patient with direct contact information for care management team . Reviewed scheduled/upcoming provider appointments including: saw pcp on 06/05/2019. Next appointment with pcp on 08/31/2019 . Social Work referral for continued support for emotional and financial needs . Pharmacy referral for continued medication  management and support  Patient Self Care Activities:  . Patient verbalizes understanding of plan to assist in management of chronic medical conditions and terminal illness diagnosis . Self administers medications as prescribed . Attends all scheduled provider appointments . Calls provider office for new concerns or questions . Unable to independently decide the best course of action for his chronic medical condition and terminal illness diagnosis   Please see past updates related to this goal by clicking on the "Past Updates" button in the selected goal         Patient verbalizes understanding of instructions provided today.   The care management team will reach out to the patient again over the next 60 days.   Noreene Larsson RN, MSN, [facility] Mobile: [phone]

## 2019-06-12 NOTE — Progress Notes (Signed)
Dental referral

## 2019-06-13 ENCOUNTER — Ambulatory Visit: Payer: Medicare Other | Admitting: Licensed Clinical Social Worker

## 2019-06-13 DIAGNOSIS — J9611 Chronic respiratory failure with hypoxia: Secondary | ICD-10-CM

## 2019-06-13 DIAGNOSIS — F17210 Nicotine dependence, cigarettes, uncomplicated: Secondary | ICD-10-CM

## 2019-06-13 DIAGNOSIS — F411 Generalized anxiety disorder: Secondary | ICD-10-CM

## 2019-06-13 DIAGNOSIS — J449 Chronic obstructive pulmonary disease, unspecified: Secondary | ICD-10-CM | POA: Diagnosis not present

## 2019-06-13 NOTE — Chronic Care Management (AMB) (Signed)
Chronic Care Management    Clinical Social Work Follow Up Note  06/13/2019 Name: Kevin Nelson MRN: [id number] DOB: Sep 14, 1950  Kevin Nelson is a 69 y.o. year old male who is a primary care patient of Cannady, [name], NP. The CCM team was consulted for assistance with [employer] .   Review of patient status, including review of consultants reports, other relevant assessments, and collaboration with appropriate care team members and the patient's provider was performed as part of comprehensive patient evaluation and provision of chronic care management services.    SDOH (Social Determinants of Health) assessments performed: Yes    Outpatient Encounter Medications as of 06/13/2019  Medication Sig Note  . citalopram (CELEXA) 20 MG tablet Take 1 tablet (20 mg total) by mouth daily.   . clonazePAM (KLONOPIN) 1 MG tablet Take 1 tablet (1 mg total) by mouth 2 (two) times daily as needed for anxiety.   . COMBIVENT RESPIMAT 20-100 MCG/ACT AERS respimat INHALE 1 PUFF BY MOUTH INTO THE LUNGS EVERY 6 HOURS AS NEEDED   . fluticasone (FLONASE) 50 MCG/ACT nasal spray SHAKE LIQUID AND USE 1 SPRAY IN EACH NOSTRIL TWICE DAILY   . ipratropium-albuterol (DUONEB) 0.5-2.5 (3) MG/3ML SOLN Take 3 mLs by nebulization every 6 (six) hours as needed. 05/09/2019: Using 3-4 times daily  . naloxegol oxalate (MOVANTIK) 25 MG TABS tablet Take 1 tablet (25 mg total) by mouth daily. (Patient not taking: Reported on 05/31/2019)   . Nutritional Supplements (FEEDING SUPPLEMENT, OSMOLITE 1.5 CAL,) LIQD Give 5 cartons per day via feeding tube (1 1/2 cartons at 8am and noon and 1 carton at 4pm and 8pm).  Flush with 70ml of water before and after each feeding.  Provides 1775 calories, 74.5 g protein and 1819ml free water and meets 100% of nutritional needs.  Send bolus supplies.   [name] OLANZapine (ZYPREXA) 10 MG tablet Take 1 tablet (10 mg total) by mouth at bedtime.   [name] oxyCODONE (OXYCONTIN) 10 mg 12 hr tablet Take 1 tablet (10 mg  total) by mouth every 12 (twelve) hours.   . Oxycodone HCl 10 MG TABS Take 1 tablet (10 mg total) by mouth every 4 (four) hours as needed.   . OXYGEN Inhale 3 L into the lungs daily.   . pantoprazole (PROTONIX) 20 MG tablet Take 1 tablet (20 mg total) by mouth daily.   . polyethylene glycol powder (CLEARLAX) 17 GM/SCOOP powder Take 1 Container by mouth once.   . potassium & sodium phosphates (PHOS-NAK) 280-160-250 MG PACK Take 1 packet by mouth 2 (two) times daily. For hypophosphatemia. Give via PEG.   . prochlorperazine (COMPAZINE) 10 MG tablet Take 1 tablet (10 mg total) by mouth every 6 (six) hours as needed (Nausea or vomiting).   . STIOLTO RESPIMAT 2.5-2.5 MCG/ACT AERS Inhale 2 puffs into the lungs 2 (two) times daily as needed (shortness of breath).     No facility-administered encounter medications on file as of 06/13/2019.     Goals Addressed    . SW-"I need more support. I'm anxious all the time" (pt-stated)       Current Barriers:  [name] Knowledge Deficits related to housing options/community resources that are available to him . Financial Constraints - . Health related barriers-cancerous larynx  Clinical Social Worker Clinical Goal(s):  [name] Over the next 90 days, patient will verbalize understanding of plan for gaining crisis support resource education and implementing appropriate self-care tools into his daily routine to improve overall self-care . Over the next 90  days, patient will work with [facility] LCSW to address needs related to increasing self-care and self-care education to cope with financial barriers and stressors.      Interventions: . Patient interviewed and appropriate assessments performed . Provided mental health counseling with regard to coping with daily stress. [name] LCSW provided education on deep breathing and relaxation techniques to implement into his daily routine to combat stressors. Patient admits that his anxiety continues but he has been challenging his  negative thinking in order to gain a more positive perspective. Patient admits that he could benefit from socialization but due to [place] he has been staying at home and taking appropriate precautions. LCSW provided education on available mental health support resources and socialization opportunities within his community. . Provided patient with information about low income housing resources that are available. However, currently Section 8 and Public Housing wait list is still closed.  [name] LCSW provided reflective listening and implemented appropriate interventions to help suppport patient and her emotional needs  . Discussed plans with patient for ongoing care management follow up and provided patient with direct contact information for care management team . Advised patient to follow up with provided community resources as needed . Assisted patient/caregiver with obtaining information about health plan benefits . Patient confirms stable transportation to his upcoming PCP appointment next month. . Patient admits ongoing SOB. LCSW provided education on ways to induce the relaxation body response to help with deep breathing.  . Patient reports that his anxiety is "more manageable" due to medication dosage adjustment/increase.   . Patient reports ongoing dryness of the mouth and dehydration. However, he is currently drinking pedialyte which has been helpful. Healthy self-care education provided.  . Evaluation of current status of Laryngeal cancer and treatment options. The patient states the "doctor" has told him he only has 6 months to live.  . Patient reports that he continues to smoke only 5 cigarettes per day but desires to continue decreasing this intake. Positive reinforcement provided.   Patient Self Care Activities:  . Attends all scheduled provider appointments . Calls provider office for new concerns or questions  Please see past updates related to this goal by clicking on the "Past Updates"  button in the selected goal       Follow Up Plan: SW will follow up with patient by phone over the next quarter  Eula Fried, [place], MSW, [email or web address] Phone: [phone]

## 2019-06-15 ENCOUNTER — Other Ambulatory Visit: Payer: Self-pay | Admitting: Oncology

## 2019-06-15 DIAGNOSIS — C329 Malignant neoplasm of larynx, unspecified: Secondary | ICD-10-CM

## 2019-06-21 ENCOUNTER — Other Ambulatory Visit: Payer: Self-pay

## 2019-06-21 ENCOUNTER — Inpatient Hospital Stay: Payer: Medicare Other

## 2019-06-21 ENCOUNTER — Inpatient Hospital Stay (HOSPITAL_BASED_OUTPATIENT_CLINIC_OR_DEPARTMENT_OTHER): Payer: Medicare Other | Admitting: Oncology

## 2019-06-21 ENCOUNTER — Inpatient Hospital Stay: Payer: Medicare Other | Attending: Oncology

## 2019-06-21 ENCOUNTER — Encounter: Payer: Self-pay | Admitting: Oncology

## 2019-06-21 VITALS — BP 111/64 | HR 80 | Temp 95.3°F | Resp 18 | Wt 96.7 lb

## 2019-06-21 DIAGNOSIS — K5903 Drug induced constipation: Secondary | ICD-10-CM

## 2019-06-21 DIAGNOSIS — E43 Unspecified severe protein-calorie malnutrition: Secondary | ICD-10-CM | POA: Diagnosis not present

## 2019-06-21 DIAGNOSIS — D649 Anemia, unspecified: Secondary | ICD-10-CM

## 2019-06-21 DIAGNOSIS — F32A Depression, unspecified: Secondary | ICD-10-CM

## 2019-06-21 DIAGNOSIS — F329 Major depressive disorder, single episode, unspecified: Secondary | ICD-10-CM | POA: Diagnosis not present

## 2019-06-21 DIAGNOSIS — C321 Malignant neoplasm of supraglottis: Secondary | ICD-10-CM | POA: Diagnosis present

## 2019-06-21 DIAGNOSIS — E538 Deficiency of other specified B group vitamins: Secondary | ICD-10-CM

## 2019-06-21 DIAGNOSIS — R350 Frequency of micturition: Secondary | ICD-10-CM | POA: Insufficient documentation

## 2019-06-21 DIAGNOSIS — C329 Malignant neoplasm of larynx, unspecified: Secondary | ICD-10-CM

## 2019-06-21 DIAGNOSIS — T402X5A Adverse effect of other opioids, initial encounter: Secondary | ICD-10-CM | POA: Diagnosis not present

## 2019-06-21 DIAGNOSIS — G893 Neoplasm related pain (acute) (chronic): Secondary | ICD-10-CM

## 2019-06-21 LAB — COMPREHENSIVE METABOLIC PANEL
ALT: 16 U/L (ref 0–44)
AST: 17 U/L (ref 15–41)
Albumin: 2.9 g/dL — ABNORMAL LOW (ref 3.5–5.0)
Alkaline Phosphatase: 47 U/L (ref 38–126)
Anion gap: 10 (ref 5–15)
BUN: 18 mg/dL (ref 8–23)
CO2: 32 mmol/L (ref 22–32)
Calcium: 8.4 mg/dL — ABNORMAL LOW (ref 8.9–10.3)
Chloride: 97 mmol/L — ABNORMAL LOW (ref 98–111)
Creatinine, Ser: 0.54 mg/dL — ABNORMAL LOW (ref 0.61–1.24)
GFR calc Af Amer: >60 mL/min (ref >60)
GFR calc non Af Amer: >60 mL/min (ref >60)
Glucose, Bld: 130 mg/dL — ABNORMAL HIGH (ref 70–99)
Potassium: 4.2 mmol/L (ref 3.5–5.1)
Sodium: 139 mmol/L (ref 135–145)
Total Bilirubin: 0.6 mg/dL (ref 0.3–1.2)
Total Protein: 6.2 g/dL — ABNORMAL LOW (ref 6.5–8.1)

## 2019-06-21 LAB — CBC WITH DIFFERENTIAL/PLATELET
Abs Immature Granulocytes: 0.14 10*3/uL — ABNORMAL HIGH (ref 0.00–0.07)
Basophils Absolute: 0.1 10*3/uL (ref 0.0–0.1)
Basophils Relative: 1 %
Eosinophils Absolute: 0.1 10*3/uL (ref 0.0–0.5)
Eosinophils Relative: 1 %
HCT: 27.7 % — ABNORMAL LOW (ref 39.0–52.0)
Hemoglobin: 8.8 g/dL — ABNORMAL LOW (ref 13.0–17.0)
Immature Granulocytes: 2 %
Lymphocytes Relative: 9 %
Lymphs Abs: 0.6 10*3/uL — ABNORMAL LOW (ref 0.7–4.0)
MCH: 29 pg (ref 26.0–34.0)
MCHC: 31.8 g/dL (ref 30.0–36.0)
MCV: 91.4 fL (ref 80.0–100.0)
Monocytes Absolute: 1.1 10*3/uL — ABNORMAL HIGH (ref 0.1–1.0)
Monocytes Relative: 15 %
Neutro Abs: 5.4 10*3/uL (ref 1.7–7.7)
Neutrophils Relative %: 72 %
Platelets: 463 10*3/uL — ABNORMAL HIGH (ref 150–400)
RBC: 3.03 MIL/uL — ABNORMAL LOW (ref 4.22–5.81)
RDW: 16.8 % — ABNORMAL HIGH (ref 11.5–15.5)
WBC: 7.3 10*3/uL (ref 4.0–10.5)
nRBC: 0 % (ref 0.0–0.2)

## 2019-06-21 LAB — PHOSPHORUS: Phosphorus: 3.4 mg/dL (ref 2.5–4.6)

## 2019-06-21 LAB — MAGNESIUM: Magnesium: 2 mg/dL (ref 1.7–2.4)

## 2019-06-21 MED ORDER — CYANOCOBALAMIN 1000 MCG/ML IJ SOLN
1000.0000 ug | Freq: Once | INTRAMUSCULAR | Status: AC
  Administered 2019-06-21: 1000 ug via INTRAMUSCULAR
  Filled 2019-06-21: qty 1

## 2019-06-21 NOTE — Progress Notes (Addendum)
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  Chemotherapy has been stopped.  Patient completed radiation treatment on 5/13.  PEG placed on 4/8.    Met with patient and girlfriend [name] in clinic this am.  Chong Sicilian reports that patient was able to take in mostly 3 cartons of osmolite 1.5, were 2 days [name] was able to get in 4 cartons.  Providing 60 ml water flush before and after each feeding.  Giving enemas for constipation, nothing else is working for him.   Nausea has improved with zyprexa.  [name] reports that this is the "miracle pill".  Patient ate little bit of potato salad yesterday, good humor bar, cereal, drinking pedialyte.  Reports no taste and burning in mouth with eating.     Medications: reviewed  Labs: reviewed  Anthropometrics:   Weight 96 lb 11.2 oz today increased from 95 lb on 5/18  97 lb on 5/13 107 lb March 2021   Estimated Energy Needs  Kcals: 1500-1750 Protein: 75-88 g Fluid: > 1.5 L  NUTRITION DIAGNOSIS: Inadequate oral intake continues   INTERVENTION:  Patient not interested in continuous pump feeding.  Discussed trying 1 full carton of osmolite 1.5 plus 1 syringe (44m) TID to increase calories and protein.  Patient will try.  Current tube feeding providing 1065 calories, 45 g protein and 900 ml free water (flush and formula).  Meeting 71% of calorie needs and 60% of protein needs. Continue enemas as other medications have not worked for patient.  Discussed soft foods to consider to try and maximize oral intake.   Patient has contact information    MONITORING, EVALUATION, GOAL: weight trends, intake, TF tolerance   NEXT VISIT: Thursday, June 24 phone f/u  Shiron Whetsel B. AZenia Resides RBrielle LWittRegistered Dietitian [phone](pager)

## 2019-06-21 NOTE — Patient Instructions (Signed)
American ginseng or [place] ginseng- 1 tablet  Twice a day Retry your movantik for constipation Keep citalopram at same dose

## 2019-06-22 NOTE — Progress Notes (Signed)
Hematology/Oncology Consult note [facility]  Telephone:([phone] [phone]  Patient Care Team: [name], NP as PCP - General (Nurse Practitioner) Greg Cutter, LCSW as Social Worker (Licensed Clinical Social Worker) De Hollingshead, [facility] as Pharmacist (Pharmacist) Vanita Ingles, RN as Case Manager (General Practice)   Name of the patient: Kevin Nelson  [id number]  [birth year]   Date of visit: 06/22/19  Diagnosis- Squamous cell carcinoma of the supraglottic larynx stage IVAcT4 acN1 cM0  Chief complaint/ Reason for visit-routine follow-up of head and neck cancer  Heme/Onc history: patient is a 69 year old male with a past medical history significant for tobacco dependence, COPD on home oxygen who presented to Dr. Richardson Landry with symptoms of difficulty swallowing and persistent sore throat which has been ongoing for the last 1 year.NPL exam showed afriable mass involving the false vocal cord but did not involve the true vocal cords. True vocal cords remain mobile on exam. Hypopharynx and tongue base was clear.   This was followed by CT soft tissue neck which showed a 3.3 x 1.7 x 3.5 cm mass which was thought to be involving both the true and false vocal cords and extending anteriorly to involve the anterior commissure. Mass extends to involve the paraglottic space and erodes through the thyroid cartilage anteriorly and left of midline. There is also invasion of the preepiglottic space inferiorly. Enlarged cystic necrotic level 3/4 lymph node measuring 1.6 x 1.2 cm. Findings are consistent with nodal metastatic disease. Biopsy of the mass showed squamous cell carcinoma.  PET/CT showed hyppermetabolic mass extending from left glottis to anterior commisure. Solitary left level 2 metastatic Lymph node. No distant metastatic disease  Patient met with [facility] and decided against total laryngectomy. Plan is for concurrent chemo/RT  +/-adjuvantchemotherapy following chemo/RT.patient seen 5 cycles of weekly cisplatin chemotherapy with the last dose which was given on 04/30/2019. Chemotherapy has been on hold due to poor nutrition and ongoing fatigue   Interval history-reports nausea is much better controlled with Zyprexa.  He continues to struggle with constipation.  He could not try Movantik in the past because of nausea secondary to it.  He uses enema on an as-needed basis to relieve his constipation.  Has significant ongoing fatigue.  He is able to keep up with 3 tube feeds in a day instead of a target of 4.  Continues to have pain during swallowing which is relatively stable with oxycodone.  ECOG PS- 2 Pain scale- 2 Opioid associated constipation- yes  Review of systems- Review of Systems  Constitutional: Positive for malaise/fatigue and weight loss. Negative for chills and fever.  HENT: Negative for congestion, ear discharge and nosebleeds.   Eyes: Negative for blurred vision.  Respiratory: Negative for cough, hemoptysis, sputum production, shortness of breath and wheezing.   Cardiovascular: Negative for chest pain, palpitations, orthopnea and claudication.  Gastrointestinal: Positive for constipation. Negative for abdominal pain, blood in stool, diarrhea, heartburn, melena, nausea and vomiting.  Genitourinary: Negative for dysuria, flank pain, frequency, hematuria and urgency.  Musculoskeletal: Negative for back pain, joint pain and myalgias.  Skin: Negative for rash.  Neurological: Negative for dizziness, tingling, focal weakness, seizures, weakness and headaches.  Endo/Heme/Allergies: Does not bruise/bleed easily.  Psychiatric/Behavioral: Negative for depression and suicidal ideas. The patient does not have insomnia.       No Known Allergies   Past Medical History:  Diagnosis Date  . Anxiety   . Basal cell carcinoma   . Cigarette smoker   .  Dental decay   . Dysphagia   . Dyspnea   . End stage COPD  ([place])    end stage copd, emphysema  . Erectile dysfunction   . Headache    every morning  . Hepatitis C    treated for 8 weeks this past year with Mayvret  . History of nonmelanoma skin cancer   . Hoarseness, chronic   . Myocardial infarction [facility]) march/april 2020   mild heart attack  . Prostate cancer ([place])   . Respiratory failure, acute ([place])    was inpt for 5 days. thought he was going to die.  . Sleep apnea   . Squamous cell skin cancer   . Throat cancer ([place])   . Use of leuprolide acetate (Lupron)    treated for prostate cancer.   . 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     Past Surgical History:  Procedure Laterality Date  . FACIAL LACERATION REPAIR    . HERNIA REPAIR Left 1992   inguinal  . IR GASTROSTOMY TUBE MOD SED  04/26/2019  . MICROLARYNGOSCOPY N/A 01/17/2019   Procedure: MICROLARYNGOSCOPY WITH BX;  Surgeon: Clyde Canterbury, MD;  Location: [facility] ORS;  Service: ENT;  Laterality: N/A;  . PORTA CATH INSERTION N/A 03/15/2019   Procedure: PORTA CATH INSERTION;  Surgeon: Algernon Huxley, MD;  Location: [facility] CV LAB;  Service: Cardiovascular;  Laterality: N/A;  . PROSTATE BIOPSY    . SKIN CANCER EXCISION    . TRIGGER FINGER RELEASE Left 10/2018    Social History   Socioeconomic History  . Marital status: Significant Other    Spouse name: [name], girlfriend  . Number of children: Not on file  . Years of education: Not on file  . Highest education level: Not on file  Occupational History  . Occupation: Chief Strategy Officer    Comment: retired  Tobacco Use  . Smoking status: Current Every Day Smoker    Packs/day: 0.25    Years: 52.00    Pack years: 13.00    Types: Cigarettes  . Smokeless tobacco: Never Used  . Tobacco comment: 5 cigarettes a day  Substance and Sexual Activity  . Alcohol use: Not Currently    Alcohol/week: 0.0 standard drinks  . Drug use: Never  . Sexual activity: Yes  Other Topics Concern  . Not on file  Social History Narrative    Patient has retired d/t respiratory status. Currently lives with girlfriend    Social Determinants of Health   Financial Resource Strain: Low Risk   . Difficulty of Paying Living Expenses: Not hard at all  Food Insecurity: No Food Insecurity  . Worried About Charity fundraiser in the Last Year: Never true  . Ran Out of Food in the Last Year: Never true  Transportation Needs: No Transportation Needs  . Lack of Transportation (Medical): No  . Lack of Transportation (Non-Medical): No  Physical Activity: Inactive  . Days of Exercise per Week: 0 days  . Minutes of Exercise per Session: 0 min  Stress: Stress Concern Present  . Feeling of Stress : Very much  Social Connections:   . Frequency of Communication with Friends and Family:   . Frequency of Social Gatherings with Friends and Family:   . Attends Religious Services:   . Active Member of Clubs or Organizations:   . Attends Archivist Meetings:   [name] Marital Status:   Intimate Partner Violence:   . Fear of Current or Ex-Partner:   .  Emotionally Abused:   [name] Physically Abused:   . Sexually Abused:     Family History  Problem Relation Age of Onset  . Cancer Sister   . Cancer - Cervical Mother   . Heart attack Brother   . Heart attack Maternal Grandfather      Current Outpatient Medications:  .  citalopram (CELEXA) 20 MG tablet, Take 1 tablet (20 mg total) by mouth daily., Disp: 30 tablet, Rfl: 0 .  clonazePAM (KLONOPIN) 1 MG tablet, Take 1 tablet (1 mg total) by mouth 2 (two) times daily as needed for anxiety., Disp: 60 tablet, Rfl: 2 .  COMBIVENT RESPIMAT 20-100 MCG/ACT AERS respimat, INHALE 1 PUFF BY MOUTH INTO THE LUNGS EVERY 6 HOURS AS NEEDED, Disp: 4 g, Rfl: 0 .  fluticasone (FLONASE) 50 MCG/ACT nasal spray, SHAKE LIQUID AND USE 1 SPRAY IN EACH NOSTRIL TWICE DAILY, Disp: 16 g, Rfl: 1 .  ipratropium-albuterol (DUONEB) 0.5-2.5 (3) MG/3ML SOLN, Take 3 mLs by nebulization every 6 (six) hours as needed., Disp: 360  mL, Rfl: 11 .  naloxegol oxalate (MOVANTIK) 25 MG TABS tablet, Take 1 tablet (25 mg total) by mouth daily., Disp: 30 tablet, Rfl: 0 .  Nutritional Supplements (FEEDING SUPPLEMENT, OSMOLITE 1.5 CAL,) LIQD, Give 5 cartons per day via feeding tube (1 1/2 cartons at 8am and noon and 1 carton at 4pm and 8pm).  Flush with 34m of water before and after each feeding.  Provides 1775 calories, 74.5 g protein and 1806mfree water and meets 100% of nutritional needs.  Send bolus supplies., Disp: 1185 mL, Rfl: 3 .  OLANZapine (ZYPREXA) 10 MG tablet, Take 1 tablet (10 mg total) by mouth at bedtime., Disp: 30 tablet, Rfl: 2 .  oxyCODONE (OXYCONTIN) 10 mg 12 hr tablet, Take 1 tablet (10 mg total) by mouth every 12 (twelve) hours., Disp: 60 tablet, Rfl: 0 .  Oxycodone HCl 10 MG TABS, Take 1 tablet (10 mg total) by mouth every 4 (four) hours as needed., Disp: 90 tablet, Rfl: 0 .  OXYGEN, Inhale 3 L into the lungs daily., Disp: , Rfl:  .  pantoprazole (PROTONIX) 20 MG tablet, Take 1 tablet (20 mg total) by mouth daily., Disp: 30 tablet, Rfl: 2 .  polyethylene glycol powder (CLEARLAX) 17 GM/SCOOP powder, Take 1 Container by mouth once., Disp: , Rfl:  .  potassium & sodium phosphates (PHOS-NAK) 280-160-250 MG PACK, Take 1 packet by mouth 2 (two) times daily. For hypophosphatemia. Give via PEG., Disp: 120 each, Rfl: 0 .  prochlorperazine (COMPAZINE) 10 MG tablet, TAKE 1 TABLET(10 MG) BY MOUTH EVERY 6 HOURS AS NEEDED FOR NAUSEA OR VOMITING, Disp: 30 tablet, Rfl: 1 .  STIOLTO RESPIMAT 2.5-2.5 MCG/ACT AERS, Inhale 2 puffs into the lungs 2 (two) times daily as needed (shortness of breath). , Disp: , Rfl:   Physical exam:  Vitals:   06/21/19 0920  BP: 111/64  Pulse: 80  Resp: 18  Temp: (!) 95.3 F (35.2 C)  TempSrc: Tympanic  SpO2: 95%  Weight: 96 lb 11.2 oz (43.9 kg)   Physical Exam   CMP Latest Ref Rng & Units 06/21/2019  Glucose 70 - 99 mg/dL 130(H)  BUN 8 - 23 mg/dL 18  Creatinine 0.61 - 1.24 mg/dL 0.54(L)    Sodium 135 - 145 mmol/L 139  Potassium 3.5 - 5.1 mmol/L 4.2  Chloride 98 - 111 mmol/L 97(L)  CO2 22 - 32 mmol/L 32  Calcium 8.9 - 10.3 mg/dL 8.4(L)  Total Protein 6.5 - 8.1  g/dL 6.2(L)  Total Bilirubin 0.3 - 1.2 mg/dL 0.6  Alkaline Phos 38 - 126 U/L 47  AST 15 - 41 U/L 17  ALT 0 - 44 U/L 16   CBC Latest Ref Rng & Units 06/21/2019  WBC 4.0 - 10.5 K/uL 7.3  Hemoglobin 13.0 - 17.0 g/dL 8.8(L)  Hematocrit 39.0 - 52.0 % 27.7(L)  Platelets 150 - 400 K/uL 463(H)     Assessment and plan- Patient is a 69 y.o. male  With Squamous cell carcinoma of the supraglottic larynx stage IVAcT4 acN1 cM0status post 5 weekly cycles of cisplatin.  He is here for routine follow-up of head and neck cancer  1.  Nausea: Currently well controlled with Zyprexa.  He will continue the same  2.  Fatigue: Multifactorial secondary to head and neck cancer as well as baseline severe protein calorie malnutrition and cachexia.  He was noted to have a borderline low B12 level of 263.  He will get his B12 shot today and we will plan to give it monthly.  I have also recommended that he can try American unresponsive ginseng 1 g twice daily.  He has been given a trial of steroids in the past but patient is not sure if it helped him or not and that is another thing that we can try down the line  3.  Depression: Currently patient is on citalopram for the last 3 weeks.  I would like to keep him at the present dose and give him another 3 weeks to see how his symptoms are if we can increase the dose further  4.  Constipation: As his nausea is under better control I have recommended that he should retry Movantik to see if it would help him with opioid-induced constipation in addition to ongoing enema.  5.  Normocytic anemia: Suspect secondary to malignancy and anemia of chronic disease.  He has not had any chemotherapy for over 5 weeks now and has not had any significant improvement in his anemia.  Hopefully the B12 shots will  help  6.  Patient has a restaging PET scan scheduled on 07/19/2019 and I will see him after that.He will also see palliative care nurse practitioner Milus Glazier on the same day as well  7.  Neoplasm related pain: Continue OxyContin and oxycodone   Visit Diagnosis 1. Squamous cell carcinoma of larynx (HCC)   2. Therapeutic opioid induced constipation   3. Severe protein-calorie malnutrition ([place])   4. Neoplasm related pain   5. Normocytic anemia   6. Depression, unspecified depression type      Dr. Randa Evens, MD, MPH [facility] at [facility] [phone] 06/22/2019 12:43 PM

## 2019-06-25 ENCOUNTER — Other Ambulatory Visit: Payer: Self-pay | Admitting: *Deleted

## 2019-06-25 DIAGNOSIS — C329 Malignant neoplasm of larynx, unspecified: Secondary | ICD-10-CM

## 2019-06-25 MED ORDER — CITALOPRAM HYDROBROMIDE 20 MG PO TABS: 20.0000 mg | ORAL_TABLET | Freq: Every day | ORAL | 0 refills | Status: DC

## 2019-06-25 MED ORDER — OXYCODONE HCL 10 MG PO TABS: 10.0000 mg | ORAL_TABLET | ORAL | 0 refills | Status: DC | PRN

## 2019-06-28 ENCOUNTER — Other Ambulatory Visit: Payer: Self-pay | Admitting: Oncology

## 2019-06-28 ENCOUNTER — Other Ambulatory Visit: Payer: Self-pay | Admitting: Pulmonary Disease

## 2019-06-28 ENCOUNTER — Other Ambulatory Visit: Payer: Self-pay | Admitting: Nurse Practitioner

## 2019-06-28 NOTE — Telephone Encounter (Signed)
  Notes to clinic Historical provider   

## 2019-06-28 NOTE — Telephone Encounter (Signed)
Routing to provider  

## 2019-06-29 ENCOUNTER — Other Ambulatory Visit: Payer: Self-pay | Admitting: *Deleted

## 2019-06-29 ENCOUNTER — Other Ambulatory Visit: Payer: Self-pay

## 2019-06-29 ENCOUNTER — Telehealth: Payer: Self-pay | Admitting: *Deleted

## 2019-06-29 ENCOUNTER — Other Ambulatory Visit: Payer: Self-pay | Admitting: Pulmonary Disease

## 2019-06-29 ENCOUNTER — Inpatient Hospital Stay: Payer: Medicare Other

## 2019-06-29 DIAGNOSIS — C321 Malignant neoplasm of supraglottis: Secondary | ICD-10-CM | POA: Diagnosis not present

## 2019-06-29 DIAGNOSIS — R350 Frequency of micturition: Secondary | ICD-10-CM

## 2019-06-29 DIAGNOSIS — R300 Dysuria: Secondary | ICD-10-CM

## 2019-06-29 LAB — URINALYSIS, COMPLETE (UACMP) WITH MICROSCOPIC
Bacteria, UA: NONE SEEN
Bilirubin Urine: NEGATIVE
Glucose, UA: NEGATIVE mg/dL
Hgb urine dipstick: NEGATIVE
Ketones, ur: NEGATIVE mg/dL
Leukocytes,Ua: NEGATIVE
Nitrite: NEGATIVE
Protein, ur: NEGATIVE mg/dL
Specific Gravity, Urine: 1.006 (ref 1.005–1.030)
Squamous Epithelial / HPF: NONE SEEN (ref 0–5)
pH: 7 (ref 5.0–8.0)

## 2019-06-29 MED ORDER — PHENAZOPYRIDINE HCL 100 MG PO TABS
100.0000 mg | ORAL_TABLET | Freq: Three times a day (TID) | ORAL | 0 refills | Status: DC | PRN
Start: 2019-06-29 — End: 2019-09-18

## 2019-06-29 MED ORDER — OXYCODONE HCL ER 10 MG PO T12A: 10.0000 mg | EXTENDED_RELEASE_TABLET | Freq: Two times a day (BID) | ORAL | 0 refills | Status: DC

## 2019-06-29 NOTE — Progress Notes (Signed)
Called and spoke to [name] to let her know that the UA the patient had done was totally normal and the culture has not grown out any kind of bacteria at this time.  So on this patient does not need an antibiotic but due to the pain that he has from urination Dr. Judithann Graves is willing to give him some Pyridium.  I did explain that when you take Pyridium your urine will be orange and also if you have any dribbling or any kind of leakage on your clothing it will turn it orange also.  [name] went and got the patient and got him on the telephone and I explained this entire thing like I did to [name] to the patient and at this time he would like me to send the prescription in and if it gets too bad he will take it but for right now he will hold off but he wants it because were going into the weekend just in case the pain gets worse.  Prescription sent to his pharmacy

## 2019-06-29 NOTE — Telephone Encounter (Signed)
I spoke with [name] and she will bring patient over to give urine sample now. Orders entered and lab informed of add on to schedule

## 2019-06-29 NOTE — Telephone Encounter (Signed)
[name] called reporting that patient is having urge to urinate frequently, but when he goes, he can only pass a small amount of urine and he is having some burning with urination as well. She is concerned that he may have a UTI and is asking if we can check him or if he needs to contact his PCP. Please advise

## 2019-06-29 NOTE — Telephone Encounter (Signed)
He can drop off urine sample with [place]. Will need to check UA and urine culture

## 2019-06-30 LAB — URINE CULTURE: Culture: NO GROWTH

## 2019-07-11 ENCOUNTER — Ambulatory Visit
Admission: RE | Admit: 2019-07-11 | Discharge: 2019-07-11 | Disposition: A | Discharge status: Home / Self Care | Payer: Medicare Other | Source: Ambulatory Visit | Attending: Radiation Oncology | Admitting: Radiation Oncology

## 2019-07-11 ENCOUNTER — Encounter: Payer: Self-pay | Admitting: Radiation Oncology

## 2019-07-11 ENCOUNTER — Other Ambulatory Visit: Payer: Self-pay

## 2019-07-11 ENCOUNTER — Telehealth: Payer: Self-pay

## 2019-07-11 VITALS — BP 118/68 | HR 82 | Temp 98.1°F | Resp 20 | Wt 100.1 lb

## 2019-07-11 DIAGNOSIS — Z923 Personal history of irradiation: Secondary | ICD-10-CM | POA: Insufficient documentation

## 2019-07-11 DIAGNOSIS — C329 Malignant neoplasm of larynx, unspecified: Secondary | ICD-10-CM | POA: Diagnosis present

## 2019-07-11 DIAGNOSIS — Z9221 Personal history of antineoplastic chemotherapy: Secondary | ICD-10-CM | POA: Insufficient documentation

## 2019-07-11 DIAGNOSIS — K5900 Constipation, unspecified: Secondary | ICD-10-CM | POA: Diagnosis not present

## 2019-07-11 NOTE — Progress Notes (Signed)
Radiation Oncology Follow up Note  Name: Kevin Nelson   Date:   07/11/2019 MRN:  [id number] DOB: 1951-01-06    This 68 y.o. male presents to the clinic today for 1 month follow-up status post concurrent chemoradiation therapy for stage IV (T4 aN1 M0) squamous cell carcinoma of the supraglottic larynx.  REFERRING PROVIDER: [name], NP  HPI: Patient is a 69 year old male now out 1 month having completed concurrent chemoradiation therapy for stage IV squamous cell carcinoma of the supraglottic larynx.  He is seen today in follow-up and is improving.  He is having no head and neck pain and no dysphagia.  He is constipated secondary to narcotic analgesics unrelated to his head and neck cancer.  He still has his feeding tube present which is working well although he is starting to swallow well and eating normal meals..  COMPLICATIONS OF TREATMENT: none  FOLLOW UP COMPLIANCE: keeps appointments   PHYSICAL EXAM:  BP 118/68 (BP Location: Left Arm, Patient Position: Sitting, Cuff Size: Small)   Pulse 82   Temp 98.1 F (36.7 C) (Tympanic)   Resp 20   Wt 100 lb 1.6 oz (45.4 kg)   BMI 16.16 kg/m  No evidence of adenopathy in the head and neck region or supraclavicular fossa is noted.  Feeding tube is present.  Well-developed well-nourished patient in NAD. HEENT reveals PERLA, EOMI, discs not visualized.  Oral cavity is clear. No oral mucosal lesions are identified. Neck is clear without evidence of cervical or supraclavicular adenopathy. Lungs are clear to A&P. Cardiac examination is essentially unremarkable with regular rate and rhythm without murmur rub or thrill. Abdomen is benign with no organomegaly or masses noted. Motor sensory and DTR levels are equal and symmetric in the upper and lower extremities. Cranial nerves II through XII are grossly intact. Proprioception is intact. No peripheral adenopathy or edema is identified. No motor or sensory levels are noted. Crude visual fields are  within normal range.  RADIOLOGY RESULTS: PET scan has been ordered for early July  PLAN: Present time patient is doing well of asked him to copy me on his PET scan in early July.  Otherwise we are making appointment for him with follow-up with ENT for upper endoscopy.  I have asked to see him back in 3 to 4 months for follow-up.  Patient knows to call with any concerns at any time.  I would like to take this opportunity to thank you for allowing me to participate in the care of your patient.Noreene Filbert, MD

## 2019-07-12 ENCOUNTER — Inpatient Hospital Stay: Payer: Medicare Other

## 2019-07-12 NOTE — Progress Notes (Signed)
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  Chemotherapy was stopped and patient completed radiation on 5/13.  PEG placed on 4/8.  Spoke with Kevin Nelson, pt's girlfriend by phone this pm.  Kevin Nelson reports that patient continues to give 3 cartons of osmolite 1.5 via feeding tube.  Has problems with constipation but relieved with enemas.  Kevin Nelson states that patient is eating more orally (grilled cheese, spaghetti, tuna salad, potato salad, cheesecake, cooked vegetables, some meat but has trouble chewing it.  Reports that patient is smoking less, less nausea and sleeping less which have allowed him to get in more nutrition.      Medications: reviewed  Labs: reviewed  Anthropometrics:   Weight 100 lb 1.6 oz on 6/23 increased from 96 lb   Estimated Energy Needs  Kcals: 1500-1750 Protein: 75-88 g Fluid: > 1.5 L  NUTRITION DIAGNOSIS: Inadequate oral intake continues   INTERVENTION:  Discussed with Patti, plan to titrate down tube feeding as patient eating more orally.  Kevin Nelson will check weight at least weekly before removing a carton of tube feeding.  Verbalized concern that patient weight is still low and it is imperative that patient not loose weight during this transition to more oral intake.  She verbalized understanding.   Discussed high calorie high protein soft moist foods to continued to eat and try. Kevin Nelson has contact information    MONITORING, EVALUATION, GOAL: weight trends, intake, Tube feeding tolerance   NEXT VISIT: July 12 phone f/u (Monday)  Curry Dulski B. Zenia Resides, [place], [place] Registered Dietitian [phone] (pager)

## 2019-07-18 ENCOUNTER — Other Ambulatory Visit: Payer: Self-pay | Admitting: *Deleted

## 2019-07-18 MED ORDER — NALOXEGOL OXALATE 25 MG PO TABS: 25.0000 mg | ORAL_TABLET | Freq: Every day | ORAL | 0 refills | Status: DC

## 2019-07-18 NOTE — Telephone Encounter (Signed)
[name] called requesting a refill of patient Movantik but also asking if the dose could be increased due to him still having problems with constipation. Please advise

## 2019-07-19 ENCOUNTER — Ambulatory Visit
Admission: RE | Admit: 2019-07-19 | Discharge: 2019-07-19 | Disposition: A | Discharge status: Home / Self Care | Payer: Medicare Other | Source: Ambulatory Visit | Attending: Oncology | Admitting: Oncology

## 2019-07-19 ENCOUNTER — Other Ambulatory Visit: Payer: Self-pay

## 2019-07-19 DIAGNOSIS — J9811 Atelectasis: Secondary | ICD-10-CM | POA: Diagnosis not present

## 2019-07-19 DIAGNOSIS — R933 Abnormal findings on diagnostic imaging of other parts of digestive tract: Secondary | ICD-10-CM | POA: Diagnosis not present

## 2019-07-19 DIAGNOSIS — C329 Malignant neoplasm of larynx, unspecified: Secondary | ICD-10-CM | POA: Insufficient documentation

## 2019-07-19 LAB — GLUCOSE, CAPILLARY: Glucose-Capillary: 92 mg/dL (ref 70–99)

## 2019-07-19 MED ORDER — FLUDEOXYGLUCOSE F - 18 (FDG) INJECTION
5.6200 | Freq: Once | INTRAVENOUS | Status: AC | PRN
  Administered 2019-07-19: 5.62 via INTRAVENOUS

## 2019-07-20 ENCOUNTER — Other Ambulatory Visit: Payer: Self-pay

## 2019-07-20 ENCOUNTER — Encounter: Payer: Self-pay | Admitting: Oncology

## 2019-07-20 ENCOUNTER — Inpatient Hospital Stay: Payer: Medicare Other | Attending: Oncology | Admitting: Oncology

## 2019-07-20 ENCOUNTER — Telehealth: Payer: Self-pay

## 2019-07-20 ENCOUNTER — Inpatient Hospital Stay: Payer: Medicare Other

## 2019-07-20 ENCOUNTER — Inpatient Hospital Stay: Payer: Medicare Other | Admitting: Hospice and Palliative Medicine

## 2019-07-20 VITALS — BP 100/51 | HR 93 | Temp 98.2°F | Resp 16 | Ht 66.0 in | Wt 101.0 lb

## 2019-07-20 DIAGNOSIS — C329 Malignant neoplasm of larynx, unspecified: Secondary | ICD-10-CM | POA: Diagnosis not present

## 2019-07-20 DIAGNOSIS — T402X5A Adverse effect of other opioids, initial encounter: Secondary | ICD-10-CM

## 2019-07-20 DIAGNOSIS — E538 Deficiency of other specified B group vitamins: Secondary | ICD-10-CM | POA: Diagnosis not present

## 2019-07-20 DIAGNOSIS — E43 Unspecified severe protein-calorie malnutrition: Secondary | ICD-10-CM

## 2019-07-20 DIAGNOSIS — K5903 Drug induced constipation: Secondary | ICD-10-CM | POA: Diagnosis not present

## 2019-07-20 MED ORDER — CYANOCOBALAMIN 1000 MCG/ML IJ SOLN
1000.0000 ug | Freq: Once | INTRAMUSCULAR | Status: AC
  Administered 2019-07-20: 1000 ug via INTRAMUSCULAR
  Filled 2019-07-20: qty 1

## 2019-07-20 NOTE — Progress Notes (Signed)
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 Richardson Landry with symptoms of difficulty swallowing and persistent sore throat which has been ongoing for the last 1 year.NPL exam showed afriable mass involving the false vocal cord but did not involve the true vocal cords. True vocal cords remain mobile on exam. Hypopharynx and tongue base was clear.   This was followed by CT soft tissue neck which showed a 3.3 x 1.7 x 3.5 cm mass which was thought to be involving both the true and false vocal cords and extending anteriorly to involve the anterior commissure. Mass extends to involve the paraglottic space and erodes through the thyroid cartilage anteriorly and left of midline. There is also invasion of the preepiglottic space inferiorly. Enlarged cystic necrotic level 3/4 lymph node measuring 1.6 x 1.2 cm. Findings are consistent with nodal metastatic disease. Biopsy of the mass showed squamous cell carcinoma.  PET/CT showed hyppermetabolic mass extending from left glottis to anterior commisure. Solitary left level 2 metastatic Lymph node. No distant metastatic disease  Patient met with [facility] ENT and decided against total laryngectomy. Plan is for concurrent  chemo/RT +/-adjuvantchemotherapy following chemo/RT.patient seen 5 cycles of weekly cisplatin chemotherapy with the last dose which was given on 04/30/2019. Chemotherapy has been on hold due to poor nutrition and ongoing fatigue  Interval history-patient reports feeling better overall after he started receiving B12 shots.  His weight is up 6 pounds.  He is also able to eat some solid food.  He is still taking about 3 tube feeds per day.  Still struggling with constipation and Movantik does not help entirely.  Pain is currently well controlled.  Zyprexa is working well for his nausea.  ECOG PS- 2 Pain scale- 0 Opioid associated constipation- no  Review of systems- Review of Systems  Constitutional: Positive for malaise/fatigue. Negative for chills, fever and weight loss.  HENT: Negative for congestion, ear discharge and nosebleeds.   Eyes: Negative for blurred vision.  Respiratory: Negative for cough, hemoptysis, sputum production, shortness of breath and wheezing.   Cardiovascular: Negative for chest pain, palpitations, orthopnea and claudication.  Gastrointestinal: Positive for constipation and nausea. Negative for abdominal pain, blood in stool, diarrhea, heartburn, melena and vomiting.  Genitourinary: Negative for dysuria, flank pain, frequency, hematuria and urgency.  Musculoskeletal: Negative for back pain, joint pain and myalgias.  Skin: Negative for rash.  Neurological: Negative for dizziness, tingling, focal weakness, seizures, weakness and headaches.  Endo/Heme/Allergies: Does not bruise/bleed easily.  Psychiatric/Behavioral: Negative for depression and suicidal ideas. The patient does not have insomnia.        No Known Allergies   Past Medical History:  Diagnosis Date  . Anxiety   . Basal cell carcinoma   . Cigarette smoker   . Dental decay   . Dysphagia   . Dyspnea   .  End stage COPD ([place])    end stage copd, emphysema  . Erectile dysfunction   . Headache     every morning  . Hepatitis C    treated for 8 weeks this past year with Mayvret  . History of nonmelanoma skin cancer   . Hoarseness, chronic   . Myocardial infarction [facility]) march/april 2020   mild heart attack  . Prostate cancer ([place])   . Respiratory failure, acute ([place])    was inpt for 5 days. thought he was going to die.  . Sleep apnea   . Squamous cell skin cancer   . Throat cancer ([place])   . Use of leuprolide acetate (Lupron)    treated for prostate cancer.   . 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     Past Surgical History:  Procedure Laterality Date  . FACIAL LACERATION REPAIR    . HERNIA REPAIR Left 1992   inguinal  . IR GASTROSTOMY TUBE MOD SED  04/26/2019  . MICROLARYNGOSCOPY N/A 01/17/2019   Procedure: MICROLARYNGOSCOPY WITH BX;  Surgeon: Clyde Canterbury, MD;  Location: [facility] ORS;  Service: ENT;  Laterality: N/A;  . PORTA CATH INSERTION N/A 03/15/2019   Procedure: PORTA CATH INSERTION;  Surgeon: Algernon Huxley, MD;  Location: [place] CV LAB;  Service: Cardiovascular;  Laterality: N/A;  . PROSTATE BIOPSY    . SKIN CANCER EXCISION    . TRIGGER FINGER RELEASE Left 10/2018    Social History   Socioeconomic History  . Marital status: Significant Other    Spouse name: [name], girlfriend  . Number of children: Not on file  . Years of education: Not on file  . Highest education level: Not on file  Occupational History  . Occupation: Chief Strategy Officer    Comment: retired  Tobacco Use  . Smoking status: Current Every Day Smoker    Packs/day: 0.25    Years: 52.00    Pack years: 13.00    Types: Cigarettes  . Smokeless tobacco: Never Used  . Tobacco comment: 5 cigarettes a day  Vaping Use  . Vaping Use: Never used  Substance and Sexual Activity  . Alcohol use: Not Currently    Alcohol/week: 0.0 standard drinks  . Drug use: Never  . Sexual activity: Yes  Other Topics Concern  . Not on file  Social History Narrative   Patient has retired d/t respiratory  status. Currently lives with girlfriend    Social Determinants of Health   Financial Resource Strain: Low Risk   . Difficulty of Paying Living Expenses: Not hard at all  Food Insecurity: No Food Insecurity  . Worried About Charity fundraiser in the Last Year: Never true  . Ran Out of Food in the Last Year: Never true  Transportation Needs: No Transportation Needs  . Lack of Transportation (Medical): No  . Lack of Transportation (Non-Medical): No  Physical Activity: Inactive  . Days of Exercise per Week: 0 days  . Minutes of Exercise per Session: 0 min  Stress: Stress Concern Present  . Feeling of Stress : Very much  Social Connections:   . Frequency of Communication with Friends and Family:   . Frequency of Social Gatherings with Friends and Family:   . Attends Religious Services:   . Active Member of Clubs or Organizations:   . Attends Archivist Meetings:   [name] Marital Status:   Intimate Partner Violence:   . Fear of Current or Ex-Partner:   . Emotionally  Abused:   [name] Physically Abused:   . Sexually Abused:     Family History  Problem Relation Age of Onset  . Cancer Sister   . Cancer - Cervical Mother   . Heart attack Brother   . Heart attack Maternal Grandfather      Current Outpatient Medications:  .  citalopram (CELEXA) 20 MG tablet, TAKE 1 TABLET(20 MG) BY MOUTH DAILY, Disp: 30 tablet, Rfl: 0 .  clonazePAM (KLONOPIN) 1 MG tablet, Take 1 tablet (1 mg total) by mouth 2 (two) times daily as needed for anxiety., Disp: 60 tablet, Rfl: 2 .  COMBIVENT RESPIMAT 20-100 MCG/ACT AERS respimat, INHALE 1 PUFF BY MOUTH INTO THE LUNGS EVERY 6 HOURS AS NEEDED, Disp: 4 g, Rfl: 0 .  fluticasone (FLONASE) 50 MCG/ACT nasal spray, SHAKE LIQUID AND USE 1 SPRAY IN EACH NOSTRIL TWICE DAILY, Disp: 16 g, Rfl: 1 .  ipratropium-albuterol (DUONEB) 0.5-2.5 (3) MG/3ML SOLN, Take 3 mLs by nebulization every 6 (six) hours as needed., Disp: 360 mL, Rfl: 11 .  naloxegol oxalate (MOVANTIK) 25  MG TABS tablet, Take 1 tablet (25 mg total) by mouth daily., Disp: 30 tablet, Rfl: 0 .  Nutritional Supplements (FEEDING SUPPLEMENT, OSMOLITE 1.5 CAL,) LIQD, Give 5 cartons per day via feeding tube (1 1/2 cartons at 8am and noon and 1 carton at 4pm and 8pm).  Flush with 68m of water before and after each feeding.  Provides 1775 calories, 74.5 g protein and 18052mfree water and meets 100% of nutritional needs.  Send bolus supplies., Disp: 1185 mL, Rfl: 3 .  OLANZapine (ZYPREXA) 10 MG tablet, Take 1 tablet (10 mg total) by mouth at bedtime., Disp: 30 tablet, Rfl: 2 .  oxyCODONE (OXYCONTIN) 10 mg 12 hr tablet, Take 1 tablet (10 mg total) by mouth every 12 (twelve) hours., Disp: 60 tablet, Rfl: 0 .  Oxycodone HCl 10 MG TABS, Take 1 tablet (10 mg total) by mouth every 4 (four) hours as needed., Disp: 90 tablet, Rfl: 0 .  OXYGEN, Inhale 3 L into the lungs daily., Disp: , Rfl:  .  pantoprazole (PROTONIX) 20 MG tablet, TAKE 1 TABLET(20 MG) BY MOUTH DAILY, Disp: 30 tablet, Rfl: 2 .  phenazopyridine (PYRIDIUM) 100 MG tablet, Take 1 tablet (100 mg total) by mouth 3 (three) times daily as needed for pain., Disp: 10 tablet, Rfl: 0 .  polyethylene glycol powder (CLEARLAX) 17 GM/SCOOP powder, Take 1 Container by mouth once., Disp: , Rfl:  .  potassium & sodium phosphates (PHOS-NAK) 280-160-250 MG PACK, Take 1 packet by mouth 2 (two) times daily. For hypophosphatemia. Give via PEG., Disp: 120 each, Rfl: 0 .  prochlorperazine (COMPAZINE) 10 MG tablet, TAKE 1 TABLET(10 MG) BY MOUTH EVERY 6 HOURS AS NEEDED FOR NAUSEA OR VOMITING, Disp: 30 tablet, Rfl: 1 .  STIOLTO RESPIMAT 2.5-2.5 MCG/ACT AERS, INHALE 2 PUFFS INTO THE LUNGS DAILY, Disp: 4 g, Rfl: 5  Physical exam:  Vitals:   07/20/19 0952  BP: (!) 100/51  Pulse: 93  Resp: 16  Temp: 98.2 F (36.8 C)  TempSrc: Oral  SpO2: 96%  Weight: 101 lb (45.8 kg)  Height: '5\' 6"'  (1.676 m)   Physical Exam Constitutional:      Comments: Patient is on home oxygen.  He is  thin and cachectic.  Appears in no acute distress  Cardiovascular:     Rate and Rhythm: Normal rate and regular rhythm.     Heart sounds: Normal heart sounds.  Pulmonary:  Effort: Pulmonary effort is normal.     Breath sounds: Normal breath sounds.  Abdominal:     General: Bowel sounds are normal.     Palpations: Abdomen is soft.     Comments: There is mild inflammation of the skin around the PEG tube site but does not appear infected overtly.  Skin:    General: Skin is warm and dry.  Neurological:     Mental Status: He is alert and oriented to person, place, and time.      CMP Latest Ref Rng & Units 06/21/2019  Glucose 70 - 99 mg/dL 130(H)  BUN 8 - 23 mg/dL 18  Creatinine 0.61 - 1.24 mg/dL 0.54(L)  Sodium 135 - 145 mmol/L 139  Potassium 3.5 - 5.1 mmol/L 4.2  Chloride 98 - 111 mmol/L 97(L)  CO2 22 - 32 mmol/L 32  Calcium 8.9 - 10.3 mg/dL 8.4(L)  Total Protein 6.5 - 8.1 g/dL 6.2(L)  Total Bilirubin 0.3 - 1.2 mg/dL 0.6  Alkaline Phos 38 - 126 U/L 47  AST 15 - 41 U/L 17  ALT 0 - 44 U/L 16   CBC Latest Ref Rng & Units 06/21/2019  WBC 4.0 - 10.5 K/uL 7.3  Hemoglobin 13.0 - 17.0 g/dL 8.8(L)  Hematocrit 39 - 52 % 27.7(L)  Platelets 150 - 400 K/uL 463(H)    No images are attached to the encounter.  NM PET Image Restag (PS) Skull Base To Thigh  Result Date: 07/19/2019 CLINICAL DATA:  Subsequent treatment strategy for squamous cell carcinoma of the larynx. Patient status post 12 radiation treatments and 4 chemotherapy treatments. EXAM: NUCLEAR MEDICINE PET SKULL BASE TO THIGH TECHNIQUE: 5.6 mCi F-18 FDG was injected intravenously. Full-ring PET imaging was performed from the skull base to thigh after the radiotracer. CT data was obtained and used for attenuation correction and anatomic localization. Fasting blood glucose: 92 mg/dl COMPARISON:  PET-CT scan 01/30/2019 FINDINGS: Mediastinal blood pool activity: SUV max 1.5 Liver activity: SUV max NA NECK: Resolution of the intense  hypermetabolic tissue previously present in the anterior and LEFT larynx. No residual hypermetabolic activity remains. Resolution of hypermetabolic LEFT level III node. No residual metabolic activity. No new hypermetabolic lymph nodes in the neck. Incidental CT findings: none CHEST: No hypermetabolic supraclavicular nodes are hypermetabolic mediastinal hilar lymph nodes. Review of the lung parenchyma demonstrates rounded atelectasis in the medial aspect of the LEFT lower lobe with moderate metabolic activity (SUV max equal 2.7). The rounded focus of atelectasis measures approximately 5 cm. Incidental CT findings: Port in the anterior chest wall with tip in distal SVC. ABDOMEN/PELVIS: There are 2 foci of hypermetabolic activity in the upper abdomen along the second portion the duodenum and adjacent to the aortocaval groove. No discrete lesion defined on the noncontrast CT. Lesions measure approximately 2 cm. The more superior has SUV max 4.2 (image 130). Slightly more inferior lesion with SUV max equal 4.3 on image 122. No lesions at these locations on comparison PET-CT. There is focal activity associated with the tract of the percutaneous cholecystostomy tube track most consistent benign postprocedural inflammation. No abnormal activity liver. Adrenal glands normal. No pelvic hypermetabolic adenopathy. Incidental CT findings: Large volume stool colon. Prostate unremarkable. SKELETON: No focal hypermetabolic activity to suggest skeletal metastasis. Incidental CT findings: none IMPRESSION: 1. Complete resolution of hypermetabolic activity associated with the larynx. 2. Resolution of hypermetabolic LEFT level III lymph node. 3. No evidence of new metastatic adenopathy in the neck. 4. No evidence thoracic metastasis to the mediastinum or  lung parenchyma. 5. Fairly large focus of rounded atelectasis in the LEFT lower lobe. Recommend attention on follow-up. 6. Two new foci of metabolic activity in upper abdomen adjacent  the second portion duodenum of unclear etiology. These would be unlikely location for metastatic lymph nodes given there is no hypermetabolic mediastinal nodal disease. Tissue planes difficult to define on the noncontrast exam. Consider follow-up contrast enhanced CT of the abdomen. Electronically Signed   By: Suzy Bouchard M.D.   On: 07/19/2019 15:59     Assessment and plan- Patient is a 69 y.o. male With Squamous cell carcinoma of the supraglottic larynx stage IVAcT4 acN1 cM0status post 5 weekly cycles of cisplatin.    He is here to discuss PET CT scan results and further management  1.  Squamous cell carcinoma supraglottic larynx stage IVA: S/p chemoradiation.  I have reviewed PET/CT scan images independently and discussed findings with the patient.  There is resolution of hypermetabolism in the area of primary mass as well as cervical adenopathy.  No findings of persistent or progressive disease.  We will also get in touch with Dr. Reola Mosher office to schedule an ENT follow-up for him.  I will plan to get repeat PET CT scan in about 4 months given nonspecific hypermetabolism noted in the area of second portion of the duodenum as well as atelectasis noted in the right lower lobe lung.  Also patient had a fairly large area of disease and could be risk of recurrence in the future.  2.  Severe protein calorie malnutrition: He continues to remain on tube feeds.  Overall his weight has gone up by 6 pounds which is encouraging.  3.  B12 deficiency: He will continue B12 shots monthly  4.  Nausea: Possibly related to PEG tube feeds.  Currently well controlled with Zyprexa  5.  Pain during swallowing: He will continue oxycodone for now and when I see him back in 2 months I will discuss slowly getting him off opioids  6.  Constipation: He will continue Movantik and I have asked him to retry MiraLAX and senna along with Movantik.  He has been using enema as needed  B12 shot in 1 month along with CBC  with differential and CMP I will see him in 2 months for his next B12 shot.  No labs at that time   Visit Diagnosis 1. Squamous cell carcinoma of larynx (HCC)   2. Severe protein-calorie malnutrition ([place])   3. Therapeutic opioid induced constipation      Dr. Randa Evens, MD, MPH [facility] at [facility] [phone] 07/20/2019 11:51 AM

## 2019-07-24 ENCOUNTER — Other Ambulatory Visit: Payer: Self-pay | Admitting: *Deleted

## 2019-07-24 ENCOUNTER — Other Ambulatory Visit: Payer: Self-pay | Admitting: Nurse Practitioner

## 2019-07-24 ENCOUNTER — Telehealth: Payer: Self-pay | Admitting: Nurse Practitioner

## 2019-07-24 DIAGNOSIS — C329 Malignant neoplasm of larynx, unspecified: Secondary | ICD-10-CM

## 2019-07-24 DIAGNOSIS — K089 Disorder of teeth and supporting structures, unspecified: Secondary | ICD-10-CM

## 2019-07-24 MED ORDER — OXYCODONE HCL 10 MG PO TABS: 10.0000 mg | ORAL_TABLET | ORAL | 0 refills | Status: DC | PRN

## 2019-07-24 MED ORDER — CITALOPRAM HYDROBROMIDE 20 MG PO TABS: ORAL_TABLET | ORAL | 2 refills | Status: DC

## 2019-07-24 NOTE — Telephone Encounter (Signed)
Copied from [place] [id number]. Topic: Referral - Status >> Jul 24, 2019 10:58 AM Lennox Solders wrote: Reason for CRM: pt is finished with chemo and radiation for laryngeal cancer and would like to proceed with the referral to oral surgeon in [place] that jolene referred him to. Pt needs 6 teeth at bottom removed and one tooth embedded in his left jaw to be removed. Pt has medicare. Pt also would like a callback.Would Pt need an in office apt first?

## 2019-07-24 NOTE — Telephone Encounter (Signed)
Please let him know I have placed this referral and will see him at follow-up in August:)

## 2019-07-24 NOTE — Telephone Encounter (Signed)
PT notified

## 2019-07-30 ENCOUNTER — Inpatient Hospital Stay: Payer: Medicare Other

## 2019-07-30 ENCOUNTER — Other Ambulatory Visit: Payer: Self-pay | Admitting: *Deleted

## 2019-07-30 NOTE — Progress Notes (Signed)
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  Chemotherapy was stopped and radiation completed on 5/13.  PEG placed on 4/8  Spoke with Kevin Nelson and she handed phone to patient.  Patient reports that his biggest issue is needing to get teeth pulled.  States that he is on long list at [facility] for dental work to be done.  Poor dentition effecting oral intake.  Reports taste is coming back for some things.  Nausea has resolved.   Continues to give 3 cartons of osmolite 1.5 via feeding tube per day (1 carton TID).  Reports bowels are moving better without enema.    Medications: reviewed  Labs: reviewed  Anthropometrics:   Weight 101 lb on 7/2 increased from 100 lb 1.6 oz on 6/23 96 lb 6/3   Estimated Energy Needs  Kcals: 1500-1750 Protein: 75-88 g Fluid: > 1.5 L  NUTRITION DIAGNOSIS: Inadequate oral intake continues   INTERVENTION:  Patient to continue osmolite 1.5, 3 cartons daily to better meet nutritional needs. Patient to continue to utilize feeding tube in addition to eating orally to better meet nutritional needs and increase weight.  Provides 1065 calories, 44.7 g protein and 900 ml free water.  Patient to continue eating high calorie, high protein, soft foods orally to meet nutritional needs.   Patient has contact information  MONITORING, EVALUATION, GOAL: weight trends, intake, tube feeding   NEXT VISIT: August 2nd (Monday) face to face after injection/port flush  Kevin Nelson B. Kevin Nelson, [place], [place] Registered Dietitian [phone] (pager)

## 2019-07-31 ENCOUNTER — Telehealth: Payer: Self-pay

## 2019-07-31 ENCOUNTER — Ambulatory Visit: Payer: Self-pay | Admitting: General Practice

## 2019-07-31 MED ORDER — OXYCODONE HCL ER 10 MG PO T12A: 10.0000 mg | EXTENDED_RELEASE_TABLET | Freq: Two times a day (BID) | ORAL | 0 refills | Status: DC

## 2019-07-31 NOTE — Chronic Care Management (AMB) (Signed)
°  Chronic Care Management   Outreach Note  07/31/2019 Name: Kevin Nelson MRN: [id number] DOB: 04-20-1950  Referred by: [name], NP Reason for referral : Chronic Care Management (Follow up: RNCM Chronic Disease Management and Care Coordination Needs)   An unsuccessful telephone outreach was attempted today. The patient was referred to the case management team for assistance with care management and care coordination.   Follow Up Plan: A HIPPA compliant phone message was left for the patient providing contact information and requesting a return call.   Noreene Larsson RN, MSN, [facility] Mobile: [phone]

## 2019-08-03 ENCOUNTER — Telehealth: Payer: Self-pay | Admitting: Nurse Practitioner

## 2019-08-03 NOTE — Telephone Encounter (Signed)
[name], pts girlfriend, called in and would like to have a urgent referral placed to have teeth surgically removed. Pt was was referred in May but [place] didn't not call back. Due to chemo he had to push this back but now he has been given the green light to get this done and has 1 tooth that has already broken and all the others are very fragile and they are breaking. Pt would like to be seen as close to home as possible but would also like to make sure that they are at a facility that can take care of pts with his current health conditions.

## 2019-08-03 NOTE — Telephone Encounter (Signed)
Referral was placed 07/24/19, please check on this and update patient.  Thank you.

## 2019-08-06 NOTE — Telephone Encounter (Signed)
Message sent to referral team to check on status of referral.

## 2019-08-07 NOTE — Telephone Encounter (Signed)
Message from [facility] in referrals- "I faxed it to them. The patient can also go online to [employer] and register themselves".    Called and let patient's girlfriend the status of the referral. [facility] and provided patient's girlfriend with their phone number so they could call and check the status of the referral and register online. Asked for them to call us back with any questions or concerns.

## 2019-08-15 ENCOUNTER — Telehealth: Payer: Self-pay | Admitting: *Deleted

## 2019-08-15 ENCOUNTER — Other Ambulatory Visit: Payer: Self-pay | Admitting: Nurse Practitioner

## 2019-08-15 NOTE — Telephone Encounter (Signed)
Requested medication (s) are due for refill today: no  Requested medication (s) are on the active medication list: yes  Last refill:  05/30/2019  Future visit scheduled: yes  Notes to clinic:  this refill cannot be delegated    Requested Prescriptions  Pending Prescriptions Disp Refills   clonazePAM (KLONOPIN) 1 MG tablet [Pharmacy Med Name: CLONAZEPAM 1MG  TABLETS] 60 tablet     Sig: TAKE 1 TABLET(1 MG) BY MOUTH TWICE DAILY AS NEEDED FOR ANXIETY      Not Delegated - Psychiatry:  Anxiolytics/Hypnotics Failed - 08/15/2019 12:39 PM      Failed - This refill cannot be delegated      Failed - Urine Drug Screen completed in last 360 days.      Passed - Valid encounter within last 6 months    Recent Outpatient Visits           2 months ago Squamous cell carcinoma of larynx ([place])   [place] Cannady, [name], NP   5 months ago Laryngeal cancer ([place])   [place], [name], NP   7 months ago Sore throat   [place], [place] T, NP   9 months ago COPD, very severe ([place])   [place], [name] T, NP   10 months ago COPD exacerbation ([place])   [place], [name], NP       Future Appointments             In 1 month Cannady, [name], NP [employer]   In 5 months  [employer]

## 2019-08-15 NOTE — Telephone Encounter (Signed)
Patti left message that patient is having problems with his feeding tube, that his weight is up to 100 lbs now and that he wants it removed. Please advise

## 2019-08-15 NOTE — Telephone Encounter (Signed)
I called [name] back and spoke with her about how the patient has been doing.  States that she is still giving him 2 cans of tube feeding each day.  Day for his 1 meal he ate cube steak ,beans ,corn, macaroni and cheese.  He does not have any trouble when he eats.  Eats small amounts but eats several times.  Chong Sicilian says that his tube looks bad, crusty like the formula that is given to him and the tube has caked up inside of the tube. She states that when she opens the tube so that she can give him the next feeding it has a horrible smell.  It is leaking small amount on the side.  The patient and Chong Sicilian feels like that he is back to 100 pounds and he would like to have his tube taken out.  Dr. Janese Banks was on vacation so I talked to Dr. Rogue Bussing.  Dr. Rogue Bussing suggest that I send a message to Almyra Free the nutritionist and let her help [place] make a decision.  I told [name] that I would send her the message and get back with her as soon as she answers me.  [name] also said that when it is time to get out that they want to make sure that he gets his port access so they can be used to give him any medicine for the procedure.  Last time when he got his tube inserted that they had put an IV in his hand, they gave him medicine and his hand was swollen and sore.  And while he has the port in they prefer to use it.  She also request that when he leaves that he gets pain medicine for home use because last time when he went home he was hurting so bad that she had to bring him back and he ended up having to get morphine in order to calm down the pain from the insertion of the tube.  Once that I got permission to have the tube discontinued that I can call the appropriate department and talk to them about the other issues that she had the first time.  She is agreeable and I will call her back when I have answers

## 2019-08-16 DIAGNOSIS — Z23 Encounter for immunization: Secondary | ICD-10-CM | POA: Diagnosis not present

## 2019-08-16 NOTE — Telephone Encounter (Signed)
I called and spoke to patti and told her the rec: of the dietician per the request of Dr. Rogue Bussing. She says that I will need to talk to him because he is not going to like this. Pt. Gets on the phone and he said he is tired of having it. He is not going to wait 4 weeks . He feels the tube is a hendrence to eat more because when he takes a can of feeding it makes him full for  about 5 hours and he can't eat during that time because of how his belly feels. He has his teeth that he is trying to get fixed at [facility]. He says the tube is nasty, stinking and that keeps him from being able to eat more. He wants it out and soon. I told him we could look at tubing to see what we can do and the answer is to set up appt to get it out. Last time he states and so did patti- they stuck him in the hand and gave him fluids through it and it was not in vein and his hand was swollen and it made his hand like a golf ball on top of it and it hurt and took a while to get it back to normal. He wants them to use port for giving fluids or IV meds. He wants me to tell the doctor this and make appt to have the tube removed. He also hurt really bad last time when he got it and wants to have pain meds for after it is d/c so he is not in misery. Last time he got it put in he had to go to ER and they had to give him IV pain med to help with pain. I told him I will relay the info to MD and  let them know how to proceed.

## 2019-08-16 NOTE — Telephone Encounter (Signed)
We can see him in [facility] to evaluate tube and discuss rationale for keeping tube vs removing if needed

## 2019-08-16 NOTE — Telephone Encounter (Signed)
Ideally, patient needs to not use feeding tube for feeding or hydration for at least 4 weeks and be able to maintain weight (100 lb) and hydration.  During this time frame patient will need to flush feeding tube 1 time daily with 1 syringe (60 ml of water) until tube is removed. If tube is considered to be causing infection/increased risk to patient then earlier removal maybe necessary.  An appointment (face to face) needs to be made with RD in 4 weeks to measure weight and nutritional status.

## 2019-08-17 ENCOUNTER — Other Ambulatory Visit: Payer: Self-pay | Admitting: *Deleted

## 2019-08-17 DIAGNOSIS — Z931 Gastrostomy status: Secondary | ICD-10-CM

## 2019-08-17 DIAGNOSIS — C329 Malignant neoplasm of larynx, unspecified: Secondary | ICD-10-CM

## 2019-08-17 NOTE — Telephone Encounter (Signed)
Spoke to patient. He requests tube removal. I voiced concerns and my recommendation to keep tube. He declines and would like to proceed with removal. Judeen Hammans, can you please coordinate referral to interventional radiology for removal. He would like port accessed and deaccessed if he will require iv medications or fluids and that may need to happen at the cancer center. Could you coordinate? Please let me know if questions or concerns. Thanks!

## 2019-08-17 NOTE — Telephone Encounter (Signed)
I will fill out form to have it to scheduled it to be taken out. I will also write special instructions about the use of port to give fluids and pain meds through it.

## 2019-08-20 ENCOUNTER — Other Ambulatory Visit: Payer: Self-pay

## 2019-08-20 ENCOUNTER — Inpatient Hospital Stay: Payer: Medicare Other

## 2019-08-20 ENCOUNTER — Other Ambulatory Visit: Payer: Self-pay | Admitting: *Deleted

## 2019-08-20 ENCOUNTER — Inpatient Hospital Stay: Payer: Medicare Other | Attending: Oncology

## 2019-08-20 DIAGNOSIS — C329 Malignant neoplasm of larynx, unspecified: Secondary | ICD-10-CM

## 2019-08-20 DIAGNOSIS — E538 Deficiency of other specified B group vitamins: Secondary | ICD-10-CM

## 2019-08-20 LAB — CBC WITH DIFFERENTIAL/PLATELET
Abs Immature Granulocytes: 0.03 10*3/uL (ref 0.00–0.07)
Basophils Absolute: 0.1 10*3/uL (ref 0.0–0.1)
Basophils Relative: 1 %
Eosinophils Absolute: 0.2 10*3/uL (ref 0.0–0.5)
Eosinophils Relative: 3 %
HCT: 30 % — ABNORMAL LOW (ref 39.0–52.0)
Hemoglobin: 9.8 g/dL — ABNORMAL LOW (ref 13.0–17.0)
Immature Granulocytes: 0 %
Lymphocytes Relative: 18 %
Lymphs Abs: 1.2 10*3/uL (ref 0.7–4.0)
MCH: 28.3 pg (ref 26.0–34.0)
MCHC: 32.7 g/dL (ref 30.0–36.0)
MCV: 86.7 fL (ref 80.0–100.0)
Monocytes Absolute: 0.9 10*3/uL (ref 0.1–1.0)
Monocytes Relative: 13 %
Neutro Abs: 4.4 10*3/uL (ref 1.7–7.7)
Neutrophils Relative %: 65 %
Platelets: 247 10*3/uL (ref 150–400)
RBC: 3.46 MIL/uL — ABNORMAL LOW (ref 4.22–5.81)
RDW: 15.7 % — ABNORMAL HIGH (ref 11.5–15.5)
WBC: 6.8 10*3/uL (ref 4.0–10.5)
nRBC: 0 % (ref 0.0–0.2)

## 2019-08-20 LAB — COMPREHENSIVE METABOLIC PANEL
ALT: 11 U/L (ref 0–44)
AST: 18 U/L (ref 15–41)
Albumin: 3.7 g/dL (ref 3.5–5.0)
Alkaline Phosphatase: 56 U/L (ref 38–126)
Anion gap: 5 (ref 5–15)
BUN: 16 mg/dL (ref 8–23)
CO2: 34 mmol/L — ABNORMAL HIGH (ref 22–32)
Calcium: 8.1 mg/dL — ABNORMAL LOW (ref 8.9–10.3)
Chloride: 94 mmol/L — ABNORMAL LOW (ref 98–111)
Creatinine, Ser: 0.41 mg/dL — ABNORMAL LOW (ref 0.61–1.24)
GFR calc Af Amer: >60 mL/min (ref >60)
GFR calc non Af Amer: >60 mL/min (ref >60)
Glucose, Bld: 128 mg/dL — ABNORMAL HIGH (ref 70–99)
Potassium: 4.2 mmol/L (ref 3.5–5.1)
Sodium: 133 mmol/L — ABNORMAL LOW (ref 135–145)
Total Bilirubin: 0.6 mg/dL (ref 0.3–1.2)
Total Protein: 6.7 g/dL (ref 6.5–8.1)

## 2019-08-20 LAB — PHOSPHORUS: Phosphorus: 3.9 mg/dL (ref 2.5–4.6)

## 2019-08-20 LAB — MAGNESIUM: Magnesium: 1.8 mg/dL (ref 1.7–2.4)

## 2019-08-20 MED ORDER — CYANOCOBALAMIN 1000 MCG/ML IJ SOLN
1000.0000 ug | Freq: Once | INTRAMUSCULAR | Status: AC
  Administered 2019-08-20: 1000 ug via INTRAMUSCULAR
  Filled 2019-08-20: qty 1

## 2019-08-20 MED ORDER — CYANOCOBALAMIN 1000 MCG/ML IJ SOLN: 1000.0000 ug | Freq: Once | INTRAMUSCULAR | Status: DC

## 2019-08-20 MED ORDER — NALOXEGOL OXALATE 25 MG PO TABS: 25.0000 mg | ORAL_TABLET | Freq: Every day | ORAL | 0 refills | Status: DC

## 2019-08-20 MED ORDER — HEPARIN SOD (PORK) LOCK FLUSH 100 UNIT/ML IV SOLN
INTRAVENOUS | Status: AC
  Filled 2019-08-20: qty 5

## 2019-08-20 MED ORDER — HEPARIN SOD (PORK) LOCK FLUSH 100 UNIT/ML IV SOLN
500.0000 [IU] | Freq: Once | INTRAVENOUS | Status: AC
  Administered 2019-08-20: 500 [IU] via INTRAVENOUS
  Filled 2019-08-20: qty 5

## 2019-08-20 MED ORDER — SODIUM CHLORIDE 0.9% FLUSH
10.0000 mL | Freq: Once | INTRAVENOUS | Status: AC
  Administered 2019-08-20: 10 mL via INTRAVENOUS
  Filled 2019-08-20: qty 10

## 2019-08-20 NOTE — Progress Notes (Signed)
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  Chemotherapy was stopped and radiation completed on 5/13.  PEG placed on 4/8.    Notes reviewed from NP and RN regarding feeding tube removal.  Patient requesting tube removal.    Met with patient in infusion following port flush and vit B 12 injection. Patient states that he is going to have feeding tube removed.  Feels full with 2 cartons of tube feeding and trying to eat 3 meals per day.     INTERVENTION:  Patient does not want to keep tube in place for 4 weeks and not use it to see if can maintain weight.   Stressed importance of making up calorie difference (710 calories and 30 g protein) through oral intake for weight maintenance.  Discussed ways to add additional calories (ensure plus, boost plus, etc).   Provided patient with contact information.   NEXT VISIT: no follow-up. Patient to contact RD if needed in the future.    Laurissa Cowper B. Zenia Resides, [place], [place] Registered Dietitian [phone] (mobile)

## 2019-08-23 ENCOUNTER — Other Ambulatory Visit: Payer: Self-pay | Admitting: *Deleted

## 2019-08-23 DIAGNOSIS — C329 Malignant neoplasm of larynx, unspecified: Secondary | ICD-10-CM

## 2019-08-23 MED ORDER — OXYCODONE HCL 10 MG PO TABS: 10.0000 mg | ORAL_TABLET | ORAL | 0 refills | Status: DC | PRN

## 2019-08-24 ENCOUNTER — Ambulatory Visit: Admission: RE | Admit: 2019-08-24 | Payer: Medicare Other | Source: Ambulatory Visit

## 2019-08-27 ENCOUNTER — Telehealth: Payer: Self-pay | Admitting: *Deleted

## 2019-08-27 DIAGNOSIS — Z1152 Encounter for screening for COVID-19: Secondary | ICD-10-CM | POA: Diagnosis not present

## 2019-08-27 DIAGNOSIS — Z20828 Contact with and (suspected) exposure to other viral communicable diseases: Secondary | ICD-10-CM | POA: Diagnosis not present

## 2019-08-27 DIAGNOSIS — Z03818 Encounter for observation for suspected exposure to other biological agents ruled out: Secondary | ICD-10-CM | POA: Diagnosis not present

## 2019-08-27 NOTE — Telephone Encounter (Signed)
I called pt and he states that they both got tested. One rapid and another one that will be reported hopefully by end of the day. He got the rapid results already and it was neg.I told him that the g tube removal is scheduled for this Friday at 11 am arrival for 11:30 procedure and I spoke to the nurse and the doctor that will be doing procedure and they do use local numbing to the area. He will go home with 2 x 4 or 4 x4 over the hole that the g tube was placed. No stitches needed and the site should seal up on its on over few days per the team in IR. If pt has pain he can take pain med 1 hour before coming to the appt. Pt will call me  tom. If he tests positive one second test.

## 2019-08-27 NOTE — Telephone Encounter (Signed)
Kevin Nelson called reporting that she and Lennette Bihari have been exposed to [place] [facility] and that Lennette Bihari has been running a fever. Asking to bring patient in and herself for COVID testing. I returned call to patient and advised that we do not do testing at our office and never have. Suggested they go to Urgent care and he said that they went to [facility] walk in and left due to it being so full. I advised that he try another facility to be tested

## 2019-08-29 ENCOUNTER — Other Ambulatory Visit: Payer: Self-pay | Admitting: Oncology

## 2019-08-29 ENCOUNTER — Ambulatory Visit (INDEPENDENT_AMBULATORY_CARE_PROVIDER_SITE_OTHER): Payer: Medicare Other | Admitting: Licensed Clinical Social Worker

## 2019-08-29 DIAGNOSIS — I2510 Atherosclerotic heart disease of native coronary artery without angina pectoris: Secondary | ICD-10-CM

## 2019-08-29 DIAGNOSIS — F411 Generalized anxiety disorder: Secondary | ICD-10-CM

## 2019-08-29 DIAGNOSIS — J449 Chronic obstructive pulmonary disease, unspecified: Secondary | ICD-10-CM

## 2019-08-29 DIAGNOSIS — C61 Malignant neoplasm of prostate: Secondary | ICD-10-CM

## 2019-08-29 NOTE — Chronic Care Management (AMB) (Signed)
Chronic Care Management    Clinical Social Work Follow Up Note  08/29/2019 Name: Kevin Nelson MRN: [id number] DOB: 1950-02-24  Kevin Nelson is a 69 y.o. year old male who is a primary care patient of Cannady, [name], NP. The CCM team was consulted for assistance with [employer] .   Review of patient status, including review of consultants reports, other relevant assessments, and collaboration with appropriate care team members and the patient's provider was performed as part of comprehensive patient evaluation and provision of chronic care management services.    SDOH (Social Determinants of Health) assessments performed: Yes    Outpatient Encounter Medications as of 08/29/2019  Medication Sig Note  . citalopram (CELEXA) 20 MG tablet TAKE 1 TABLET(20 MG) BY MOUTH DAILY   . clonazePAM (KLONOPIN) 1 MG tablet TAKE 1 TABLET(1 MG) BY MOUTH TWICE DAILY AS NEEDED FOR ANXIETY   . COMBIVENT RESPIMAT 20-100 MCG/ACT AERS respimat INHALE 1 PUFF BY MOUTH INTO THE LUNGS EVERY 6 HOURS AS NEEDED   . fluticasone (FLONASE) 50 MCG/ACT nasal spray SHAKE LIQUID AND USE 1 SPRAY IN EACH NOSTRIL TWICE DAILY   . ipratropium-albuterol (DUONEB) 0.5-2.5 (3) MG/3ML SOLN Take 3 mLs by nebulization every 6 (six) hours as needed. 05/09/2019: Using 3-4 times daily  . naloxegol oxalate (MOVANTIK) 25 MG TABS tablet Take 1 tablet (25 mg total) by mouth daily.   . Nutritional Supplements (FEEDING SUPPLEMENT, OSMOLITE 1.5 CAL,) LIQD Give 5 cartons per day via feeding tube (1 1/2 cartons at 8am and noon and 1 carton at 4pm and 8pm).  Flush with 77ml of water before and after each feeding.  Provides 1775 calories, 74.5 g protein and 1856ml free water and meets 100% of nutritional needs.  Send bolus supplies.   [name] OLANZapine (ZYPREXA) 10 MG tablet Take 1 tablet (10 mg total) by mouth at bedtime.   [name] oxyCODONE (OXYCONTIN) 10 mg 12 hr tablet Take 1 tablet (10 mg total) by mouth every 12 (twelve) hours.   . Oxycodone HCl 10 MG  TABS Take 1 tablet (10 mg total) by mouth every 4 (four) hours as needed.   . OXYGEN Inhale 3 L into the lungs daily.   . pantoprazole (PROTONIX) 20 MG tablet TAKE 1 TABLET(20 MG) BY MOUTH DAILY   . phenazopyridine (PYRIDIUM) 100 MG tablet Take 1 tablet (100 mg total) by mouth 3 (three) times daily as needed for pain.   . polyethylene glycol powder (CLEARLAX) 17 GM/SCOOP powder Take 1 Container by mouth once.   . potassium & sodium phosphates (PHOS-NAK) 280-160-250 MG PACK Take 1 packet by mouth 2 (two) times daily. For hypophosphatemia. Give via PEG.   . prochlorperazine (COMPAZINE) 10 MG tablet TAKE 1 TABLET(10 MG) BY MOUTH EVERY 6 HOURS AS NEEDED FOR NAUSEA OR VOMITING   . STIOLTO RESPIMAT 2.5-2.5 MCG/ACT AERS INHALE 2 PUFFS INTO THE LUNGS DAILY    No facility-administered encounter medications on file as of 08/29/2019.     Goals Addressed    .  SW-"I need more support. I'm anxious all the time" (pt-stated)        Current Barriers:  [name] Knowledge Deficits related to community resources that are available to him and within his area . Financial Constraints - managing health care expenses  . Health related barriers-cancerous larynx  Clinical Social Worker Clinical Goal(s):  [name] Over the next 90 days, patient will verbalize understanding of plan for gaining crisis support resource education and implementing appropriate self-care tools into his daily routine  to improve overall self-care . Over the next 90 days, patient will work with [facility] LCSW to address needs related to increasing self-care and self-care education to cope with financial barriers and stressors.      Interventions: . Patient interviewed and appropriate assessments performed . Provided mental health counseling with regard to coping with daily stress. [name] LCSW provided education on deep breathing and relaxation techniques to implement into his daily routine to combat stressors. Patient admits that his anxiety continues but he has  been challenging his negative thinking in order to gain a more positive perspective. Patient admits that he could benefit from socialization but due to [place] he has been staying at home and taking appropriate precautions. LCSW provided education on available mental health support resources and socialization opportunities within his community. . Provided patient with information about low income housing resources that are available. However, currently Section 8 and Public Housing wait list is still closed.  [name] LCSW provided reflective listening and implemented appropriate interventions to help suppport patient and her emotional needs  . Discussed plans with patient for ongoing care management follow up and provided patient with direct contact information for care management team . Advised patient to follow up with provided community resources as needed . Assisted patient/caregiver with obtaining information about health plan benefits . Patient confirms stable transportation to his upcoming PCP appointment next month. . Patient admits ongoing SOB. LCSW provided education on ways to induce the relaxation body response to help with deep breathing.  . Patient reports that his anxiety is "more manageable" due to medication dosage adjustment/increase.  . Patient reports ongoing dryness of the mouth and dehydration. However, he is currently drinking pedialyte which has been helpful. Healthy self-care education provided.  . Evaluation of current status of Laryngeal cancer and treatment options. On 08/29/19, patient states that his cancer was successfully treated and he no longer has to receive chemotherapy.  . Patient reports that he continues to smoke only 5 cigarettes per day but desires to continue decreasing this intake. Positive reinforcement provided.  . Patient reports a recent increase in anxiety within the last few weeks because his family was exposed to [place] and his father in law had to be admitted at  the hospital for [place]. Father in law will discharge back home tomorrow and family is relieved. Appropriate emotional support was provided to patient for this new stressors. LCSW validated his current feelings/concerns/thoughts.  [name] LCSW reviewed all upcoming medical appointments with patient. Patient reports having an upcoming dental appointment in [facility] that he is excited abut. He also states that he will have his feeding tube removed this on 08/31/19. [name] Patient states that he and his spouse will receive their 2nd COVID vaccines on 09/13/19 at [facility].   Patient Self Care Activities:  . Attends all scheduled provider appointments . Calls provider office for new concerns or questions  Please see past updates related to this goal by clicking on the "Past Updates" button in the selected goal       Follow Up Plan: SW will follow up with patient by phone over the next quarter  Eula Fried, [place], MSW, [email or web address] Phone: [phone]

## 2019-08-31 ENCOUNTER — Other Ambulatory Visit: Payer: Self-pay

## 2019-08-31 ENCOUNTER — Ambulatory Visit
Admission: RE | Admit: 2019-08-31 | Discharge: 2019-08-31 | Disposition: A | Discharge status: Home / Self Care | Payer: Medicare Other | Source: Ambulatory Visit | Attending: Nurse Practitioner | Admitting: Nurse Practitioner

## 2019-08-31 ENCOUNTER — Ambulatory Visit: Payer: Medicare Other

## 2019-08-31 DIAGNOSIS — Z931 Gastrostomy status: Secondary | ICD-10-CM

## 2019-08-31 MED ORDER — LIDOCAINE VISCOUS HCL 2 % MT SOLN
OROMUCOSAL | Status: AC
  Filled 2019-08-31: qty 15

## 2019-08-31 NOTE — Progress Notes (Signed)
Patient here today for G tube removal per Dr Vernard Gambles. Lidocaine viscus instilled to area for local prior to removal of Gtube. Tolerated well with gauze dressing applied post removal. Discharged after without complaints.

## 2019-09-05 ENCOUNTER — Ambulatory Visit: Payer: Self-pay | Admitting: General Practice

## 2019-09-05 ENCOUNTER — Telehealth: Payer: Medicare Other | Admitting: General Practice

## 2019-09-05 DIAGNOSIS — C329 Malignant neoplasm of larynx, unspecified: Secondary | ICD-10-CM

## 2019-09-05 DIAGNOSIS — J449 Chronic obstructive pulmonary disease, unspecified: Secondary | ICD-10-CM

## 2019-09-05 DIAGNOSIS — F17210 Nicotine dependence, cigarettes, uncomplicated: Secondary | ICD-10-CM

## 2019-09-05 DIAGNOSIS — F411 Generalized anxiety disorder: Secondary | ICD-10-CM

## 2019-09-05 DIAGNOSIS — C61 Malignant neoplasm of prostate: Secondary | ICD-10-CM

## 2019-09-05 DIAGNOSIS — I2510 Atherosclerotic heart disease of native coronary artery without angina pectoris: Secondary | ICD-10-CM | POA: Diagnosis not present

## 2019-09-05 DIAGNOSIS — K089 Disorder of teeth and supporting structures, unspecified: Secondary | ICD-10-CM

## 2019-09-05 NOTE — Patient Instructions (Signed)
Visit Information  Goals Addressed              This Visit's Progress   .  RN-COPD Management/Laryngeal Cancer diagnosis (pt-stated)        Current Barriers:  [name] Knowledge deficits related to basic COPD self care/management . Knowledge deficits related to Laryngeal Cancer and self/care management  . Financial barriers . Limited Social Support   Case Manager Clinical Goal(s):  Over the next 90 days, patient will be able to verbalize understanding of COPD action plan and when to seek appropriate levels of medical care  Over the next 90 days, patient will engage in lite exercise as tolerated to build/regain stamina and strength and reduce shortness of breath through activity tolerance  Over the next 90 days, patient will verbalize basic understanding of COPD disease process and self care activities  Over the next 90 days the patient witl verbalized basic understanding of Laryngeal Cancer disease process and self care activities    Interventions:  . Reviewed COPD action plan . Patient reports having all COPD medications . Reviewed upcoming appointment with Pulmonology.  The patient is doing some "soul searching" and talking to his fiance about important decisions that need to be made concerning his health and well being/care.  09-05-2019: The patient states he is doing well and happy that he is over his treatments for his cancer. He is moving forward with going to the dentist and getting his teeth fixed. Recently he had his g tube removed and he says the area is sore but he denies any acute distress. He is eating well.   . Patient reporting he is taking his medications as directed . Continues to smoke, with no interest in quitting- 09-05-2019: Is smoking about 5 cigarettes a day. Will continue to work on cutting back . Continues to be on 02 . Evaluation of current status of Laryngeal cancer and treatment options.  09-05-2019: The patient has completed his chemotherapy. He feels he is doing  well.  . The patient has completed his chemotherapy regimen and is now taking radiation. The patient is feeling much better. 09-05-2019: The patient has an upcoming appointment in [facility] to have teeth pulled. 09-19-2019.  The patient has top dentures but wants to have bottom teeth removed so he can get bottom dentures and be able to eat a regular diet again.  . Evaluation of anxiety.  The patient states he is feeling better each day. He wants to keep doing things he is enjoying and stay safe. He has had some "cold-like" symptoms since getting the first COVID vaccine but denies any acute distress.  . Evaluation of smoking. The patient verbalized he is down to 5 cigarettes a day.  The patient admits that is not enough but it is better than what it was. Praised the patient for small accomplishments. The patient was happier on the call today and thanked the [facility] for ongoing support and help.  . Reports that he has had his first COVID vaccine and gets his second one on 09-13-2019.  [name] Evaluation of upcoming appointments. Next appointment with pcp on 09-18-2019 at [place] with pcp.    Patient Self Care Activities:  Takes medications as prescribed including inhalers  Utilizes infection prevention strategies to reduce risk of respiratory infection   Does not currently perform self care activities related to COPD management  Please see past updates related to this goal by clicking on the "Past Updates" button in the selected goal      .  COMPLETED: RN: I have some big decisions to make        Current Barriers: Closing this goal currently as the patient has finished his treatments and is doing well at this time.  . Film/video editor.  . Chronic Disease Management support and education needs related to Chronic health conditions impacting the patients care, currently having to decide about treatment options for Laryngeal cancer and the patients concern of "quality of life vs quantity of life" . No  Advanced Directives in place  Nurse Case Manager Clinical Goal(s):  [name] Over the next 120 days, patient will work with pcp and CCM team to address needs related to prognosis of patient with chronic medical conditions/terminal illness diagnosis . Over the next 120 days, patient will attend all scheduled medical appointments: patient has been actively taking chemotherapy but has decided to take a week off. . Over the next 120 days, patient will demonstrate improved health management independence as evidenced byremaining independent with his care and ability to make decisions about his health and well being independently . Over the next 90 days, patient will work with CM team pharmacist to manage medications . Over the next 90 days, patient will work with CM clinical social worker to decrease stress and anxiety and assist as needed with financial needs . Over the next 90 days, the patient will work with the care management team towards completion of advanced directives  Interventions:  . Evaluation of current treatment plan related to chronic medical conditions/terminal illness diagnosis and patient's adherence to plan as established by provider. The patient has completed his chemotherapy at this time. He is still taking radiation. They have not been able to do a PET scan per the patient.  The patient states he is feeling much better.  . Provided education to patient and/or caregiver about advanced directives . Provided education to patient re: giving the patient samples of Ensure to try to see if this is something that will help with his nutritional health and goals.  The patient states he has sores in his mouth and this is preventing him from eating or drinking much of anything.  The sores are an ongoing issue but he states today they are better. The patient was having a lot of nausea but he is now taking the "nausea" medication and it has been very helpful for him.  He is still having to take 80% of his  nutrition by his peg tube but he is feeling much better and states the nausea medication has helped him so much.  . Reviewed medications with patient and discussed compliance . Collaborated with CCM team regarding ongoing support and needs related to complex chronic medical conditions and terminal illness diagnosis.  The patient is feeling much better and states he is even getting out with his fiance and they are enjoying things.  . Discussed plans with patient for ongoing care management follow up and provided patient with direct contact information for care management team . Reviewed scheduled/upcoming provider appointments including: saw pcp on 06/05/2019. Next appointment with pcp on 08/31/2019 . Social Work referral for continued support for emotional and financial needs . Pharmacy referral for continued medication management and support  Patient Self Care Activities:  . Patient verbalizes understanding of plan to assist in management of chronic medical conditions and terminal illness diagnosis . Self administers medications as prescribed . Attends all scheduled provider appointments . Calls provider office for new concerns or questions . Unable to independently decide the best course  of action for his chronic medical condition and terminal illness diagnosis   Please see past updates related to this goal by clicking on the "Past Updates" button in the selected goal         Patient verbalizes understanding of instructions provided today.   Telephone follow up appointment with care management team member scheduled for: 10-31-2019 at 48 pm  Noreene Larsson RN, MSN, [facility] Mobile: [phone]

## 2019-09-05 NOTE — Chronic Care Management (AMB) (Signed)
Chronic Care Management   Follow Up Note   09/05/2019 Name: Kevin Nelson MRN: [id number] DOB: 06-May-1950  Referred by: [name], NP Reason for referral : Chronic Care Management (RNCM follow up call for Chronic Disease Management and Care Coordination Needs)   Kevin Nelson is a 69 y.o. year old male who is a primary care patient of Cannady, [name], NP. The CCM team was consulted for assistance with chronic disease management and care coordination needs.    Review of patient status, including review of consultants reports, relevant laboratory and other test results, and collaboration with appropriate care team members and the patient's provider was performed as part of comprehensive patient evaluation and provision of chronic care management services.    SDOH (Social Determinants of Health) assessments performed: Yes See Care Plan activities for detailed interventions related to [facility])     Outpatient Encounter Medications as of 09/05/2019  Medication Sig Note  . citalopram (CELEXA) 20 MG tablet TAKE 1 TABLET(20 MG) BY MOUTH DAILY   . clonazePAM (KLONOPIN) 1 MG tablet TAKE 1 TABLET(1 MG) BY MOUTH TWICE DAILY AS NEEDED FOR ANXIETY   . COMBIVENT RESPIMAT 20-100 MCG/ACT AERS respimat INHALE 1 PUFF BY MOUTH INTO THE LUNGS EVERY 6 HOURS AS NEEDED   . fluticasone (FLONASE) 50 MCG/ACT nasal spray SHAKE LIQUID AND USE 1 SPRAY IN EACH NOSTRIL TWICE DAILY   . ipratropium-albuterol (DUONEB) 0.5-2.5 (3) MG/3ML SOLN Take 3 mLs by nebulization every 6 (six) hours as needed. 05/09/2019: Using 3-4 times daily  . naloxegol oxalate (MOVANTIK) 25 MG TABS tablet Take 1 tablet (25 mg total) by mouth daily.   . Nutritional Supplements (FEEDING SUPPLEMENT, OSMOLITE 1.5 CAL,) LIQD Give 5 cartons per day via feeding tube (1 1/2 cartons at 8am and noon and 1 carton at 4pm and 8pm).  Flush with 2ml of water before and after each feeding.  Provides 1775 calories, 74.5 g protein and 1817ml free water and meets  100% of nutritional needs.  Send bolus supplies.   [name] OLANZapine (ZYPREXA) 10 MG tablet Take 1 tablet (10 mg total) by mouth at bedtime.   [name] oxyCODONE (OXYCONTIN) 10 mg 12 hr tablet Take 1 tablet (10 mg total) by mouth every 12 (twelve) hours.   . Oxycodone HCl 10 MG TABS Take 1 tablet (10 mg total) by mouth every 4 (four) hours as needed.   . OXYGEN Inhale 3 L into the lungs daily.   . pantoprazole (PROTONIX) 20 MG tablet TAKE 1 TABLET(20 MG) BY MOUTH DAILY   . phenazopyridine (PYRIDIUM) 100 MG tablet Take 1 tablet (100 mg total) by mouth 3 (three) times daily as needed for pain.   . polyethylene glycol powder (CLEARLAX) 17 GM/SCOOP powder Take 1 Container by mouth once.   . potassium & sodium phosphates (PHOS-NAK) 280-160-250 MG PACK Take 1 packet by mouth 2 (two) times daily. For hypophosphatemia. Give via PEG.   . prochlorperazine (COMPAZINE) 10 MG tablet TAKE 1 TABLET(10 MG) BY MOUTH EVERY 6 HOURS AS NEEDED FOR NAUSEA OR VOMITING   . STIOLTO RESPIMAT 2.5-2.5 MCG/ACT AERS INHALE 2 PUFFS INTO THE LUNGS DAILY    No facility-administered encounter medications on file as of 09/05/2019.     Objective:   Goals Addressed              This Visit's Progress   .  RN-COPD Management/Laryngeal Cancer diagnosis (pt-stated)        Current Barriers:  [name] Knowledge deficits related to basic COPD self  care/management . Knowledge deficits related to Laryngeal Cancer and self/care management  . Financial barriers . Limited Social Support   Case Manager Clinical Goal(s):  Over the next 90 days, patient will be able to verbalize understanding of COPD action plan and when to seek appropriate levels of medical care  Over the next 90 days, patient will engage in lite exercise as tolerated to build/regain stamina and strength and reduce shortness of breath through activity tolerance  Over the next 90 days, patient will verbalize basic understanding of COPD disease process and self care activities  Over  the next 90 days the patient witl verbalized basic understanding of Laryngeal Cancer disease process and self care activities    Interventions:  . Reviewed COPD action plan . Patient reports having all COPD medications . Reviewed upcoming appointment with Pulmonology.  The patient is doing some "soul searching" and talking to his fiance about important decisions that need to be made concerning his health and well being/care.  09-05-2019: The patient states he is doing well and happy that he is over his treatments for his cancer. He is moving forward with going to the dentist and getting his teeth fixed. Recently he had his g tube removed and he says the area is sore but he denies any acute distress. He is eating well.   . Patient reporting he is taking his medications as directed . Continues to smoke, with no interest in quitting- 09-05-2019: Is smoking about 5 cigarettes a day. Will continue to work on cutting back . Continues to be on 02 . Evaluation of current status of Laryngeal cancer and treatment options.  09-05-2019: The patient has completed his chemotherapy. He feels he is doing well.  . The patient has completed his chemotherapy regimen and is now taking radiation. The patient is feeling much better. 09-05-2019: The patient has an upcoming appointment in [facility] to have teeth pulled. 09-19-2019.  The patient has top dentures but wants to have bottom teeth removed so he can get bottom dentures and be able to eat a regular diet again.  . Evaluation of anxiety.  The patient states he is feeling better each day. He wants to keep doing things he is enjoying and stay safe. He has had some "cold-like" symptoms since getting the first COVID vaccine but denies any acute distress.  . Evaluation of smoking. The patient verbalized he is down to 5 cigarettes a day.  The patient admits that is not enough but it is better than what it was. Praised the patient for small accomplishments. The patient was happier  on the call today and thanked the [facility] for ongoing support and help.  . Reports that he has had his first COVID vaccine and gets his second one on 09-13-2019.  [name] Evaluation of upcoming appointments. Next appointment with pcp on 09-18-2019 at [place] with pcp.    Patient Self Care Activities:  Takes medications as prescribed including inhalers  Utilizes infection prevention strategies to reduce risk of respiratory infection   Does not currently perform self care activities related to COPD management  Please see past updates related to this goal by clicking on the "Past Updates" button in the selected goal      .  COMPLETED: RN: I have some big decisions to make        Current Barriers: Closing this goal currently as the patient has finished his treatments and is doing well at this time.  . Film/video editor.  . Chronic Disease  Management support and education needs related to Chronic health conditions impacting the patients care, currently having to decide about treatment options for Laryngeal cancer and the patients concern of "quality of life vs quantity of life" . No Advanced Directives in place  Nurse Case Manager Clinical Goal(s):  [name] Over the next 120 days, patient will work with pcp and CCM team to address needs related to prognosis of patient with chronic medical conditions/terminal illness diagnosis . Over the next 120 days, patient will attend all scheduled medical appointments: patient has been actively taking chemotherapy but has decided to take a week off. . Over the next 120 days, patient will demonstrate improved health management independence as evidenced byremaining independent with his care and ability to make decisions about his health and well being independently . Over the next 90 days, patient will work with CM team pharmacist to manage medications . Over the next 90 days, patient will work with CM clinical social worker to decrease stress and anxiety and assist as  needed with financial needs . Over the next 90 days, the patient will work with the care management team towards completion of advanced directives  Interventions:  . Evaluation of current treatment plan related to chronic medical conditions/terminal illness diagnosis and patient's adherence to plan as established by provider. The patient has completed his chemotherapy at this time. He is still taking radiation. They have not been able to do a PET scan per the patient.  The patient states he is feeling much better.  . Provided education to patient and/or caregiver about advanced directives . Provided education to patient re: giving the patient samples of Ensure to try to see if this is something that will help with his nutritional health and goals.  The patient states he has sores in his mouth and this is preventing him from eating or drinking much of anything.  The sores are an ongoing issue but he states today they are better. The patient was having a lot of nausea but he is now taking the "nausea" medication and it has been very helpful for him.  He is still having to take 80% of his nutrition by his peg tube but he is feeling much better and states the nausea medication has helped him so much.  . Reviewed medications with patient and discussed compliance . Collaborated with CCM team regarding ongoing support and needs related to complex chronic medical conditions and terminal illness diagnosis.  The patient is feeling much better and states he is even getting out with his fiance and they are enjoying things.  . Discussed plans with patient for ongoing care management follow up and provided patient with direct contact information for care management team . Reviewed scheduled/upcoming provider appointments including: saw pcp on 06/05/2019. Next appointment with pcp on 08/31/2019 . Social Work referral for continued support for emotional and financial needs . Pharmacy referral for continued medication  management and support  Patient Self Care Activities:  . Patient verbalizes understanding of plan to assist in management of chronic medical conditions and terminal illness diagnosis . Self administers medications as prescribed . Attends all scheduled provider appointments . Calls provider office for new concerns or questions . Unable to independently decide the best course of action for his chronic medical condition and terminal illness diagnosis   Please see past updates related to this goal by clicking on the "Past Updates" button in the selected goal          Plan:  Telephone follow up appointment with care management team member scheduled for:10-31-2019 at 230pm   [place], MSN, [facility] Mobile: [phone]

## 2019-09-06 DIAGNOSIS — R490 Dysphonia: Secondary | ICD-10-CM | POA: Diagnosis not present

## 2019-09-06 DIAGNOSIS — F17200 Nicotine dependence, unspecified, uncomplicated: Secondary | ICD-10-CM | POA: Diagnosis not present

## 2019-09-06 DIAGNOSIS — Z8521 Personal history of malignant neoplasm of larynx: Secondary | ICD-10-CM | POA: Diagnosis not present

## 2019-09-07 ENCOUNTER — Telehealth: Payer: Self-pay | Admitting: Adult Health Nurse Practitioner

## 2019-09-07 NOTE — Telephone Encounter (Signed)
Spoke with patient and scheduled visit for 10/09/19 at 10am. Kevin Witham K. Olena Heckle NP

## 2019-09-10 ENCOUNTER — Other Ambulatory Visit: Payer: Self-pay | Admitting: *Deleted

## 2019-09-10 DIAGNOSIS — C329 Malignant neoplasm of larynx, unspecified: Secondary | ICD-10-CM

## 2019-09-11 MED ORDER — OXYCODONE HCL 10 MG PO TABS: 10.0000 mg | ORAL_TABLET | ORAL | 0 refills | Status: DC | PRN

## 2019-09-13 DIAGNOSIS — Z23 Encounter for immunization: Secondary | ICD-10-CM | POA: Diagnosis not present

## 2019-09-18 ENCOUNTER — Encounter: Payer: Self-pay | Admitting: Nurse Practitioner

## 2019-09-18 ENCOUNTER — Ambulatory Visit (INDEPENDENT_AMBULATORY_CARE_PROVIDER_SITE_OTHER): Payer: Medicare Other | Admitting: Nurse Practitioner

## 2019-09-18 ENCOUNTER — Other Ambulatory Visit: Payer: Self-pay

## 2019-09-18 VITALS — BP 108/65 | HR 93 | Temp 98.3°F | Wt 95.4 lb

## 2019-09-18 DIAGNOSIS — Z85828 Personal history of other malignant neoplasm of skin: Secondary | ICD-10-CM | POA: Diagnosis not present

## 2019-09-18 DIAGNOSIS — D692 Other nonthrombocytopenic purpura: Secondary | ICD-10-CM | POA: Diagnosis not present

## 2019-09-18 DIAGNOSIS — E43 Unspecified severe protein-calorie malnutrition: Secondary | ICD-10-CM

## 2019-09-18 DIAGNOSIS — Z79899 Other long term (current) drug therapy: Secondary | ICD-10-CM | POA: Diagnosis not present

## 2019-09-18 DIAGNOSIS — F411 Generalized anxiety disorder: Secondary | ICD-10-CM

## 2019-09-18 DIAGNOSIS — F17210 Nicotine dependence, cigarettes, uncomplicated: Secondary | ICD-10-CM

## 2019-09-18 DIAGNOSIS — J449 Chronic obstructive pulmonary disease, unspecified: Secondary | ICD-10-CM

## 2019-09-18 DIAGNOSIS — I2510 Atherosclerotic heart disease of native coronary artery without angina pectoris: Secondary | ICD-10-CM

## 2019-09-18 DIAGNOSIS — C329 Malignant neoplasm of larynx, unspecified: Secondary | ICD-10-CM

## 2019-09-18 NOTE — Progress Notes (Signed)
BP 108/65    Pulse 93    Temp 98.3 F (36.8 C) (Oral)    Wt 95 lb 6 oz (43.3 kg)    SpO2 92%    BMI 15.39 kg/m    Subjective:    Patient ID: Kevin Nelson, male    DOB: 08/10/1950, 69 y.o.   MRN: [id number]  HPI: Kevin Nelson is a 69 y.o. male  Chief Complaint  Patient presents with   Anxiety   COPD   COPD Followed by pulmonary, last saw at [facility] on 02/19/2019.  Continues Combivent, Stiolto, and Duoneb.  Has diagnosis of laryngeal cancer, being followed by oncology and palliative team and receiving treatments with last infusion on 05/31/19.  Had a feeding tube in place, which was discontinued on 08/31/19.  Goes to dental at [facility] tomorrow, as this is biggest issue in regard to eating meals.  Needs referral to [facility] dermatology today for his history of basal cell carcinoma diagnosis in past, having multiple removed.  Reports needing a skin exam, as is past due. COPD status: stable Satisfied with current treatment?: yes Oxygen use: yes Dyspnea frequency: occasional Cough frequency: chronic, no worsening Rescue inhaler frequency:  3-4 times a week, sometimes more Limitation of activity: at times Productive cough: none Last Spirometry: with pulmonary Pneumovax: refused -- Covid vaccine recently Influenza: Up To Date  ANXIETY/STRESS Followed by palliative team at this time due to co morbidities and laryngeal CA.  Is taking Klonopin 1 MG BID PRN and Celexa 20 MG QDAY, last fill on 09/15/19 on PDMP review -- taking one a day Klonopin at this time.  He reports benefit from this regimen.  Risks, benefits, side effects and alternative therapies were discussed.  The opportunity to ask questions was provided and they were answered to the best of my ability.  The patient expressed understanding and willingness to follow the outlined treatment protocols and was able to verbalize plan of care back to provider.  He also takes Oxycodone 10 MG as needed, prescribed by palliative NP with oncology -- filled  8/24/21and Oxycontin filled last 07/31/19.   Duration:stable Anxious mood: yes  Excessive worrying: yes Irritability: no  Sweating: no Nausea: no Palpitations:no Hyperventilation: no Panic attacks: no Agoraphobia: no  Obscessions/compulsions: no Depressed mood: yes Depression screen [facility] 2/9 09/18/2019 06/05/2019 03/05/2019 01/10/2019 07/31/2018  Decreased Interest 0 1 3 0 3  Down, Depressed, Hopeless 0 1 3 1 3   PHQ - 2 Score 0 2 6 1 6   Altered sleeping 0 3 3 - 3  Tired, decreased energy 1 1 3  - 3  Change in appetite 2 3 1  - 3  Feeling bad or failure about yourself  0 0 0 - 0  Trouble concentrating 0 1 0 - 1  Moving slowly or fidgety/restless 0 2 0 - 3  Suicidal thoughts 0 0 1 - 0  PHQ-9 Score 3 12 14  - 19  Difficult doing work/chores Not difficult at all Not difficult at all Somewhat difficult - Somewhat difficult  Some recent data might be hidden   Anhedonia: no Weight changes: no Insomnia: yes hard to fall asleep  Hypersomnia: no Fatigue/loss of energy: no Feelings of worthlessness: no Feelings of guilt: no Impaired concentration/indecisiveness: yes Suicidal ideations: no  Crying spells: no Recent Stressors/Life Changes: no   Relationship problems: no   Family stress: no     Financial stress: no    Job stress: no    Recent death/loss: no GAD 7 : Generalized Anxiety Score  09/18/2019 06/05/2019 03/05/2019 07/31/2018  Nervous, Anxious, on Edge 1 3 3 3   Control/stop worrying 0 2 3 3   Worry too much - different things 1 2 3 3   Trouble relaxing 0 2 0 3  Restless 0 2 0 1  Easily annoyed or irritable 1 2 1 3   Afraid - awful might happen 1 2 1 3   Total GAD 7 Score 4 15 11 19   Anxiety Difficulty Not difficult at all Somewhat difficult Somewhat difficult Somewhat difficult    Relevant past medical, surgical, family and social history reviewed and updated as indicated. Interim medical history since our last visit reviewed. Allergies and medications reviewed and  updated.  Review of Systems  Constitutional: Negative for activity change, diaphoresis, fatigue and fever.  Respiratory: Positive for cough (baseline) and wheezing (baseline). Negative for chest tightness and shortness of breath.   Cardiovascular: Negative for chest pain, palpitations and leg swelling.  Gastrointestinal: Negative.   Psychiatric/Behavioral: Negative.     Per HPI unless specifically indicated above     Objective:    BP 108/65    Pulse 93    Temp 98.3 F (36.8 C) (Oral)    Wt 95 lb 6 oz (43.3 kg)    SpO2 92%    BMI 15.39 kg/m   Wt Readings from Last 3 Encounters:  09/18/19 95 lb 6 oz (43.3 kg)  07/20/19 101 lb (45.8 kg)  07/11/19 100 lb 1.6 oz (45.4 kg)    Physical Exam Vitals and nursing note reviewed.  Constitutional:      General: He is awake. He is not in acute distress.    Appearance: He is well-developed and well-groomed. He is       underweight. He is not ill-appearing.     Comments: Frail appearing.  HENT:     Head: Normocephalic and atraumatic.     Right Ear: Hearing normal. No drainage.     Left Ear: Hearing normal. No drainage.  Eyes:     General: Lids are normal.        Right eye: No discharge.        Left eye: No discharge.     Conjunctiva/sclera: Conjunctivae normal.     Pupils: Pupils are equal, round, and reactive to light.  Neck:     Vascular: No carotid bruit.  Cardiovascular:     Rate and Rhythm: Normal rate and regular rhythm.     Heart sounds: Normal heart sounds, S1 normal and S2 normal. No murmur. No gallop.   Pulmonary:     Effort: Pulmonary effort is normal. No accessory muscle usage or respiratory distress.     Breath sounds: Decreased breath sounds present.     Comments: Decreased breath sounds throughout, clear.  O2 on at 2L [place]. Abdominal:     General: Bowel sounds are normal.     Palpations: Abdomen is soft.  Musculoskeletal:        General: Normal range of motion.     Cervical back: Normal range of motion and neck  supple.     Right lower leg: No edema.     Left lower leg: No edema.  Skin:    General: Skin is warm and dry.     Comments: Scattered small pale purple bruises bilateral upper extremities.  Neurological:     Mental Status: He is alert and oriented to person, place, and time.     Deep Tendon Reflexes: Reflexes are normal and symmetric.  Psychiatric:  Attention and Perception: Attention normal.        Mood and Affect: Mood normal.        Speech: Speech normal.        Behavior: Behavior normal. Behavior is cooperative.        Thought Content: Thought content normal.    Results for orders placed or performed in visit on 08/20/19  Phosphorus  Result Value Ref Range   Phosphorus 3.9 2.5 - 4.6 mg/dL  Magnesium  Result Value Ref Range   Magnesium 1.8 1.7 - 2.4 mg/dL  Comprehensive metabolic panel  Result Value Ref Range   Sodium 133 (L) 135 - 145 mmol/L   Potassium 4.2 3.5 - 5.1 mmol/L   Chloride 94 (L) 98 - 111 mmol/L   CO2 34 (H) 22 - 32 mmol/L   Glucose, Bld 128 (H) 70 - 99 mg/dL   BUN 16 8 - 23 mg/dL   Creatinine, Ser 0.41 (L) 0.61 - 1.24 mg/dL   Calcium 8.1 (L) 8.9 - 10.3 mg/dL   Total Protein 6.7 6.5 - 8.1 g/dL   Albumin 3.7 3.5 - 5.0 g/dL   AST 18 15 - 41 U/L   ALT 11 0 - 44 U/L   Alkaline Phosphatase 56 38 - 126 U/L   Total Bilirubin 0.6 0.3 - 1.2 mg/dL   GFR calc non Af Amer >60 >60 mL/min   GFR calc Af Amer >60 >60 mL/min   Anion gap 5 5 - 15  CBC with Differential/Platelet  Result Value Ref Range   WBC 6.8 4.0 - 10.5 K/uL   RBC 3.46 (L) 4.22 - 5.81 MIL/uL   Hemoglobin 9.8 (L) 13.0 - 17.0 g/dL   HCT 30.0 (L) 39 - 52 %   MCV 86.7 80.0 - 100.0 fL   MCH 28.3 26.0 - 34.0 pg   MCHC 32.7 30.0 - 36.0 g/dL   RDW 15.7 (H) 11.5 - 15.5 %   Platelets 247 150 - 400 K/uL   nRBC 0.0 0.0 - 0.2 %   Neutrophils Relative % 65 %   Neutro Abs 4.4 1.7 - 7.7 K/uL   Lymphocytes Relative 18 %   Lymphs Abs 1.2 0.7 - 4.0 K/uL   Monocytes Relative 13 %   Monocytes Absolute 0.9  0 - 1 K/uL   Eosinophils Relative 3 %   Eosinophils Absolute 0.2 0 - 0 K/uL   Basophils Relative 1 %   Basophils Absolute 0.1 0 - 0 K/uL   Immature Granulocytes 0 %   Abs Immature Granulocytes 0.03 0.00 - 0.07 K/uL      Assessment & Plan:   Problem List Items Addressed This Visit      Cardiovascular and Mediastinum   Senile purpura (HCC)    In presence of severe COPD and protein calorie malnutrition.  Discussed proper skin care, with gentle cleansing and use of Lubriderm lotion daily.  Monitor for skin breakdown and alert provider if present.        Respiratory   COPD, very severe ([place]) - Primary    Chronic, ongoing with O2 dependence.  Continue current medication regimen and collaboration with pulmonology.  Recommend complete smoking cessation, although not interested in this at this time.      Squamous cell carcinoma of larynx (HCC)    Ongoing.  Continue collaboration with oncology and appreciate their input.  Recent notes reviewed.        Musculoskeletal and Integument   History of basal cell carcinoma (BCC)  Referral placed to dermatology per request.      Relevant Orders   Ambulatory referral to Dermatology     Other   Generalized anxiety disorder    Chronic, ongoing.  Denies SI/HI.  Will continue current palliative regimen.  Patient aware of long term risks of benzos, wishes to continue at this time.  Recent refills sent.  Continue to collaborate with palliative team.  UDS today.  Return in 3 months.      Relevant Orders   [id number] 11+Oxyco+Alc+Crt-Bund   Nicotine dependence, cigarettes, uncomplicated    I have recommended complete cessation of tobacco use. I have discussed various options available for assistance with tobacco cessation including over the counter methods (Nicotine gum, patch and lozenges). We also discussed prescription options (Chantix, Nicotine Inhaler / Nasal Spray). The patient is not interested in pursuing any prescription tobacco cessation  options at this time.       Protein-calorie malnutrition ([place])    Ongoing with cancer treatments.  Continue collaboration with oncology and palliative.  Recommend drinking supplements TID.          Follow up plan: Return in about 3 months (around 12/18/2019) for COPD and MOOD.

## 2019-09-18 NOTE — Assessment & Plan Note (Signed)
Chronic, ongoing.  Denies SI/HI.  Will continue current palliative regimen.  Patient aware of long term risks of benzos, wishes to continue at this time.  Recent refills sent.  Continue to collaborate with palliative team.  UDS today.  Return in 3 months.

## 2019-09-18 NOTE — Assessment & Plan Note (Signed)
Referral placed to dermatology per request

## 2019-09-18 NOTE — Assessment & Plan Note (Signed)
I have recommended complete cessation of tobacco use. I have discussed various options available for assistance with tobacco cessation including over the counter methods (Nicotine gum, patch and lozenges). We also discussed prescription options (Chantix, Nicotine Inhaler / Nasal Spray). The patient is not interested in pursuing any prescription tobacco cessation options at this time.  

## 2019-09-18 NOTE — Assessment & Plan Note (Signed)
In presence of severe COPD and protein calorie malnutrition.  Discussed proper skin care, with gentle cleansing and use of Lubriderm lotion daily.  Monitor for skin breakdown and alert provider if present. 

## 2019-09-18 NOTE — Patient Instructions (Signed)
Chronic Obstructive Pulmonary Disease Chronic obstructive pulmonary disease (COPD) is a long-term (chronic) lung problem. When you have COPD, it is hard for air to get in and out of your lungs. Usually the condition gets worse over time, and your lungs will never return to normal. There are things you can do to keep yourself as healthy as possible.  Your doctor may treat your condition with: ? Medicines. ? Oxygen. ? Lung surgery.  Your doctor may also recommend: ? Rehabilitation. This includes steps to make your body work better. It may involve a team of specialists. ? Quitting smoking, if you smoke. ? Exercise and changes to your diet. ? Comfort measures (palliative care). Follow these instructions at home: Medicines  Take over-the-counter and prescription medicines only as told by your doctor.  Talk to your doctor before taking any cough or allergy medicines. You may need to avoid medicines that cause your lungs to be dry. Lifestyle  If you smoke, stop. Smoking makes the problem worse. If you need help quitting, ask your doctor.  Avoid being around things that make your breathing worse. This may include smoke, chemicals, and fumes.  Stay active, but remember to rest as well.  Learn and use tips on how to relax.  Make sure you get enough sleep. Most adults need at least 7 hours of sleep every night.  Eat healthy foods. Eat smaller meals more often. Rest before meals. Controlled breathing Learn and use tips on how to control your breathing as told by your doctor. Try:  Breathing in (inhaling) through your nose for 1 second. Then, pucker your lips and breath out (exhale) through your lips for 2 seconds.  Putting one hand on your belly (abdomen). Breathe in slowly through your nose for 1 second. Your hand on your belly should move out. Pucker your lips and breathe out slowly through your lips. Your hand on your belly should move in as you breathe out.  Controlled coughing Learn  and use controlled coughing to clear mucus from your lungs. Follow these steps: 1. Lean your head a little forward. 2. Breathe in deeply. 3. Try to hold your breath for 3 seconds. 4. Keep your mouth slightly open while coughing 2 times. 5. Spit any mucus out into a tissue. 6. Rest and do the steps again 1 or 2 times as needed. General instructions  Make sure you get all the shots (vaccines) that your doctor recommends. Ask your doctor about a flu shot and a pneumonia shot.  Use oxygen therapy and pulmonary rehabilitation if told by your doctor. If you need home oxygen therapy, ask your doctor if you should buy a tool to measure your oxygen level (oximeter).  Make a COPD action plan with your doctor. This helps you to know what to do if you feel worse than usual.  Manage any other conditions you have as told by your doctor.  Avoid going outside when it is very hot, cold, or humid.  Avoid people who have a sickness you can catch (contagious).  Keep all follow-up visits as told by your doctor. This is important. Contact a doctor if:  You cough up more mucus than usual.  There is a change in the color or thickness of the mucus.  It is harder to breathe than usual.  Your breathing is faster than usual.  You have trouble sleeping.  You need to use your medicines more often than usual.  You have trouble doing your normal activities such as getting dressed   or walking around the house. Get help right away if:  You have shortness of breath while resting.  You have shortness of breath that stops you from: ? Being able to talk. ? Doing normal activities.  Your chest hurts for longer than 5 minutes.  Your skin color is more blue than usual.  Your pulse oximeter shows that you have low oxygen for longer than 5 minutes.  You have a fever.  You feel too tired to breathe normally. Summary  Chronic obstructive pulmonary disease (COPD) is a long-term lung problem.  The way your  lungs work will never return to normal. Usually the condition gets worse over time. There are things you can do to keep yourself as healthy as possible.  Take over-the-counter and prescription medicines only as told by your doctor.  If you smoke, stop. Smoking makes the problem worse. This information is not intended to replace advice given to you by your health care provider. Make sure you discuss any questions you have with your health care provider. Document Revised: 12/17/2016 Document Reviewed: 02/09/2016 Elsevier Patient Education  2020 [employer]vier Inc.  

## 2019-09-18 NOTE — Assessment & Plan Note (Signed)
Ongoing.  Continue collaboration with oncology and appreciate their input.  Recent notes reviewed. 

## 2019-09-18 NOTE — Assessment & Plan Note (Signed)
Ongoing with cancer treatments.  Continue collaboration with oncology and palliative.  Recommend drinking supplements TID.

## 2019-09-18 NOTE — Assessment & Plan Note (Signed)
Chronic, ongoing with O2 dependence.  Continue current medication regimen and collaboration with pulmonology.  Recommend complete smoking cessation, although not interested in this at this time.

## 2019-09-20 ENCOUNTER — Telehealth: Payer: Self-pay | Admitting: *Deleted

## 2019-09-20 ENCOUNTER — Inpatient Hospital Stay: Payer: Medicare Other

## 2019-09-20 ENCOUNTER — Encounter: Payer: Self-pay | Admitting: Oncology

## 2019-09-20 ENCOUNTER — Other Ambulatory Visit: Payer: Self-pay

## 2019-09-20 ENCOUNTER — Inpatient Hospital Stay: Payer: Medicare Other | Attending: Oncology | Admitting: Oncology

## 2019-09-20 ENCOUNTER — Other Ambulatory Visit: Payer: Self-pay | Admitting: *Deleted

## 2019-09-20 VITALS — BP 108/58 | HR 66 | Temp 97.8°F | Resp 16 | Wt 97.1 lb

## 2019-09-20 DIAGNOSIS — F1190 Opioid use, unspecified, uncomplicated: Secondary | ICD-10-CM | POA: Diagnosis not present

## 2019-09-20 DIAGNOSIS — I2510 Atherosclerotic heart disease of native coronary artery without angina pectoris: Secondary | ICD-10-CM

## 2019-09-20 DIAGNOSIS — Z08 Encounter for follow-up examination after completed treatment for malignant neoplasm: Secondary | ICD-10-CM

## 2019-09-20 DIAGNOSIS — Z8589 Personal history of malignant neoplasm of other organs and systems: Secondary | ICD-10-CM | POA: Diagnosis not present

## 2019-09-20 DIAGNOSIS — D649 Anemia, unspecified: Secondary | ICD-10-CM | POA: Diagnosis not present

## 2019-09-20 DIAGNOSIS — E538 Deficiency of other specified B group vitamins: Secondary | ICD-10-CM

## 2019-09-20 DIAGNOSIS — E43 Unspecified severe protein-calorie malnutrition: Secondary | ICD-10-CM

## 2019-09-20 DIAGNOSIS — Z95828 Presence of other vascular implants and grafts: Secondary | ICD-10-CM

## 2019-09-20 DIAGNOSIS — C329 Malignant neoplasm of larynx, unspecified: Secondary | ICD-10-CM

## 2019-09-20 MED ORDER — OXYCODONE HCL ER 10 MG PO T12A: 10.0000 mg | EXTENDED_RELEASE_TABLET | Freq: Two times a day (BID) | ORAL | 0 refills | Status: DC

## 2019-09-20 MED ORDER — OXYCODONE HCL 5 MG PO TABS
5.0000 mg | ORAL_TABLET | Freq: Three times a day (TID) | ORAL | 0 refills | Status: DC | PRN
Start: 2019-09-20 — End: 2019-10-18

## 2019-09-20 MED ORDER — CYANOCOBALAMIN 1000 MCG/ML IJ SOLN
1000.0000 ug | Freq: Once | INTRAMUSCULAR | Status: AC
  Administered 2019-09-20: 1000 ug via INTRAMUSCULAR
  Filled 2019-09-20: qty 1

## 2019-09-20 NOTE — H&P (View-Only) (Signed)
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 Richardson Landry with symptoms of difficulty swallowing and persistent sore throat which has been ongoing for the last 1 year.NPL exam showed afriable mass involving the false vocal cord but did not involve the true vocal cords. True vocal cords remain mobile on exam. Hypopharynx and tongue base was clear.   This was followed by CT soft tissue neck which showed a 3.3 x 1.7 x 3.5 cm mass which was thought to be involving both the true and false vocal cords and extending anteriorly to involve the anterior commissure. Mass extends to involve the paraglottic space and erodes through the thyroid cartilage anteriorly and left of midline. There is also invasion of the preepiglottic space inferiorly. Enlarged cystic necrotic level 3/4 lymph node measuring 1.6 x 1.2 cm. Findings are consistent with nodal metastatic disease. Biopsy of the mass showed squamous cell carcinoma.  PET/CT showed hyppermetabolic mass extending from left glottis to anterior commisure. Solitary left level 2 metastatic Lymph node. No distant metastatic disease  Patient met with [facility]NT and decided against total laryngectomy. Plan is for concurrent chemo/RT  +/-adjuvantchemotherapy following chemo/RT.patient seen 5 cycles of weekly cisplatin chemotherapy with the last dose which was given on 04/30/2019.  Repeat PET scan on 07/19/2019 showed complete resolution of hypermetabolic activity associated with larynx along with resolution at level 3 lymph node.  No evidence of recurrent or residual disease.  Interval history- peg tube is out. Weight has remaiend stable but not better. He has poor dentition which makes it harder for him to swallow but he does not have significant pain on swallowing.  He is stilll using oxycontin and prn oxycodone but mainly for his toothache. He is seeing dentist for this  ECOG PS- 2 Pain scale- 5 Opioid associated constipation- no  Review of systems- Review of Systems  Constitutional: Positive for malaise/fatigue. Negative for chills, fever and weight loss.  HENT: Negative for congestion, ear discharge and nosebleeds.        Poor dentition  Eyes: Negative for blurred vision.  Respiratory: Negative for cough, hemoptysis, sputum production, shortness of breath and wheezing.   Cardiovascular: Negative for chest pain, palpitations, orthopnea and claudication.  Gastrointestinal: Negative for abdominal pain, blood in stool, constipation, diarrhea, heartburn, melena, nausea and vomiting.  Genitourinary: Negative for dysuria, flank pain, frequency, hematuria and urgency.  Musculoskeletal: Negative for back pain, joint pain and myalgias.  Skin: Negative for rash.  Neurological: Negative for dizziness, tingling, focal weakness, seizures, weakness and headaches.  Endo/Heme/Allergies: Does not bruise/bleed easily.  Psychiatric/Behavioral: Negative for depression and suicidal ideas. The patient does not have insomnia.        No Known Allergies   Past Medical History:  Diagnosis Date  . Anxiety   . Basal cell carcinoma   . Cigarette smoker   . Dental decay   .  Dysphagia   . Dyspnea   . End stage COPD ([place])    end stage  copd, emphysema  . Erectile dysfunction   . Headache    every morning  . Hepatitis C    treated for 8 weeks this past year with Mayvret  . History of nonmelanoma skin cancer   . Hoarseness, chronic   . Myocardial infarction [facility]) march/april 2020   mild heart attack  . Prostate cancer ([place])   . Respiratory failure, acute ([place])    was inpt for 5 days. thought he was going to die.  . Sleep apnea   . Squamous cell skin cancer   . Throat cancer ([place])   . Use of leuprolide acetate (Lupron)    treated for prostate cancer.   . 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     Past Surgical History:  Procedure Laterality Date  . FACIAL LACERATION REPAIR    . HERNIA REPAIR Left 1992   inguinal  . IR GASTROSTOMY TUBE MOD SED  04/26/2019  . MICROLARYNGOSCOPY N/A 01/17/2019   Procedure: MICROLARYNGOSCOPY WITH BX;  Surgeon: Clyde Canterbury, MD;  Location: [facility] ORS;  Service: ENT;  Laterality: N/A;  . PORTA CATH INSERTION N/A 03/15/2019   Procedure: PORTA CATH INSERTION;  Surgeon: Algernon Huxley, MD;  Location: [place] CV LAB;  Service: Cardiovascular;  Laterality: N/A;  . PROSTATE BIOPSY    . SKIN CANCER EXCISION    . TRIGGER FINGER RELEASE Left 10/2018    Social History   Socioeconomic History  . Marital status: Significant Other    Spouse name: [name], girlfriend  . Number of children: Not on file  . Years of education: Not on file  . Highest education level: Not on file  Occupational History  . Occupation: Chief Strategy Officer    Comment: retired  Tobacco Use  . Smoking status: Current Every Day Smoker    Packs/day: 0.25    Years: 52.00    Pack years: 13.00    Types: Cigarettes  . Smokeless tobacco: Never Used  . Tobacco comment: 5 cigarettes a day  Vaping Use  . Vaping Use: Never used  Substance and Sexual Activity  . Alcohol use: Not Currently    Alcohol/week: 0.0 standard drinks  . Drug use: Never  . Sexual activity: Yes  Other Topics Concern  . Not on file    Social History Narrative   Patient has retired d/t respiratory status. Currently lives with girlfriend    Social Determinants of Health   Financial Resource Strain: Low Risk   . Difficulty of Paying Living Expenses: Not hard at all  Food Insecurity: No Food Insecurity  . Worried About Charity fundraiser in the Last Year: Never true  . Ran Out of Food in the Last Year: Never true  Transportation Needs: No Transportation Needs  . Lack of Transportation (Medical): No  . Lack of Transportation (Non-Medical): No  Physical Activity: Inactive  . Days of Exercise per Week: 0 days  . Minutes of Exercise per Session: 0 min  Stress: Stress Concern Present  . Feeling of Stress : Very much  Social Connections:   . Frequency of Communication with Friends and Family: Not on file  . Frequency of Social Gatherings with Friends and Family: Not on file  . Attends Religious Services: Not on file  . Active Member of Clubs or Organizations: Not on file  . Attends Archivist Meetings: Not on file  .  Marital Status: Not on file  Intimate Partner Violence:   . Fear of Current or Ex-Partner: Not on file  . Emotionally Abused: Not on file  . Physically Abused: Not on file  . Sexually Abused: Not on file    Family History  Problem Relation Age of Onset  . Cancer Sister   . Cancer - Cervical Mother   . Heart attack Brother   . Heart attack Maternal Grandfather      Current Outpatient Medications:  .  citalopram (CELEXA) 20 MG tablet, TAKE 1 TABLET(20 MG) BY MOUTH DAILY, Disp: 30 tablet, Rfl: 2 .  clonazePAM (KLONOPIN) 1 MG tablet, TAKE 1 TABLET(1 MG) BY MOUTH TWICE DAILY AS NEEDED FOR ANXIETY, Disp: 60 tablet, Rfl: 2 .  COMBIVENT RESPIMAT 20-100 MCG/ACT AERS respimat, INHALE 1 PUFF BY MOUTH INTO THE LUNGS EVERY 6 HOURS AS NEEDED, Disp: 4 g, Rfl: 0 .  fluticasone (FLONASE) 50 MCG/ACT nasal spray, SHAKE LIQUID AND USE 1 SPRAY IN EACH NOSTRIL TWICE DAILY, Disp: 16 g, Rfl: 1 .   ipratropium-albuterol (DUONEB) 0.5-2.5 (3) MG/3ML SOLN, Take 3 mLs by nebulization every 6 (six) hours as needed., Disp: 360 mL, Rfl: 11 .  naloxegol oxalate (MOVANTIK) 25 MG TABS tablet, Take 1 tablet (25 mg total) by mouth daily., Disp: 30 tablet, Rfl: 0 .  oxyCODONE (OXYCONTIN) 10 mg 12 hr tablet, Take 1 tablet (10 mg total) by mouth every 12 (twelve) hours., Disp: 60 tablet, Rfl: 0 .  Oxycodone HCl 10 MG TABS, Take 1 tablet (10 mg total) by mouth every 4 (four) hours as needed., Disp: 90 tablet, Rfl: 0 .  OXYGEN, Inhale 3 L into the lungs daily., Disp: , Rfl:  .  STIOLTO RESPIMAT 2.5-2.5 MCG/ACT AERS, INHALE 2 PUFFS INTO THE LUNGS DAILY, Disp: 4 g, Rfl: 5 .  prochlorperazine (COMPAZINE) 10 MG tablet, TAKE 1 TABLET(10 MG) BY MOUTH EVERY 6 HOURS AS NEEDED FOR NAUSEA OR VOMITING (Patient not taking: Reported on 09/18/2019), Disp: 30 tablet, Rfl: 1  Physical exam:  Vitals:   09/20/19 1040  BP: (!) 108/58  Pulse: 66  Resp: 16  Temp: 97.8 F (36.6 C)  TempSrc: Tympanic  SpO2: 98%  Weight: 97 lb 1.6 oz (44 kg)   Physical Exam Constitutional:      Comments: Thin and cachectic. On chronic home O2  HENT:     Head: Normocephalic and atraumatic.  Eyes:     Pupils: Pupils are equal, round, and reactive to light.  Cardiovascular:     Rate and Rhythm: Normal rate and regular rhythm.     Heart sounds: Normal heart sounds.  Pulmonary:     Effort: Pulmonary effort is normal.     Breath sounds: Normal breath sounds.  Abdominal:     General: Bowel sounds are normal.     Palpations: Abdomen is soft.  Musculoskeletal:     Cervical back: Normal range of motion.  Lymphadenopathy:     Comments: No palpable cervical adenopathy  Skin:    General: Skin is warm and dry.  Neurological:     Mental Status: He is alert and oriented to person, place, and time.      CMP Latest Ref Rng & Units 08/20/2019  Glucose 70 - 99 mg/dL 128(H)  BUN 8 - 23 mg/dL 16  Creatinine 0.61 - 1.24 mg/dL 0.41(L)  Sodium  135 - 145 mmol/L 133(L)  Potassium 3.5 - 5.1 mmol/L 4.2  Chloride 98 - 111 mmol/L 94(L)  CO2 22 - 32  mmol/L 34(H)  Calcium 8.9 - 10.3 mg/dL 8.1(L)  Total Protein 6.5 - 8.1 g/dL 6.7  Total Bilirubin 0.3 - 1.2 mg/dL 0.6  Alkaline Phos 38 - 126 U/L 56  AST 15 - 41 U/L 18  ALT 0 - 44 U/L 11   CBC Latest Ref Rng & Units 08/20/2019  WBC 4.0 - 10.5 K/uL 6.8  Hemoglobin 13.0 - 17.0 g/dL 9.8(L)  Hematocrit 39 - 52 % 30.0(L)  Platelets 150 - 400 K/uL 247    Assessment and plan- Patient is a 69 y.o. male WithSquamous cell carcinoma of the supraglottic larynx stage IVAcT4 acN1 cM0status post 5 weekly cycles of cisplatin and concurrent RT. This is a routine f/u visit  Clinically patient is doing well. He is also seeing Dr. Richardson Landry for ent exams. Will consider repeating pet in jan 2022. Port to be taken out at this time  b12 deficiency- continue monthly b12 and will repeat labs in 3 months  Severe protein calorie malnutrition- peg tube is out. He is not having difficulty swallowing food but his poor dentition makes it harder.I have encouraged him to drink daily ensure  Dental pain- My plan Is to wean him off pain meds in 3 months. I have asked him to start going down on his dose. I do not plan to renew his pain meds beyond 3 months. I am reducing prn oxycodone dose to 5 mg Q8 prn and keeping oxycontin as is. Will not renew oxycontin after 1 month  Normocytic anemia: Likely secondary to chronic disease.  Mildly improving.  Repeat CBC ferritin and iron studies in 3 months   Visit Diagnosis 1. Encounter for follow-up surveillance of head and neck cancer   2. Severe protein-calorie malnutrition ([place])   3. Opioid use      Dr. Randa Evens, MD, MPH [facility] [place] [phone] 09/20/2019 1:13 PM

## 2019-09-20 NOTE — Progress Notes (Signed)
   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 Bennett with symptoms of difficulty swallowing and persistent sore throat which has been ongoing for the last 1 year.NPL exam showed afriable mass involving the false vocal cord but did not involve the true vocal cords. True vocal cords remain mobile on exam. Hypopharynx and tongue base was clear.   This was followed by CT soft tissue neck which showed a 3.3 x 1.7 x 3.5 cm mass which was thought to be involving both the true and false vocal cords and extending anteriorly to involve the anterior commissure. Mass extends to involve the paraglottic space and erodes through the thyroid cartilage anteriorly and left of midline. There is also invasion of the preepiglottic space inferiorly. Enlarged cystic necrotic level 3/4 lymph node measuring 1.6 x 1.2 cm. Findings are consistent with nodal metastatic disease. Biopsy of the mass showed squamous cell carcinoma.  PET/CT showed hyppermetabolic mass extending from left glottis to anterior commisure. Solitary left level 2 metastatic Lymph node. No distant metastatic disease  Patient met with [facility] and decided against total laryngectomy. Plan is for concurrent chemo/RT  +/-adjuvantchemotherapy following chemo/RT.patient seen 5 cycles of weekly cisplatin chemotherapy with the last dose which was given on 04/30/2019.  Repeat PET scan on 07/19/2019 showed complete resolution of hypermetabolic activity associated with larynx along with resolution at level 3 lymph node.  No evidence of recurrent or residual disease.  Interval history- peg tube is out. Weight has remaiend stable but not better. He has poor dentition which makes it harder for him to swallow but he does not have significant pain on swallowing.  He is stilll using oxycontin and prn oxycodone but mainly for his toothache. He is seeing dentist for this  ECOG PS- 2 Pain scale- 5 Opioid associated constipation- no  Review of systems- Review of Systems  Constitutional: Positive for malaise/fatigue. Negative for chills, fever and weight loss.  HENT: Negative for congestion, ear discharge and nosebleeds.        Poor dentition  Eyes: Negative for blurred vision.  Respiratory: Negative for cough, hemoptysis, sputum production, shortness of breath and wheezing.   Cardiovascular: Negative for chest pain, palpitations, orthopnea and claudication.  Gastrointestinal: Negative for abdominal pain, blood in stool, constipation, diarrhea, heartburn, melena, nausea and vomiting.  Genitourinary: Negative for dysuria, flank pain, frequency, hematuria and urgency.  Musculoskeletal: Negative for back pain, joint pain and myalgias.  Skin: Negative for rash.  Neurological: Negative for dizziness, tingling, focal weakness, seizures, weakness and headaches.  Endo/Heme/Allergies: Does not bruise/bleed easily.  Psychiatric/Behavioral: Negative for depression and suicidal ideas. The patient does not have insomnia.        No Known Allergies   Past Medical History:  Diagnosis Date  . Anxiety   . Basal cell carcinoma   . Cigarette smoker   . Dental decay   .   Dysphagia   . Dyspnea   . End stage COPD (HCC)    end stage  copd, emphysema  . Erectile dysfunction   . Headache    every morning  . Hepatitis C    treated for 8 weeks this past year with Mayvret  . History of nonmelanoma skin cancer   . Hoarseness, chronic   . Myocardial infarction (HCC) march/april 2020   mild heart attack  . Prostate cancer (HCC)   . Respiratory failure, acute (HCC)    was inpt for 5 days. thought he was going to die.  . Sleep apnea   . Squamous cell skin cancer   . Throat cancer (HCC)   . Use of leuprolide acetate (Lupron)    treated for prostate cancer.   . 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     Past Surgical History:  Procedure Laterality Date  . FACIAL LACERATION REPAIR    . HERNIA REPAIR Left 1992   inguinal  . IR GASTROSTOMY TUBE MOD SED  04/26/2019  . MICROLARYNGOSCOPY N/A 01/17/2019   Procedure: MICROLARYNGOSCOPY WITH BX;  Surgeon: Bennett, Paul, MD;  Location: [facility] ORS;  Service: ENT;  Laterality: N/A;  . PORTA CATH INSERTION N/A 03/15/2019   Procedure: PORTA CATH INSERTION;  Surgeon: Dew, Jason S, MD;  Location: [facility] INVASIVE CV LAB;  Service: Cardiovascular;  Laterality: N/A;  . PROSTATE BIOPSY    . SKIN CANCER EXCISION    . TRIGGER FINGER RELEASE Left 10/2018    Social History   Socioeconomic History  . Marital status: Significant Other    Spouse name: Patti, girlfriend  . Number of children: Not on file  . Years of education: Not on file  . Highest education level: Not on file  Occupational History  . Occupation: contractor    Comment: retired  Tobacco Use  . Smoking status: Current Every Day Smoker    Packs/day: 0.25    Years: 52.00    Pack years: 13.00    Types: Cigarettes  . Smokeless tobacco: Never Used  . Tobacco comment: 5 cigarettes a day  Vaping Use  . Vaping Use: Never used  Substance and Sexual Activity  . Alcohol use: Not Currently    Alcohol/week: 0.0 standard drinks  . Drug use: Never  . Sexual activity: Yes  Other Topics Concern  . Not on file    Social History Narrative   Patient has retired d/t respiratory status. Currently lives with girlfriend    Social Determinants of Health   Financial Resource Strain: Low Risk   . Difficulty of Paying Living Expenses: Not hard at all  Food Insecurity: No Food Insecurity  . Worried About Running Out of Food in the Last Year: Never true  . Ran Out of Food in the Last Year: Never true  Transportation Needs: No Transportation Needs  . Lack of Transportation (Medical): No  . Lack of Transportation (Non-Medical): No  Physical Activity: Inactive  . Days of Exercise per Week: 0 days  . Minutes of Exercise per Session: 0 min  Stress: Stress Concern Present  . Feeling of Stress : Very much  Social Connections:   . Frequency of Communication with Friends and Family: Not on file  . Frequency of Social Gatherings with Friends and Family: Not on file  . Attends Religious Services: Not on file  . Active Member of Clubs or Organizations: Not on file  . Attends Club or Organization Meetings: Not on file  .   Marital Status: Not on file  Intimate Partner Violence:   . Fear of Current or Ex-Partner: Not on file  . Emotionally Abused: Not on file  . Physically Abused: Not on file  . Sexually Abused: Not on file    Family History  Problem Relation Age of Onset  . Cancer Sister   . Cancer - Cervical Mother   . Heart attack Brother   . Heart attack Maternal Grandfather      Current Outpatient Medications:  .  citalopram (CELEXA) 20 MG tablet, TAKE 1 TABLET(20 MG) BY MOUTH DAILY, Disp: 30 tablet, Rfl: 2 .  clonazePAM (KLONOPIN) 1 MG tablet, TAKE 1 TABLET(1 MG) BY MOUTH TWICE DAILY AS NEEDED FOR ANXIETY, Disp: 60 tablet, Rfl: 2 .  COMBIVENT RESPIMAT 20-100 MCG/ACT AERS respimat, INHALE 1 PUFF BY MOUTH INTO THE LUNGS EVERY 6 HOURS AS NEEDED, Disp: 4 g, Rfl: 0 .  fluticasone (FLONASE) 50 MCG/ACT nasal spray, SHAKE LIQUID AND USE 1 SPRAY IN EACH NOSTRIL TWICE DAILY, Disp: 16 g, Rfl: 1 .   ipratropium-albuterol (DUONEB) 0.5-2.5 (3) MG/3ML SOLN, Take 3 mLs by nebulization every 6 (six) hours as needed., Disp: 360 mL, Rfl: 11 .  naloxegol oxalate (MOVANTIK) 25 MG TABS tablet, Take 1 tablet (25 mg total) by mouth daily., Disp: 30 tablet, Rfl: 0 .  oxyCODONE (OXYCONTIN) 10 mg 12 hr tablet, Take 1 tablet (10 mg total) by mouth every 12 (twelve) hours., Disp: 60 tablet, Rfl: 0 .  Oxycodone HCl 10 MG TABS, Take 1 tablet (10 mg total) by mouth every 4 (four) hours as needed., Disp: 90 tablet, Rfl: 0 .  OXYGEN, Inhale 3 L into the lungs daily., Disp: , Rfl:  .  STIOLTO RESPIMAT 2.5-2.5 MCG/ACT AERS, INHALE 2 PUFFS INTO THE LUNGS DAILY, Disp: 4 g, Rfl: 5 .  prochlorperazine (COMPAZINE) 10 MG tablet, TAKE 1 TABLET(10 MG) BY MOUTH EVERY 6 HOURS AS NEEDED FOR NAUSEA OR VOMITING (Patient not taking: Reported on 09/18/2019), Disp: 30 tablet, Rfl: 1  Physical exam:  Vitals:   09/20/19 1040  BP: (!) 108/58  Pulse: 66  Resp: 16  Temp: 97.8 F (36.6 C)  TempSrc: Tympanic  SpO2: 98%  Weight: 97 lb 1.6 oz (44 kg)   Physical Exam Constitutional:      Comments: Thin and cachectic. On chronic home O2  HENT:     Head: Normocephalic and atraumatic.  Eyes:     Pupils: Pupils are equal, round, and reactive to light.  Cardiovascular:     Rate and Rhythm: Normal rate and regular rhythm.     Heart sounds: Normal heart sounds.  Pulmonary:     Effort: Pulmonary effort is normal.     Breath sounds: Normal breath sounds.  Abdominal:     General: Bowel sounds are normal.     Palpations: Abdomen is soft.  Musculoskeletal:     Cervical back: Normal range of motion.  Lymphadenopathy:     Comments: No palpable cervical adenopathy  Skin:    General: Skin is warm and dry.  Neurological:     Mental Status: He is alert and oriented to person, place, and time.      CMP Latest Ref Rng & Units 08/20/2019  Glucose 70 - 99 mg/dL 128(H)  BUN 8 - 23 mg/dL 16  Creatinine 0.61 - 1.24 mg/dL 0.41(L)  Sodium  135 - 145 mmol/L 133(L)  Potassium 3.5 - 5.1 mmol/L 4.2  Chloride 98 - 111 mmol/L 94(L)  CO2 22 - 32   mmol/L 34(H)  Calcium 8.9 - 10.3 mg/dL 8.1(L)  Total Protein 6.5 - 8.1 g/dL 6.7  Total Bilirubin 0.3 - 1.2 mg/dL 0.6  Alkaline Phos 38 - 126 U/L 56  AST 15 - 41 U/L 18  ALT 0 - 44 U/L 11   CBC Latest Ref Rng & Units 08/20/2019  WBC 4.0 - 10.5 K/uL 6.8  Hemoglobin 13.0 - 17.0 g/dL 9.8(L)  Hematocrit 39 - 52 % 30.0(L)  Platelets 150 - 400 K/uL 247    Assessment and plan- Patient is a 68 y.o. male WithSquamous cell carcinoma of the supraglottic larynx stage IVAcT4 acN1 cM0status post 5 weekly cycles of cisplatin and concurrent RT. This is a routine f/u visit  Clinically patient is doing well. He is also seeing Dr. Bennett for ent exams. Will consider repeating pet in jan 2022. Port to be taken out at this time  b12 deficiency- continue monthly b12 and will repeat labs in 3 months  Severe protein calorie malnutrition- peg tube is out. He is not having difficulty swallowing food but his poor dentition makes it harder.I have encouraged him to drink daily ensure  Dental pain- My plan Is to wean him off pain meds in 3 months. I have asked him to start going down on his dose. I do not plan to renew his pain meds beyond 3 months. I am reducing prn oxycodone dose to 5 mg Q8 prn and keeping oxycontin as is. Will not renew oxycontin after 1 month  Normocytic anemia: Likely secondary to chronic disease.  Mildly improving.  Repeat CBC ferritin and iron studies in 3 months   Visit Diagnosis 1. Encounter for follow-up surveillance of head and neck cancer   2. Severe protein-calorie malnutrition (HCC)   3. Opioid use      Dr. Jackeline Gutknecht, MD, MPH [facility] [phone] 09/20/2019 1:13 PM                

## 2019-09-20 NOTE — Telephone Encounter (Signed)
Put in referral to vascular to get port removed. Port in  In basket to [place] in vascular to make appt for port removal and call pt or girlfriend with appt date and time

## 2019-09-21 ENCOUNTER — Other Ambulatory Visit: Payer: Self-pay | Admitting: *Deleted

## 2019-09-21 MED ORDER — NALOXEGOL OXALATE 25 MG PO TABS
25.0000 mg | ORAL_TABLET | Freq: Every day | ORAL | 0 refills | Status: DC
Start: 2019-09-21 — End: 2019-10-30

## 2019-09-21 NOTE — Telephone Encounter (Signed)
Patient requests refill for Movantik order pended per MD approval.

## 2019-09-25 ENCOUNTER — Telehealth (INDEPENDENT_AMBULATORY_CARE_PROVIDER_SITE_OTHER): Payer: Self-pay

## 2019-09-25 ENCOUNTER — Telehealth: Payer: Self-pay

## 2019-09-25 NOTE — Telephone Encounter (Signed)
Spoke with the patient and he is scheduled with Dr. Lucky Cowboy for a port removal on 10/08/19 with a 9:00 am arrival time to the [facility]. Covid testing on 10/04/19 between 8-1 pm at the [facility]. Pre-procedure instructions were discussed and will be mailed.

## 2019-09-26 LAB — DRUG SCREEN 764883 11+OXYCO+ALC+CRT-BUND
Amphetamines, Urine: NEGATIVE ng/mL
BENZODIAZ UR QL: NEGATIVE ng/mL
Barbiturate: NEGATIVE ng/mL
Cocaine (Metabolite): NEGATIVE ng/mL
Creatinine: 84.8 mg/dL (ref 20.0–300.0)
Ethanol: NEGATIVE %
Meperidine: NEGATIVE ng/mL
Methadone Screen, Urine: NEGATIVE ng/mL
OPIATE SCREEN URINE: NEGATIVE ng/mL
Phencyclidine: NEGATIVE ng/mL
Propoxyphene: NEGATIVE ng/mL
Tramadol: NEGATIVE ng/mL
pH, Urine: 6.2 (ref 4.5–8.9)

## 2019-09-26 LAB — OXYCODONE/OXYMORPHONE, CONFIRM
OXYCODONE/OXYMORPH: POSITIVE — AB
OXYCODONE: 347 ng/mL
OXYCODONE: POSITIVE — AB
OXYMORPHONE: NEGATIVE

## 2019-09-26 LAB — CANNABINOID CONFIRMATION, UR
CANNABINOIDS: POSITIVE — AB
Carboxy THC GC/MS Conf: 17 ng/mL

## 2019-10-02 ENCOUNTER — Other Ambulatory Visit: Payer: Medicare Other

## 2019-10-04 ENCOUNTER — Other Ambulatory Visit: Payer: Self-pay

## 2019-10-04 ENCOUNTER — Other Ambulatory Visit
Admission: RE | Admit: 2019-10-04 | Discharge: 2019-10-04 | Disposition: A | Discharge status: Home / Self Care | Payer: Medicare Other | Source: Ambulatory Visit | Attending: Vascular Surgery | Admitting: Vascular Surgery

## 2019-10-04 DIAGNOSIS — Z01812 Encounter for preprocedural laboratory examination: Secondary | ICD-10-CM | POA: Diagnosis not present

## 2019-10-04 DIAGNOSIS — Z20822 Contact with and (suspected) exposure to covid-19: Secondary | ICD-10-CM | POA: Insufficient documentation

## 2019-10-04 LAB — SARS CORONAVIRUS 2 (TAT 6-24 HRS): SARS Coronavirus 2: NEGATIVE

## 2019-10-05 NOTE — Telephone Encounter (Signed)
Returned a call to the patient about taking his Oxycodone and OxyContin before his IVC filter removal on Monday with Dr. Lucky Cowboy. I explained that he can take all meds with small sips of water and no coffee when he asked.

## 2019-10-07 ENCOUNTER — Other Ambulatory Visit (INDEPENDENT_AMBULATORY_CARE_PROVIDER_SITE_OTHER): Payer: Self-pay | Admitting: Nurse Practitioner

## 2019-10-08 ENCOUNTER — Other Ambulatory Visit: Payer: Self-pay

## 2019-10-08 ENCOUNTER — Encounter
Admission: RE | Disposition: A | Discharge status: Home / Self Care | Payer: Self-pay | Source: Home / Self Care | Attending: Vascular Surgery

## 2019-10-08 ENCOUNTER — Ambulatory Visit
Admission: RE | Admit: 2019-10-08 | Discharge: 2019-10-08 | Disposition: A | Discharge status: Home / Self Care | Payer: Medicare Other | Attending: Vascular Surgery | Admitting: Vascular Surgery

## 2019-10-08 ENCOUNTER — Encounter: Payer: Self-pay | Admitting: Anesthesiology

## 2019-10-08 ENCOUNTER — Encounter: Payer: Self-pay | Admitting: Vascular Surgery

## 2019-10-08 DIAGNOSIS — F419 Anxiety disorder, unspecified: Secondary | ICD-10-CM | POA: Insufficient documentation

## 2019-10-08 DIAGNOSIS — F17210 Nicotine dependence, cigarettes, uncomplicated: Secondary | ICD-10-CM | POA: Diagnosis not present

## 2019-10-08 DIAGNOSIS — Z8049 Family history of malignant neoplasm of other genital organs: Secondary | ICD-10-CM | POA: Diagnosis not present

## 2019-10-08 DIAGNOSIS — Z809 Family history of malignant neoplasm, unspecified: Secondary | ICD-10-CM | POA: Diagnosis not present

## 2019-10-08 DIAGNOSIS — Z8249 Family history of ischemic heart disease and other diseases of the circulatory system: Secondary | ICD-10-CM | POA: Diagnosis not present

## 2019-10-08 DIAGNOSIS — J449 Chronic obstructive pulmonary disease, unspecified: Secondary | ICD-10-CM | POA: Insufficient documentation

## 2019-10-08 DIAGNOSIS — I252 Old myocardial infarction: Secondary | ICD-10-CM | POA: Diagnosis not present

## 2019-10-08 DIAGNOSIS — E43 Unspecified severe protein-calorie malnutrition: Secondary | ICD-10-CM | POA: Insufficient documentation

## 2019-10-08 DIAGNOSIS — G4730 Sleep apnea, unspecified: Secondary | ICD-10-CM | POA: Insufficient documentation

## 2019-10-08 DIAGNOSIS — Z79899 Other long term (current) drug therapy: Secondary | ICD-10-CM | POA: Insufficient documentation

## 2019-10-08 DIAGNOSIS — Z9981 Dependence on supplemental oxygen: Secondary | ICD-10-CM | POA: Diagnosis not present

## 2019-10-08 DIAGNOSIS — Z681 Body mass index (BMI) 19 or less, adult: Secondary | ICD-10-CM | POA: Insufficient documentation

## 2019-10-08 DIAGNOSIS — K0889 Other specified disorders of teeth and supporting structures: Secondary | ICD-10-CM | POA: Insufficient documentation

## 2019-10-08 DIAGNOSIS — Z8546 Personal history of malignant neoplasm of prostate: Secondary | ICD-10-CM | POA: Insufficient documentation

## 2019-10-08 DIAGNOSIS — D649 Anemia, unspecified: Secondary | ICD-10-CM | POA: Diagnosis not present

## 2019-10-08 DIAGNOSIS — Z452 Encounter for adjustment and management of vascular access device: Secondary | ICD-10-CM | POA: Insufficient documentation

## 2019-10-08 DIAGNOSIS — Z85828 Personal history of other malignant neoplasm of skin: Secondary | ICD-10-CM | POA: Insufficient documentation

## 2019-10-08 DIAGNOSIS — E538 Deficiency of other specified B group vitamins: Secondary | ICD-10-CM | POA: Diagnosis not present

## 2019-10-08 DIAGNOSIS — C329 Malignant neoplasm of larynx, unspecified: Secondary | ICD-10-CM

## 2019-10-08 DIAGNOSIS — F1190 Opioid use, unspecified, uncomplicated: Secondary | ICD-10-CM | POA: Insufficient documentation

## 2019-10-08 DIAGNOSIS — C321 Malignant neoplasm of supraglottis: Secondary | ICD-10-CM | POA: Diagnosis not present

## 2019-10-08 HISTORY — PX: PORTA CATH REMOVAL: CATH118286

## 2019-10-08 SURGERY — PORTA CATH REMOVAL
Anesthesia: Moderate Sedation

## 2019-10-08 MED ORDER — DIPHENHYDRAMINE HCL 50 MG/ML IJ SOLN: 50.0000 mg | Freq: Once | INTRAMUSCULAR | Status: DC | PRN

## 2019-10-08 MED ORDER — METHYLPREDNISOLONE SODIUM SUCC 125 MG IJ SOLR: 125.0000 mg | Freq: Once | INTRAMUSCULAR | Status: DC | PRN

## 2019-10-08 MED ORDER — HYDROMORPHONE HCL 1 MG/ML IJ SOLN: 1.0000 mg | Freq: Once | INTRAMUSCULAR | Status: DC | PRN

## 2019-10-08 MED ORDER — ONDANSETRON HCL 4 MG/2ML IJ SOLN: 4.0000 mg | Freq: Four times a day (QID) | INTRAMUSCULAR | Status: DC | PRN

## 2019-10-08 MED ORDER — FENTANYL CITRATE (PF) 100 MCG/2ML IJ SOLN
INTRAMUSCULAR | Status: AC
  Filled 2019-10-08: qty 2

## 2019-10-08 MED ORDER — FAMOTIDINE 20 MG PO TABS: 40.0000 mg | ORAL_TABLET | Freq: Once | ORAL | Status: DC | PRN

## 2019-10-08 MED ORDER — MIDAZOLAM HCL 2 MG/2ML IJ SOLN
INTRAMUSCULAR | Status: DC | PRN
  Administered 2019-10-08: 2 mg via INTRAVENOUS
  Administered 2019-10-08: 1 mg via INTRAVENOUS

## 2019-10-08 MED ORDER — SODIUM CHLORIDE 0.9 % IV SOLN
INTRAVENOUS | Status: DC
  Administered 2019-10-08: 1000 mL via INTRAVENOUS

## 2019-10-08 MED ORDER — MIDAZOLAM HCL 5 MG/5ML IJ SOLN
INTRAMUSCULAR | Status: AC
  Filled 2019-10-08: qty 5

## 2019-10-08 MED ORDER — CEFAZOLIN SODIUM-DEXTROSE 2-4 GM/100ML-% IV SOLN
2.0000 g | Freq: Once | INTRAVENOUS | Status: AC
  Administered 2019-10-08: 2 g via INTRAVENOUS

## 2019-10-08 MED ORDER — FENTANYL CITRATE (PF) 100 MCG/2ML IJ SOLN
INTRAMUSCULAR | Status: DC | PRN
Start: 2019-10-08 — End: 2019-10-08
  Administered 2019-10-08: 25 ug via INTRAVENOUS
  Administered 2019-10-08: 50 ug via INTRAVENOUS

## 2019-10-08 MED ORDER — MIDAZOLAM HCL 2 MG/ML PO SYRP: 8.0000 mg | ORAL_SOLUTION | Freq: Once | ORAL | Status: DC | PRN

## 2019-10-08 SURGICAL SUPPLY — 5 items
PACK ANGIOGRAPHY (CUSTOM PROCEDURE TRAY) ×3 IMPLANT
PENCIL ELECTRO HAND CTR (MISCELLANEOUS) ×3 IMPLANT
SUT MNCRL AB 4-0 PS2 18 (SUTURE) ×3 IMPLANT
SUT VIC AB 3-0 SH 27 (SUTURE) ×2
SUT VIC AB 3-0 SH 27X BRD (SUTURE) ×1 IMPLANT

## 2019-10-08 NOTE — Discharge Instructions (Signed)
Implanted Port Removal, Care After Refer to this sheet in the next few weeks. These instructions provide you with information about caring for yourself after your procedure. Your health care provider may also give you more specific instructions. Your treatment has been planned according to current medical practices, but problems sometimes occur. Call your health care provider if you have any problems or questions after your procedure. What can I expect after the procedure? After the procedure, it is common to have: Soreness or pain near your incision. Some swelling or bruising near your incision.  Follow these instructions at home: Medicines Take over-the-counter and prescription medicines only as told by your health care provider. If you were prescribed an antibiotic medicine, take it as told by your health care provider. Do not stop taking the antibiotic even if you start to feel better. Bathing You may shower tomorrow.  Incision care You have an incision with sutures that are dissolvable and skin glue over top incision.  This skin glue will wear off over time.  Do not peel or try to remove this yourself. Keep your dressing dry. Check your incision area every day for signs of infection and if present call your doctor and let them know. Check for: More redness, swelling, or pain. More fluid or blood. Warmth. Pus or a bad smell. Driving If you received a sedative, do not drive for 24 hours after the procedure. If you did not receive a sedative, ask your health care provider when it is safe to drive. Activity Return to your normal activities as told by your health care provider. Ask your health care provider what activities are safe for you. Until your health care provider says it is safe: Do not lift anything that is heavier than 10 lb (4.5 kg). Do not do activities that involve lifting your arms over your head. General instructions Do not use any tobacco products, such as cigarettes,  chewing tobacco, and e-cigarettes. Tobacco can delay healing. If you need help quitting, ask your health care provider. Keep all follow-up visits as told by your health care provider. This is important. Contact a health care provider if: You have more redness, swelling, or pain around your incision. You have more fluid or blood coming from your incision. Your incision feels warm to the touch. You have pus or a bad smell coming from your incision. You have a fever. You have pain that is not relieved by your pain medicine. Get help right away if: You have chest pain. You have difficulty breathing. This information is not intended to replace advice given to you by your health care provider. Make sure you discuss any questions you have with your health care provider. Document Released: 12/16/2014 Document Revised: 06/12/2015 Document Reviewed: 10/09/2014 Elsevier Interactive Patient Education  2018 Elsevier Inc.  

## 2019-10-08 NOTE — Op Note (Signed)
[facility]       Operative Note  Date: 10/08/2019  Preoperative diagnosis:  1. Head and neck cancer no longer using port  Postoperative diagnosis:  Same as above  Procedures: #1. Removal of right jugular port a cath   Surgeon: Leotis Pain, MD  Anesthesia: Local with moderate conscious sedation for 22 minutes using 3 mg of Versed and 75 mcg of Fentanyl  Fluoroscopy time: none  Contrast used: 0  Estimated blood loss: Minimal  Indication for the procedure:  The patient is a 69 y.o. male who has completed his  and no longer needs their Port-A-Cath. The patient desires to have this removed. Risks and benefits including need for potential replacement with recurrent disease were discussed and patient is agreeable to proceed.  Description of procedure: The patient was brought to the vascular and interventional radiology suite. Moderate conscious sedation was administered during a face to face encounter with the patient throughout the procedure with my supervision of the RN administering medicines and monitoring the patient's vital signs, pulse oximetry, telemetry and mental status throughout from the start of the procedure until the patient was taken to the recovery room.  The right neck chest and shoulder were sterilely prepped and draped, and a sterile surgical field was created. The area was then anesthetized with 1% lidocaine copiously. The previous incision was reopened and electrocautery used to dissected down to the port and the catheter. These were dissected free and the catheter was gently removed from the vein in its entirety. The port was dissected out from the fibrous connective tissue and the Prolene sutures were removed. The port was then removed in its entirety including the catheter. The wound was then closed with a 3-0 Vicryl and a 4-0 Monocryl and Dermabond was placed as a dressing. The patient was then taken to the  recovery room in stable condition having tolerated the procedure well.  Complications: none  Condition: stable   Leotis Pain, MD 10/08/2019 10:31 AM   This note was created with Dragon Medical transcription system. Any errors in dictation are purely unintentional.

## 2019-10-08 NOTE — Interval H&P Note (Signed)
History and Physical Interval Note:  10/08/2019 10:11 AM  Kevin Nelson  has presented today for surgery, with the diagnosis of Porta Cath Removal    Sqaniys cell Ca of larynx Covid  Sept 20.  The various methods of treatment have been discussed with the patient and family. After consideration of risks, benefits and other options for treatment, the patient has consented to  Procedure(s): PORTA CATH REMOVAL (N/A) as a surgical intervention.  The patient's history has been reviewed, patient examined, no change in status, stable for surgery.  I have reviewed the patient's chart and labs.  Questions were answered to the patient's satisfaction.     Leotis Pain

## 2019-10-09 ENCOUNTER — Telehealth: Payer: Self-pay | Admitting: Pharmacist

## 2019-10-09 ENCOUNTER — Other Ambulatory Visit: Payer: Medicare Other | Admitting: Adult Health Nurse Practitioner

## 2019-10-09 DIAGNOSIS — C329 Malignant neoplasm of larynx, unspecified: Secondary | ICD-10-CM

## 2019-10-09 DIAGNOSIS — Z515 Encounter for palliative care: Secondary | ICD-10-CM | POA: Diagnosis not present

## 2019-10-09 NOTE — Progress Notes (Signed)
[facility] Consult Note Telephone: [phone]  Fax: [phone]  PATIENT NAME: Kevin Nelson DOB: 08-04-1950 MRN: [id number]  PRIMARY CARE PROVIDER:   [name], NP  REFERRING PROVIDER:  [name], NP [address] [place],  [place] [address]  RESPONSIBLE PARTY:  Self [place], [place]      RECOMMENDATIONS and PLAN:  1.  Advanced care planning. Listed as [place]  2.  Nutritional status.  Patient has had PEG tube removed.  Has good appetite.  Does have problems with chewing due to poor dentition.  Is being seen by dental professional.  Is supplementing with Ensure as much as possible but does not tolerate very well.  Has been using pedialyte.  Encouraged smaller more frequent meals and to get plenty of protein.  Current weight is 97 pounds with BMI of 15.67.  Appetite better but patient still needs to gain weight.  Continue to monitor weight gain  3.  Pain.  Oncologist trying to taper off of his oxycodone.  Continues with oxycontin 10 mg BID.  Oxycodone 10 mg Q4hrs PRN and been reduced to 5 mg Q8hrs PRN.  States that in a month oncologist will cut back his oxycontin to 5mg  once a day. He is doing well with this so far but has concerns that when going through his dental work that his pain will increase.  This will be addressed as he goes through his dental work.    4.  Cough.  Patient received second Covid vaccination about a month ago and has had increased cough since.  Have suggested taking mucinex or Robitussin to help manage the cough.  Cough could also be related to his COPD and the guaifenesin could help mange the mucus to help with the cough.  Palliative care will continue to monitor for symptom management and make recommendations as needed. Next appointment in 8 weeks. Encouraged to call with any concerns prior to that.  I spent 60 minutes providing this consultation,  from 10:00 to  11:00 including time with patient/family, chart review, provider coordination, and documentation. More than 50% of the time in this consultation was spent coordinating communication.   HISTORY OF PRESENT ILLNESS:  Kevin Nelson is a 69 y.o. year old male with multiple medical problems including severe stage 3 COPD,larynx cancer, prostate cancer, skin cancer, CAD, chronic hepatitis. Palliative Care was asked to help address goals of care.   Patient has finished chemo and radiation and is feeling better.  States that PET scan did not show any more cancer and ENT performed scope and did not see any more cancer.  Denies fever, increased SOB, chest pain, N/V/D, constipation, headaches.  Has not had any falls, infections, or hospital visits since last visit.  CODE STATUS: see above  PPS: 60% HOSPICE ELIGIBILITY/DIAGNOSIS: TBD  PHYSICAL EXAM:   General: NAD, frail appearing, thin Cardiovascular: regular rate and rhythm Pulmonary: lung sounds diminished; no abnormal breath sounds heard; normal respiratory effort Extremities: no edema, no joint deformities Skin: no rashes on exposed skin Neurological: Weakness but otherwise nonfocal  PAST MEDICAL HISTORY:  Past Medical History:  Diagnosis Date  . Anxiety   . Basal cell carcinoma   . Cigarette smoker   . Dental decay   . Dysphagia   . Dyspnea   . End stage COPD ([place])    end stage copd, emphysema  . Erectile dysfunction   . Headache    every morning  .  Hepatitis C    treated for 8 weeks this past year with Mayvret  . History of nonmelanoma skin cancer   . Hoarseness, chronic   . Myocardial infarction [facility]) march/april 2020   mild heart attack  . Prostate cancer ([place])   . Respiratory failure, acute ([place])    was inpt for 5 days. thought he was going to die.  . Sleep apnea   . Squamous cell skin cancer   . Throat cancer ([place])   . Use of leuprolide acetate (Lupron)    treated for prostate cancer.   . 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/2018   left  false vocal cord mass    SOCIAL HX:  Social History   Tobacco Use  . Smoking status: Current Every Day Smoker    Packs/day: 0.25    Years: 52.00    Pack years: 13.00    Types: Cigarettes  . Smokeless tobacco: Never Used  . Tobacco comment: 5 cigarettes a day  Substance Use Topics  . Alcohol use: Not Currently    Alcohol/week: 0.0 standard drinks    ALLERGIES: No Known Allergies   PERTINENT MEDICATIONS:  Outpatient Encounter Medications as of 10/09/2019  Medication Sig  . citalopram (CELEXA) 20 MG tablet TAKE 1 TABLET(20 MG) BY MOUTH DAILY  . clonazePAM (KLONOPIN) 1 MG tablet TAKE 1 TABLET(1 MG) BY MOUTH TWICE DAILY AS NEEDED FOR ANXIETY  . COMBIVENT RESPIMAT 20-100 MCG/ACT AERS respimat INHALE 1 PUFF BY MOUTH INTO THE LUNGS EVERY 6 HOURS AS NEEDED  . fluticasone (FLONASE) 50 MCG/ACT nasal spray SHAKE LIQUID AND USE 1 SPRAY IN EACH NOSTRIL TWICE DAILY  . ipratropium-albuterol (DUONEB) 0.5-2.5 (3) MG/3ML SOLN Take 3 mLs by nebulization every 6 (six) hours as needed.  . naloxegol oxalate (MOVANTIK) 25 MG TABS tablet Take 1 tablet (25 mg total) by mouth daily.  [name] oxyCODONE (OXY IR/ROXICODONE) 5 MG immediate release tablet Take 1 tablet (5 mg total) by mouth every 8 (eight) hours as needed for severe pain.  [name] oxyCODONE (OXYCONTIN) 10 mg 12 hr tablet Take 1 tablet (10 mg total) by mouth every 12 (twelve) hours.  . OXYGEN Inhale 3 L into the lungs daily.  . prochlorperazine (COMPAZINE) 10 MG tablet TAKE 1 TABLET(10 MG) BY MOUTH EVERY 6 HOURS AS NEEDED FOR NAUSEA OR VOMITING (Patient not taking: Reported on 09/18/2019)  . STIOLTO RESPIMAT 2.5-2.5 MCG/ACT AERS INHALE 2 PUFFS INTO THE LUNGS DAILY   No facility-administered encounter medications on file as of 10/09/2019.      Jailyn Leeson Jenetta Downer, NP

## 2019-10-09 NOTE — Progress Notes (Signed)
Unable to leave patient a voice message to reschedule appointment due to phone inbox being full .  Georgiana Shore ,[place] Pharmacist Assistant [phone]

## 2019-10-15 ENCOUNTER — Ambulatory Visit: Payer: Medicare Other | Admitting: Pharmacist

## 2019-10-15 DIAGNOSIS — J449 Chronic obstructive pulmonary disease, unspecified: Secondary | ICD-10-CM

## 2019-10-15 DIAGNOSIS — F17210 Nicotine dependence, cigarettes, uncomplicated: Secondary | ICD-10-CM

## 2019-10-15 NOTE — Patient Instructions (Signed)
Visit Information  It was a pleasure speaking with you today. Thank you for letting me be part of your clinical team. Please call with any questions or concerns.   Goals Addressed              This Visit's Progress     Pharm D "I want to take care of myself" (pt-stated)        Current Barriers:   Polypharmacy; complex patient with multiple comorbidities including COPD, tobacco abuse, prostate cancer, s/p tx for Hep C; current tx for laryngeal cancer. Patient notes today significant frustration at having to wait until 10/29/19 to have his bottom teeth extracted. Notes he has teeth breaking off at the root and it makes it difficult to eat. He states he has been able to gain a little weight and credits his fiancee for taking great care of him. His PEG tube and portacath have been removed. He has completed chemo and radiation and PET scan showed no further evidence of malignancy. He is excellent spirits and states "I feel good."   Palliative care team [name], NP involved. Last visit 10/09/19  Notes that his finance, [name], has been helping manage his medications and diet at home  Squamous cell carcinoma of larynx: seen by [facility] pulm/ENT but following w/ [facility] currently - Pain: Oncologist has initiated three month taper of pain meds with plan to d/c at that time. Will discontinue oxycontin in one month. Current regimen oxycodone ER 10 mg BID, oxycodone IR 5mg  q8h prn. Patient reports he is doing well with reduced dose and denies symptoms of withdrawal. - Bowel regimen: senokot syrup PRN o COPD/tobacco abuse: Stiolto daily, Duonebs or Combivent as needed. Follows w/ Dr. Camillo Flaming pulmonary. Indicates he and fiancee had second COVID vaccine ~ three weeks ago and has cough, body aches angd lethargy. Reports a similar experience with the first dose. o S/p tx Hep C: s/p Mayvret. o Anxiety: clonazepam 1 mg BID; Citalopram 20 mg qd o B12 deficiency. Cyanocobalamin 1000 mcg monthly. Last  level 263 04/09/19 will repeat in December per Dr. Janese Banks  Pharmacist Clinical Goal(s):   Over the next 90 days, patient will work with PharmD and provider towards optimized medication management  Interventions:  Comprehensive medication review performed, medication list updated in electronic medical record  Inter-disciplinary care team collaboration (see longitudinal plan of care)  Patient Self Care Activities:   Patient will take medications as prescribed  Please see past updates related to this goal by clicking on the "Past Updates" button in the selected goal          The patient verbalized understanding of instructions provided today and agreed to receive a mailed copy of patient instruction and/or educational materials.  Telephone follow up appointment with pharmacy team member scheduled for: 3 months   Junita Push. Kenton Kingfisher PharmD, BCPS Clinical Pharmacist [phone]  Mindfulness-Based Stress Reduction Mindfulness-based stress reduction (MBSR) is a program that helps people learn to practice mindfulness. Mindfulness is the practice of intentionally paying attention to the present moment. It can be learned and practiced through techniques such as education, breathing exercises, meditation, and yoga. MBSR includes several mindfulness techniques in one program. MBSR works best when you understand the treatment, are willing to try new things, and can commit to spending time practicing what you learn. MBSR training may include learning about:  How your emotions, thoughts, and reactions affect your body.  New ways to respond to things that cause negative thoughts to start (  triggers).  How to notice your thoughts and let go of them.  Practicing awareness of everyday things that you normally do without thinking.  The techniques and goals of different types of meditation. What are the benefits of MBSR? MBSR can have many benefits, which include helping you to:  Develop  self-awareness. This refers to knowing and understanding yourself.  Learn skills and attitudes that help you to participate in your own health care.  Learn new ways to care for yourself.  Be more accepting about how things are, and let things go.  Be less judgmental and approach things with an open mind.  Be patient with yourself and trust yourself more. MBSR has also been shown to:  Reduce negative emotions, such as depression and anxiety.  Improve memory and focus.  Change how you sense and approach pain.  Boost your body's ability to fight infections.  Help you connect better with other people.  Improve your sense of well-being. Follow these instructions at home:   Find a local in-person or online MBSR program.  Set aside some time regularly for mindfulness practice.  Find a mindfulness practice that works best for you. This may include one or more of the following: ? Meditation. Meditation involves focusing your mind on a certain thought or activity. ? Breathing awareness exercises. These help you to stay present by focusing on your breath. ? Body scan. For this practice, you lie down and pay attention to each part of your body from head to toe. You can identify tension and soreness and intentionally relax parts of your body. ? Yoga. Yoga involves stretching and breathing, and it can improve your ability to move and be flexible. It can also provide an experience of testing your body's limits, which can help you release stress. ? Mindful eating. This way of eating involves focusing on the taste, texture, color, and smell of each bite of food. Because this slows down eating and helps you feel full sooner, it can be an important part of a weight-loss plan.  Find a podcast or recording that provides guidance for breathing awareness, body scan, or meditation exercises. You can listen to these any time when you have a free moment to rest without distractions.  Follow your  treatment plan as told by your health care provider. This may include taking regular medicines and making changes to your diet or lifestyle as recommended. How to practice mindfulness To do a basic awareness exercise:  Find a comfortable place to sit.  Pay attention to the present moment. Observe your thoughts, feelings, and surroundings just as they are.  Avoid placing judgment on yourself, your feelings, or your surroundings. Make note of any judgment that comes up, and let it go.  Your mind may wander, and that is okay. Make note of when your thoughts drift, and return your attention to the present moment. To do basic mindfulness meditation:  Find a comfortable place to sit. This may include a stable chair or a firm floor cushion. ? Sit upright with your back straight. Let your arms fall next to your side with your hands resting on your legs. ? If sitting in a chair, rest your feet flat on the floor. ? If sitting on a cushion, cross your legs in front of you.  Keep your head in a neutral position with your chin dropped slightly. Relax your jaw and rest the tip of your tongue on the roof of your mouth. Drop your gaze to the floor. You  can close your eyes if you like.  Breathe normally and pay attention to your breath. Feel the air moving in and out of your nose. Feel your belly expanding and relaxing with each breath.  Your mind may wander, and that is okay. Make note of when your thoughts drift, and return your attention to your breath.  Avoid placing judgment on yourself, your feelings, or your surroundings. Make note of any judgment or feelings that come up, let them go, and bring your attention back to your breath.  When you are ready, lift your gaze or open your eyes. Pay attention to how your body feels after the meditation. Where to find more information You can find more information about MBSR from:  Your health care provider.  Community-based meditation centers or  programs.  Programs offered near you. Summary  Mindfulness-based stress reduction (MBSR) is a program that teaches you how to intentionally pay attention to the present moment. It is used with other treatments to help you cope better with daily stress, emotions, and pain.  MBSR focuses on developing self-awareness, which allows you to respond to life stress without judgment or negative emotions.  MBSR programs may involve learning different mindfulness practices, such as breathing exercises, meditation, yoga, body scan, or mindful eating. Find a mindfulness practice that works best for you, and set aside time for it on a regular basis. This information is not intended to replace advice given to you by your health care provider. Make sure you discuss any questions you have with your health care provider. Document Revised: 12/17/2016 Document Reviewed: 05/13/2016 Elsevier Patient Education  [place].

## 2019-10-15 NOTE — Chronic Care Management (AMB) (Signed)
Chronic Care Management Pharmacy  Name: Kevin Nelson  MRN: [id number] DOB: 10/12/1950   Chief Complaint/ HPI  Kevin Nelson,  69 y.o. , male presents for their Follow-Up CCM visit with the clinical pharmacist via telephone due to COVID-19 Pandemic.  PCP : [name], NP Patient Care Team: [name], NP as PCP - General (Nurse Practitioner) Greg Cutter, LCSW as Social Worker (Licensed Clinical Social Worker) Hall Busing, Nobie Putnam, RN as Case Manager (General Practice) Vladimir Faster, [facility] (Pharmacist)  Their chronic conditions include: COPD and Anxiety tobacco abure, h/o laryngeal cancer, prostate cancer   Office Visits: 09/18/19- Marnee Guarneri, NP-  Bloodwork, UDS,   Consult Visit: 9/20/21Glori Luis cath removed 09/20/19- Dr. Janese Banks, Hem/[facility]- labwork, decreased oxy ir to 5 mg q8h prn,   No Known Allergies  Medications: Outpatient Encounter Medications as of 10/15/2019  Medication Sig Note   citalopram (CELEXA) 20 MG tablet TAKE 1 TABLET(20 MG) BY MOUTH DAILY    clonazePAM (KLONOPIN) 1 MG tablet TAKE 1 TABLET(1 MG) BY MOUTH TWICE DAILY AS NEEDED FOR ANXIETY    COMBIVENT RESPIMAT 20-100 MCG/ACT AERS respimat INHALE 1 PUFF BY MOUTH INTO THE LUNGS EVERY 6 HOURS AS NEEDED    Cyanocobalamin 1000 MCG/ML KIT Inject 1,000 mcg as directed every 30 (thirty) days.    fluticasone (FLONASE) 50 MCG/ACT nasal spray SHAKE LIQUID AND USE 1 SPRAY IN EACH NOSTRIL TWICE DAILY    ipratropium-albuterol (DUONEB) 0.5-2.5 (3) MG/3ML SOLN Take 3 mLs by nebulization every 6 (six) hours as needed. 10/15/2019: prn   naloxegol oxalate (MOVANTIK) 25 MG TABS tablet Take 1 tablet (25 mg total) by mouth daily.    oxyCODONE (OXY IR/ROXICODONE) 5 MG immediate release tablet Take 1 tablet (5 mg total) by mouth every 8 (eight) hours as needed for severe pain.    oxyCODONE (OXYCONTIN) 10 mg 12 hr tablet Take 1 tablet (10 mg total) by mouth every 12 (twelve) hours.    OXYGEN Inhale 3 L into the lungs  daily.    STIOLTO RESPIMAT 2.5-2.5 MCG/ACT AERS INHALE 2 PUFFS INTO THE LUNGS DAILY    prochlorperazine (COMPAZINE) 10 MG tablet TAKE 1 TABLET(10 MG) BY MOUTH EVERY 6 HOURS AS NEEDED FOR NAUSEA OR VOMITING (Patient not taking: Reported on 09/18/2019)    No facility-administered encounter medications on file as of 10/15/2019.    Wt Readings from Last 3 Encounters:  10/08/19 103 lb (46.7 kg)  09/20/19 97 lb 1.6 oz (44 kg)  09/18/19 95 lb 6 oz (43.3 kg)    Current Diagnosis/Assessment:    Goals Addressed              This Visit's Progress     Pharm D "I want to take care of myself" (pt-stated)        Current Barriers:   Polypharmacy; complex patient with multiple comorbidities including COPD, tobacco abuse, prostate cancer, s/p tx for Hep C; current tx for laryngeal cancer. Patient notes today significant frustration at having to wait until 10/29/19 to have his bottom teeth extracted. Notes he has teeth breaking off at the root and it makes it difficult to eat. He states he has been able to gain a little weight and credits his fiancee for taking great care of him. His PEG tube and portacath have been removed. He has completed chemo and radiation and PET scan showed no further evidence of malignancy. He is excellent spirits and states "I feel good."   Palliative care team [name], NP  involved. Last visit 10/09/19  Notes that his finance, [name], has been helping manage his medications and diet at home  Squamous cell carcinoma of larynx: seen by [facility] pulm/ENT but following w/ [facility] currently - Pain: Oncologist has initiated three month taper of pain meds with plan to d/c at that time. Will discontinue oxycontin in one month. Current regimen oxycodone ER 10 mg BID, oxycodone IR 27m q8h prn. Patient reports he is doing well with reduced dose and denies symptoms of withdrawal. - Bowel regimen: senokot syrup PRN o COPD/tobacco abuse: Stiolto daily, Duonebs or Combivent as  needed. Follows w/ Dr. GCamillo Flamingpulmonary. Indicates he and fiancee had second COVID vaccine ~ three weeks ago and has cough, body aches angd lethargy. Reports a similar experience with the first dose. o S/p tx Hep C: s/p Mayvret. o Anxiety: clonazepam 1 mg BID; Citalopram 20 mg qd o B12 deficiency. Cyanocobalamin 1000 mcg monthly. Last level 263 04/09/19 will repeat in December per Dr. RJanese Banks Pharmacist Clinical Goal(s):   Over the next 90 days, patient will work with PharmD and provider towards optimized medication management  Interventions:  Comprehensive medication review performed, medication list updated in electronic medical record  Inter-disciplinary care team collaboration (see longitudinal plan of care)  Patient Self Care Activities:   Patient will take medications as prescribed  Please see past updates related to this goal by clicking on the "Past Updates" button in the selected goal          COPD / Asthma / Tobacco   Last spirometry score: 02/14/18  Very Severe Current COPD Classification:  D (high sx, >/=2 exacerbations/yr)  Eosinophil count:   Lab Results  Component Value Date/Time   EOSPCT 3 08/20/2019 02:17 PM  %                               Eos (Absolute):  Lab Results  Component Value Date/Time   EOSABS 0.2 08/20/2019 02:17 PM   EOSABS 0.2 04/19/2018 02:56 PM    Tobacco Status:  Social History   Tobacco Use  Smoking Status Current Every Day Smoker   Packs/day: 0.25   Years: 52.00   Pack years: 13.00   Types: Cigarettes  Smokeless Tobacco Never Used  Tobacco Comment   5 cigarettes a day    Patient has failed these meds in past:  Patient is currently controlled on the following medications:  Stiolto Respimat  2 puffs daily Combivent 1 puff q6h prn Duoneb q6 h prn    Using maintenance inhaler regularly? Yes   We discussed: patient is not ready to quit smoking.   Plan  Continue current medications  Depression / Anxiety   PHQ9  Score:  PHQ9 SCORE ONLY 09/18/2019 06/05/2019 03/05/2019  PHQ-9 Total Score _0 GAD7 Score: GAD 7 : Generalized Anxiety Score 09/18/2019 06/05/2019 03/05/2019 07/31/2018  Nervous, Anxious, on Edge _1 Control/stop worrying 0 _2 Worry too much - different things _3 Trouble relaxing 0 2 0 3  Restless 0 2 0 1  Easily annoyed or irritable _4 Afraid - awful might happen _5 Total GAD 7 Score _6 Anxiety Difficulty Not difficult at all Somewhat difficult Somewhat difficult Somewhat difficult    Patient has failed these meds in past:  Patient is currently controlled on  the following medications:   Clonazepam 1 mg bid  Citalopram 20 mg qd  We discussed:  Patient reports doing well on above regimen.   Plan  Continue current medications   Medication Management   Pt uses [facility] for all medications Uses pill box? Fiancee manages meds   Plan  Continue current medication management strategy    Follow up: 3 month phone visit  Junita Push. Kenton Kingfisher PharmD, [facility] [phone]

## 2019-10-18 ENCOUNTER — Other Ambulatory Visit: Payer: Self-pay | Admitting: *Deleted

## 2019-10-18 ENCOUNTER — Encounter: Payer: Self-pay | Admitting: Radiation Oncology

## 2019-10-18 ENCOUNTER — Ambulatory Visit
Admission: RE | Admit: 2019-10-18 | Discharge: 2019-10-18 | Disposition: A | Discharge status: Home / Self Care | Payer: Medicare Other | Source: Ambulatory Visit | Attending: Radiation Oncology | Admitting: Radiation Oncology

## 2019-10-18 ENCOUNTER — Other Ambulatory Visit: Payer: Self-pay

## 2019-10-18 VITALS — BP 130/77 | HR 102 | Temp 97.6°F | Wt 101.0 lb

## 2019-10-18 DIAGNOSIS — Z8521 Personal history of malignant neoplasm of larynx: Secondary | ICD-10-CM | POA: Diagnosis not present

## 2019-10-18 DIAGNOSIS — C760 Malignant neoplasm of head, face and neck: Secondary | ICD-10-CM

## 2019-10-18 NOTE — Progress Notes (Signed)
Radiation Oncology Follow up Note  Name: Kevin Nelson   Date:   10/18/2019 MRN:  [id number] DOB: June 30, 1950    This 69 y.o. 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  REFERRING PROVIDER: [name], NP  HPI: Patient is a 69 year old male actually his birthday today who is now seen out 4 months having completed concurrent chemoradiation for stage IV squamous cell carcinoma the supraglottic larynx seen today in routine follow-up he is doing well specifically denies any dysphagia head and neck pain.  He has some slight loss of taste still and continues to have some slight dysphagia with bread products.  He had a PET CT scan.  Back in July which I have reviewed showing complete resolution of hypermetabolic activity associated with the larynx he also had resolution of hypermetabolic left level 3 lymph nodes with no evidence of adenopathy in the neck.  He did have some small foci of metabolic activity upper abdomen which will be followed.  Patient is being followed by Dr. Richardson Landry and patient states his exams have shown no evidence of disease with only some slight inflammation.  COMPLICATIONS OF TREATMENT: none  FOLLOW UP COMPLIANCE: keeps appointments   PHYSICAL EXAM:  BP 130/77   Pulse (!) 102   Temp 97.6 F (36.4 C) (Tympanic)   Wt 101 lb (45.8 kg)   BMI 16.30 kg/m  No evidence of cervical or supraclavicular adenopathy.  Well-developed well-nourished patient in NAD. HEENT reveals PERLA, EOMI, discs not visualized.  Oral cavity is clear. No oral mucosal lesions are identified. Neck is clear without evidence of cervical or supraclavicular adenopathy. Lungs are clear to A&P. Cardiac examination is essentially unremarkable with regular rate and rhythm without murmur rub or thrill. Abdomen is benign with no organomegaly or masses noted. Motor sensory and DTR  levels are equal and symmetric in the upper and lower extremities. Cranial nerves II through XII are grossly intact. Proprioception is intact. No peripheral adenopathy or edema is identified. No motor or sensory levels are noted. Crude visual fields are within normal range.  RADIOLOGY RESULTS: PET scan reviewed compatible with above-stated findings  PLAN: Present time patient is doing well with no evidence of disease by PET/CT criteria and pleased with his overall progress.  I have asked to see him out 6 months for follow-up.  I have asked him to keep his follow-up appointments with Dr. Richardson Landry since he is able to perform endoscopy easily in his office to monitor his progress.  Patient knows to call at anytime with any concerns.  I would like to take this opportunity to thank you for allowing me to participate in the care of your patient.Noreene Filbert, MD

## 2019-10-19 ENCOUNTER — Telehealth: Payer: Self-pay | Admitting: *Deleted

## 2019-10-19 MED ORDER — OXYCODONE HCL 5 MG PO TABS
5.0000 mg | ORAL_TABLET | Freq: Three times a day (TID) | ORAL | 0 refills | Status: DC | PRN
Start: 2019-10-19 — End: 2019-11-16

## 2019-10-19 NOTE — Telephone Encounter (Signed)
I have called Kevin Nelson and she states that pt is getting dentures and they will probable get teeth pulled on 10/11. He got his port out so I will cancel the appt for port flushes. He does not want b12 inj. But the other issues she has is that the pet scan for July was not approved through insurance. I told her that I will ask the authorization staff about it and then let her know what info I was given.

## 2019-10-19 NOTE — Telephone Encounter (Signed)
Patient's sister called to request cancellation of patient appointments on 10/4 for port flush and B12 injection. She stated that patient does not want the B12 injections anymore.

## 2019-10-19 NOTE — Telephone Encounter (Signed)
Too soon to fill oxycontin. Will refill oxycodone.

## 2019-10-22 ENCOUNTER — Inpatient Hospital Stay: Payer: Medicare Other

## 2019-10-30 ENCOUNTER — Other Ambulatory Visit: Payer: Self-pay | Admitting: *Deleted

## 2019-10-30 MED ORDER — NALOXEGOL OXALATE 25 MG PO TABS
25.0000 mg | ORAL_TABLET | Freq: Every day | ORAL | 0 refills | Status: DC
Start: 2019-10-30 — End: 2019-11-30

## 2019-10-30 NOTE — Telephone Encounter (Signed)
Patient wanting to come off of his Citalopram and is asking if he can half his pills to come off of it. Please advise

## 2019-10-30 NOTE — Telephone Encounter (Signed)
Call returned to [facility] and advised that OK to taper off Citalopram

## 2019-10-30 NOTE — Telephone Encounter (Signed)
Yes he can do that. Taper himself off citalopram

## 2019-10-31 ENCOUNTER — Telehealth: Payer: Self-pay

## 2019-10-31 ENCOUNTER — Telehealth: Payer: Self-pay | Admitting: General Practice

## 2019-10-31 NOTE — Telephone Encounter (Signed)
  Chronic Care Management   Outreach Note  10/31/2019 Name: Kevin Nelson MRN: [id number] DOB: 1950/02/02  Referred by: [name], NP Reason for referral : Chronic Care Management (RNCM chronic disease Management and care coordination needs)   An unsuccessful telephone outreach was attempted today. The patient was referred to the case management team for assistance with care management and care coordination. the patient answered.  He had [place] offered to reschedule. The patient agreed.   Follow Up Plan: Telephone follow up appointment with care management team member scheduled for: 11-28-2019 at 4:15 pm  Dean, MSN, [facility] Mobile: [phone]

## 2019-11-05 ENCOUNTER — Telehealth: Payer: Self-pay

## 2019-11-11 ENCOUNTER — Other Ambulatory Visit: Payer: Self-pay | Admitting: Oncology

## 2019-11-12 ENCOUNTER — Encounter: Payer: Self-pay | Admitting: Nurse Practitioner

## 2019-11-12 DIAGNOSIS — R739 Hyperglycemia, unspecified: Secondary | ICD-10-CM

## 2019-11-14 ENCOUNTER — Telehealth: Payer: Self-pay | Admitting: Pharmacist

## 2019-11-14 ENCOUNTER — Ambulatory Visit: Payer: Medicare Other | Admitting: Licensed Clinical Social Worker

## 2019-11-14 DIAGNOSIS — J449 Chronic obstructive pulmonary disease, unspecified: Secondary | ICD-10-CM

## 2019-11-14 DIAGNOSIS — C61 Malignant neoplasm of prostate: Secondary | ICD-10-CM

## 2019-11-14 DIAGNOSIS — Z85828 Personal history of other malignant neoplasm of skin: Secondary | ICD-10-CM

## 2019-11-14 DIAGNOSIS — F411 Generalized anxiety disorder: Secondary | ICD-10-CM

## 2019-11-14 DIAGNOSIS — J9611 Chronic respiratory failure with hypoxia: Secondary | ICD-10-CM

## 2019-11-14 NOTE — Chronic Care Management (AMB) (Signed)
Chronic Care Management    Clinical Social Work Follow Up Note  11/14/2019 Name: Kevin Nelson MRN: [id number] DOB: 04/08/1950  Kevin Nelson is a 69 y.o. year old male who is a primary care patient of Cannady, [name], NP. The CCM team was consulted for assistance with [employer] .   Review of patient status, including review of consultants reports, other relevant assessments, and collaboration with appropriate care team members and the patient's provider was performed as part of comprehensive patient evaluation and provision of chronic care management services.    SDOH (Social Determinants of Health) assessments performed: Yes    Outpatient Encounter Medications as of 11/14/2019  Medication Sig Note  . citalopram (CELEXA) 20 MG tablet TAKE 1 TABLET(20 MG) BY MOUTH DAILY   . clonazePAM (KLONOPIN) 1 MG tablet TAKE 1 TABLET(1 MG) BY MOUTH TWICE DAILY AS NEEDED FOR ANXIETY   . COMBIVENT RESPIMAT 20-100 MCG/ACT AERS respimat INHALE 1 PUFF BY MOUTH INTO THE LUNGS EVERY 6 HOURS AS NEEDED   . Cyanocobalamin 1000 MCG/ML KIT Inject 1,000 mcg as directed every 30 (thirty) days.   . fluticasone (FLONASE) 50 MCG/ACT nasal spray SHAKE LIQUID AND USE 1 SPRAY IN EACH NOSTRIL TWICE DAILY   . ipratropium-albuterol (DUONEB) 0.5-2.5 (3) MG/3ML SOLN Take 3 mLs by nebulization every 6 (six) hours as needed. 10/15/2019: prn  . naloxegol oxalate (MOVANTIK) 25 MG TABS tablet Take 1 tablet (25 mg total) by mouth daily.   [name] oxyCODONE (OXY IR/ROXICODONE) 5 MG immediate release tablet Take 1 tablet (5 mg total) by mouth every 8 (eight) hours as needed for severe pain.   [name] oxyCODONE (OXYCONTIN) 10 mg 12 hr tablet Take 1 tablet (10 mg total) by mouth every 12 (twelve) hours.   . OXYGEN Inhale 3 L into the lungs daily.   . prochlorperazine (COMPAZINE) 10 MG tablet TAKE 1 TABLET(10 MG) BY MOUTH EVERY 6 HOURS AS NEEDED FOR NAUSEA OR VOMITING (Patient not taking: Reported on 09/18/2019)   . STIOLTO RESPIMAT 2.5-2.5  MCG/ACT AERS INHALE 2 PUFFS INTO THE LUNGS DAILY    No facility-administered encounter medications on file as of 11/14/2019.     Goals Addressed    .  SW-"I need more support. I'm anxious all the time" (pt-stated)        Current Barriers:  [name] Knowledge Deficits related to community resources that are available to him and within his area . Financial Constraints - managing health care expenses  . Health related barriers-cancerous larynx  Clinical Social Worker Clinical Goal(s):  [name] Over the next 90 days, patient will verbalize understanding of plan for gaining crisis support resource education and implementing appropriate self-care tools into his daily routine to improve overall self-care . Over the next 90 days, patient will work with [facility] LCSW to address needs related to increasing self-care and self-care education to cope with financial barriers and stressors.      Interventions: . Patient interviewed and appropriate assessments performed . Provided mental health counseling with regard to coping with daily stress. [name] LCSW provided education on deep breathing and relaxation techniques to implement into his daily routine to combat stressors. Patient admits that his anxiety continues but he has been challenging his negative thinking in order to gain a more positive perspective. Patient admits that he could benefit from socialization but due to [place] he has been staying at home and taking appropriate precautions. LCSW provided education on available mental health support resources and socialization opportunities within his community. [name]  Provided patient with information about low income housing resources that are available. However, currently Section 8 and Public Housing wait list is still closed.  [name] LCSW provided reflective listening and implemented appropriate interventions to help suppport patient and his emotional needs  . Discussed plans with patient for ongoing care management follow up  and provided patient with direct contact information for care management team . Advised patient to follow up with provided community resources as needed . Assisted patient/caregiver with obtaining information about health plan benefits . Patient confirms stable transportation to his upcoming PCP appointment next month. . Patient admits ongoing SOB. LCSW provided education on ways to induce the relaxation body response to help with deep breathing.  . Patient reports that his anxiety is "more manageable" but he decided to discontinue taking his celexa. Patient was adamant about not getting back on this medication. LCSW advised patient to contact [facility] if his anxiety or depression increase.   . Patient reports ongoing dryness of the mouth and dehydration. However, he is currently drinking pedialyte which has been helpful. Healthy self-care education provided.  . Evaluation of current status of Laryngeal cancer and treatment options. On 08/29/19, patient states that his cancer was successfully treated and he no longer has to receive chemotherapy.  . Patient reports that he continues to smoke only 5 cigarettes per day but desires to continue decreasing this intake. Positive reinforcement provided.  [name] LCSW reviewed all upcoming medical appointments with patient. Patient reports having [facility] on 11/[facility]/19 where he will remove the last 4 teeth and roots. Patient has already had 4 removed thus far.  . Patient reports that his dermatologist appointment for skin cancer is not until 12/22/19.  [name] Patient confirms that he and his spouse received the COVID vaccines. . Patient reports that he has successfully went from 88 lbs to 109 lbs but it has been tricky to eat with his dental issues and low appetite. Patient is no longer receiving chemo or radiation.    . Patient states that he bought himself a new motorcycle. He reports that riding his new bike has  increased his overall mood and socialization.   Patient Self Care Activities:  . Attends all scheduled provider appointments . Calls provider office for new concerns or questions  Please see past updates related to this goal by clicking on the "Past Updates" button in the selected goal       Follow Up Plan: SW will follow up with patient by phone over the next quarter  Eula Fried, [place], MSW, [email or web address] Phone: [phone]

## 2019-11-14 NOTE — Chronic Care Management (AMB) (Signed)
Chronic Care Management Pharmacy Assistant   Name: Kevin Nelson  MRN: [id number] DOB: [birth year]  Reason for Encounter: COPD Disease Management Call   Patient Questions:  1.  Have you seen any other providers since your last visit?    Yes, on 10-29-19 with  Angus Palms, DDS at [place].     2.  Any changes in your medicines or health?    Patient had three teeth removed on 10-29-19. Scheduled for another dental procedure to remove an additional three more teeth on 11-19-19.      PCP : [name], NP  Allergies:  No Known Allergies  Medications: Outpatient Encounter Medications as of 11/14/2019  Medication Sig Note  . citalopram (CELEXA) 20 MG tablet TAKE 1 TABLET(20 MG) BY MOUTH DAILY   . clonazePAM (KLONOPIN) 1 MG tablet TAKE 1 TABLET(1 MG) BY MOUTH TWICE DAILY AS NEEDED FOR ANXIETY   . COMBIVENT RESPIMAT 20-100 MCG/ACT AERS respimat INHALE 1 PUFF BY MOUTH INTO THE LUNGS EVERY 6 HOURS AS NEEDED   . Cyanocobalamin 1000 MCG/ML KIT Inject 1,000 mcg as directed every 30 (thirty) days.   . fluticasone (FLONASE) 50 MCG/ACT nasal spray SHAKE LIQUID AND USE 1 SPRAY IN EACH NOSTRIL TWICE DAILY   . ipratropium-albuterol (DUONEB) 0.5-2.5 (3) MG/3ML SOLN Take 3 mLs by nebulization every 6 (six) hours as needed. 10/15/2019: prn  . naloxegol oxalate (MOVANTIK) 25 MG TABS tablet Take 1 tablet (25 mg total) by mouth daily.   [name] oxyCODONE (OXY IR/ROXICODONE) 5 MG immediate release tablet Take 1 tablet (5 mg total) by mouth every 8 (eight) hours as needed for severe pain.   [name] oxyCODONE (OXYCONTIN) 10 mg 12 hr tablet Take 1 tablet (10 mg total) by mouth every 12 (twelve) hours.   . OXYGEN Inhale 3 L into the lungs daily.   . prochlorperazine (COMPAZINE) 10 MG tablet TAKE 1 TABLET(10 MG) BY MOUTH EVERY 6 HOURS AS NEEDED FOR NAUSEA OR VOMITING (Patient not taking: Reported on 09/18/2019)   . STIOLTO RESPIMAT 2.5-2.5 MCG/ACT AERS INHALE 2 PUFFS INTO  THE LUNGS DAILY    No facility-administered encounter medications on file as of 11/14/2019.    Current Diagnosis: Patient Active Problem List   Diagnosis Date Noted  . Hypophosphatemia 04/30/2019  . Goals of care, counseling/discussion 01/25/2019  . Squamous cell carcinoma of larynx ([place]) 01/04/2019  . Numbness of left foot 12/21/2018  . Chronic nonintractable headache 11/10/2018  . Sensorineural hearing loss (SNHL) 08/21/2018  . Coronary atherosclerosis 07/31/2018  . Chronic hepatitis ([place]) 04/21/2018  . Lung nodule < 6cm on CT 04/19/2018  . Senile purpura ([place]) 04/19/2018  . Protein-calorie malnutrition ([place]) 04/19/2018  . Nicotine dependence, cigarettes, uncomplicated 63/14/9702  . Controlled substance agreement signed 01/27/2018  . COPD, very severe ([place]) 01/13/2018  . Prostate cancer ([place]) 01/13/2018  . History of basal cell carcinoma (BCC) 01/13/2018  . Generalized anxiety disorder 01/13/2018    Goals Addressed   None     COPD Disease Management Review:   . Current COPD regimen: o O2 at 3L via nasal cannula. o Stiolto Respimat 2.5-2.5 MCG/ACT: Inhale 2 puffs into the lungs daily.  o Ipratropium-Albuterol (Duoneb) 0.5-2.5 (3) MG/ML Solution: Take 3 mls of nebulization every 6 hours as needed.  o Combivent Respimat 20-100 MCG/ACT: Inhale 1 puff by mouth into the lungs every 6 hours as needed.   . No flowsheet data found.  . Any recent hospitalizations or ED visits since last  visit with CPP? No  . Reports COPD symptoms, including Symptoms worse with exercise.  . Patient explained he has been dealing with his COPD for sometime now.  o Reported no new or acute symptoms, but stated "if I lean over for to long or walk for to long I do become short of breath, but this has been going on. ". Patient reported then when these shortness of breath events occur he will stop what he is doing to "catch his breath" or use in rescue inhaler.   . What recent interventions/DTPs have  been made by any provider to improve breathing since last visit: o None reported by patient or noted in the chart.   . Have you had exacerbation/flare-up since last visit? No  . What do you do when you are short of breath?  Rescue medication and Rest  Respiratory Devices/Equipment . Do you have a nebulizer? o  Yes/ Ipratropium-Albuterol (Duoneb) 0.5-2.5 (3) MG/ML Solution: Directions: take 3 mls of nebulization every 6 hours as needed.  o Patient reported he uses his nebulizer at least twice daily for shortness of breath. Patient reported no adverse reactions to this medication.   . Do you use a Peak Flow Meter? No  . Do you use a maintenance inhaler? Yes/ Stiolto Respimat 2.5-2.5 MCG/ACT: Inhale 2 puffs into the lungs daily.   . How often do you forget to use your daily inhaler? Never o Patient expressed he never forgets to use inhaler because he understands he "needs" it to breath. Reported no adverse reactions to this medication.   . Do you use a rescue inhaler? Yes/ Combivent Respimat 20-100 MCG/ACT: Inhale 1 puff by mouth into the lungs every 6 hours as needed.   . How often do you use your rescue inhaler?  multiple times per day. o Patient reported he always carries his rescue inhaler with him. Reported no adverse reactions to this medication.   . Do you use a spacer with your inhaler? No  Adherence Review: . Does the patient have >5 day gap between last estimated fill date for maintenance inhaler medications? No.  11-14-19: Called patient to discuss the management of his COPD. The patient was cooperative and pleasant during the phone conversation. Reported he had "three teeth removed" on 10-29-19. Explained he is finally starting to feel "somewhat" better. Reported he has another appointment with his dentist on 11-19-19 to have three more additional teeth removed. The patient explained, " I'm just ready to get rid of my bad teeth so I can finally order my bottom denture." Expressed  he has a dermatology appointment related to skin cancer removal on 12-20-2019.   The patient reported he does not wear his 02 at all times. Reported he does not wear it when he is riding his motorcycle or completing small errands. The patient did explain that his small portable 02 tank is always with him and that he also carries a pulse oximeter at all times. Explained he does wear his 02 at night, which helps him sleep. Patient-reported he has one at-home O2 concentrator and several small 02 containers for his portable bag and in the event, the power does off.   No acute medication needs were reported. The patient reported no financial barriers to obtaining his medications or medical equipment needs such as his oxygen.   Reminded patient about his follow-up call with Birdena Crandall, [facility] on 12/24/2019 at 2:00 pm. The patient confirmed the appointment date and time.    Follow-Up:  Pharmacist Review to review COPD disease management call. CPA to reach out to patient during November 2021 (patient made aware and verbalized agreement).   A scheduled follow-up visit with CPP on 12-24-19 at 2:00 pm.   Cloretta Ned, LPN Clinical Pharmacist Assistant  [phone]

## 2019-11-16 ENCOUNTER — Other Ambulatory Visit: Payer: Self-pay | Admitting: *Deleted

## 2019-11-16 MED ORDER — OXYCODONE HCL ER 10 MG PO T12A
10.0000 mg | EXTENDED_RELEASE_TABLET | Freq: Two times a day (BID) | ORAL | 0 refills | Status: DC
Start: 2019-11-16 — End: 2019-12-17

## 2019-11-16 MED ORDER — OXYCODONE HCL 5 MG PO TABS
5.0000 mg | ORAL_TABLET | Freq: Three times a day (TID) | ORAL | 0 refills | Status: DC | PRN
Start: 2019-11-16 — End: 2019-12-17

## 2019-11-20 ENCOUNTER — Ambulatory Visit: Payer: Self-pay

## 2019-11-20 NOTE — Telephone Encounter (Signed)
Noted  
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
## 2019-11-20 NOTE — Telephone Encounter (Signed)
Please alert him very full schedule today and unable to fit in.  I do highly recommend he head to [facility] walk-in, will need imaging as I am concerned for fracture and ortho could assist if fracture present -- I highly recommend he head there today.

## 2019-11-20 NOTE — Telephone Encounter (Signed)
Called pt spoke with patti she states that pt has medicare and she doesn't think [facility] excepts his ins pt scheduled for tomorrow

## 2019-11-21 ENCOUNTER — Other Ambulatory Visit: Payer: Self-pay

## 2019-11-21 ENCOUNTER — Ambulatory Visit: Payer: Medicare Other | Admitting: Nurse Practitioner

## 2019-11-21 DIAGNOSIS — K739 Chronic hepatitis, unspecified: Secondary | ICD-10-CM | POA: Diagnosis not present

## 2019-11-21 DIAGNOSIS — J449 Chronic obstructive pulmonary disease, unspecified: Secondary | ICD-10-CM | POA: Diagnosis not present

## 2019-11-21 DIAGNOSIS — S99922A Unspecified injury of left foot, initial encounter: Secondary | ICD-10-CM | POA: Diagnosis not present

## 2019-11-21 DIAGNOSIS — M7989 Other specified soft tissue disorders: Secondary | ICD-10-CM | POA: Diagnosis not present

## 2019-11-21 DIAGNOSIS — C61 Malignant neoplasm of prostate: Secondary | ICD-10-CM | POA: Diagnosis not present

## 2019-11-21 DIAGNOSIS — S99912A Unspecified injury of left ankle, initial encounter: Secondary | ICD-10-CM | POA: Diagnosis not present

## 2019-11-21 MED ORDER — CLONAZEPAM 1 MG PO TABS
ORAL_TABLET | ORAL | 2 refills | Status: DC
Start: 2019-11-21 — End: 2020-01-28

## 2019-11-21 NOTE — Telephone Encounter (Signed)
Called pt and advised rx has been sent to pharmacy. Pt verbalized understanding.

## 2019-11-21 NOTE — Telephone Encounter (Signed)
Patient last seen 09/18/19 and has appointment 12/19/19

## 2019-11-21 NOTE — Telephone Encounter (Signed)
Pt's spouse called in to follow up on refill request. Advised of current status, expressed understanding.    Please assist.

## 2019-11-26 ENCOUNTER — Other Ambulatory Visit: Payer: Self-pay | Admitting: Nurse Practitioner

## 2019-11-26 ENCOUNTER — Other Ambulatory Visit: Payer: Self-pay

## 2019-11-26 ENCOUNTER — Inpatient Hospital Stay: Payer: Medicare Other

## 2019-11-26 ENCOUNTER — Other Ambulatory Visit: Payer: Medicare Other | Admitting: Adult Health Nurse Practitioner

## 2019-11-26 DIAGNOSIS — C329 Malignant neoplasm of larynx, unspecified: Secondary | ICD-10-CM

## 2019-11-26 DIAGNOSIS — Z515 Encounter for palliative care: Secondary | ICD-10-CM | POA: Diagnosis not present

## 2019-11-26 DIAGNOSIS — J449 Chronic obstructive pulmonary disease, unspecified: Secondary | ICD-10-CM | POA: Diagnosis not present

## 2019-11-26 DIAGNOSIS — J430 Unilateral pulmonary emphysema [MacLeod's syndrome]: Secondary | ICD-10-CM | POA: Diagnosis not present
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
## 2019-11-27 ENCOUNTER — Telehealth: Payer: Self-pay | Admitting: Pulmonary Disease

## 2019-11-27 NOTE — Telephone Encounter (Signed)
Patient is past due for an appointment. Last seen 12/2018 and was instructed to f/u in 73mo.    Spoke to patient's spouse, Patti(DPR). [name] stated that home health nurse came by yesrerday morning and recommended that patient contact our office.  Patient is experiencing productive cough with clear to pale green sputum and increased sob. Rapid HR of 155 yesterday. HR went down to 80 with recheck.  On 4L cont. spo2 was 99% yesterday morning.  Patient does not have pulse ox.  Currently on amoxicillin 500mg  for denial procedure. He has been on amoxicillin since august and will continue for 2 more weeks  Patient is using duoneb BID and Stiolto daily.  Denied fever, chills or sweats.  He has both covid vaccines.   Dr. Patsey Berthold, please advise. Thanks

## 2019-11-27 NOTE — Telephone Encounter (Signed)
Given the issues with his heart rate and how unstable he is I would recommend that he go to the emergency room for evaluation.  He may need to be admitted for COPD exacerbation.

## 2019-11-27 NOTE — Telephone Encounter (Signed)
Patient's spouse, Patti(DPR) is aware of below recommendations and voiced her understanding.  appt scheduled for 12/05/2019 at 10:30, as patient is past due for OV. Nothing further needed at this time.

## 2019-11-28 ENCOUNTER — Telehealth: Payer: Self-pay

## 2019-11-28 ENCOUNTER — Telehealth: Payer: Self-pay | Admitting: General Practice

## 2019-11-28 DIAGNOSIS — S92322D Displaced fracture of second metatarsal bone, left foot, subsequent encounter for fracture with routine healing: Secondary | ICD-10-CM | POA: Diagnosis not present

## 2019-11-28 DIAGNOSIS — S92342D Displaced fracture of fourth metatarsal bone, left foot, subsequent encounter for fracture with routine healing: Secondary | ICD-10-CM | POA: Diagnosis not present

## 2019-11-28 DIAGNOSIS — S92332D Displaced fracture of third metatarsal bone, left foot, subsequent encounter for fracture with routine healing: Secondary | ICD-10-CM | POA: Diagnosis not present

## 2019-11-28 NOTE — Telephone Encounter (Signed)
Called pt no answer vm not set up. His appt today will be a phone call only   Copied from [place] [id number]. Topic: Appointment Scheduling - Scheduling Inquiry for Clinic >> Nov 28, 2019 10:28 AM Oneta Rack wrote: Reason for CRM: Patient returning call confirmed appointment and conducted COVID Screening for 11/29/2019 appointment. Patient unclear about 4:15 telephone visit scheduled today and would like some clarity on what to expect.

## 2019-11-28 NOTE — Telephone Encounter (Signed)
  Chronic Care Management   Outreach Note  11/28/2019 Name: Bodey Frizell MRN: [id number] DOB: 1950/08/19  Referred by: [name], NP Reason for referral : Chronic Care Management (RNCM Follow up call for Chronic Disease Management and Care Coordination Needs)   An unsuccessful telephone outreach was attempted today. The patient was referred to the case management team for assistance with care management and care coordination.   Follow Up Plan: The care management team will reach out to the patient again over the next 30 to 60 days.   Noreene Larsson RN, MSN, [facility] Mobile: [phone]

## 2019-11-29 ENCOUNTER — Other Ambulatory Visit: Payer: Self-pay

## 2019-11-29 ENCOUNTER — Ambulatory Visit (INDEPENDENT_AMBULATORY_CARE_PROVIDER_SITE_OTHER): Payer: Medicare Other | Admitting: Nurse Practitioner

## 2019-11-29 ENCOUNTER — Encounter: Payer: Self-pay | Admitting: Nurse Practitioner

## 2019-11-29 VITALS — BP 109/69 | HR 87 | Temp 97.9°F | Wt 101.6 lb

## 2019-11-29 DIAGNOSIS — D692 Other nonthrombocytopenic purpura: Secondary | ICD-10-CM | POA: Diagnosis not present

## 2019-11-29 DIAGNOSIS — C329 Malignant neoplasm of larynx, unspecified: Secondary | ICD-10-CM | POA: Diagnosis not present

## 2019-11-29 DIAGNOSIS — F17210 Nicotine dependence, cigarettes, uncomplicated: Secondary | ICD-10-CM | POA: Diagnosis not present

## 2019-11-29 DIAGNOSIS — R739 Hyperglycemia, unspecified: Secondary | ICD-10-CM | POA: Diagnosis not present

## 2019-11-29 DIAGNOSIS — E538 Deficiency of other specified B group vitamins: Secondary | ICD-10-CM

## 2019-11-29 DIAGNOSIS — E43 Unspecified severe protein-calorie malnutrition: Secondary | ICD-10-CM | POA: Diagnosis not present

## 2019-11-29 DIAGNOSIS — E611 Iron deficiency: Secondary | ICD-10-CM

## 2019-11-29 DIAGNOSIS — R000 Tachycardia, unspecified: Secondary | ICD-10-CM | POA: Diagnosis not present

## 2019-11-29 DIAGNOSIS — I2510 Atherosclerotic heart disease of native coronary artery without angina pectoris: Secondary | ICD-10-CM

## 2019-11-29 DIAGNOSIS — J449 Chronic obstructive pulmonary disease, unspecified: Secondary | ICD-10-CM

## 2019-11-29 DIAGNOSIS — D519 Vitamin B12 deficiency anemia, unspecified: Secondary | ICD-10-CM | POA: Diagnosis not present

## 2019-11-29 DIAGNOSIS — F411 Generalized anxiety disorder: Secondary | ICD-10-CM

## 2019-11-29 LAB — BAYER DCA HB A1C WAIVED: HB A1C (BAYER DCA - WAIVED): 5 % (ref <7.0)

## 2019-11-29 LAB — URINALYSIS, ROUTINE W REFLEX MICROSCOPIC
Bilirubin, UA: NEGATIVE
Glucose, UA: NEGATIVE
Ketones, UA: NEGATIVE
Leukocytes,UA: NEGATIVE
Nitrite, UA: NEGATIVE
Protein,UA: NEGATIVE
RBC, UA: NEGATIVE
Specific Gravity, UA: 1.015 (ref 1.005–1.030)
Urobilinogen, Ur: 0.2 mg/dL (ref 0.2–1.0)
pH, UA: 6.5 (ref 5.0–7.5)

## 2019-11-29 MED ORDER — PREDNISONE 20 MG PO TABS: 40.0000 mg | ORAL_TABLET | Freq: Every day | ORAL | 0 refills | Status: AC

## 2019-11-29 NOTE — Assessment & Plan Note (Signed)
Ongoing.  Continue collaboration with oncology and appreciate their input.  Recent notes reviewed.

## 2019-11-29 NOTE — Assessment & Plan Note (Signed)
Ongoing after cancer treatments.  Continue collaboration with oncology and palliative.  Recommend drinking supplements TID.

## 2019-11-29 NOTE — Progress Notes (Signed)
BP 109/69   Pulse 87   Temp 97.9 F (36.6 C)   Wt 101 lb 9.6 oz (46.1 kg)   SpO2 (!) 89%   BMI 16.40 kg/m    Subjective:    Patient ID: Kevin Nelson, male    DOB: November 29, 1950, 69 y.o.   MRN: [id number]  HPI: Kevin Nelson is a 69 y.o. male  Chief Complaint  Patient presents with  . COPD    pt states he has been having shortness of breath, it has been getting worse over time. Pt states it is affecting his sleep   . Tachycardia    pt states his homehealth care nurse told him she didn't think his left lung was clear, as well as rapid heart rate    COPD Followed by pulmonary, last saw at [facility] on 02/19/2019, Dr. Wynonia Musty -- returns to see pulmonary on 17th.  Continues Combivent, Stiolto, and Duoneb.  Has diagnosis of laryngeal cancer, being followed by oncology and palliative team and receiving treatments with last infusion on 05/31/19 -- last saw Dr. Janese Banks 09/20/19.  Had a feeding tube in place, which was discontinued on 08/31/19.    He reports at this time that home health report his left lung did not sound clear and his HR was rapid recently (155) for 2 seconds then started dramatically coming down and tried different equipment and it was 80 something.  He does endorse some SOB, which has been going on 3 days and coughing up a little bit more phlegm.  Has had Covid vaccines, last August.  His significant other reports he hasn't got as much energy -- for a little while.    Continues to smoke about 8 cigarettes a day, not interested in quitting.   COPD status: stable Satisfied with current treatment?: yes Oxygen use: yes Dyspnea frequency: occasional Cough frequency: chronic Rescue inhaler frequency:  3-4 times a week Limitation of activity: at times Productive cough: none Last Spirometry: with pulmonary Pneumovax: refused  Influenza: Up To Date  ANXIETY/STRESS Followed by palliative team at this time due to co morbidities and laryngeal CA.  Is taking Klonopin 1 MG BID PRN, last fill on  11/21/19 on PDMP review -- taking one a day Klonopin at this time.  He reports benefit from this regimen.  Risks, benefits, side effects and alternative therapies were discussed.  The opportunity to ask questions was provided and they were answered to the best of my ability.  The patient expressed understanding and willingness to follow the outlined treatment protocols and was able to verbalize plan of care back to provider.  He also takes Oxycodone 10 MG as needed, prescribed by palliative NP with oncology -- filled 10/29/21and Oxycontin filled last 11/16/19.   Duration:stable Anxious mood: yes  Excessive worrying: yes Irritability: no  Sweating: no Nausea: no Palpitations:no Hyperventilation: no Panic attacks: no Agoraphobia: no  Obscessions/compulsions: no Depressed mood: yes Depression screen [facility] 2/9 09/18/2019 06/05/2019 03/05/2019 01/10/2019  Decreased Interest 0 1 3 0  Down, Depressed, Hopeless 0 1 3 1   PHQ - 2 Score 0 2 6 1   Altered sleeping 0 3 3 -  Tired, decreased energy 1 1 3  -  Change in appetite 2 3 1  -  Feeling bad or failure about yourself  0 0 0 -  Trouble concentrating 0 1 0 -  Moving slowly or fidgety/restless 0 2 0 -  Suicidal thoughts 0 0 1 -  PHQ-9 Score 3 12 14  -  Difficult doing work/chores Not  difficult at all Not difficult at all Somewhat difficult -  Some recent data might be hidden   Anhedonia: no Weight changes: no Insomnia: yes hard to fall asleep  Hypersomnia: no Fatigue/loss of energy: no Feelings of worthlessness: no Feelings of guilt: no Impaired concentration/indecisiveness: yes Suicidal ideations: no  Crying spells: no Recent Stressors/Life Changes: no   Relationship problems: no   Family stress: no     Financial stress: no    Job stress: no    Recent death/loss: no GAD 7 : Generalized Anxiety Score 09/18/2019 06/05/2019 03/05/2019 07/31/2018  Nervous, Anxious, on Edge 1 3 3 3   Control/stop worrying 0 2 3 3   Worry too much - different  things 1 2 3 3   Trouble relaxing 0 2 0 3  Restless 0 2 0 1  Easily annoyed or irritable 1 2 1 3   Afraid - awful might happen 1 2 1 3   Total GAD 7 Score 4 15 11 19   Anxiety Difficulty Not difficult at all Somewhat difficult Somewhat difficult Somewhat difficult    Relevant past medical, surgical, family and social history reviewed and updated as indicated. Interim medical history since our last visit reviewed. Allergies and medications reviewed and updated.  Review of Systems  Constitutional: Negative for activity change, diaphoresis, fatigue and fever.  Respiratory: Positive for cough (baseline) and wheezing (baseline), SOB. Negative for chest tightness and shortness of breath.   Cardiovascular: Negative for chest pain, palpitations and leg swelling.  Gastrointestinal: Negative.   Psychiatric/Behavioral: Negative.     Per HPI unless specifically indicated above     Objective:    BP 109/69   Pulse 87   Temp 97.9 F (36.6 C)   Wt 101 lb 9.6 oz (46.1 kg)   SpO2 (!) 89%   BMI 16.40 kg/m   Wt Readings from Last 3 Encounters:  11/29/19 101 lb 9.6 oz (46.1 kg)  10/18/19 101 lb (45.8 kg)  10/08/19 103 lb (46.7 kg)    Physical Exam Vitals and nursing note reviewed.  Constitutional:      General: He is awake. He is not in acute distress.    Appearance: He is well-developed and well-groomed. He is       underweight. He is not ill-appearing.     Comments: Frail appearing.  HENT:     Head: Normocephalic and atraumatic. Edentulous.    Right Ear: Hearing normal. No drainage.     Left Ear: Hearing normal. No drainage.  Eyes:     General: Lids are normal.        Right eye: No discharge.        Left eye: No discharge.     Conjunctiva/sclera: Conjunctivae normal.     Pupils: Pupils are equal, round, and reactive to light.  Neck:     Vascular: No carotid bruit.  Cardiovascular:     Rate and Rhythm: Normal rate and regular rhythm.     Heart sounds: Normal heart sounds, S1 normal  and S2 normal. No murmur. No gallop.   Pulmonary:     Effort: Pulmonary effort is normal. No accessory muscle usage or respiratory distress.     Breath sounds: Decreased breath sounds present.     Comments: Decreased breath sounds throughout, with intermittent wheezing.  No O2 on today. Abdominal:     General: Bowel sounds are normal.     Palpations: Abdomen is soft.  Musculoskeletal:        General: Normal range of motion.  Cervical back: Normal range of motion and neck supple.     Right lower leg: No edema.     Left lower leg: No edema.  Skin:    General: Skin is warm and dry.     Comments: Scattered small pale purple bruises bilateral upper extremities.  Neurological:     Mental Status: He is alert and oriented to person, place, and time.     Deep Tendon Reflexes: Reflexes are normal and symmetric.  Psychiatric:        Attention and Perception: Attention normal.        Mood and Affect: Mood normal.        Speech: Speech normal.        Behavior: Behavior normal. Behavior is cooperative.        Thought Content: Thought content normal.   EKG in office today with normal axis, Rate 78, LBBB noted in V1 and V2.  Results for orders placed or performed in visit on 11/29/19  Bayer DCA Hb A1c Waived  Result Value Ref Range   HB A1C (BAYER DCA - WAIVED) 5.0 <7.0 %  Urinalysis, Routine w reflex microscopic  Result Value Ref Range   Specific Gravity, UA 1.015 1.005 - 1.030   pH, UA 6.5 5.0 - 7.5   Color, UA Yellow Yellow   Appearance Ur Clear Clear   Leukocytes,UA Negative Negative   Protein,UA Negative Negative/Trace   Glucose, UA Negative Negative   Ketones, UA Negative Negative   RBC, UA Negative Negative   Bilirubin, UA Negative Negative   Urobilinogen, Ur 0.2 0.2 - 1.0 mg/dL   Nitrite, UA Negative Negative      Assessment & Plan:   Problem List Items Addressed This Visit      Cardiovascular and Mediastinum   Senile purpura (HCC)    In presence of severe COPD and  protein calorie malnutrition.  Discussed proper skin care, with gentle cleansing and use of Lubriderm lotion daily.  Monitor for skin breakdown and alert provider if present.        Respiratory   COPD, very severe ([place]) - Primary    Chronic, ongoing with O2 dependence.  Continue current medication regimen and collaboration with pulmonology.  He declines any imaging today to further assess lungs, reports he will have done when he sees pulmonary next week if needed.  At this time will send in burst of Prednisone to see if benefit to SOB and recommend he wear O2 consistently. Continue current abx regimen, Amoxicillin.  Recommend complete smoking cessation, although not interested in this at this time.  CBC, TSH, CMP today.  Return in 8 weeks.      Relevant Medications   predniSONE (DELTASONE) 20 MG tablet   Squamous cell carcinoma of larynx (HCC)    Ongoing.  Continue collaboration with oncology and appreciate their input.  Recent notes reviewed.      Relevant Medications   amoxicillin (AMOXIL) 500 MG capsule   predniSONE (DELTASONE) 20 MG tablet     Other   Generalized anxiety disorder    Chronic, ongoing.  Denies SI/HI.  Will continue current palliative regimen.  Patient aware of long term risks of benzos, wishes to continue at this time.  Recent refills sent.  Continue to collaborate with palliative team.  Return in 3 months.      Nicotine dependence, cigarettes, uncomplicated    I have recommended complete cessation of tobacco use. I have discussed various options available for assistance with tobacco cessation including  over the counter methods (Nicotine gum, patch and lozenges). We also discussed prescription options (Chantix, Nicotine Inhaler / Nasal Spray). The patient is not interested in pursuing any prescription tobacco cessation options at this time.       Protein-calorie malnutrition ([place])    Ongoing after cancer treatments.  Continue collaboration with oncology and  palliative.  Recommend drinking supplements TID.      Racing heart beat    X 1 episode with home healthy nurse, ?equipment issue as normal on assessment with other equipment per patient report.  HR NSR in office today.  Will check labs today and continue to monitor.  He is asymptomatic.  If an symptoms present or tachycardia consider referral to cardiology.      Relevant Orders   Comprehensive metabolic panel   TSH   Bayer DCA Hb A1c Waived (Completed)   EKG 12-Lead (Completed)   B12 deficiency    Ongoing, recheck level today and restart supplement as needed.      Relevant Orders   B12   CBC with Differential/Platelet    Other Visit Diagnoses    Iron deficiency       History of low levels, recheck today and initiate supplement as needed.   Relevant Orders   Iron, TIBC and Ferritin Panel   CBC with Differential/Platelet   Urinalysis, Routine w reflex microscopic (Completed)       Follow up plan: Return in about 8 weeks (around 01/24/2020) for COPD .

## 2019-11-29 NOTE — Assessment & Plan Note (Signed)
X 1 episode with home healthy nurse, ?equipment issue as normal on assessment with other equipment per patient report.  HR NSR in office today.  Will check labs today and continue to monitor.  He is asymptomatic.  If an symptoms present or tachycardia consider referral to cardiology.

## 2019-11-29 NOTE — Assessment & Plan Note (Addendum)
Chronic, ongoing with O2 dependence.  Continue current medication regimen and collaboration with pulmonology.  He declines any imaging today to further assess lungs, reports he will have done when he sees pulmonary next week if needed.  At this time will send in burst of Prednisone to see if benefit to SOB and recommend he wear O2 consistently. Continue current abx regimen, Amoxicillin.  Recommend complete smoking cessation, although not interested in this at this time.  CBC, TSH, CMP today.  Return in 8 weeks.

## 2019-11-29 NOTE — Assessment & Plan Note (Signed)
I have recommended complete cessation of tobacco use. I have discussed various options available for assistance with tobacco cessation including over the counter methods (Nicotine gum, patch and lozenges). We also discussed prescription options (Chantix, Nicotine Inhaler / Nasal Spray). The patient is not interested in pursuing any prescription tobacco cessation options at this time.  

## 2019-11-29 NOTE — Patient Instructions (Signed)
Chronic Obstructive Pulmonary Disease Chronic obstructive pulmonary disease (COPD) is a long-term (chronic) lung problem. When you have COPD, it is hard for air to get in and out of your lungs. Usually the condition gets worse over time, and your lungs will never return to normal. There are things you can do to keep yourself as healthy as possible.  Your doctor may treat your condition with: ? Medicines. ? Oxygen. ? Lung surgery.  Your doctor may also recommend: ? Rehabilitation. This includes steps to make your body work better. It may involve a team of specialists. ? Quitting smoking, if you smoke. ? Exercise and changes to your diet. ? Comfort measures (palliative care). Follow these instructions at home: Medicines  Take over-the-counter and prescription medicines only as told by your doctor.  Talk to your doctor before taking any cough or allergy medicines. You may need to avoid medicines that cause your lungs to be dry. Lifestyle  If you smoke, stop. Smoking makes the problem worse. If you need help quitting, ask your doctor.  Avoid being around things that make your breathing worse. This may include smoke, chemicals, and fumes.  Stay active, but remember to rest as well.  Learn and use tips on how to relax.  Make sure you get enough sleep. Most adults need at least 7 hours of sleep every night.  Eat healthy foods. Eat smaller meals more often. Rest before meals. Controlled breathing Learn and use tips on how to control your breathing as told by your doctor. Try:  Breathing in (inhaling) through your nose for 1 second. Then, pucker your lips and breath out (exhale) through your lips for 2 seconds.  Putting one hand on your belly (abdomen). Breathe in slowly through your nose for 1 second. Your hand on your belly should move out. Pucker your lips and breathe out slowly through your lips. Your hand on your belly should move in as you breathe out.  Controlled coughing Learn  and use controlled coughing to clear mucus from your lungs. Follow these steps: 1. Lean your head a little forward. 2. Breathe in deeply. 3. Try to hold your breath for 3 seconds. 4. Keep your mouth slightly open while coughing 2 times. 5. Spit any mucus out into a tissue. 6. Rest and do the steps again 1 or 2 times as needed. General instructions  Make sure you get all the shots (vaccines) that your doctor recommends. Ask your doctor about a flu shot and a pneumonia shot.  Use oxygen therapy and pulmonary rehabilitation if told by your doctor. If you need home oxygen therapy, ask your doctor if you should buy a tool to measure your oxygen level (oximeter).  Make a COPD action plan with your doctor. This helps you to know what to do if you feel worse than usual.  Manage any other conditions you have as told by your doctor.  Avoid going outside when it is very hot, cold, or humid.  Avoid people who have a sickness you can catch (contagious).  Keep all follow-up visits as told by your doctor. This is important. Contact a doctor if:  You cough up more mucus than usual.  There is a change in the color or thickness of the mucus.  It is harder to breathe than usual.  Your breathing is faster than usual.  You have trouble sleeping.  You need to use your medicines more often than usual.  You have trouble doing your normal activities such as getting dressed   or walking around the house. Get help right away if:  You have shortness of breath while resting.  You have shortness of breath that stops you from: ? Being able to talk. ? Doing normal activities.  Your chest hurts for longer than 5 minutes.  Your skin color is more blue than usual.  Your pulse oximeter shows that you have low oxygen for longer than 5 minutes.  You have a fever.  You feel too tired to breathe normally. Summary  Chronic obstructive pulmonary disease (COPD) is a long-term lung problem.  The way your  lungs work will never return to normal. Usually the condition gets worse over time. There are things you can do to keep yourself as healthy as possible.  Take over-the-counter and prescription medicines only as told by your doctor.  If you smoke, stop. Smoking makes the problem worse. This information is not intended to replace advice given to you by your health care provider. Make sure you discuss any questions you have with your health care provider. Document Revised: 12/17/2016 Document Reviewed: 02/09/2016 Elsevier Patient Education  2020 [employer]vier Inc.  

## 2019-11-29 NOTE — Assessment & Plan Note (Signed)
Chronic, ongoing.  Denies SI/HI.  Will continue current palliative regimen.  Patient aware of long term risks of benzos, wishes to continue at this time.  Recent refills sent.  Continue to collaborate with palliative team.  Return in 3 months.

## 2019-11-29 NOTE — Assessment & Plan Note (Signed)
In presence of severe COPD and protein calorie malnutrition.  Discussed proper skin care, with gentle cleansing and use of Lubriderm lotion daily.  Monitor for skin breakdown and alert provider if present. 

## 2019-11-29 NOTE — Assessment & Plan Note (Signed)
Ongoing, recheck level today and restart supplement as needed.

## 2019-11-30 ENCOUNTER — Other Ambulatory Visit: Payer: Self-pay | Admitting: *Deleted

## 2019-11-30 ENCOUNTER — Other Ambulatory Visit: Payer: Self-pay | Admitting: Nurse Practitioner

## 2019-11-30 LAB — CBC WITH DIFFERENTIAL/PLATELET
Basophils Absolute: 0.1 10*3/uL (ref 0.0–0.2)
Basos: 1 %
EOS (ABSOLUTE): 0.2 10*3/uL (ref 0.0–0.4)
Eos: 4 %
Hematocrit: 36.5 % — ABNORMAL LOW (ref 37.5–51.0)
Hemoglobin: 11.9 g/dL — ABNORMAL LOW (ref 13.0–17.7)
Immature Grans (Abs): 0 10*3/uL (ref 0.0–0.1)
Immature Granulocytes: 0 %
Lymphocytes Absolute: 1 10*3/uL (ref 0.7–3.1)
Lymphs: 22 %
MCH: 28.7 pg (ref 26.6–33.0)
MCHC: 32.6 g/dL (ref 31.5–35.7)
MCV: 88 fL (ref 79–97)
Monocytes Absolute: 0.8 10*3/uL (ref 0.1–0.9)
Monocytes: 18 %
Neutrophils Absolute: 2.6 10*3/uL (ref 1.4–7.0)
Neutrophils: 55 %
Platelets: 302 10*3/uL (ref 150–450)
RBC: 4.14 x10E6/uL (ref 4.14–5.80)
RDW: 14.8 % (ref 11.6–15.4)
WBC: 4.7 10*3/uL (ref 3.4–10.8)

## 2019-11-30 LAB — IRON,TIBC AND FERRITIN PANEL
Ferritin: 40 ng/mL (ref 30–400)
Iron Saturation: 18 % (ref 15–55)
Iron: 53 ug/dL (ref 38–169)
Total Iron Binding Capacity: 297 ug/dL (ref 250–450)
UIBC: 244 ug/dL (ref 111–343)

## 2019-11-30 LAB — COMPREHENSIVE METABOLIC PANEL
ALT: 9 IU/L (ref 0–44)
AST: 16 IU/L (ref 0–40)
Albumin/Globulin Ratio: 1.8 (ref 1.2–2.2)
Albumin: 4.1 g/dL (ref 3.8–4.8)
Alkaline Phosphatase: 66 IU/L (ref 44–121)
BUN/Creatinine Ratio: 18 (ref 10–24)
BUN: 9 mg/dL (ref 8–27)
Bilirubin Total: 0.5 mg/dL (ref 0.0–1.2)
CO2: 28 mmol/L (ref 20–29)
Calcium: 9.2 mg/dL (ref 8.6–10.2)
Chloride: 97 mmol/L (ref 96–106)
Creatinine, Ser: 0.5 mg/dL — ABNORMAL LOW (ref 0.76–1.27)
GFR calc Af Amer: 130 mL/min/{1.73_m2} (ref >59)
GFR calc non Af Amer: 112 mL/min/{1.73_m2} (ref >59)
Globulin, Total: 2.3 g/dL (ref 1.5–4.5)
Glucose: 93 mg/dL (ref 65–99)
Potassium: 4.4 mmol/L (ref 3.5–5.2)
Sodium: 142 mmol/L (ref 134–144)
Total Protein: 6.4 g/dL (ref 6.0–8.5)

## 2019-11-30 LAB — TSH: TSH: 0.914 u[IU]/mL (ref 0.450–4.500)

## 2019-11-30 LAB — VITAMIN B12: Vitamin B-12: 1013 pg/mL (ref 232–1245)

## 2019-11-30 MED ORDER — NALOXEGOL OXALATE 25 MG PO TABS: 25.0000 mg | ORAL_TABLET | Freq: Every day | ORAL | 0 refills | Status: DC

## 2019-11-30 NOTE — Progress Notes (Signed)
Good morning, please let Kevin Nelson know his labs have returned.  Anemia is improving with normal iron and B12 levels.  Thyroid level is normal and kidney function continues to be stable.  Overall I am happy with these labs.  I would recommend taking oral Vitamin B12 1000 MCG daily, this will work as well as the injections do and offer benefit.  There are even dissolvable B12 tablets you can place under tongue and they will dissolve.  You can get these in many vitamin sections.  Any questions?  Have a great day!! Keep being awesome!!  Thank you for allowing me to participate in your care. Kindest regards, Hartlee Amedee

## 2019-12-03 DIAGNOSIS — Z8521 Personal history of malignant neoplasm of larynx: Secondary | ICD-10-CM | POA: Diagnosis not present

## 2019-12-03 DIAGNOSIS — F17200 Nicotine dependence, unspecified, uncomplicated: Secondary | ICD-10-CM | POA: Diagnosis not present

## 2019-12-05 ENCOUNTER — Ambulatory Visit (INDEPENDENT_AMBULATORY_CARE_PROVIDER_SITE_OTHER): Payer: Medicare Other | Admitting: Pulmonary Disease

## 2019-12-05 ENCOUNTER — Other Ambulatory Visit: Payer: Self-pay

## 2019-12-05 ENCOUNTER — Encounter: Payer: Self-pay | Admitting: Pulmonary Disease

## 2019-12-05 VITALS — BP 112/78 | HR 104 | Temp 97.5°F | Ht 66.0 in | Wt 104.4 lb

## 2019-12-05 DIAGNOSIS — C329 Malignant neoplasm of larynx, unspecified: Secondary | ICD-10-CM

## 2019-12-05 DIAGNOSIS — C61 Malignant neoplasm of prostate: Secondary | ICD-10-CM | POA: Diagnosis not present

## 2019-12-05 DIAGNOSIS — F17210 Nicotine dependence, cigarettes, uncomplicated: Secondary | ICD-10-CM

## 2019-12-05 DIAGNOSIS — J449 Chronic obstructive pulmonary disease, unspecified: Secondary | ICD-10-CM

## 2019-12-05 DIAGNOSIS — J9611 Chronic respiratory failure with hypoxia: Secondary | ICD-10-CM

## 2019-12-05 MED ORDER — BREZTRI AEROSPHERE 160-9-4.8 MCG/ACT IN AERO: 2.0000 | INHALATION_SPRAY | Freq: Two times a day (BID) | RESPIRATORY_TRACT | 0 refills | Status: DC

## 2019-12-05 MED ORDER — ALBUTEROL SULFATE HFA 108 (90 BASE) MCG/ACT IN AERS
2.0000 | INHALATION_SPRAY | Freq: Four times a day (QID) | RESPIRATORY_TRACT | 6 refills | Status: DC | PRN
Start: 2019-12-05 — End: 2020-03-10

## 2019-12-05 MED ORDER — ALBUTEROL SULFATE (2.5 MG/3ML) 0.083% IN NEBU
2.5000 mg | INHALATION_SOLUTION | Freq: Four times a day (QID) | RESPIRATORY_TRACT | 11 refills | Status: AC | PRN
Start: 2019-12-05 — End: ?

## 2019-12-05 NOTE — Patient Instructions (Signed)
We are giving you a trial of Breztri 2 puffs twice a day, you have enough for a month's time.  Please call us and let us know how you are doing with this.  We are switching your nebulizer and rescue inhaler solutions to albuterol.  This should work better with regards of not making your secretions too thick in addition you are already getting stronger medication on the [place].   We will see you in follow-up in 4 to 6 weeks time with either me or the practitioner

## 2019-12-05 NOTE — Progress Notes (Signed)
Subjective:    Patient ID: Kevin Nelson, male    DOB: Dec 13, 1950, 69 y.o.   MRN: [id number]  Requesting MD/Service:Kevin Ned Card, NP Date of initial consultation:01/25/18, by Dr. Merton Nelson Reason for consultation:Very severe COPD, smoker  PT PROFILE: 69 y.o.malesmoker (started age 10, maximum 2 PPD) recently moved to the area with history of oxygen dependent COPD following here for COPD/pulmonary issues  DATA: 02/03/18 6MWT:Submaximal effort. Test terminated after patient reached SPO2 84%. 02/14/18 PFTs:FVC: 2.28 L (56 %pred), FEV1: 0.80 L (25 %pred), FEV1/FVC: 35%, TLC: 7.77 L (121 %pred), DLCO 45 %pred. Flow volume curve consistent with very severe obstruction. No significant change after bronchodilator challenge. 04/14/18 CTA chest:Emphysema with bronchial thickening. Retained mucus in the dependent trachea.Prominent bilateral hilar lymph nodes are likely reactive. 5 mm nodular focus in the dependent left lower lobe is indeterminate for nodule versus atelectasis. 06/19/18 LDCT chest:Lung-RADS 2, benign appearance or behavior.LLL nodules stable.Continue annual screening with low-dose chest CT without contrast in 12 months.  HPI Is a 69 year old current smoker (8 cigarettes/day) with very severe COPD FEV1 at 25% of predicted, since 4 follow-up on COPD issues.  Prior patient of Dr. Merton Nelson.  I initially saw him on 19 October 2018 with a subsequent visit (via telephone) on 20 December 2018.  Since then he had been lost to follow-up.  In the interim he had developed issues with head and neck cancer mainly squamous cell carcinoma of the larynx.  He was evaluated at [facility] and subsequently underwent chemo and XRT at [facility].  He continues to follow with oncology.  Most recently a chest CT has shown findings consistent with rounded atelectasis on the left lower lobe.  The patient apparently had an exacerbation with antibiotics and prednisone by his primary care provider.  Since  that time he has done better.  He does complain however of very thick tenacious secretions which have been an issue for him.  In reviewing his records and his medications it appears that he is in duplication of LAMA/SAMA/LABA.  He is getting Stiolto, DuoNeb and Combivent all essentially the same medication Stiolto being longer acting.  Suspect that this is drying his secretions significantly.  He has not had any other symptomatology.  He continues to smoke as noted above.  He voices no other complaint.   Review of Systems  A 10 point review of systems was performed and it is as noted above otherwise negative.  Patient Active Problem List   Diagnosis Date Noted  . Racing heart beat 11/29/2019  . B12 deficiency 11/29/2019  . Hypophosphatemia 04/30/2019  . Goals of care, counseling/discussion 01/25/2019  . Squamous cell carcinoma of larynx ([place]) 01/04/2019  . Numbness of left foot 12/21/2018  . Chronic nonintractable headache 11/10/2018  . Sensorineural hearing loss ([facility]) 08/21/2018  . Coronary atherosclerosis 07/31/2018  . Chronic hepatitis ([place]) 04/21/2018  . Lung nodule < 6cm on CT 04/19/2018  . Senile purpura ([place]) 04/19/2018  . Protein-calorie malnutrition ([place]) 04/19/2018  . Nicotine dependence, cigarettes, uncomplicated 70/01/7492  . Controlled substance agreement signed 01/27/2018  . COPD, very severe ([place]) 01/13/2018  . Prostate cancer ([place]) 01/13/2018  . History of basal cell carcinoma (BCC) 01/13/2018  . Generalized anxiety disorder 01/13/2018   No Known Allergies  Current Meds  Medication Sig  . amoxicillin (AMOXIL) 500 MG capsule Take 500 mg by mouth 3 (three) times daily.  . clonazePAM (KLONOPIN) 1 MG tablet TAKE 1 TABLET(1 MG) BY MOUTH TWICE DAILY AS NEEDED FOR ANXIETY  .  Cyanocobalamin 1000 MCG/ML KIT Inject 1,000 mcg as directed every 30 (thirty) days.  . fluticasone (FLONASE) 50 MCG/ACT nasal spray SHAKE LIQUID AND USE 1 SPRAY IN EACH NOSTRIL TWICE DAILY  .  naloxegol oxalate (MOVANTIK) 25 MG TABS tablet Take 1 tablet (25 mg total) by mouth daily.  [name] oxyCODONE (OXY IR/ROXICODONE) 5 MG immediate release tablet Take 1 tablet (5 mg total) by mouth every 8 (eight) hours as needed for severe pain.  [name] oxyCODONE (OXYCONTIN) 10 mg 12 hr tablet Take 1 tablet (10 mg total) by mouth every 12 (twelve) hours.  . OXYGEN Inhale 3 L into the lungs daily.  . prochlorperazine (COMPAZINE) 10 MG tablet TAKE 1 TABLET(10 MG) BY MOUTH EVERY 6 HOURS AS NEEDED FOR NAUSEA OR VOMITING  . [DISCONTINUED] COMBIVENT RESPIMAT 20-100 MCG/ACT AERS respimat INHALE 1 PUFF BY MOUTH INTO THE LUNGS EVERY 6 HOURS AS NEEDED  . [DISCONTINUED] ipratropium-albuterol (DUONEB) 0.5-2.5 (3) MG/3ML SOLN Take 3 mLs by nebulization every 6 (six) hours as needed.  . [DISCONTINUED] STIOLTO RESPIMAT 2.5-2.5 MCG/ACT AERS INHALE 2 PUFFS INTO THE LUNGS DAILY   Immunization History  Administered Date(s) Administered  . Influenza-Unspecified 10/18/2017  . Moderna SARS-COVID-2 Vaccination 08/13/2019, 09/13/2019   Social History   Tobacco Use  . Smoking status: Current Every Day Smoker    Packs/day: 2.00    Years: 52.00    Pack years: 104.00    Types: Cigarettes  . Smokeless tobacco: Never Used  . Tobacco comment: 8 cigarettes a day 12/05/2019  Substance Use Topics  . Alcohol use: Not Currently    Alcohol/week: 3.0 standard drinks    Types: 3 Cans of beer per week      Objective:   Physical Exam BP 112/78 (BP Location: Left Arm, Cuff Size: Normal)   Pulse (!) 104   Temp (!) 97.5 F (36.4 C) (Temporal)   Ht '5\' 6"'  (1.676 m)   Wt 104 lb 6.4 oz (47.4 kg)   SpO2 98%   BMI 16.85 kg/m  O2 sats on 4 L/min pulse O2.  GENERAL: Thin/cachectic gentleman, chronically ill-appearing, with chronic use of accessories, no overt acute distress.  Presents in transport chair. HEAD: Normocephalic, atraumatic.  EYES: Pupils equal, round, reactive to light.  No scleral icterus.  MOUTH: Nose/mouth/throat not  examined due to masking requirements for COVID 19.  Known poor dentition. NECK: Supple. No thyromegaly. Trachea midline. No JVD.  No adenopathy. PULMONARY: Distant breath sounds.  No adventitious sounds, poor air entry. CARDIOVASCULAR: S1 and S2. Regular rate and rhythm.  No rubs, murmurs or gallops heard. ABDOMEN: Scaphoid, nondistended. MUSCULOSKELETAL: No joint deformity, no clubbing, no edema.  NEUROLOGIC: No overt focal deficit.  Speech is fluent. SKIN: Intact,warm,dry.  Limited exam, no rashes PSYCH: Mood and behavior normal.     Assessment & Plan:     ICD-10-CM   1. COPD, very severe ([place])  J44.9    Trial of Breztri 2 puffs twice a day Change rescue medications to albuterol  2. Chronic hypoxemic respiratory failure (HCC)  J96.11    Continue oxygen at 4 L/min Patient compliant with O2  3. Squamous cell carcinoma of larynx (HCC)  C32.9    This issue adds complexity to his management Follows with oncology (Dr. Janese Banks) Status post chemo XRT  4. Metastatic prostate cancer ([place])  C61    Appears to be controlled Follows with urology/oncology  5. Tobacco dependence due to cigarettes  F17.210    Counseled regards discontinuation of smoking Has no interest in  quitting Continues to smoke 8 cigarettes/day   Meds ordered this encounter  Medications  . Budeson-Glycopyrrol-Formoterol (BREZTRI AEROSPHERE) 160-9-4.8 MCG/ACT AERO    Sig: Inhale 2 puffs into the lungs in the morning and at bedtime.    Dispense:  5.9 g    Refill:  0    Order Specific Question:   Lot Number?    Answer:   [id number]    Order Specific Question:   Expiration Date?    Answer:   06/18/2021    Order Specific Question:   Manufacturer?    Answer:   [employer] [[id number]]    Order Specific Question:   Quantity    Answer:   2  . albuterol (PROVENTIL) (2.5 MG/3ML) 0.083% nebulizer solution    Sig: Take 3 mLs (2.5 mg total) by nebulization every 6 (six) hours as needed for wheezing or shortness of breath.    Dispense:   120 mL    Refill:  11  . albuterol (VENTOLIN HFA) 108 (90 Base) MCG/ACT inhaler    Sig: Inhale 2 puffs into the lungs every 6 (six) hours as needed for wheezing or shortness of breath.    Dispense:  18 g    Refill:  6   Discussion:  Patient has very severe COPD, has complaints of tenacious sputum.  He has duplications of medications.  We will try to optimize him using Breztri 2 inhalations twice a day and change his rescue medications to albuterol, hopefully this will have less of a drying effect for him.  In 4 to 6 weeks time with either me or the nurse practitioner.  Renold Don, MD [facility]   *This note was dictated using voice recognition software/Dragon.  Despite best efforts to proofread, errors can occur which can change the meaning.  Any change was purely unintentional.

## 2019-12-17 ENCOUNTER — Telehealth: Payer: Self-pay | Admitting: Pulmonary Disease

## 2019-12-17 ENCOUNTER — Other Ambulatory Visit: Payer: Self-pay | Admitting: *Deleted

## 2019-12-17 ENCOUNTER — Telehealth: Payer: Self-pay | Admitting: *Deleted

## 2019-12-17 DIAGNOSIS — S92332D Displaced fracture of third metatarsal bone, left foot, subsequent encounter for fracture with routine healing: Secondary | ICD-10-CM | POA: Diagnosis not present

## 2019-12-17 DIAGNOSIS — S92322D Displaced fracture of second metatarsal bone, left foot, subsequent encounter for fracture with routine healing: Secondary | ICD-10-CM | POA: Diagnosis not present

## 2019-12-17 DIAGNOSIS — S92342D Displaced fracture of fourth metatarsal bone, left foot, subsequent encounter for fracture with routine healing: Secondary | ICD-10-CM | POA: Diagnosis not present

## 2019-12-17 MED ORDER — FLUCONAZOLE 100 MG PO TABS: 100.0000 mg | ORAL_TABLET | Freq: Every day | ORAL | 0 refills | Status: DC

## 2019-12-17 MED ORDER — OXYCODONE HCL 5 MG PO TABS
5.0000 mg | ORAL_TABLET | Freq: Three times a day (TID) | ORAL | 0 refills | Status: DC | PRN
Start: 2019-12-17 — End: 2020-01-15

## 2019-12-17 MED ORDER — OXYCODONE HCL ER 10 MG PO T12A
10.0000 mg | EXTENDED_RELEASE_TABLET | Freq: Two times a day (BID) | ORAL | 0 refills | Status: DC
Start: 2019-12-17 — End: 2019-12-21

## 2019-12-17 NOTE — Telephone Encounter (Signed)
Returned Kevin Nelson's call. When explaining below recommendations, Kevin Nelson stated that patient was having a problem and she needed to go.  Call was ended.   Will leave encounter open to contact Kevin Nelson once more to ensure she understands below recommendations.

## 2019-12-17 NOTE — Telephone Encounter (Signed)
I recommend he go back to DuoNeb 4 times a day with albuterol as needed.  He has albuterol prescription.  Issues with his head and neck cancer he will likely never be able to use metered-dose inhalers.

## 2019-12-17 NOTE — Telephone Encounter (Signed)
Kevin Nelson is aware of below recommendations and voiced her understanding.  Nothing further needed.

## 2019-12-17 NOTE — Telephone Encounter (Signed)
He can start with ent and see if they can help. If not I can see him tomorrow

## 2019-12-17 NOTE — Telephone Encounter (Signed)
Call returned and they have already called the Pulmonologist who thinks he has thrush form the inhalers and the fact that he is not rinsing his mouth after use

## 2019-12-17 NOTE — Telephone Encounter (Addendum)
Kevin Nelson is aware of below recommendations and voiced her understanding.  Kevin Nelson stated that she looked in patient's mouth with a flashlight after our conversation and she did see white spots in patient's mouth.  I have made Dr. Patsey Berthold aware of this who recommended Diflucan 100mg  for 14 days.  Rx has been sent to preferred pharmacy.    Kevin Nelson also stated that since starting [facility] patient has needed to use oxygen more.   Dr. Patsey Berthold, please advise. Thanks

## 2019-12-17 NOTE — Telephone Encounter (Signed)
Kevin Nelson called reporting that Kevin Nelson has an extremely sore throat and that e was seen by ENT recently and all that was found was scar tissue, She alsao reports that he saw another doctor who started him on some inhalers and it was since the inhalers that his throat really started hurting. She is asking who she should call about this, ENT, [place], Or the doctor who ordered the inhalers. Please advise

## 2019-12-17 NOTE — Telephone Encounter (Signed)
Spoke to [place] and relayed below message.  [name] stated that she would call back to discuss this further, as she is currently at breakfast.

## 2019-12-17 NOTE — Telephone Encounter (Signed)
Called and spoke to patient's spouse, Patti(DPR). [name] stated that patient has developed a sore throat since starting Breztri.  Patient is not rinsing his mouth out after using Breztri. Advised patient to rinse mouth out after each use.  Denied white spots in mouth or tongue.  Dr. Patsey Berthold, please advise. Thanks

## 2019-12-17 NOTE — Telephone Encounter (Signed)
Kevin Nelson returning missed call. [phone]

## 2019-12-17 NOTE — Telephone Encounter (Signed)
Try some baking soda in the rinse water. Try approximately 1/8 to 1/4 of a teaspoon in 8 ounces of water and using that water to rinse well and gargle.

## 2019-12-19 ENCOUNTER — Ambulatory Visit: Payer: Medicare Other | Admitting: Nurse Practitioner

## 2019-12-20 ENCOUNTER — Other Ambulatory Visit: Payer: Self-pay | Admitting: *Deleted

## 2019-12-20 DIAGNOSIS — L249 Irritant contact dermatitis, unspecified cause: Secondary | ICD-10-CM | POA: Diagnosis not present

## 2019-12-20 DIAGNOSIS — Z85828 Personal history of other malignant neoplasm of skin: Secondary | ICD-10-CM | POA: Diagnosis not present

## 2019-12-20 DIAGNOSIS — C329 Malignant neoplasm of larynx, unspecified: Secondary | ICD-10-CM

## 2019-12-20 DIAGNOSIS — L720 Epidermal cyst: Secondary | ICD-10-CM | POA: Diagnosis not present

## 2019-12-20 DIAGNOSIS — C44319 Basal cell carcinoma of skin of other parts of face: Secondary | ICD-10-CM | POA: Diagnosis not present

## 2019-12-20 DIAGNOSIS — C44612 Basal cell carcinoma of skin of right upper limb, including shoulder: Secondary | ICD-10-CM | POA: Diagnosis not present

## 2019-12-20 DIAGNOSIS — C44519 Basal cell carcinoma of skin of other part of trunk: Secondary | ICD-10-CM | POA: Diagnosis not present

## 2019-12-20 DIAGNOSIS — D492 Neoplasm of unspecified behavior of bone, soft tissue, and skin: Secondary | ICD-10-CM | POA: Diagnosis not present

## 2019-12-20 DIAGNOSIS — C44619 Basal cell carcinoma of skin of left upper limb, including shoulder: Secondary | ICD-10-CM | POA: Diagnosis not present

## 2019-12-21 ENCOUNTER — Inpatient Hospital Stay: Payer: Medicare Other | Attending: Oncology | Admitting: Oncology

## 2019-12-21 ENCOUNTER — Ambulatory Visit: Payer: Medicare Other

## 2019-12-21 ENCOUNTER — Inpatient Hospital Stay: Payer: Medicare Other

## 2019-12-21 ENCOUNTER — Telehealth: Payer: Self-pay

## 2019-12-21 VITALS — BP 93/56 | HR 92 | Temp 98.0°F | Resp 18 | Wt 99.1 lb

## 2019-12-21 DIAGNOSIS — Z9981 Dependence on supplemental oxygen: Secondary | ICD-10-CM | POA: Insufficient documentation

## 2019-12-21 DIAGNOSIS — F17210 Nicotine dependence, cigarettes, uncomplicated: Secondary | ICD-10-CM | POA: Diagnosis not present

## 2019-12-21 DIAGNOSIS — Z08 Encounter for follow-up examination after completed treatment for malignant neoplasm: Secondary | ICD-10-CM

## 2019-12-21 DIAGNOSIS — Z8589 Personal history of malignant neoplasm of other organs and systems: Secondary | ICD-10-CM | POA: Diagnosis not present

## 2019-12-21 DIAGNOSIS — E43 Unspecified severe protein-calorie malnutrition: Secondary | ICD-10-CM

## 2019-12-21 DIAGNOSIS — G8929 Other chronic pain: Secondary | ICD-10-CM | POA: Diagnosis not present

## 2019-12-21 DIAGNOSIS — Z8521 Personal history of malignant neoplasm of larynx: Secondary | ICD-10-CM | POA: Diagnosis not present

## 2019-12-21 DIAGNOSIS — D509 Iron deficiency anemia, unspecified: Secondary | ICD-10-CM | POA: Diagnosis not present

## 2019-12-21 DIAGNOSIS — Z8546 Personal history of malignant neoplasm of prostate: Secondary | ICD-10-CM | POA: Insufficient documentation

## 2019-12-21 DIAGNOSIS — I252 Old myocardial infarction: Secondary | ICD-10-CM | POA: Insufficient documentation

## 2019-12-21 DIAGNOSIS — E611 Iron deficiency: Secondary | ICD-10-CM

## 2019-12-21 DIAGNOSIS — C329 Malignant neoplasm of larynx, unspecified: Secondary | ICD-10-CM

## 2019-12-21 DIAGNOSIS — I2510 Atherosclerotic heart disease of native coronary artery without angina pectoris: Secondary | ICD-10-CM

## 2019-12-21 DIAGNOSIS — Z931 Gastrostomy status: Secondary | ICD-10-CM | POA: Diagnosis not present

## 2019-12-21 DIAGNOSIS — Z9221 Personal history of antineoplastic chemotherapy: Secondary | ICD-10-CM | POA: Diagnosis not present

## 2019-12-21 DIAGNOSIS — B379 Candidiasis, unspecified: Secondary | ICD-10-CM | POA: Diagnosis not present

## 2019-12-21 DIAGNOSIS — J449 Chronic obstructive pulmonary disease, unspecified: Secondary | ICD-10-CM | POA: Insufficient documentation

## 2019-12-21 DIAGNOSIS — Z809 Family history of malignant neoplasm, unspecified: Secondary | ICD-10-CM | POA: Insufficient documentation

## 2019-12-21 DIAGNOSIS — Z79899 Other long term (current) drug therapy: Secondary | ICD-10-CM | POA: Insufficient documentation

## 2019-12-21 LAB — CBC WITH DIFFERENTIAL/PLATELET
Abs Immature Granulocytes: 0.03 10*3/uL (ref 0.00–0.07)
Basophils Absolute: 0.1 10*3/uL (ref 0.0–0.1)
Basophils Relative: 1 %
Eosinophils Absolute: 0.2 10*3/uL (ref 0.0–0.5)
Eosinophils Relative: 3 %
HCT: 39 % (ref 39.0–52.0)
Hemoglobin: 12.2 g/dL — ABNORMAL LOW (ref 13.0–17.0)
Immature Granulocytes: 0 %
Lymphocytes Relative: 18 %
Lymphs Abs: 1.3 10*3/uL (ref 0.7–4.0)
MCH: 28.8 pg (ref 26.0–34.0)
MCHC: 31.3 g/dL (ref 30.0–36.0)
MCV: 92 fL (ref 80.0–100.0)
Monocytes Absolute: 1 10*3/uL (ref 0.1–1.0)
Monocytes Relative: 14 %
Neutro Abs: 4.5 10*3/uL (ref 1.7–7.7)
Neutrophils Relative %: 64 %
Platelets: 245 10*3/uL (ref 150–400)
RBC: 4.24 MIL/uL (ref 4.22–5.81)
RDW: 15.4 % (ref 11.5–15.5)
WBC: 7.1 10*3/uL (ref 4.0–10.5)
nRBC: 0 % (ref 0.0–0.2)

## 2019-12-21 LAB — IRON AND TIBC
Iron: 45 ug/dL (ref 45–182)
Saturation Ratios: 12 % — ABNORMAL LOW (ref 17.9–39.5)
TIBC: 371 ug/dL (ref 250–450)
UIBC: 326 ug/dL

## 2019-12-21 LAB — COMPREHENSIVE METABOLIC PANEL
ALT: 13 U/L (ref 0–44)
AST: 22 U/L (ref 15–41)
Albumin: 4.4 g/dL (ref 3.5–5.0)
Alkaline Phosphatase: 49 U/L (ref 38–126)
Anion gap: 9 (ref 5–15)
BUN: 11 mg/dL (ref 8–23)
CO2: 35 mmol/L — ABNORMAL HIGH (ref 22–32)
Calcium: 9 mg/dL (ref 8.9–10.3)
Chloride: 93 mmol/L — ABNORMAL LOW (ref 98–111)
Creatinine, Ser: 0.53 mg/dL — ABNORMAL LOW (ref 0.61–1.24)
GFR, Estimated: >60 mL/min (ref >60)
Glucose, Bld: 125 mg/dL — ABNORMAL HIGH (ref 70–99)
Potassium: 4.2 mmol/L (ref 3.5–5.1)
Sodium: 137 mmol/L (ref 135–145)
Total Bilirubin: 0.9 mg/dL (ref 0.3–1.2)
Total Protein: 7.3 g/dL (ref 6.5–8.1)

## 2019-12-21 LAB — FERRITIN: Ferritin: 18 ng/mL — ABNORMAL LOW (ref 24–336)

## 2019-12-21 LAB — VITAMIN B12: Vitamin B-12: 530 pg/mL (ref 180–914)

## 2019-12-21 NOTE — Telephone Encounter (Signed)
-----   Message from Sindy Guadeloupe, MD sent at 12/21/2019 11:58 AM EST ----- He is iron deficient. Please have him take oral iron tab 325 mg once or twice daily as tolerated.

## 2019-12-21 NOTE — Progress Notes (Signed)
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 Richardson Landry with symptoms of difficulty swallowing and persistent sore throat which has been ongoing for the last 1 year.NPL exam showed afriable mass involving the false vocal cord but did not involve the true vocal cords. True vocal cords remain mobile on exam. Hypopharynx and tongue base was clear.   This was followed by CT soft tissue neck which showed a 3.3 x 1.7 x 3.5 cm mass which was thought to be involving both the true and false vocal cords and extending anteriorly to involve the anterior commissure. Mass extends to involve the paraglottic space and erodes through the thyroid cartilage anteriorly and left of midline. There is also invasion of the preepiglottic space inferiorly. Enlarged cystic necrotic level 3/4 lymph node measuring 1.6 x 1.2 cm. Findings are consistent with nodal metastatic disease. Biopsy of the mass showed squamous cell carcinoma.  PET/CT showed hyppermetabolic mass extending from left glottis to anterior commisure. Solitary left level 2 metastatic Lymph node. No distant metastatic disease  Patient met with [facility] ENT and decided against total laryngectomy. Plan is for concurrent chemo/RT  +/-adjuvantchemotherapy following chemo/RT.patient seen 5 cycles of weekly cisplatin chemotherapy with the last dose which was given on 04/30/2019.  Repeat PET scan on 07/19/2019 showed complete resolution of hypermetabolic activity associated with larynx along with resolution at level 3 lymph node.  No evidence of recurrent or residual disease.   Interval history-patient continues to have problems with poor dentition and is waiting on getting his dentures due to which he is unable to chew his food and is losing weight.  He also had a recent sore throat after his inhalers were changed and was complicated by thrush which is slowly getting better.  States that he still has some difficulty swallowing and pain associated with it.  He is moving his bowels regularly  ECOG PS- 1 Pain scale- 4 Opioid associated constipation- no Review of systems- Review of Systems  Constitutional: Positive for malaise/fatigue. Negative for chills, fever and weight loss.  HENT: Negative for congestion, ear discharge and nosebleeds.        Pain during swallowing  Eyes: Negative for blurred vision.  Respiratory: Negative for cough, hemoptysis, sputum production, shortness of breath and wheezing.   Cardiovascular: Negative for chest pain, palpitations, orthopnea and claudication.  Gastrointestinal: Negative for abdominal pain, blood in stool, constipation, diarrhea, heartburn, melena, nausea and vomiting.  Genitourinary: Negative for dysuria, flank pain, frequency, hematuria and urgency.  Musculoskeletal: Negative for back pain, joint pain and myalgias.  Skin: Negative for rash.  Neurological: Negative for dizziness, tingling, focal weakness, seizures, weakness and headaches.  Endo/Heme/Allergies: Does not bruise/bleed easily.  Psychiatric/Behavioral: Negative for depression and suicidal ideas. The patient does not have insomnia.       No Known Allergies   Past Medical History:  Diagnosis Date  . Anxiety   . Basal  cell carcinoma   . Cigarette smoker   . Dental decay   . Dysphagia   . Dyspnea   . End stage COPD ([place])    end stage copd, emphysema  . Erectile dysfunction   . Headache    every morning  . Hepatitis C    treated for 8 weeks this past year with Mayvret  . History of nonmelanoma skin cancer   . Hoarseness, chronic   . Myocardial infarction [facility]) march/april 2020   mild heart attack  . Prostate cancer ([place])   . Respiratory failure, acute ([place])    was inpt for 5 days. thought he was going to die.  . Sleep apnea   . Squamous cell skin cancer   . Throat cancer ([place])   . Use of leuprolide acetate (Lupron)    treated for prostate cancer.   . 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     Past Surgical History:  Procedure Laterality Date  . FACIAL LACERATION REPAIR    . HERNIA REPAIR Left 1992   inguinal  . IR GASTROSTOMY TUBE MOD SED  04/26/2019  . MICROLARYNGOSCOPY N/A 01/17/2019   Procedure: MICROLARYNGOSCOPY WITH BX;  Surgeon: Clyde Canterbury, MD;  Location: [facility] ORS;  Service: ENT;  Laterality: N/A;  . PORTA CATH INSERTION N/A 03/15/2019   Procedure: PORTA CATH INSERTION;  Surgeon: Algernon Huxley, MD;  Location: [place] [facility];  Service: Cardiovascular;  Laterality: N/A;  . PORTA CATH REMOVAL N/A 10/08/2019   Procedure: PORTA CATH REMOVAL;  Surgeon: Algernon Huxley, MD;  Location: [place] [facility];  Service: Cardiovascular;  Laterality: N/A;  . PROSTATE BIOPSY    . SKIN CANCER EXCISION    . TRIGGER FINGER RELEASE Left 10/2018    Social History   Socioeconomic History  . Marital status: Significant Other    Spouse name: [name], girlfriend  . Number of children: Not on file  . Years of education: Not on file  . Highest education level: Not on file  Occupational History  . Occupation: Chief Strategy Officer    Comment: retired  Tobacco Use  . Smoking status: Current Every Day Smoker    Packs/day: 2.00    Years: 52.00    Pack years: 104.00    Types: Cigarettes  .  Smokeless tobacco: Never Used  . Tobacco comment: 8 cigarettes a day 12/05/2019  Vaping Use  . Vaping Use: Never used  Substance and Sexual Activity  . Alcohol use: Not Currently    Alcohol/week: 3.0 standard drinks    Types: 3 Cans of beer per week  . Drug use: Never  . Sexual activity: Yes  Other Topics Concern  . Not on file  Social History Narrative   Patient has retired d/t respiratory status. Currently lives with girlfriend    Social Determinants of Health   Financial Resource Strain: Low Risk   . Difficulty of Paying Living Expenses: Not very hard  Food Insecurity: No Food Insecurity  . Worried About Charity fundraiser in the Last Year: Never true  . Ran Out of Food in the Last Year: Never true  Transportation Needs: No Transportation Needs  . Lack of Transportation (Medical): No  . Lack of Transportation (Non-Medical): No  Physical Activity: Inactive  . Days of Exercise per Week: 0 days  . Minutes of Exercise per Session: 0 min  Stress: Stress Concern Present  . Feeling of  Stress : Very much  Social Connections:   . Frequency of Communication with Friends and Family: Not on file  . Frequency of Social Gatherings with Friends and Family: Not on file  . Attends Religious Services: Not on file  . Active Member of Clubs or Organizations: Not on file  . Attends Archivist Meetings: Not on file  . Marital Status: Not on file  Intimate Partner Violence:   . Fear of Current or Ex-Partner: Not on file  . Emotionally Abused: Not on file  . Physically Abused: Not on file  . Sexually Abused: Not on file    Family History  Problem Relation Age of Onset  . Cancer Sister   . Cancer - Cervical Mother   . Heart attack Brother   . Heart attack Maternal Grandfather      Current Outpatient Medications:  .  albuterol (PROVENTIL) (2.5 MG/3ML) 0.083% nebulizer solution, Take 3 mLs (2.5 mg total) by nebulization every 6 (six) hours as needed for wheezing or shortness  of breath., Disp: 120 mL, Rfl: 11 .  albuterol (VENTOLIN HFA) 108 (90 Base) MCG/ACT inhaler, Inhale 2 puffs into the lungs every 6 (six) hours as needed for wheezing or shortness of breath., Disp: 18 g, Rfl: 6 .  clonazePAM (KLONOPIN) 1 MG tablet, TAKE 1 TABLET(1 MG) BY MOUTH TWICE DAILY AS NEEDED FOR ANXIETY, Disp: 60 tablet, Rfl: 2 .  fluconazole (DIFLUCAN) 100 MG tablet, Take 1 tablet (100 mg total) by mouth daily., Disp: 14 tablet, Rfl: 0 .  fluticasone (FLONASE) 50 MCG/ACT nasal spray, SHAKE LIQUID AND USE 1 SPRAY IN EACH NOSTRIL TWICE DAILY, Disp: 16 g, Rfl: 1 .  naloxegol oxalate (MOVANTIK) 25 MG TABS tablet, Take 1 tablet (25 mg total) by mouth daily., Disp: 30 tablet, Rfl: 0 .  oxyCODONE (OXY IR/ROXICODONE) 5 MG immediate release tablet, Take 1 tablet (5 mg total) by mouth every 8 (eight) hours as needed for severe pain., Disp: 60 tablet, Rfl: 0 .  OXYGEN, Inhale 3 L into the lungs daily., Disp: , Rfl:  .  prochlorperazine (COMPAZINE) 10 MG tablet, TAKE 1 TABLET(10 MG) BY MOUTH EVERY 6 HOURS AS NEEDED FOR NAUSEA OR VOMITING, Disp: 30 tablet, Rfl: 1  Physical exam:  Vitals:   12/21/19 1008  BP: (!) 93/56  Pulse: 92  Resp: 18  Temp: 98 F (36.7 C)  TempSrc: Tympanic  SpO2: 96%  Weight: 99 lb 1.6 oz (45 kg)   Physical Exam HENT:     Head: Normocephalic and atraumatic.     Mouth/Throat:     Mouth: Mucous membranes are moist.     Pharynx: Oropharynx is clear.  Cardiovascular:     Rate and Rhythm: Normal rate and regular rhythm.     Heart sounds: Normal heart sounds.  Pulmonary:     Effort: Pulmonary effort is normal.     Breath sounds: Normal breath sounds.  Abdominal:     General: Bowel sounds are normal.     Palpations: Abdomen is soft.  Lymphadenopathy:     Comments: No palpable cervical adenopathy  Skin:    General: Skin is warm and dry.  Neurological:     Mental Status: He is alert and oriented to person, place, and time.      CMP Latest Ref Rng & Units  12/21/2019  Glucose 70 - 99 mg/dL 125(H)  BUN 8 - 23 mg/dL 11  Creatinine 0.61 - 1.24 mg/dL 0.53(L)  Sodium 135 - 145 mmol/L 137  Potassium 3.5 - 5.1 mmol/L 4.2  Chloride 98 - 111 mmol/L 93(L)  CO2 22 - 32 mmol/L 35(H)  Calcium 8.9 - 10.3 mg/dL 9.0  Total Protein 6.5 - 8.1 g/dL 7.3  Total Bilirubin 0.3 - 1.2 mg/dL 0.9  Alkaline Phos 38 - 126 U/L 49  AST 15 - 41 U/L 22  ALT 0 - 44 U/L 13   CBC Latest Ref Rng & Units 12/21/2019  WBC 4.0 - 10.5 K/uL 7.1  Hemoglobin 13.0 - 17.0 g/dL 12.2(L)  Hematocrit 39 - 52 % 39.0  Platelets 150 - 400 K/uL 245    Assessment and plan- Patient is a 69 y.o. male WithSquamous cell carcinoma of the supraglottic larynx stage IVAcT4 acN1 cM0status post 5 weekly cycles of cisplatin and concurrent RT. he is currently in remission and this is a routine follow-up visit  Patient also follows up with Dr. Richardson Landry from ENT and there were no concerning findings of recurrence based on their exam recently.  Given that he had fairly extensive disease at presentation I would like to see if A repeat PET scan would be approved for early next year.  He was also found to have 2 foci of metabolic activity around the second portion of duodenum on his PET in July 2021 which can be followed up as well.  That may have been secondary to his G-tube placement and inflammation secondary to that  Patient is currently taking B12 oral supplements for his anemia.  Today he was found to have a low ferritin Of 18 and I have recommended that he should take oral iron once or twice a day as tolerated  Chronic pain: Presently there is no evidence of active cancer that is causing any pain during swallowing.  Explained to the patient that I would not like him to be on long-term narcotics for this.  I do not plan to renew his OxyContin at this time anymore and I would like him to slowly wean off oxycodone.  I will reassess his narcotic use in 4 months time but do not plan to continue giving him  oxycodone beyond 4 months   Visit Diagnosis 1. Squamous cell carcinoma of larynx (HCC)   2. Encounter for follow-up surveillance of head and neck cancer   3. Iron deficiency      Dr. Randa Evens, MD, MPH [facility] [phone] 12/21/2019 1:10 PM

## 2019-12-21 NOTE — Telephone Encounter (Signed)
Spoke with pt about Dr.Rao's Recommendation, he agreed to get the  Iron supplements. [facility]

## 2019-12-24 ENCOUNTER — Ambulatory Visit: Payer: Self-pay | Admitting: Pharmacist

## 2019-12-24 NOTE — Chronic Care Management (AMB) (Signed)
Chronic Care Management Pharmacy  Name: Kevin Nelson  MRN: [id number] DOB: 1950-01-21   Chief Complaint/ HPI  Kevin Nelson,  69 y.o. , male presents for their Follow-Up CCM visit with the clinical pharmacist via telephone.  PCP : [name], NP Patient Care Team: [name], NP as PCP - General (Nurse Practitioner) Greg Cutter, LCSW as Social Worker (Licensed Clinical Social Worker) Hall Busing, Nobie Putnam, RN as Case Manager ([place]) Vladimir Faster, [facility] (Pharmacist)  Their chronic conditions include: COPD and Anxiety tobacco abuse, h/o laryngeal cancer, prostate cancer, coronary atherosclerosis, B12 deficiency  Office Visits: 11/29/19- Marnee Guarneri, NP-  Bloodwork, prednisone burst  Consult Visit: 12/21/19- Dr. Janese Banks, Hem/ONC- BP 93/56, ferritin lvl =18 take po iron 1-2 x day, Oxycontin d/c, slow wean or oxy ir-- no further refills after 4 months 12/17/19- Dr. Cleda Mccreedy, DPM- follow up mulitiple L foot fxr--stay in boot 2-3 weeks 12/05/19- Dr. Duwayne Heck, Pulmonology - stiolto  to [facility] trial,rescue to albuterol very severe COPD 12/04/19- [facility]- waiting on Medicaid PA for dentures No Known Allergies  Medications: Outpatient Encounter Medications as of 12/24/2019  Medication Sig  . albuterol (PROVENTIL) (2.5 MG/3ML) 0.083% nebulizer solution Take 3 mLs (2.5 mg total) by nebulization every 6 (six) hours as needed for wheezing or shortness of breath.  [name] albuterol (VENTOLIN HFA) 108 (90 Base) MCG/ACT inhaler Inhale 2 puffs into the lungs every 6 (six) hours as needed for wheezing or shortness of breath.  . clonazePAM (KLONOPIN) 1 MG tablet TAKE 1 TABLET(1 MG) BY MOUTH TWICE DAILY AS NEEDED FOR ANXIETY  . fluconazole (DIFLUCAN) 100 MG tablet Take 1 tablet (100 mg total) by mouth daily.  . fluticasone (FLONASE) 50 MCG/ACT nasal spray SHAKE LIQUID AND USE 1 SPRAY IN EACH NOSTRIL TWICE DAILY  . naloxegol oxalate (MOVANTIK) 25 MG TABS tablet Take 1 tablet (25  mg total) by mouth daily.  [name] oxyCODONE (OXY IR/ROXICODONE) 5 MG immediate release tablet Take 1 tablet (5 mg total) by mouth every 8 (eight) hours as needed for severe pain.  . OXYGEN Inhale 3 L into the lungs daily.  . prochlorperazine (COMPAZINE) 10 MG tablet TAKE 1 TABLET(10 MG) BY MOUTH EVERY 6 HOURS AS NEEDED FOR NAUSEA OR VOMITING   No facility-administered encounter medications on file as of 12/24/2019.    Wt Readings from Last 3 Encounters:  12/21/19 99 lb 1.6 oz (45 kg)  12/05/19 104 lb 6.4 oz (47.4 kg)  11/29/19 101 lb 9.6 oz (46.1 kg)    Current Diagnosis/Assessment:    Goals Addressed            This Visit's Progress   . Pharmacy Care Plan       CARE PLAN ENTRY (see longitudinal plan of care for additional care plan information)  Current Barriers:  . Chronic Disease Management support, education, and care coordination needs related to COPD, Anxiety, and Tobacco use h/o laryngeal cancer, prostate cancer, coronary atherosclerosis, B12 deficiency.   COPD/Tobacco Abuse . Pharmacist Clinical Goal(s) o Over the next 90  days, patient will work with PharmD and providers to minimize symptoms . Current regimen:  o Breztri 2 puffs bid o Albuterol prn o Duoneb q6 h prn . Interventions: o Encouraged smoking cessation o Reviewed MOA of medications o Reviewed importance of rinsing mouth after inhaler use . Patient self care activities - Over the next 90 days, patient will: o Attend all scheduled appointments o Take medication as prescribed  Depression/Anxiety o Pharmacist Clinical Goal(s)  o Over the next 90 days, patient will work with PharmD and providers to maximize therapy . Current regimen:  o Clonazepam 1 mg bid . Interventions: o Performed medication review and fill history . Patient self care activities - Over the next 90 days, patient will:  Take medication as prescribed Report any side effects to PCP  Medication management . Pharmacist Clinical  Goal(s): o Over the next 90 days, patient will work with PharmD and providers to maintain optimal medication adherence . Current pharmacy: [facility] . Interventions o Comprehensive medication review performed. o Continue current medication management strategy . Patient self care activities - Over the next 90 days, patient will: o Focus on medication adherence by fill dates o Take medications as prescribed o Report any questions or concerns to PharmD and/or provider(s)  Initial goal documentation        COPD / Asthma / Tobacco   Last spirometry score: 02/14/18  Very Severe Current COPD Classification:  D (high sx, >/=2 exacerbations/yr)  Eosinophil count:   Lab Results  Component Value Date/Time   EOSPCT 3 12/21/2019 09:23 AM  %                               Eos (Absolute):  Lab Results  Component Value Date/Time   EOSABS 0.2 12/21/2019 09:23 AM   EOSABS 0.2 11/29/2019 10:42 AM    Tobacco Status:  Social History   Tobacco Use  Smoking Status Current Every Day Smoker  . Packs/day: 2.00  . Years: 52.00  . Pack years: 104.00  . Types: Cigarettes  Smokeless Tobacco Never Used  Tobacco Comment   8 cigarettes a day 12/05/2019    Patient has failed these meds in past:  Patient is currently controlled on the following medications:  Breztri  2 puffs bid Albuterol 2 puffs q6h prn Duoneb nebules every 6 hours as needed Oxygne 4 L/min      Using maintenance inhaler regularly? Yes   We discussed: patient is still not ready to quit smoking. He reports smoking ~1/2 ppd. Follows with pulmonology and was changed to [facility] with albuterol rescue last visit. Patient reports he experienced an episode of thrush which is mostly resolved. He reports albuterol does not help him as well as Combivent did previously. I reviewed the duplicative antimuscarinic side effects of ipratropium and tiotropium.  Recently completed prednisone burst for SOB. He sees pulmonology again in a few  weeks. He is frustrated with oxygen use.  Plan  Continue current medications  Depression / Anxiety   PHQ9 Score:  PHQ9 SCORE ONLY 09/18/2019 06/05/2019 03/05/2019  PHQ-9 Total Score 3 12 14    GAD7 Score: GAD 7 : Generalized Anxiety Score 09/18/2019 06/05/2019 03/05/2019 07/31/2018  Nervous, Anxious, on Edge 1 3 3 3   Control/stop worrying 0 2 3 3   Worry too much - different things 1 2 3 3   Trouble relaxing 0 2 0 3  Restless 0 2 0 1  Easily annoyed or irritable 1 2 1 3   Afraid - awful might happen 1 2 1 3   Total GAD 7 Score 4 15 11 19   Anxiety Difficulty Not difficult at all Somewhat difficult Somewhat difficult Somewhat difficult    Patient has failed these meds in past:  Patient is currently controlled on the following medications:  . Clonazepam 1 mg bid  We discussed:  Patient reports no longer taking citalopram. He states this was stopped " in the hospital".  He denies any nervousness, anxiety, excessive worrying or irritability.  He expresses frustration at waiting > 3 weeks for PA for his bottom denture. He states he had multiple teeth pulled and it is difficult to eat solid food. He endorses drinking supplements.  Plan  Continue current medications  Cancer pain    Patient has failed these meds in past: NA Patient is currently controlled on the following medications:  . Oxycodone ir 5 mg q8 h prn severe pain . Naloxegol 25 mg daily  We discussed:  Oxycontin discontinued by Oncology. Plan 4 month taper of oxy ir with d/c. Patient tolerating well.  Plan  Continue current medications  Medication Management   Pt uses [facility] for all medications Uses pill box? Fiancee manages meds   Plan  Continue current medication management strategy    Follow up: 3 month phone visit  Junita Push. Kenton Kingfisher PharmD, [facility] [phone]

## 2019-12-24 NOTE — Patient Instructions (Addendum)
Visit Information  It was a pleasure speaking with you today. Thank you for letting me be part of your clinical team. Please call with any questions or concerns.   Goals Addressed            This Visit's Progress   . Pharmacy Care Plan       CARE PLAN ENTRY (see longitudinal plan of care for additional care plan information)  Current Barriers:  . Chronic Disease Management support, education, and care coordination needs related to COPD, Anxiety, and Tobacco use h/o laryngeal cancer, prostate cancer, coronary atherosclerosis, B12 deficiency.   COPD/Tobacco Abuse . Pharmacist Clinical Goal(s) o Over the next 90  days, patient will work with PharmD and providers to minimize symptoms . Current regimen:  o Breztri 2 puffs bid o Albuterol prn o Duoneb q6 h prn . Interventions: o Encouraged smoking cessation o Reviewed MOA of medications o Reviewed importance of rinsing mouth after inhaler use . Patient self care activities - Over the next 90 days, patient will: o Attend all scheduled appointments o Take medication as prescribed  Depression/Anxiety o Pharmacist Clinical Goal(s) o Over the next 90 days, patient will work with PharmD and providers to maximize therapy . Current regimen:  o Clonazepam 1 mg bid . Interventions: o Performed medication review and fill history . Patient self care activities - Over the next 90 days, patient will:  Take medication as prescribed Report any side effects to PCP  Medication management . Pharmacist Clinical Goal(s): o Over the next 90 days, patient will work with PharmD and providers to maintain optimal medication adherence . Current pharmacy: [facility] . Interventions o Comprehensive medication review performed. o Continue current medication management strategy . Patient self care activities - Over the next 90 days, patient will: o Focus on medication adherence by fill dates o Take medications as prescribed o Report any questions or  concerns to PharmD and/or provider(s)  Initial goal documentation        The patient verbalized understanding of instructions, educational materials, and care plan provided today and agreed to receive a mailed copy of patient instructions, educational materials, and care plan.   Telephone follow up appointment with pharmacy team member scheduled for: 3 months  Junita Push. Kenton Kingfisher PharmD, BCPS Clinical Pharmacist [phone]  Generalized Anxiety Disorder, Adult Generalized anxiety disorder (GAD) is a mental health disorder. People with this condition constantly worry about everyday events. Unlike normal anxiety, worry related to GAD is not triggered by a specific event. These worries also do not fade or get better with time. GAD interferes with life functions, including relationships, work, and school. GAD can vary from mild to severe. People with severe GAD can have intense waves of anxiety with physical symptoms (panic attacks). What are the causes? The exact cause of GAD is not known. What increases the risk? This condition is more likely to develop in:  Women.  People who have a family history of anxiety disorders.  People who are very shy.  People who experience very stressful life events, such as the death of a loved one.  People who have a very stressful family environment. What are the signs or symptoms? People with GAD often worry excessively about many things in their lives, such as their health and family. They may also be overly concerned about:  Doing well at work.  Being on time.  Natural disasters.  Friendships. Physical symptoms of GAD include:  Fatigue.  Muscle tension or having muscle twitches.  Trembling  or feeling shaky.  Being easily startled.  Feeling like your heart is pounding or racing.  Feeling out of breath or like you cannot take a deep breath.  Having trouble falling asleep or staying asleep.  Sweating.  Nausea, diarrhea, or  irritable bowel syndrome (IBS).  Headaches.  Trouble concentrating or remembering facts.  Restlessness.  Irritability. How is this diagnosed? Your health care provider can diagnose GAD based on your symptoms and medical history. You will also have a physical exam. The health care provider will ask specific questions about your symptoms, including how severe they are, when they started, and if they come and go. Your health care provider may ask you about your use of alcohol or drugs, including prescription medicines. Your health care provider may refer you to a mental health specialist for further evaluation. Your health care provider will do a thorough examination and may perform additional tests to rule out other possible causes of your symptoms. To be diagnosed with GAD, a person must have anxiety that:  Is out of his or her control.  Affects several different aspects of his or her life, such as work and relationships.  Causes distress that makes him or her unable to take part in normal activities.  Includes at least three physical symptoms of GAD, such as restlessness, fatigue, trouble concentrating, irritability, muscle tension, or sleep problems. Before your health care provider can confirm a diagnosis of GAD, these symptoms must be present more days than they are not, and they must last for six months or longer. How is this treated? The following therapies are usually used to treat GAD:  Medicine. Antidepressant medicine is usually prescribed for long-term daily control. Antianxiety medicines may be added in severe cases, especially when panic attacks occur.  Talk therapy (psychotherapy). Certain types of talk therapy can be helpful in treating GAD by providing support, education, and guidance. Options include: ? Cognitive behavioral therapy (CBT). People learn coping skills and techniques to ease their anxiety. They learn to identify unrealistic or negative thoughts and behaviors  and to replace them with positive ones. ? Acceptance and commitment therapy (ACT). This treatment teaches people how to be mindful as a way to cope with unwanted thoughts and feelings. ? Biofeedback. This process trains you to manage your body's response (physiological response) through breathing techniques and relaxation methods. You will work with a therapist while machines are used to monitor your physical symptoms.  Stress management techniques. These include yoga, meditation, and exercise. A mental health specialist can help determine which treatment is best for you. Some people see improvement with one type of therapy. However, other people require a combination of therapies. Follow these instructions at home:  Take over-the-counter and prescription medicines only as told by your health care provider.  Try to maintain a normal routine.  Try to anticipate stressful situations and allow extra time to manage them.  Practice any stress management or self-calming techniques as taught by your health care provider.  Do not punish yourself for setbacks or for not making progress.  Try to recognize your accomplishments, even if they are small.  Keep all follow-up visits as told by your health care provider. This is important. Contact a health care provider if:  Your symptoms do not get better.  Your symptoms get worse.  You have signs of depression, such as: ? A persistently sad, cranky, or irritable mood. ? Loss of enjoyment in activities that used to bring you joy. ? Change in weight or  eating. ? Changes in sleeping habits. ? Avoiding friends or family members. ? Loss of energy for normal tasks. ? Feelings of guilt or worthlessness. Get help right away if:  You have serious thoughts about hurting yourself or others. If you ever feel like you may hurt yourself or others, or have thoughts about taking your own life, get help right away. You can go to your nearest emergency  department or call:  Your local emergency services (911 in the [place]  A suicide crisis helpline, such as the [place] at (364)311-4448. This is open 24 hours a day. Summary  Generalized anxiety disorder (GAD) is a mental health disorder that involves worry that is not triggered by a specific event.  People with GAD often worry excessively about many things in their lives, such as their health and family.  GAD may cause physical symptoms such as restlessness, trouble concentrating, sleep problems, frequent sweating, nausea, diarrhea, headaches, and trembling or muscle twitching.  A mental health specialist can help determine which treatment is best for you. Some people see improvement with one type of therapy. However, other people require a combination of therapies. This information is not intended to replace advice given to you by your health care provider. Make sure you discuss any questions you have with your health care provider. Document Revised: 12/17/2016 Document Reviewed: 11/25/2015 Elsevier Patient Education  2020 [employer].

## 2019-12-26 NOTE — Telephone Encounter (Signed)
Pt has been r/s  

## 2019-12-27 ENCOUNTER — Other Ambulatory Visit: Payer: Medicare Other | Admitting: Adult Health Nurse Practitioner

## 2019-12-27 ENCOUNTER — Other Ambulatory Visit: Payer: Self-pay

## 2019-12-28 ENCOUNTER — Telehealth: Payer: Self-pay | Admitting: Adult Health Nurse Practitioner

## 2019-12-28 NOTE — Telephone Encounter (Signed)
Spoke with patient's fiancee and scheduled appointment for 03/04/20 @ 12pm Tameka Hoiland K. Olena Heckle NP

## 2019-12-28 NOTE — Telephone Encounter (Signed)
This is late entry Arrived yesterday to home for scheduled visit with no answer at the door.  Called patient and he was with friend whose wife was passing away.  Could not reschedule at that time.  Will call back to reschedule appointment Danile Trier K. Olena Heckle NP

## 2019-12-31 ENCOUNTER — Telehealth: Payer: Self-pay | Admitting: Pulmonary Disease

## 2019-12-31 NOTE — Telephone Encounter (Signed)
Spoke to [place] and relayed below message. Appointment scheduled for 01/01/2020 at 3:00 with Dr. Patsey Berthold. Nothing further needed.

## 2019-12-31 NOTE — Telephone Encounter (Signed)
So he is basically on what he was before.  Issues with his mouth may not be related to thrush but to his head and neck cancer and prior radiation he needs to in person, either me or APP.

## 2019-12-31 NOTE — Telephone Encounter (Signed)
Spoke to patient's spouse, Patti(DPR).  [name] stated that patient stopped breztri due to sore throat and started duoneb as prescribed. [name] stated that patient is taking duoneb QID with no relief in sob. Patient is experiencing increased sob.  Patient is not agreeable with rinsing his mouth out after each each. [name] is concerned that patient may need to resume previous inhalers, as pt did not rinse his mouth and never developed thrush.  Dr. Patsey Berthold, please advise. Thanks

## 2020-01-01 ENCOUNTER — Other Ambulatory Visit: Payer: Self-pay

## 2020-01-01 ENCOUNTER — Encounter: Payer: Self-pay | Admitting: Pulmonary Disease

## 2020-01-01 ENCOUNTER — Ambulatory Visit (INDEPENDENT_AMBULATORY_CARE_PROVIDER_SITE_OTHER): Payer: Medicare Other | Admitting: Pulmonary Disease

## 2020-01-01 VITALS — BP 102/62 | HR 91 | Temp 97.8°F | Ht 68.0 in | Wt 107.8 lb

## 2020-01-01 DIAGNOSIS — J9611 Chronic respiratory failure with hypoxia: Secondary | ICD-10-CM | POA: Diagnosis not present

## 2020-01-01 DIAGNOSIS — R0602 Shortness of breath: Secondary | ICD-10-CM

## 2020-01-01 DIAGNOSIS — J449 Chronic obstructive pulmonary disease, unspecified: Secondary | ICD-10-CM | POA: Diagnosis not present

## 2020-01-01 DIAGNOSIS — R059 Cough, unspecified: Secondary | ICD-10-CM | POA: Diagnosis not present

## 2020-01-01 MED ORDER — PREDNISONE 20 MG PO TABS
ORAL_TABLET | ORAL | 1 refills | Status: DC
Start: 2020-01-01 — End: 2020-04-28

## 2020-01-01 NOTE — Patient Instructions (Signed)
Increase your DuoNeb (nebulizer medication to 5 times a day (every 4 hours)  We have sent in some prednisone for you to take you will take 2 tablets daily for 4 days then 1 tablet daily for 7 days and then a half a tablet until we see you again  We are going to get a CT scan of the chest and an echocardiogram we discussed that these are to look at your lungs and heart  We will see you in follow-up in 4 to 6 weeks time with either me or the nurse practitioner, call sooner should any new problems arise

## 2020-01-04 ENCOUNTER — Ambulatory Visit: Payer: Medicare Other | Admitting: Licensed Clinical Social Worker

## 2020-01-04 DIAGNOSIS — J441 Chronic obstructive pulmonary disease with (acute) exacerbation: Secondary | ICD-10-CM

## 2020-01-04 DIAGNOSIS — E43 Unspecified severe protein-calorie malnutrition: Secondary | ICD-10-CM

## 2020-01-04 DIAGNOSIS — F411 Generalized anxiety disorder: Secondary | ICD-10-CM

## 2020-01-04 DIAGNOSIS — I2510 Atherosclerotic heart disease of native coronary artery without angina pectoris: Secondary | ICD-10-CM

## 2020-01-04 DIAGNOSIS — J449 Chronic obstructive pulmonary disease, unspecified: Secondary | ICD-10-CM

## 2020-01-04 NOTE — Chronic Care Management (AMB) (Signed)
Chronic Care Management    Clinical Social Work Follow Up Note  01/04/2020 Name: Kevin Nelson MRN: [id number] DOB: 28-Nov-1950  Kevin Nelson is a 69 y.o. year old male who is a primary care patient of Cannady, [name], NP. The CCM team was consulted for assistance with Mental Health Counseling and Resources.   Review of patient status, including review of consultants reports, other relevant assessments, and collaboration with appropriate care team members and the patient's provider was performed as part of comprehensive patient evaluation and provision of chronic care management services.    SDOH (Social Determinants of Health) assessments performed: Yes    Outpatient Encounter Medications as of 01/04/2020  Medication Sig  . albuterol (PROVENTIL) (2.5 MG/3ML) 0.083% nebulizer solution Take 3 mLs (2.5 mg total) by nebulization every 6 (six) hours as needed for wheezing or shortness of breath.  Kevin Nelson albuterol (VENTOLIN HFA) 108 (90 Base) MCG/ACT inhaler Inhale 2 puffs into the lungs every 6 (six) hours as needed for wheezing or shortness of breath.  . clonazePAM (KLONOPIN) 1 MG tablet TAKE 1 TABLET(1 MG) BY MOUTH TWICE DAILY AS NEEDED FOR ANXIETY  . fluticasone (FLONASE) 50 MCG/ACT nasal spray SHAKE LIQUID AND USE 1 SPRAY IN EACH NOSTRIL TWICE DAILY  . ipratropium-albuterol (DUONEB) 0.5-2.5 (3) MG/3ML SOLN SMARTSIG:3 Milliliter(s) Via Nebulizer Every 6 Hours PRN  . naloxegol oxalate (MOVANTIK) 25 MG TABS tablet Take 1 tablet (25 mg total) by mouth daily.  Kevin Nelson oxyCODONE (OXY IR/ROXICODONE) 5 MG immediate release tablet Take 1 tablet (5 mg total) by mouth every 8 (eight) hours as needed for severe pain.  . OXYGEN Inhale 3 L into the lungs daily.  . predniSONE (DELTASONE) 20 MG tablet Take 2 tablets daily for 4 days then 1 tablet daily for 7 days then half tablet daily or as directed by MD  . prochlorperazine (COMPAZINE) 10 MG tablet TAKE 1 TABLET(10 MG) BY MOUTH EVERY 6 HOURS AS NEEDED FOR NAUSEA OR  VOMITING   No facility-administered encounter medications on file as of 01/04/2020.     Goals Addressed    . SW-Track and Manage My Symptoms-Depression       Timeframe:  Long-Range Goal Priority:  Medium Start Date: 01/04/20                           Expected End Date: 04/03/20                  Follow Up Date- 90 days from 01/04/20   - avoid negative self-talk - develop a personal safety plan - develop a plan to deal with triggers like holidays, anniversaries - exercise at least 2 to 3 times per week - have a plan for how to handle bad days - journal feelings and what helps to feel better or worse - spend time or talk with others at least 2 to 3 times per week - spend time or talk with others every day - watch for early signs of feeling worse - write in journal every day    Why is this important?    Keeping track of your progress will help your treatment team find the right mix of medicine and therapy for you.   Write in your journal every day.   Day-to-day changes in depression symptoms are normal. It may be more helpful to check your progress at the end of each week instead of every day.     Current Barriers:  Kevin Nelson Knowledge  Deficits related to community resources that are available to him and within his area . Financial Constraints - managing health care expenses  . Health related barriers-cancerous larynx  Clinical Social Worker Clinical Goal(s):  Kevin Nelson Over the next 90 days, patient will verbalize understanding of plan for gaining crisis support resource education and implementing appropriate self-care tools into his daily routine to improve overall self-care . Over the next 90 days, patient will work with [facility] LCSW to address needs related to increasing self-care and self-care education to cope with financial barriers and stressors.    Interventions: . Patient interviewed and appropriate assessments performed . Provided mental health counseling with regard to coping  with daily stress. Kevin Kitchen LCSW provided education on deep breathing and relaxation techniques to implement into his daily routine to combat stressors. Patient admits that his anxiety continues but he has been challenging his negative thinking in order to gain a more positive perspective. He shares that this one coping skill has been effectively working for him. Patient admits that he could benefit from socialization but due to [place] and his poor physical condition, he has been staying at home and taking appropriate precautions. LCSW provided education on available mental health support resources and socialization opportunities within his community. . Provided patient with information about low income housing resources that are available. However, currently Section 8 and Public Housing wait list is still closed.  Kevin Kitchen LCSW provided reflective listening and implemented appropriate interventions to help suppport patient and his emotional needs  . Discussed plans with patient for ongoing care management follow up and provided patient with direct contact information for care management team . Advised patient to follow up with provided community resources as needed . Assisted patient/caregiver with obtaining information about health plan benefits . Patient confirms stable transportation to his upcoming PCP appointment next month. . Patient reports that his anxiety is "more manageable" but he decided to discontinue taking his celexa. Patient was adamant about not getting back on this medication or trying a new one. LCSW advised patient to contact [facility] if his anxiety or depression increases.  . Patient reports ongoing dryness of the mouth and dehydration. However, he is currently drinking pedialyte which has been helpful. Healthy self-care education provided.  . Evaluation of current status of Laryngeal cancer and treatment options. On 08/29/19, patient states that his cancer was successfully treated and he no longer has to  receive chemotherapy.  . Patient reports that he continues to smoke only 5 cigarettes per day but desires to continue decreasing this intake. Positive reinforcement provided.  Kevin Kitchen LCSW reviewed all upcoming medical appointments with patient. Patient reports having [facility] on 11/19/19 where he will remove the last 4 teeth and roots. Patient has already had 4 removed thus far.  . Patient reports that his dermatologist appointment for skin cancer is not until 12/22/19.  Kevin Nelson Patient confirms that he and his spouse received the COVID vaccines. . Patient reports that he has successfully went from 88 lbs to 109 lbs but it has been tricky to eat with his dental issues and low appetite. Patient is no longer receiving chemo or radiation.    . Patient states that he bought himself a new motorcycle. He reports that riding his new bike has increased his overall mood and socialization.  . Patient had a recent pulmonologist appointment with Dr. Vernard Gambles and scheduled a follow up appointment with her as well. He shares  that he received confirmation that his oxygen levels have decreased since his last check 2 years ago. He continues to experience SOB. CCM LCSW provided coping skill education on how to cope with anxiety and difficulty breathing. Education provided on how to induce the relaxation body response to help with deep breathing whenever needed.  . Patient is agreeable to contact PCP to reschedule office visit as he has another appointment that day.  Patient Self Care Activities:  . Attends all scheduled provider appointments . Calls provider office for new concerns or questions  Please see past updates related to this goal by clicking on the "Past Updates" button in the selected goal       Follow Up Plan: SW will follow up with patient by phone over the next quarter  Eula Fried, [place], MSW, [email or web address] Phone: [phone]

## 2020-01-07 DIAGNOSIS — S92332D Displaced fracture of third metatarsal bone, left foot, subsequent encounter for fracture with routine healing: Secondary | ICD-10-CM | POA: Diagnosis not present

## 2020-01-07 DIAGNOSIS — S92322D Displaced fracture of second metatarsal bone, left foot, subsequent encounter for fracture with routine healing: Secondary | ICD-10-CM | POA: Diagnosis not present

## 2020-01-07 DIAGNOSIS — S92342D Displaced fracture of fourth metatarsal bone, left foot, subsequent encounter for fracture with routine healing: Secondary | ICD-10-CM | POA: Diagnosis not present

## 2020-01-09 ENCOUNTER — Other Ambulatory Visit: Payer: Self-pay | Admitting: *Deleted

## 2020-01-09 MED ORDER — NALOXEGOL OXALATE 25 MG PO TABS: 25.0000 mg | ORAL_TABLET | Freq: Every day | ORAL | 0 refills | Status: DC

## 2020-01-14 ENCOUNTER — Ambulatory Visit: Payer: Medicare Other

## 2020-01-15 ENCOUNTER — Other Ambulatory Visit: Payer: Self-pay | Admitting: *Deleted

## 2020-01-15 MED ORDER — OXYCODONE HCL 5 MG PO TABS: 5.0000 mg | ORAL_TABLET | Freq: Three times a day (TID) | ORAL | 0 refills | Status: DC | PRN

## 2020-01-16 ENCOUNTER — Ambulatory Visit
Admission: RE | Admit: 2020-01-16 | Discharge: 2020-01-16 | Disposition: A | Discharge status: Home / Self Care | Payer: Medicare Other | Source: Ambulatory Visit | Attending: Pulmonary Disease | Admitting: Pulmonary Disease

## 2020-01-16 ENCOUNTER — Other Ambulatory Visit: Payer: Self-pay

## 2020-01-16 DIAGNOSIS — R059 Cough, unspecified: Secondary | ICD-10-CM | POA: Diagnosis not present

## 2020-01-16 DIAGNOSIS — R0602 Shortness of breath: Secondary | ICD-10-CM | POA: Insufficient documentation

## 2020-01-17 ENCOUNTER — Other Ambulatory Visit: Payer: Self-pay | Admitting: Hospice and Palliative Medicine

## 2020-01-17 ENCOUNTER — Other Ambulatory Visit: Payer: Self-pay | Admitting: *Deleted

## 2020-01-17 ENCOUNTER — Other Ambulatory Visit: Payer: Self-pay | Admitting: Otolaryngology

## 2020-01-17 DIAGNOSIS — Z8521 Personal history of malignant neoplasm of larynx: Secondary | ICD-10-CM

## 2020-01-17 DIAGNOSIS — R070 Pain in throat: Secondary | ICD-10-CM | POA: Diagnosis not present

## 2020-01-17 MED ORDER — OXYCODONE HCL 5 MG PO TABS: 5.0000 mg | ORAL_TABLET | Freq: Three times a day (TID) | ORAL | 0 refills | Status: DC | PRN

## 2020-01-17 NOTE — Telephone Encounter (Signed)
Prescription signed on 28th printed and patient is now out of pain med

## 2020-01-17 NOTE — Progress Notes (Signed)
Discussed with Dr. Smith Bertrum.  OxyContin has been discontinued.  Plan is to continue oxycodone for another couple of months and then wean.  Will refill oxycodone.  PDMP reviewed.

## 2020-01-21 ENCOUNTER — Ambulatory Visit: Payer: Medicare Other

## 2020-01-24 ENCOUNTER — Ambulatory Visit: Payer: Medicare Other | Admitting: Nurse Practitioner

## 2020-01-28 ENCOUNTER — Ambulatory Visit (INDEPENDENT_AMBULATORY_CARE_PROVIDER_SITE_OTHER): Payer: Medicare Other | Admitting: Nurse Practitioner

## 2020-01-28 ENCOUNTER — Encounter: Payer: Self-pay | Admitting: Nurse Practitioner

## 2020-01-28 ENCOUNTER — Other Ambulatory Visit: Payer: Self-pay

## 2020-01-28 VITALS — BP 93/56 | HR 96 | Temp 99.2°F | Ht 66.22 in | Wt 99.4 lb

## 2020-01-28 DIAGNOSIS — F17210 Nicotine dependence, cigarettes, uncomplicated: Secondary | ICD-10-CM | POA: Diagnosis not present

## 2020-01-28 DIAGNOSIS — J9611 Chronic respiratory failure with hypoxia: Secondary | ICD-10-CM | POA: Diagnosis not present

## 2020-01-28 DIAGNOSIS — J449 Chronic obstructive pulmonary disease, unspecified: Secondary | ICD-10-CM

## 2020-01-28 DIAGNOSIS — F411 Generalized anxiety disorder: Secondary | ICD-10-CM | POA: Diagnosis not present

## 2020-01-28 DIAGNOSIS — R7301 Impaired fasting glucose: Secondary | ICD-10-CM

## 2020-01-28 DIAGNOSIS — D692 Other nonthrombocytopenic purpura: Secondary | ICD-10-CM | POA: Diagnosis not present

## 2020-01-28 DIAGNOSIS — E43 Unspecified severe protein-calorie malnutrition: Secondary | ICD-10-CM

## 2020-01-28 DIAGNOSIS — K739 Chronic hepatitis, unspecified: Secondary | ICD-10-CM | POA: Diagnosis not present

## 2020-01-28 DIAGNOSIS — C329 Malignant neoplasm of larynx, unspecified: Secondary | ICD-10-CM

## 2020-01-28 DIAGNOSIS — I700 Atherosclerosis of aorta: Secondary | ICD-10-CM

## 2020-01-28 MED ORDER — CLONAZEPAM 1 MG PO TABS: ORAL_TABLET | ORAL | 2 refills | Status: DC

## 2020-01-28 NOTE — Assessment & Plan Note (Signed)
In presence of severe COPD and protein calorie malnutrition.  Discussed proper skin care, with gentle cleansing and use of Lubriderm lotion daily.  Monitor for skin breakdown and alert provider if present. 

## 2020-01-28 NOTE — Assessment & Plan Note (Signed)
I have recommended complete cessation of tobacco use. I have discussed various options available for assistance with tobacco cessation including over the counter methods (Nicotine gum, patch and lozenges). We also discussed prescription options (Chantix, Nicotine Inhaler / Nasal Spray). The patient is not interested in pursuing any prescription tobacco cessation options at this time.  

## 2020-01-28 NOTE — Assessment & Plan Note (Signed)
Chronic, ongoing.  Denies SI/HI.  Will continue current palliative regimen.  Patient aware of long term risks of benzos, wishes to continue at this time.  Refills sent in.  Continue to collaborate with palliative team.  Return in 3 months.

## 2020-01-28 NOTE — Assessment & Plan Note (Signed)
Noted on lung CT screening.  Recommend complete cessation of smoking.  Check lipid panel today, does not wish to start statin.

## 2020-01-28 NOTE — Assessment & Plan Note (Addendum)
Ongoing after cancer treatments.  Continue collaboration with oncology and palliative.  Recommend drinking supplements TID. Check labs today.

## 2020-01-28 NOTE — Assessment & Plan Note (Addendum)
Ongoing.  Continue collaboration with oncology & ENT, appreciate their input.  Recent notes reviewed. 

## 2020-01-28 NOTE — Assessment & Plan Note (Signed)
Followed by GI in past with treatment, currently stable.  Continue to monitor and return to GI as needed. 

## 2020-01-28 NOTE — Assessment & Plan Note (Signed)
O2 dependent severe COPD.  Recommended use of O2 24 hours a day as recommended, at 2 L.  Continue to collaborate with pulmonology team.  

## 2020-01-28 NOTE — Progress Notes (Signed)
BP (!) 93/56   Pulse 96   Temp 99.2 F (37.3 C) (Oral)   Ht 5' 6.22" (1.682 m)   Wt 99 lb 6.4 oz (45.1 kg)   SpO2 92% Comment: w/o oxygen  BMI 15.94 kg/m    Subjective:    Patient ID: Kevin Nelson, male    DOB: 09/03/1950, 70 y.o.   MRN: [id number]  HPI: Kevin Nelson is a 70 y.o. male  Chief Complaint  Patient presents with  . COPD   COPD Followed by pulmonary, last saw Dr. Patsey Berthold on 01/01/20.  Continues Albuterol and Duoneb -- Combivent and Stiolto no longer on regimen -- was given Prednisone to take on 01/01/20 -- is to take Duoneb 5 times a day and Albuterol as needed.  He reports missing his Combivent.  Has diagnosis of laryngeal cancer, being followed by oncology and palliative team and receiving treatments with last infusion on 05/31/19 -- last saw Dr. Janese Banks 12/21/19.  Had a feeding tube in place, which was discontinued on 08/31/19.  Last chest CT on 01/16/20.  Last labs noted some mild low HGB, but upward trend.  Continues to smoke about 8 cigarettes a day, not interested in quitting.  Has history of Hep C treated by GI. COPD status: stable Satisfied with current treatment?: yes Oxygen use: yes Dyspnea frequency: occasional Cough frequency: chronic Rescue inhaler frequency:  3-4 times a week Limitation of activity: at times Productive cough: none Last Spirometry: with pulmonary Pneumovax: refused  Influenza: Up To Date  ANXIETY/STRESS Followed by palliative team at this time due to co morbidities and laryngeal CA.  Is taking Klonopin 1 MG BID PRN, last fill on 01/24/19 on PDMP review -- taking one a day Klonopin at this time.  He reports benefit from this regimen.  Risks, benefits, side effects and alternative therapies were discussed.  The opportunity to ask questions was provided and they were answered to the best of my ability.  The patient expressed understanding and willingness to follow the outlined treatment protocols and was able to verbalize plan of care back to  provider.  He also takes Oxycodone 10 MG as needed, prescribed by palliative NP with oncology -- filled 01/17/20 and Oxycontin filled last 12/17/19 -- with plan on review to wean off these over next months.   Duration:stable Anxious mood: yes  Excessive worrying: yes Irritability: no  Sweating: no Nausea: no Palpitations:no Hyperventilation: no Panic attacks: no Agoraphobia: no  Obscessions/compulsions: no Depressed mood: yes Depression screen [facility] 2/9 01/28/2020 09/18/2019 06/05/2019 03/05/2019 01/10/2019  Decreased Interest 0 0 1 3 0  Down, Depressed, Hopeless 0 0 1 3 1   PHQ - 2 Score 0 0 2 6 1   Altered sleeping 0 0 3 3 -  Tired, decreased energy 1 1 1 3  -  Change in appetite 1 2 3 1  -  Feeling bad or failure about yourself  0 0 0 0 -  Trouble concentrating 0 0 1 0 -  Moving slowly or fidgety/restless 0 0 2 0 -  Suicidal thoughts 0 0 0 1 -  PHQ-9 Score 2 3 12 14  -  Difficult doing work/chores Not difficult at all Not difficult at all Not difficult at all Somewhat difficult -  Some recent data might be hidden   GAD 7 : Generalized Anxiety Score 01/28/2020 09/18/2019 06/05/2019 03/05/2019  Nervous, Anxious, on Edge 1 1 3 3   Control/stop worrying 1 0 2 3  Worry too much - different things 1 1 2  3  Trouble relaxing 1 0 2 0  Restless 1 0 2 0  Easily annoyed or irritable 0 1 2 1   Afraid - awful might happen 0 1 2 1   Total GAD 7 Score 5 4 15 11   Anxiety Difficulty Not difficult at all Not difficult at all Somewhat difficult Somewhat difficult   Relevant past medical, surgical, family and social history reviewed and updated as indicated. Interim medical history since our last visit reviewed. Allergies and medications reviewed and updated.  Review of Systems  Constitutional: Negative for activity change, diaphoresis, fatigue and fever.  Respiratory: Positive for cough (baseline) and wheezing (baseline). Negative for chest tightness and shortness of breath.   Cardiovascular: Negative  for chest pain, palpitations and leg swelling.  Gastrointestinal: Negative.   Psychiatric/Behavioral: Negative.     Per HPI unless specifically indicated above     Objective:    BP (!) 93/56   Pulse 96   Temp 99.2 F (37.3 C) (Oral)   Ht 5' 6.22" (1.682 m)   Wt 99 lb 6.4 oz (45.1 kg)   SpO2 92% Comment: w/o oxygen  BMI 15.94 kg/m   Wt Readings from Last 3 Encounters:  01/28/20 99 lb 6.4 oz (45.1 kg)  01/01/20 107 lb 12.8 oz (48.9 kg)  12/21/19 99 lb 1.6 oz (45 kg)    Physical Exam Vitals and nursing note reviewed.  Constitutional:      General: He is awake. He is not in acute distress.    Appearance: He is well-developed and well-groomed. He is cachectic. He is not ill-appearing.     Comments: Frail appearing.  HENT:     Head: Normocephalic and atraumatic.     Right Ear: Hearing normal. No drainage.     Left Ear: Hearing normal. No drainage.  Eyes:     General: Lids are normal.        Right eye: No discharge.        Left eye: No discharge.     Conjunctiva/sclera: Conjunctivae normal.     Pupils: Pupils are equal, round, and reactive to light.  Neck:     Vascular: No carotid bruit.  Cardiovascular:     Rate and Rhythm: Normal rate and regular rhythm.     Heart sounds: Normal heart sounds, S1 normal and S2 normal. No murmur heard. No gallop.   Pulmonary:     Effort: Pulmonary effort is normal. No accessory muscle usage or respiratory distress.     Breath sounds: Decreased breath sounds present.     Comments: Decreased breath sounds throughout, clear.  O2 on at 2L [place]. Abdominal:     General: Bowel sounds are normal.     Palpations: Abdomen is soft.  Musculoskeletal:        General: Normal range of motion.     Cervical back: Normal range of motion and neck supple.     Right lower leg: No edema.     Left lower leg: No edema.  Skin:    General: Skin is warm and dry.     Comments: Scattered small pale purple bruises bilateral upper extremities.  Neurological:      Mental Status: He is alert and oriented to person, place, and time.     Deep Tendon Reflexes: Reflexes are normal and symmetric.  Psychiatric:        Attention and Perception: Attention normal.        Mood and Affect: Mood normal.        Speech: Speech normal.  Behavior: Behavior normal. Behavior is cooperative.        Thought Content: Thought content normal.    Results for orders placed or performed in visit on 12/21/19  Comprehensive metabolic panel  Result Value Ref Range   Sodium 137 135 - 145 mmol/L   Potassium 4.2 3.5 - 5.1 mmol/L   Chloride 93 (L) 98 - 111 mmol/L   CO2 35 (H) 22 - 32 mmol/L   Glucose, Bld 125 (H) 70 - 99 mg/dL   BUN 11 8 - 23 mg/dL   Creatinine, Ser 0.53 (L) 0.61 - 1.24 mg/dL   Calcium 9.0 8.9 - 10.3 mg/dL   Total Protein 7.3 6.5 - 8.1 g/dL   Albumin 4.4 3.5 - 5.0 g/dL   AST 22 15 - 41 U/L   ALT 13 0 - 44 U/L   Alkaline Phosphatase 49 38 - 126 U/L   Total Bilirubin 0.9 0.3 - 1.2 mg/dL   GFR, Estimated >60 >60 mL/min   Anion gap 9 5 - 15  Vitamin B12  Result Value Ref Range   Vitamin B-12 530 180 - 914 pg/mL  Iron and TIBC  Result Value Ref Range   Iron 45 45 - 182 ug/dL   TIBC 371 250 - 450 ug/dL   Saturation Ratios 12 (L) 17.9 - 39.5 %   UIBC 326 ug/dL  Ferritin  Result Value Ref Range   Ferritin 18 (L) 24 - 336 ng/mL  CBC with Differential/Platelet  Result Value Ref Range   WBC 7.1 4.0 - 10.5 K/uL   RBC 4.24 4.22 - 5.81 MIL/uL   Hemoglobin 12.2 (L) 13.0 - 17.0 g/dL   HCT 39.0 39.0 - 52.0 %   MCV 92.0 80.0 - 100.0 fL   MCH 28.8 26.0 - 34.0 pg   MCHC 31.3 30.0 - 36.0 g/dL   RDW 15.4 11.5 - 15.5 %   Platelets 245 150 - 400 K/uL   nRBC 0.0 0.0 - 0.2 %   Neutrophils Relative % 64 %   Neutro Abs 4.5 1.7 - 7.7 K/uL   Lymphocytes Relative 18 %   Lymphs Abs 1.3 0.7 - 4.0 K/uL   Monocytes Relative 14 %   Monocytes Absolute 1.0 0.1 - 1.0 K/uL   Eosinophils Relative 3 %   Eosinophils Absolute 0.2 0.0 - 0.5 K/uL   Basophils Relative 1 %    Basophils Absolute 0.1 0.0 - 0.1 K/uL   Immature Granulocytes 0 %   Abs Immature Granulocytes 0.03 0.00 - 0.07 K/uL      Assessment & Plan:   Problem List Items Addressed This Visit      Cardiovascular and Mediastinum   Senile purpura (HCC)    In presence of severe COPD and protein calorie malnutrition.  Discussed proper skin care, with gentle cleansing and use of Lubriderm lotion daily.  Monitor for skin breakdown and alert provider if present.      Aortic atherosclerosis ([place])    Noted on lung CT screening.  Recommend complete cessation of smoking.  Check lipid panel today, does not wish to start statin.        Respiratory   COPD, very severe ([place]) - Primary    Chronic, ongoing with O2 dependence.  Continue current medication regimen and collaboration with pulmonology.  Recommend complete smoking cessation, although not interested in this at this time.  CBC, TSH, BMP today.  Return in 3 months for follow-up.      Relevant Orders   Basic metabolic panel  Lipid Panel w/o Chol/HDL Ratio   CBC with Differential/Platelet   Chronic hypoxemic respiratory failure (HCC)    O2 dependent severe COPD.  Recommended use of O2 24 hours a day as recommended, at 2 L.  Continue to collaborate with pulmonology team.       Squamous cell carcinoma of larynx ([place])    Ongoing.  Continue collaboration with oncology & ENT, appreciate their input.  Recent notes reviewed.        Digestive   Chronic hepatitis ([place])    Followed by GI in past with treatment, currently stable.  Continue to monitor and return to GI as needed.        Other   Generalized anxiety disorder    Chronic, ongoing.  Denies SI/HI.  Will continue current palliative regimen.  Patient aware of long term risks of benzos, wishes to continue at this time.  Refills sent in.  Continue to collaborate with palliative team.  Return in 3 months.      Nicotine dependence, cigarettes, uncomplicated    I have recommended complete  cessation of tobacco use. I have discussed various options available for assistance with tobacco cessation including over the counter methods (Nicotine gum, patch and lozenges). We also discussed prescription options (Chantix, Nicotine Inhaler / Nasal Spray). The patient is not interested in pursuing any prescription tobacco cessation options at this time.       Protein-calorie malnutrition ([place])    Ongoing after cancer treatments.  Continue collaboration with oncology and palliative.  Recommend drinking supplements TID. Check labs today.      Relevant Orders   Basic metabolic panel   TSH    Other Visit Diagnoses    IFG (impaired fasting glucose)       Elevation glucose noted on recent labs, check A1c today.   Relevant Orders   HgB A1c       Follow up plan: Return in about 3 months (around 04/27/2020) for Anxiety and COPD + Cancer.

## 2020-01-28 NOTE — Patient Instructions (Signed)

## 2020-01-28 NOTE — Assessment & Plan Note (Signed)
Chronic, ongoing with O2 dependence.  Continue current medication regimen and collaboration with pulmonology.  Recommend complete smoking cessation, although not interested in this at this time.  CBC, TSH, BMP today.  Return in 3 months for follow-up.

## 2020-01-29 LAB — CBC WITH DIFFERENTIAL/PLATELET
Basophils Absolute: 0.1 10*3/uL (ref 0.0–0.2)
Basos: 1 %
EOS (ABSOLUTE): 0.1 10*3/uL (ref 0.0–0.4)
Eos: 1 %
Hematocrit: 38.4 % (ref 37.5–51.0)
Hemoglobin: 12.2 g/dL — ABNORMAL LOW (ref 13.0–17.7)
Immature Grans (Abs): 0 10*3/uL (ref 0.0–0.1)
Immature Granulocytes: 0 %
Lymphocytes Absolute: 1.1 10*3/uL (ref 0.7–3.1)
Lymphs: 12 %
MCH: 30 pg (ref 26.6–33.0)
MCHC: 31.8 g/dL (ref 31.5–35.7)
MCV: 94 fL (ref 79–97)
Monocytes Absolute: 1.1 10*3/uL — ABNORMAL HIGH (ref 0.1–0.9)
Monocytes: 13 %
Neutrophils Absolute: 6.3 10*3/uL (ref 1.4–7.0)
Neutrophils: 73 %
Platelets: 225 10*3/uL (ref 150–450)
RBC: 4.07 x10E6/uL — ABNORMAL LOW (ref 4.14–5.80)
RDW: 13.8 % (ref 11.6–15.4)
WBC: 8.7 10*3/uL (ref 3.4–10.8)

## 2020-01-29 LAB — LIPID PANEL W/O CHOL/HDL RATIO
Cholesterol, Total: 207 mg/dL — ABNORMAL HIGH (ref 100–199)
HDL: 92 mg/dL (ref >39)
LDL Chol Calc (NIH): 97 mg/dL (ref 0–99)
Triglycerides: 105 mg/dL (ref 0–149)
VLDL Cholesterol Cal: 18 mg/dL (ref 5–40)

## 2020-01-29 LAB — BASIC METABOLIC PANEL
BUN/Creatinine Ratio: 16 (ref 10–24)
BUN: 9 mg/dL (ref 8–27)
CO2: 34 mmol/L — ABNORMAL HIGH (ref 20–29)
Calcium: 8.9 mg/dL (ref 8.6–10.2)
Chloride: 96 mmol/L (ref 96–106)
Creatinine, Ser: 0.57 mg/dL — ABNORMAL LOW (ref 0.76–1.27)
GFR calc Af Amer: 123 mL/min/{1.73_m2} (ref >59)
GFR calc non Af Amer: 106 mL/min/{1.73_m2} (ref >59)
Glucose: 98 mg/dL (ref 65–99)
Potassium: 5 mmol/L (ref 3.5–5.2)
Sodium: 141 mmol/L (ref 134–144)

## 2020-01-29 LAB — TSH: TSH: 0.864 u[IU]/mL (ref 0.450–4.500)

## 2020-01-29 LAB — HEMOGLOBIN A1C
Est. average glucose Bld gHb Est-mCnc: 100 mg/dL
Hgb A1c MFr Bld: 5.1 % (ref 4.8–5.6)

## 2020-01-29 NOTE — Progress Notes (Signed)
Please let Mr. Kevin Nelson know his labs have returned and overall they are remaining stable, with some ongoing mild anemia which has been present since his treatments and we monitor.  No decline in this.  Kidney function is stable.  Thyroid normal.  Cholesterol labs show mild elevation, but we will continue to monitor these and he may benefit from medication in future to help lower.  A1c shows no diabetes!!  Congrats on that.  Any questions?   Keep being awesome!!  Thank you for allowing me to participate in your care. Kindest regards, Nas Wafer

## 2020-01-31 ENCOUNTER — Ambulatory Visit (INDEPENDENT_AMBULATORY_CARE_PROVIDER_SITE_OTHER): Payer: Medicare Other

## 2020-01-31 ENCOUNTER — Ambulatory Visit (INDEPENDENT_AMBULATORY_CARE_PROVIDER_SITE_OTHER): Payer: Medicare Other | Admitting: Pulmonary Disease

## 2020-01-31 ENCOUNTER — Other Ambulatory Visit: Payer: Self-pay

## 2020-01-31 ENCOUNTER — Encounter: Payer: Self-pay | Admitting: Pulmonary Disease

## 2020-01-31 VITALS — BP 100/60 | HR 94 | Temp 96.9°F | Ht 66.0 in | Wt 100.0 lb

## 2020-01-31 DIAGNOSIS — F17210 Nicotine dependence, cigarettes, uncomplicated: Secondary | ICD-10-CM

## 2020-01-31 DIAGNOSIS — J449 Chronic obstructive pulmonary disease, unspecified: Secondary | ICD-10-CM

## 2020-01-31 DIAGNOSIS — R0602 Shortness of breath: Secondary | ICD-10-CM

## 2020-01-31 DIAGNOSIS — J9611 Chronic respiratory failure with hypoxia: Secondary | ICD-10-CM | POA: Diagnosis not present

## 2020-01-31 DIAGNOSIS — C329 Malignant neoplasm of larynx, unspecified: Secondary | ICD-10-CM

## 2020-01-31 LAB — ECHOCARDIOGRAM COMPLETE
AR max vel: 3.59 cm2
AV Area VTI: 3.39 cm2
AV Area mean vel: 3.49 cm2
AV Mean grad: 2 mmHg
AV Peak grad: 3.1 mmHg
Ao pk vel: 0.88 m/s
Area-P 1/2: 4.31 cm2
Height: 66 in
S' Lateral: 2.9 cm
Single Plane A4C EF: 47.5 %
Weight: 1600 oz

## 2020-01-31 NOTE — Progress Notes (Signed)
Subjective:    Patient ID: Kevin Nelson, male    DOB: [birth year], 70 y.o.   MRN: [id number]  Requesting MD/Service:[name] Ned Card, NP Date of initial consultation:01/25/18, by Dr. Merton Border Reason for consultation:Very severe COPD, smoker  PT PROFILE: 70 y.o.malesmoker (started age 25, maximum 2 PPD) recently moved to the area with history of oxygen dependent COPDfollowing herefor COPD/pulmonary issues  DATA: 02/03/18 6MWT:Submaximal effort. Test terminated after patient reached SPO2 84%. 02/14/18 PFTs:FVC: 2.28 L (56 %pred), FEV1: 0.80 L (25 %pred), FEV1/FVC: 35%, TLC: 7.77 L (121 %pred), DLCO 45 %pred. Flow volume curve consistent with very severe obstruction. No significant change after bronchodilator challenge. 04/14/18 CTA chest:Emphysema with bronchial thickening. Retained mucus in the dependent trachea.Prominent bilateral hilar lymph nodes are likely reactive. 5 mm nodular focus in the dependent left lower lobe is indeterminate for nodule versus atelectasis. 06/19/18 LDCT chest:Lung-RADS 2, benign appearance or behavior.LLL nodules stable.Continue annual screening with low-dose chest CT without contrast in 41months. 07/19/2019 PET/CT: Moderate metabolic activity LEFT lower lobe with rounded focus of atelectasis 5 cm in diameter.  Resolution of the hypermetabolic activity in the larynx and LEFT lymph node.  Possible upper abdominal hypermetabolic activity in the second portion of the duodenum 01/16/2020 chest CT: Extensive emphysema and hyperinflation, previously noted areas of rounded atelectasis have resolved.  No mediastinal adenopathy.  Inflammatory changes with minimal atelectasis left on the left lower lobe at the area of previously noted atelectasis 01/31/2018 one 2D echo: To be performed today, results pending  HPI Is a 70 year old current smoker (8 cigarettes/day) who follows for the issue of very severe COPD chronic respiratory failure with hypoxia.  At  his prior encounter of 17 November we gave him a trial of Breztri however, the patient did not do well with this.  He complained of more hoarseness and cough with it.  After attempting temporizing measures he was switched to DuoNeb 4 times a day and as needed albuterol.  He states he "loves his nebulizer".  Feels much better and his shortness of breath is markedly improved.  Hardly any cough now.  Due to his severe COPD I suspect he did not have breath-holding capacity to perform MDI or DPI maneuvers well.  Because of weight loss a CT scan of the chest was performed on 29 December as actually shows IMPROVEMENT from previously noted left lower lobe rounded atelectasis.  He has very severe advanced emphysema.   Patient unfortunately continues to smoke approximately 8 cigarettes/day.  Also continues to frequent bars and is exposed to secondhand smoke.  He is compliant with his oxygen at 3-1/2 L/min  Review of Systems A 10 point review of systems was performed and it is as noted above otherwise negative.  Patient Active Problem List   Diagnosis Date Noted  . B12 deficiency 11/29/2019  . Goals of care, counseling/discussion 01/25/2019  . Squamous cell carcinoma of larynx ([place]) 01/04/2019  . Chronic nonintractable headache 11/10/2018  . Sensorineural hearing loss ([facility]) 08/21/2018  . Aortic atherosclerosis ([place]) 07/31/2018  . Chronic hepatitis ([place]) 04/21/2018  . Lung nodule < 6cm on CT 04/19/2018  . Senile purpura ([place]) 04/19/2018  . Protein-calorie malnutrition ([place]) 04/19/2018  . Nicotine dependence, cigarettes, uncomplicated 25/85/2778  . Controlled substance agreement signed 01/27/2018  . Chronic hypoxemic respiratory failure ([place]) 01/26/2018  . COPD, very severe ([place]) 01/13/2018  . Prostate cancer ([place]) 01/13/2018  . History of basal cell carcinoma (B[facility]) 01/13/2018  . Generalized anxiety disorder 01/13/2018   No Known Allergies Current Meds  Medication Sig  . albuterol (PROVENTIL)  (2.5 MG/3ML) 0.083% nebulizer solution Take 3 mLs (2.5 mg total) by nebulization every 6 (six) hours as needed for wheezing or shortness of breath.  [name] albuterol (VENTOLIN HFA) 108 (90 Base) MCG/ACT inhaler Inhale 2 puffs into the lungs every 6 (six) hours as needed for wheezing or shortness of breath.  Melene Muller ON 02/23/2020] clonazePAM (KLONOPIN) 1 MG tablet TAKE 1 TABLET(1 MG) BY MOUTH TWICE DAILY AS NEEDED FOR ANXIETY  . fluticasone (FLONASE) 50 MCG/ACT nasal spray SHAKE LIQUID AND USE 1 SPRAY IN EACH NOSTRIL TWICE DAILY  . ipratropium-albuterol (DUONEB) 0.5-2.5 (3) MG/3ML SOLN SMARTSIG:3 Milliliter(s) Via Nebulizer Every 6 Hours PRN  . lidocaine (XYLOCAINE) 2 % solution 15 mL.  . lidocaine-prilocaine (EMLA) cream Apply topically once.  . naloxegol oxalate (MOVANTIK) 25 MG TABS tablet Take 1 tablet (25 mg total) by mouth daily.  [name] oxyCODONE (OXY IR/ROXICODONE) 5 MG immediate release tablet Take 1 tablet (5 mg total) by mouth every 8 (eight) hours as needed for severe pain.  . OXYGEN Inhale 3 L into the lungs daily.  . predniSONE (DELTASONE) 20 MG tablet Take 2 tablets daily for 4 days then 1 tablet daily for 7 days then half tablet daily or as directed by MD  . triamcinolone (KENALOG) 0.1 % Apply topically.   Immunization History  Administered Date(s) Administered  . Influenza-Unspecified 10/18/2017  . Moderna Sars-Covid-2 Vaccination 08/13/2019, 09/13/2019   Social History   Tobacco Use  . Smoking status: Current Every Day Smoker    Packs/day: 2.00    Years: 52.00    Pack years: 104.00    Types: Cigarettes  . Smokeless tobacco: Never Used  . Tobacco comment: 8 cigarettes a day 01/31/2020  Substance Use Topics  . Alcohol use: Not Currently    Alcohol/week: 3.0 standard drinks    Types: 3 Cans of beer per week        Objective:   Physical Exam BP 100/60 (BP Location: Left Arm, Cuff Size: Normal)   Pulse 94   Temp (!) 96.9 F (36.1 C) (Temporal)   Ht 5\' 6"  (1.676 m)   Wt 100  lb (45.4 kg)   SpO2 94%   BMI 16.14 kg/m   GENERAL: Thin/cachectic gentleman, chronically ill-appearing, with chronic use of accessories, no overt acute distress.  Presents in transport chair. HEAD: Normocephalic, atraumatic.  EYES: Pupils equal, round, reactive to light.  No scleral icterus.  MOUTH: Nose/mouth/throat not examined due to masking requirements for COVID 19.  Known poor dentition. NECK: Supple. No thyromegaly. Trachea midline. No JVD.  No adenopathy. PULMONARY: Distant breath sounds.  No adventitious sounds, poor air entry. CARDIOVASCULAR: S1 and S2. Regular rate and rhythm.  No rubs, murmurs or gallops heard. ABDOMEN: Scaphoid, nondistended. MUSCULOSKELETAL: No joint deformity, no clubbing, no edema.  NEUROLOGIC: No overt focal deficit.  Speech is fluent. SKIN: Intact,warm,dry.  Limited exam, no rashes PSYCH: Mood and behavior normal.  Representative slides from 19 July 2019 PET/CT showing rounded atelectasis left lower lobe:     Representative slice of most recent CT on 16 January 2020, no rounded atelectasis noted.  Prior atelectasis likely due to mucous plugging.  Minimal residual atelectatic changes persist:   Assessment & Plan:     ICD-10-CM   1. COPD, very severe (HCC)  J44.9    Continue DuoNeb 4 times a day and as needed albuterol nebulizer Stop smoking  2. Shortness of breath  R06.02    2D echo to be  done today to exclude pulmonary hypertension This is likely due to very severe emphysema  3. Chronic hypoxemic respiratory failure (HCC)  J96.11    Continue oxygen supplementation  4. Squamous cell carcinoma of larynx (HCC)  C32.9    Will have PET/CT on 18 January Followed by oncology  5. Tobacco dependence due to cigarettes  F17.210    Patient was counseled regards to discontinuation of smoking Total counseling time 3 to 5 minutes   Smoking cessation instruction/counseling given:  counseled patient on the dangers of tobacco use, advised patient to stop  smoking, and reviewed strategies to maximize success.  Discussion:  The patient appears to be doing better on nebulizer treatments.  He lacks breath-holding capacity to use MDI/DPI's effectively.  Continue current regimen.  He is to continue oxygen supplementation as well.  He was counseled extensively with regards to discontinuation of smoking.  His weight loss may be related to COPD cachexia as his COPD is quite advanced.  Most recent CT of the chest shows improvement on the left lower lobe atelectasis noted on prior PET/CT.  He has extensive emphysema.  Currently he is not ready to discuss advanced directives.  Continue to address on follow-up visits.  2D echo to be performed today, we will follow-up on results.  Follow-up will be in 3 months time he is to contact us prior to that time should any difficulties arise.  Renold Don, MD [facility]   *This note was dictated using voice recognition software/Dragon.  Despite best efforts to proofread, errors can occur which can change the meaning.  Any change was purely unintentional.

## 2020-01-31 NOTE — Patient Instructions (Signed)
Continue your medications as they are   We will see in follow-up in 3 months time call sooner should any new problems arise

## 2020-02-01 ENCOUNTER — Telehealth: Payer: Self-pay

## 2020-02-01 ENCOUNTER — Telehealth: Payer: Self-pay | Admitting: *Deleted

## 2020-02-01 ENCOUNTER — Ambulatory Visit (INDEPENDENT_AMBULATORY_CARE_PROVIDER_SITE_OTHER): Payer: Medicare Other

## 2020-02-01 VITALS — Ht 67.0 in | Wt 100.0 lb

## 2020-02-01 DIAGNOSIS — Z0000 Encounter for general adult medical examination without abnormal findings: Secondary | ICD-10-CM

## 2020-02-01 NOTE — Telephone Encounter (Signed)
[name] called reporting that patient is to have a PET Scan Wednesday ordered by Dr Richardson Landry who found something in his throat. He also wants patient to go to [facility] for a biopsy. He was not comfortable with having biopsy. She wants Dr Janese Banks to look at PET and call her with her opinion regarding a biopsy.

## 2020-02-01 NOTE — Telephone Encounter (Signed)
We will wait for the pet but if dr Richardson Landry has found something, it is in his best interest to get the biopsy.

## 2020-02-01 NOTE — Telephone Encounter (Signed)
Patient consented to telehealth visit. 

## 2020-02-01 NOTE — Progress Notes (Signed)
I connected with Kevin Nelson today by telephone and verified that I am speaking with the correct person using two identifiers. Location patient: home Location provider: work Persons participating in the virtual visit: Kevin Nelson, Glenna Durand LPN.   I discussed the limitations, risks, security and privacy concerns of performing an evaluation and management service by telephone and the availability of in person appointments. I also discussed with the patient that there may be a patient responsible charge related to this service. The patient expressed understanding and verbally consented to this telephonic visit.    Interactive audio and video telecommunications were attempted between this provider and patient, however failed, due to patient having technical difficulties OR patient did not have access to video capability.  We continued and completed visit with audio only.     Vital signs may be patient reported or missing  Subjective:   Kevin Nelson is a 70 y.o. male who presents for Medicare Annual/Subsequent preventive examination.  Review of Systems     Cardiac Risk Factors include: advanced age (>58men, >15 women);male gender;sedentary lifestyle     Objective:    Today's Vitals   02/01/20 1117  Weight: 100 lb (45.4 kg)  Height: 5\' 7"  (1.702 m)   Body mass index is 15.66 kg/m.  Advanced Directives 02/01/2020 09/20/2019 07/11/2019 06/21/2019 05/31/2019 05/21/2019 05/14/2019  Does Patient Have a Medical Advance Directive? Yes No No Yes No;Yes Yes Yes  Type of [employer] [place];Living will - Healthcare Power of [place]  Does patient want to make changes to medical advance directive? - - No - Patient declined - - - -  Copy of [place] in Chart? No - copy requested - No - copy requested - - No - copy requested No - copy requested  Would patient like  information on creating a medical advance directive? - - No - Patient declined - No - Patient declined - -    Current Medications (verified) Outpatient Encounter Medications as of 02/01/2020  Medication Sig  . albuterol (PROVENTIL) (2.5 MG/3ML) 0.083% nebulizer solution Take 3 mLs (2.5 mg total) by nebulization every 6 (six) hours as needed for wheezing or shortness of breath.  [name] albuterol (VENTOLIN HFA) 108 (90 Base) MCG/ACT inhaler Inhale 2 puffs into the lungs every 6 (six) hours as needed for wheezing or shortness of breath.  Derrill Memo ON 02/23/2020] clonazePAM (KLONOPIN) 1 MG tablet TAKE 1 TABLET(1 MG) BY MOUTH TWICE DAILY AS NEEDED FOR ANXIETY  . fluticasone (FLONASE) 50 MCG/ACT nasal spray SHAKE LIQUID AND USE 1 SPRAY IN EACH NOSTRIL TWICE DAILY  . ipratropium-albuterol (DUONEB) 0.5-2.5 (3) MG/3ML SOLN SMARTSIG:3 Milliliter(s) Via Nebulizer Every 6 Hours PRN  . lidocaine (XYLOCAINE) 2 % solution 15 mL.  . lidocaine-prilocaine (EMLA) cream Apply topically once.  . naloxegol oxalate (MOVANTIK) 25 MG TABS tablet Take 1 tablet (25 mg total) by mouth daily.  [name] oxyCODONE (OXY IR/ROXICODONE) 5 MG immediate release tablet Take 1 tablet (5 mg total) by mouth every 8 (eight) hours as needed for severe pain.  . OXYGEN Inhale 3 L into the lungs daily.  . predniSONE (DELTASONE) 20 MG tablet Take 2 tablets daily for 4 days then 1 tablet daily for 7 days then half tablet daily or as directed by MD  . triamcinolone (KENALOG) 0.1 % Apply topically.   No facility-administered encounter medications on file as of 02/01/2020.    Allergies (verified)  Patient has no known allergies.   History: Past Medical History:  Diagnosis Date  . Anxiety   . Basal cell carcinoma   . Cigarette smoker   . Dental decay   . Dysphagia   . Dyspnea   . End stage COPD ([place])    end stage copd, emphysema  . Erectile dysfunction   . Headache    every morning  . Hepatitis C    treated for 8 weeks this past year with  Mayvret  . History of nonmelanoma skin cancer   . Hoarseness, chronic   . Myocardial infarction [facility]) march/april 2020   mild heart attack  . Prostate cancer ([place])   . Respiratory failure, acute ([place])    was inpt for 5 days. thought he was going to die.  . Sleep apnea   . Squamous cell skin cancer   . Throat cancer ([place])   . Use of leuprolide acetate (Lupron)    treated for prostate cancer.   . 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   Past Surgical History:  Procedure Laterality Date  . FACIAL LACERATION REPAIR    . HERNIA REPAIR Left 1992   inguinal  . IR GASTROSTOMY TUBE MOD SED  04/26/2019  . MICROLARYNGOSCOPY N/A 01/17/2019   Procedure: MICROLARYNGOSCOPY WITH BX;  Surgeon: Clyde Canterbury, MD;  Location: [facility] ORS;  Service: ENT;  Laterality: N/A;  . PORTA CATH INSERTION N/A 03/15/2019   Procedure: PORTA CATH INSERTION;  Surgeon: Algernon Huxley, MD;  Location: [place] [facility];  Service: Cardiovascular;  Laterality: N/A;  . PORTA CATH REMOVAL N/A 10/08/2019   Procedure: PORTA CATH REMOVAL;  Surgeon: Algernon Huxley, MD;  Location: [place] [facility];  Service: Cardiovascular;  Laterality: N/A;  . PROSTATE BIOPSY    . SKIN CANCER EXCISION    . TRIGGER FINGER RELEASE Left 10/2018   Family History  Problem Relation Age of Onset  . Cancer Sister   . Cancer - Cervical Mother   . Heart attack Brother   . Heart attack Maternal Grandfather    Social History   Socioeconomic History  . Marital status: Significant Other    Spouse name: [name], girlfriend  . Number of children: Not on file  . Years of education: Not on file  . Highest education level: Not on file  Occupational History  . Occupation: Chief Strategy Officer    Comment: retired  Tobacco Use  . Smoking status: Current Every Day Smoker    Packs/day: 0.50    Years: 52.00    Pack years: 26.00    Types: Cigarettes  . Smokeless tobacco: Never Used  . Tobacco comment: 8 cigarettes a day 01/31/2020  Vaping Use  .  Vaping Use: Never used  Substance and Sexual Activity  . Alcohol use: Not Currently    Alcohol/week: 3.0 standard drinks    Types: 3 Cans of beer per week  . Drug use: Never  . Sexual activity: Yes  Other Topics Concern  . Not on file  Social History Narrative   Patient has retired d/t respiratory status. Currently lives with girlfriend    Social Determinants of Health   Financial Resource Strain: Low Risk   . Difficulty of Paying Living Expenses: Not hard at all  Food Insecurity: No Food Insecurity  . Worried About Charity fundraiser in the Last Year: Never true  . Ran Out of Food in the Last Year: Never true  Transportation Needs: No Transportation Needs  .  Lack of Transportation (Medical): No  . Lack of Transportation (Non-Medical): No  Physical Activity: Inactive  . Days of Exercise per Week: 0 days  . Minutes of Exercise per Session: 0 min  Stress: Stress Concern Present  . Feeling of Stress : Very much  Social Connections: Not on file    Tobacco Counseling Ready to quit: Yes Counseling given: Not Answered Comment: 8 cigarettes a day 01/31/2020   Clinical Intake:  Pre-visit preparation completed: Yes  Pain : No/denies pain     Nutritional Status: BMI <19  Underweight Nutritional Risks: None Diabetes: No  How often do you need to have someone help you when you read instructions, pamphlets, or other written materials from your doctor or pharmacy?: 1 - Never What is the last grade level you completed in school?: 12th grade  Diabetic? no  Interpreter Needed?: No  Information entered by :: NAllen LPN   Activities of Daily Living In your present state of health, do you have any difficulty performing the following activities: 02/01/2020 01/28/2020  Hearing? Tempie Donning  Vision? Y Y  Difficulty concentrating or making decisions? Y Y  Comment memory -  Walking or climbing stairs? Y N  Comment sob -  Dressing or bathing? N N  Doing errands, shopping? N N   Preparing Food and eating ? N -  Using the Toilet? N -  In the past six months, have you accidently leaked urine? N -  Do you have problems with loss of bowel control? N -  Managing your Medications? N -  Comment someone just makes sure that he took them -  Managing your Finances? N -  Housekeeping or managing your Housekeeping? N -  Some recent data might be hidden    Patient Care Team: [name], NP as PCP - General (Nurse Practitioner) Greg Cutter, LCSW as Social Worker (Licensed Clinical Social Worker) Vanita Ingles, RN as Case Manager ([place]) Vladimir Faster, [facility] (Pharmacist)  Indicate any recent Medical Services you may have received from other than [facility] providers in the past year (date may be approximate).     Assessment:   This is a routine wellness examination for Monty.  Hearing/Vision screen  Hearing Screening   125Hz  250Hz  500Hz  1000Hz  2000Hz  3000Hz  4000Hz  6000Hz  8000Hz   Right ear:           Left ear:           Vision Screening Comments: No regular eye exams  Dietary issues and exercise activities discussed: Current Exercise Habits: The patient does not participate in regular exercise at present  Goals    .  Patient Stated      02/01/2020, wants to quit smoking    .  Pharm D "I want to take care of myself" (pt-stated)      Current Barriers:  . Polypharmacy; complex patient with multiple comorbidities including COPD, tobacco abuse, prostate cancer, s/p tx for Hep C; current tx for laryngeal cancer. Patient notes today significant frustration at having to wait until 10/29/19 to have his bottom teeth extracted. Notes he has teeth breaking off at the root and it makes it difficult to eat. He states he has been able to gain a little weight and credits his fiancee for taking great care of him. His PEG tube and portacath have been removed. He has completed chemo and radiation and PET scan showed no further evidence of malignancy. He is excellent  spirits and states "I feel good."  .  Palliative care team [name], NP involved. Last visit 10/09/19 . Notes that his finance, [name], has been helping manage his medications and diet at home  Squamous cell carcinoma of larynx: seen by [facility] pulm/ENT but following w/ [facility] currently - Pain: Oncologist has initiated three month taper of pain meds with plan to d/c at that time. Will discontinue oxycontin in one month. Current regimen oxycodone ER 10 mg BID, oxycodone IR 5mg  q8h prn. Patient reports he is doing well with reduced dose and denies symptoms of withdrawal. - Bowel regimen: senokot syrup PRN o COPD/tobacco abuse: Stiolto daily, Duonebs or Combivent as needed. Follows w/ Dr. Camillo Flaming pulmonary. Indicates he and fiancee had second COVID vaccine ~ three weeks ago and has cough, body aches angd lethargy. Reports a similar experience with the first dose. o S/p tx Hep C: s/p Mayvret. o Anxiety: clonazepam 1 mg BID; Citalopram 20 mg qd o B12 deficiency. Cyanocobalamin 1000 mcg monthly. Last level 263 04/09/19 will repeat in December per Dr. Janese Banks  Pharmacist Clinical Goal(s):  [name] Over the next 90 days, patient will work with PharmD and provider towards optimized medication management  Interventions: . Comprehensive medication review performed, medication list updated in electronic medical record . Inter-disciplinary care team collaboration (see longitudinal plan of care)  Patient Self Care Activities:  . Patient will take medications as prescribed  Please see past updates related to this goal by clicking on the "Past Updates" button in the selected goal       .  [place] (see longitudinal plan of care for additional care plan information)  Current Barriers:  . Chronic Disease Management support, education, and care coordination needs related to COPD, Anxiety, and Tobacco use h/o laryngeal cancer, prostate cancer, coronary atherosclerosis, B12  deficiency.   COPD/Tobacco Abuse . Pharmacist Clinical Goal(s) o Over the next 90  days, patient will work with PharmD and providers to minimize symptoms . Current regimen:  o Breztri 2 puffs bid o Albuterol prn o Duoneb q6 h prn . Interventions: o Encouraged smoking cessation o Reviewed MOA of medications o Reviewed importance of rinsing mouth after inhaler use . Patient self care activities - Over the next 90 days, patient will: o Attend all scheduled appointments o Take medication as prescribed  Depression/Anxiety o Pharmacist Clinical Goal(s) o Over the next 90 days, patient will work with PharmD and providers to maximize therapy . Current regimen:  o Clonazepam 1 mg bid . Interventions: o Performed medication review and fill history . Patient self care activities - Over the next 90 days, patient will:  Take medication as prescribed Report any side effects to PCP  Medication management . Pharmacist Clinical Goal(s): o Over the next 90 days, patient will work with PharmD and providers to maintain optimal medication adherence . Current pharmacy: [facility] . Interventions o Comprehensive medication review performed. o Continue current medication management strategy . Patient self care activities - Over the next 90 days, patient will: o Focus on medication adherence by fill dates o Take medications as prescribed o Report any questions or concerns to PharmD and/or provider(s)  Initial goal documentation     .  RN-COPD Management/Laryngeal Cancer diagnosis (pt-stated)      Current Barriers:  [name] Knowledge deficits related to basic COPD self care/management . Knowledge deficits related to Laryngeal Cancer and self/care management  . Financial barriers . Limited Social Support   Case Manager Clinical Goal(s):  Over  the next 90 days, patient will be able to verbalize understanding of COPD action plan and when to seek appropriate levels of medical care  Over the next 90  days, patient will engage in lite exercise as tolerated to build/regain stamina and strength and reduce shortness of breath through activity tolerance  Over the next 90 days, patient will verbalize basic understanding of COPD disease process and self care activities  Over the next 90 days the patient witl verbalized basic understanding of Laryngeal Cancer disease process and self care activities    Interventions:  . Reviewed COPD action plan . Patient reports having all COPD medications . Reviewed upcoming appointment with Pulmonology.  The patient is doing some "soul searching" and talking to his fiance about important decisions that need to be made concerning his health and well being/care.  09-05-2019: The patient states he is doing well and happy that he is over his treatments for his cancer. He is moving forward with going to the dentist and getting his teeth fixed. Recently he had his g tube removed and he says the area is sore but he denies any acute distress. He is eating well.   . Patient reporting he is taking his medications as directed . Continues to smoke, with no interest in quitting- 09-05-2019: Is smoking about 5 cigarettes a day. Will continue to work on cutting back . Continues to be on 02 . Evaluation of current status of Laryngeal cancer and treatment options.  09-05-2019: The patient has completed his chemotherapy. He feels he is doing well.  . The patient has completed his chemotherapy regimen and is now taking radiation. The patient is feeling much better. 09-05-2019: The patient has an upcoming appointment in [facility] to have teeth pulled. 09-19-2019.  The patient has top dentures but wants to have bottom teeth removed so he can get bottom dentures and be able to eat a regular diet again.  . Evaluation of anxiety.  The patient states he is feeling better each day. He wants to keep doing things he is enjoying and stay safe. He has had some "cold-like" symptoms since getting the  first COVID vaccine but denies any acute distress.  . Evaluation of smoking. The patient verbalized he is down to 5 cigarettes a day.  The patient admits that is not enough but it is better than what it was. Praised the patient for small accomplishments. The patient was happier on the call today and thanked the [facility] for ongoing support and help.  . Reports that he has had his first COVID vaccine and gets his second one on 09-13-2019.  [name] Evaluation of upcoming appointments. Next appointment with pcp on 09-18-2019 at [facility] with pcp.    Patient Self Care Activities:  Takes medications as prescribed including inhalers  Utilizes infection prevention strategies to reduce risk of respiratory infection   Does not currently perform self care activities related to COPD management  Please see past updates related to this goal by clicking on the "Past Updates" button in the selected goal      .  SW-"I need more support. I'm anxious all the time" (pt-stated)      Current Barriers:  [name] Knowledge Deficits related to community resources that are available to him and within his area . Financial Constraints - managing health care expenses  . Health related barriers-cancerous larynx  Clinical Social Worker Clinical Goal(s):  [name] Over the next 90 days, patient will verbalize understanding of plan for gaining crisis support  resource education and implementing appropriate self-care tools into his daily routine to improve overall self-care . Over the next 90 days, patient will work with [facility] LCSW to address needs related to increasing self-care and self-care education to cope with financial barriers and stressors.      Interventions: . Patient interviewed and appropriate assessments performed . Provided mental health counseling with regard to coping with daily stress. [name] LCSW provided education on deep breathing and relaxation techniques to implement into his daily routine to combat stressors. Patient admits  that his anxiety continues but he has been challenging his negative thinking in order to gain a more positive perspective. Patient admits that he could benefit from socialization but due to [place] he has been staying at home and taking appropriate precautions. LCSW provided education on available mental health support resources and socialization opportunities within his community. . Provided patient with information about low income housing resources that are available. However, currently Section 8 and Public Housing wait list is still closed.  [name] LCSW provided reflective listening and implemented appropriate interventions to help suppport patient and his emotional needs  . Discussed plans with patient for ongoing care management follow up and provided patient with direct contact information for care management team . Advised patient to follow up with provided community resources as needed . Assisted patient/caregiver with obtaining information about health plan benefits . Patient confirms stable transportation to his upcoming PCP appointment next month. . Patient admits ongoing SOB. LCSW provided education on ways to induce the relaxation body response to help with deep breathing.  . Patient reports that his anxiety is "more manageable" but he decided to discontinue taking his celexa. Patient was adamant about not getting back on this medication. LCSW advised patient to contact [facility] if his anxiety or depression increase.   . Patient reports ongoing dryness of the mouth and dehydration. However, he is currently drinking pedialyte which has been helpful. Healthy self-care education provided.  . Evaluation of current status of Laryngeal cancer and treatment options. On 08/29/19, patient states that his cancer was successfully treated and he no longer has to receive chemotherapy.  . Patient reports that he continues to smoke only 5 cigarettes per day but desires to continue decreasing this intake. Positive  reinforcement provided.  [name] LCSW reviewed all upcoming medical appointments with patient. Patient reports [facility] where he will remove the last 4 teeth and roots. Patient has already had 4 removed thus far.  . Patient reports that his dermatologist appointment for skin cancer is not until 12/22/19.  [name] Patient confirms that he and his spouse received the COVID vaccines. . Patient reports that he has successfully went from 88 lbs to 109 lbs but it has been tricky to eat with his dental issues and low appetite. Patient is no longer receiving chemo or radiation.    . Patient states that he bought himself a new motorcycle. He reports that riding his new bike has increased his overall mood and socialization.   Patient Self Care Activities:  . Attends all scheduled provider appointments . Calls provider office for new concerns or questions  Please see past updates related to this goal by clicking on the "Past Updates" button in the selected goal      .  SW-Track and Manage My Symptoms-Depression      Timeframe:  Long-Range Goal Priority:  Medium Start Date: 01/04/20  Expected End Date: 04/03/20                  Follow Up Date- 90 days from 01/04/20   - avoid negative self-talk - develop a personal safety plan - develop a plan to deal with triggers like holidays, anniversaries - exercise at least 2 to 3 times per week - have a plan for how to handle bad days - journal feelings and what helps to feel better or worse - spend time or talk with others at least 2 to 3 times per week - spend time or talk with others every day - watch for early signs of feeling worse - write in journal every day    Why is this important?    Keeping track of your progress will help your treatment team find the right mix of medicine and therapy for you.   Write in your journal every day.   Day-to-day changes  in depression symptoms are normal. It may be more helpful to check your progress at the end of each week instead of every day.     Current Barriers:  [name] Knowledge Deficits related to community resources that are available to him and within his area . Financial Constraints - managing health care expenses  . Health related barriers-cancerous larynx  Clinical Social Worker Clinical Goal(s):  [name] Over the next 90 days, patient will verbalize understanding of plan for gaining crisis support resource education and implementing appropriate self-care tools into his daily routine to improve overall self-care . Over the next 90 days, patient will work with [facility] LCSW to address needs related to increasing self-care and self-care education to cope with financial barriers and stressors.    Interventions: . Patient interviewed and appropriate assessments performed . Provided mental health counseling with regard to coping with daily stress. [name] LCSW provided education on deep breathing and relaxation techniques to implement into his daily routine to combat stressors. Patient admits that his anxiety continues but he has been challenging his negative thinking in order to gain a more positive perspective. He shares that this one coping skill has been effectively working for him. Patient admits that he could benefit from socialization but due to [place] and his poor physical condition, he has been staying at home and taking appropriate precautions. LCSW provided education on available mental health support resources and socialization opportunities within his community. . Provided patient with information about low income housing resources that are available. However, currently Section 8 and Public Housing wait list is still closed.  [name] LCSW provided reflective listening and implemented appropriate interventions to help suppport patient and his emotional needs  . Discussed plans with patient for ongoing care management  follow up and provided patient with direct contact information for care management team . Advised patient to follow up with provided community resources as needed . Assisted patient/caregiver with obtaining information about health plan benefits . Patient confirms stable transportation to his upcoming PCP appointment next month. . Patient reports that his anxiety is "more manageable" but he decided to discontinue taking his celexa. Patient was adamant about not getting back on this medication or trying a new one. LCSW advised patient to contact [facility] if his anxiety or depression increases.  . Patient reports ongoing dryness of the mouth and dehydration. However, he is currently drinking pedialyte which has been helpful. Healthy self-care education provided.  . Evaluation of current status of Laryngeal cancer and treatment options. On 08/29/19, patient states that his cancer  was successfully treated and he no longer has to receive chemotherapy.  . Patient reports that he continues to smoke only 5 cigarettes per day but desires to continue decreasing this intake. Positive reinforcement provided.  [name] LCSW reviewed all upcoming medical appointments with patient. Patient reports having an upcoming dental appointment at [facility] on 11/19/19 where he will remove the last 4 teeth and roots. Patient has already had 4 removed thus far.  . Patient reports that his dermatologist appointment for skin cancer is not until 12/22/19.  [name] Patient confirms that he and his spouse received the COVID vaccines. . Patient reports that he has successfully went from 88 lbs to 109 lbs but it has been tricky to eat with his dental issues and low appetite. Patient is no longer receiving chemo or radiation.    . Patient states that he bought himself a new motorcycle. He reports that riding his new bike has increased his overall mood and socialization.  . Patient had a recent pulmonologist appointment with Dr.  Vernard Gambles and scheduled a follow up appointment with her as well. He shares that he received confirmation that his oxygen levels have decreased since his last check 2 years ago. He continues to experience SOB. CCM LCSW provided coping skill education on how to cope with anxiety and difficulty breathing. Education provided on how to induce the relaxation body response to help with deep breathing whenever needed.  . Patient is agreeable to contact PCP to reschedule office visit as he has another appointment that day.  Patient Self Care Activities:  . Attends all scheduled provider appointments . Calls provider office for new concerns or questions  Please see past updates related to this goal by clicking on the "Past Updates" button in the selected goal        Depression Screen PHQ 2/9 Scores 02/01/2020 01/28/2020 09/18/2019 06/05/2019 03/05/2019 01/10/2019 07/31/2018  PHQ - 2 Score 0 0 0 2 6 1 6   PHQ- 9 Score - 2 3 12 14  - 19    Fall Risk Fall Risk  02/01/2020 01/28/2020 01/10/2019 07/31/2018  Falls in the past year? 0 0 0 0  Number falls in past yr: - - 0 0  Injury with Fall? - - 0 0  Risk for fall due to : Medication side effect History of fall(s) - -  Follow up Falls evaluation completed;Education provided;Falls prevention discussed - - Falls evaluation completed    FALL RISK PREVENTION PERTAINING TO THE HOME:  Any stairs in or around the home? No  If so, are there any without handrails? n/a Home free of loose throw rugs in walkways, pet beds, electrical cords, etc? Yes  Adequate lighting in your home to reduce risk of falls? Yes   ASSISTIVE DEVICES UTILIZED TO PREVENT FALLS:  Life alert? No  Use of a cane, walker or w/c? No  Grab bars in the bathroom? Yes  Shower chair or bench in shower? Yes  Elevated toilet seat or a handicapped toilet? No   TIMED UP AND GO:  Was the test performed? No   Cognitive Function:     6CIT Screen 02/01/2020  What Year? 0 points  What month?  0 points  What time? 0 points  Count back from 20 0 points  Months in reverse 0 points  Repeat phrase 2 points  Total Score 2    Immunizations Immunization History  Administered Date(s) Administered  . Influenza-Unspecified 10/18/2017  . [place] Sars-Covid-2 Vaccination  08/13/2019, 09/13/2019    TDAP status: Due, Education has been provided regarding the importance of this vaccine. Advised may receive this vaccine at local pharmacy or Health Dept. Aware to provide a copy of the vaccination record if obtained from local pharmacy or Health Dept. Verbalized acceptance and understanding.  Flu Vaccine status: Declined, Education has been provided regarding the importance of this vaccine but patient still declined. Advised may receive this vaccine at local pharmacy or Health Dept. Aware to provide a copy of the vaccination record if obtained from local pharmacy or Health Dept. Verbalized acceptance and understanding.  Pneumococcal vaccine status: Declined,  Education has been provided regarding the importance of this vaccine but patient still declined. Advised may receive this vaccine at local pharmacy or Health Dept. Aware to provide a copy of the vaccination record if obtained from local pharmacy or Health Dept. Verbalized acceptance and understanding.   Covid-19 vaccine status: Completed vaccines. Refuses booster  Qualifies for Shingles Vaccine? Yes   Zostavax completed No   Shingrix Completed?: No.    Education has been provided regarding the importance of this vaccine. Patient has been advised to call insurance company to determine out of pocket expense if they have not yet received this vaccine. Advised may also receive vaccine at local pharmacy or Health Dept. Verbalized acceptance and understanding.  Screening Tests Health Maintenance  Topic Date Due  . COVID-19 Vaccine (3 - Moderna risk 4-dose series) 02/13/2020 (Originally 10/11/2019)  . TETANUS/TDAP  03/04/2020 (Originally  10/18/1969)  . PNA vac Low Risk Adult (1 of 2 - PCV13) 03/04/2020 (Originally 10/17/2017)  . INFLUENZA VACCINE  04/17/2020 (Originally 08/19/2019)  . COLONOSCOPY (Pts 45-29yrs Insurance coverage will need to be confirmed)  01/27/2021 (Originally 10/17/1997)  . Hepatitis C Screening  Completed    Health Maintenance  There are no preventive care reminders to display for this patient.  Colorectal cancer screening: decline  Lung Cancer Screening: (Low Dose CT Chest recommended if Age 59-80 years, 30 pack-year currently smoking OR have quit w/in 15years.) does qualify.   Lung Cancer Screening Referral: CT scan 01/16/2020  Additional Screening:  Hepatitis C Screening: does qualify; Completed 04/19/2018  Vision Screening: Recommended annual ophthalmology exams for early detection of glaucoma and other disorders of the eye. Is the patient up to date with their annual eye exam?  No  Who is the provider or what is the name of the office in which the patient attends annual eye exams? none If pt is not established with a provider, would they like to be referred to a provider to establish care? No .   Dental Screening: Recommended annual dental exams for proper oral hygiene  Community Resource Referral / Chronic Care Management: CRR required this visit?  No   CCM required this visit?  No      Plan:     I have personally reviewed and noted the following in the patient's chart:   . Medical and social history . Use of alcohol, tobacco or illicit drugs  . Current medications and supplements . Functional ability and status . Nutritional status . Physical activity . Advanced directives . List of other physicians . Hospitalizations, surgeries, and ER visits in previous 12 months . Vitals . Screenings to include cognitive, depression, and falls . Referrals and appointments  In addition, I have reviewed and discussed with patient certain preventive protocols, quality metrics, and best  practice recommendations. A written personalized care plan for preventive services as well as general preventive health recommendations  were provided to patient.     Kellie Simmering, LPN   [id number]   Nurse Notes:

## 2020-02-01 NOTE — Patient Instructions (Signed)
Mr. Kevin Nelson , Thank you for taking time to come for your Medicare Wellness Visit. I appreciate your ongoing commitment to your health goals. Please review the following plan we discussed and let me know if I can assist you in the future.   Screening recommendations/referrals: Colonoscopy: decline Recommended yearly ophthalmology/optometry visit for glaucoma screening and checkup Recommended yearly dental visit for hygiene and checkup  Vaccinations: Influenza vaccine: decline Pneumococcal vaccine: decline Tdap vaccine: decline Shingles vaccine: decline   Covid-19:  09/13/2019, 08/13/2019  Advanced directives: Please bring a copy of your POA (Power of Attorney) and/or Living Will to your next appointment.   Conditions/risks identified: smoking  Next appointment: Follow up in one year for your annual wellness visit.   Preventive Care 21 Years and Older, Male Preventive care refers to lifestyle choices and visits with your health care provider that can promote health and wellness. What does preventive care include?  A yearly physical exam. This is also called an annual well check.  Dental exams once or twice a year.  Routine eye exams. Ask your health care provider how often you should have your eyes checked.  Personal lifestyle choices, including:  Daily care of your teeth and gums.  Regular physical activity.  Eating a healthy diet.  Avoiding tobacco and drug use.  Limiting alcohol use.  Practicing safe sex.  Taking low doses of aspirin every day.  Taking vitamin and mineral supplements as recommended by your health care provider. What happens during an annual well check? The services and screenings done by your health care provider during your annual well check will depend on your age, overall health, lifestyle risk factors, and family history of disease. Counseling  Your health care provider may ask you questions about your:  Alcohol use.  Tobacco use.  Drug  use.  Emotional well-being.  Home and relationship well-being.  Sexual activity.  Eating habits.  History of falls.  Memory and ability to understand (cognition).  Work and work Statistician. Screening  You may have the following tests or measurements:  Height, weight, and BMI.  Blood pressure.  Lipid and cholesterol levels. These may be checked every 5 years, or more frequently if you are over 5 years old.  Skin check.  Lung cancer screening. You may have this screening every year starting at age 90 if you have a 30-pack-year history of smoking and currently smoke or have quit within the past 15 years.  Fecal occult blood test (FOBT) of the stool. You may have this test every year starting at age 15.  Flexible sigmoidoscopy or colonoscopy. You may have a sigmoidoscopy every 5 years or a colonoscopy every 10 years starting at age 80.  Prostate cancer screening. Recommendations will vary depending on your family history and other risks.  Hepatitis C blood test.  Hepatitis B blood test.  Sexually transmitted disease (STD) testing.  Diabetes screening. This is done by checking your blood sugar (glucose) after you have not eaten for a while (fasting). You may have this done every 1-3 years.  Abdominal aortic aneurysm (AAA) screening. You may need this if you are a current or former smoker.  Osteoporosis. You may be screened starting at age 13 if you are at high risk. Talk with your health care provider about your test results, treatment options, and if necessary, the need for more tests. Vaccines  Your health care provider may recommend certain vaccines, such as:  Influenza vaccine. This is recommended every year.  Tetanus, diphtheria, and acellular  pertussis (Tdap, Td) vaccine. You may need a Td booster every 10 years.  Zoster vaccine. You may need this after age 60.  Pneumococcal 13-valent conjugate (PCV13) vaccine. One dose is recommended after age  58.  Pneumococcal polysaccharide (PPSV23) vaccine. One dose is recommended after age 57. Talk to your health care provider about which screenings and vaccines you need and how often you need them. This information is not intended to replace advice given to you by your health care provider. Make sure you discuss any questions you have with your health care provider. Document Released: 01/31/2015 Document Revised: 09/24/2015 Document Reviewed: 11/05/2014 Elsevier Interactive Patient Education  2017 [place] Prevention in the Home Falls can cause injuries. They can happen to people of all ages. There are many things you can do to make your home safe and to help prevent falls. What can I do on the outside of my home?  Regularly fix the edges of walkways and driveways and fix any cracks.  Remove anything that might make you trip as you walk through a door, such as a raised step or threshold.  Trim any bushes or trees on the path to your home.  Use bright outdoor lighting.  Clear any walking paths of anything that might make someone trip, such as rocks or tools.  Regularly check to see if handrails are loose or broken. Make sure that both sides of any steps have handrails.  Any raised decks and porches should have guardrails on the edges.  Have any leaves, snow, or ice cleared regularly.  Use sand or salt on walking paths during winter.  Clean up any spills in your garage right away. This includes oil or grease spills. What can I do in the bathroom?  Use night lights.  Install grab bars by the toilet and in the tub and shower. Do not use towel bars as grab bars.  Use non-skid mats or decals in the tub or shower.  If you need to sit down in the shower, use a plastic, non-slip stool.  Keep the floor dry. Clean up any water that spills on the floor as soon as it happens.  Remove soap buildup in the tub or shower regularly.  Attach bath mats securely with double-sided  non-slip rug tape.  Do not have throw rugs and other things on the floor that can make you trip. What can I do in the bedroom?  Use night lights.  Make sure that you have a light by your bed that is easy to reach.  Do not use any sheets or blankets that are too big for your bed. They should not hang down onto the floor.  Have a firm chair that has side arms. You can use this for support while you get dressed.  Do not have throw rugs and other things on the floor that can make you trip. What can I do in the kitchen?  Clean up any spills right away.  Avoid walking on wet floors.  Keep items that you use a lot in easy-to-reach places.  If you need to reach something above you, use a strong step stool that has a grab bar.  Keep electrical cords out of the way.  Do not use floor polish or wax that makes floors slippery. If you must use wax, use non-skid floor wax.  Do not have throw rugs and other things on the floor that can make you trip. What can I do with my stairs?  Do not leave any items on the stairs.  Make sure that there are handrails on both sides of the stairs and use them. Fix handrails that are broken or loose. Make sure that handrails are as long as the stairways.  Check any carpeting to make sure that it is firmly attached to the stairs. Fix any carpet that is loose or worn.  Avoid having throw rugs at the top or bottom of the stairs. If you do have throw rugs, attach them to the floor with carpet tape.  Make sure that you have a light switch at the top of the stairs and the bottom of the stairs. If you do not have them, ask someone to add them for you. What else can I do to help prevent falls?  Wear shoes that:  Do not have high heels.  Have rubber bottoms.  Are comfortable and fit you well.  Are closed at the toe. Do not wear sandals.  If you use a stepladder:  Make sure that it is fully opened. Do not climb a closed stepladder.  Make sure that both  sides of the stepladder are locked into place.  Ask someone to hold it for you, if possible.  Clearly mark and make sure that you can see:  Any grab bars or handrails.  First and last steps.  Where the edge of each step is.  Use tools that help you move around (mobility aids) if they are needed. These include:  Canes.  Walkers.  Scooters.  Crutches.  Turn on the lights when you go into a dark area. Replace any light bulbs as soon as they burn out.  Set up your furniture so you have a clear path. Avoid moving your furniture around.  If any of your floors are uneven, fix them.  If there are any pets around you, be aware of where they are.  Review your medicines with your doctor. Some medicines can make you feel dizzy. This can increase your chance of falling. Ask your doctor what other things that you can do to help prevent falls. This information is not intended to replace advice given to you by your health care provider. Make sure you discuss any questions you have with your health care provider. Document Released: 10/31/2008 Document Revised: 06/12/2015 Document Reviewed: 02/08/2014 Elsevier Interactive Patient Education  2017 [employer].

## 2020-02-05 ENCOUNTER — Telehealth: Payer: Self-pay | Admitting: *Deleted

## 2020-02-05 ENCOUNTER — Encounter
Admission: RE | Admit: 2020-02-05 | Discharge: 2020-02-05 | Disposition: A | Discharge status: Home / Self Care | Payer: Medicare Other | Source: Ambulatory Visit | Attending: Otolaryngology | Admitting: Otolaryngology

## 2020-02-05 ENCOUNTER — Other Ambulatory Visit: Payer: Self-pay

## 2020-02-05 DIAGNOSIS — Z8521 Personal history of malignant neoplasm of larynx: Secondary | ICD-10-CM | POA: Insufficient documentation

## 2020-02-05 DIAGNOSIS — C329 Malignant neoplasm of larynx, unspecified: Secondary | ICD-10-CM | POA: Diagnosis not present

## 2020-02-05 LAB — GLUCOSE, CAPILLARY: Glucose-Capillary: 91 mg/dL (ref 70–99)

## 2020-02-05 MED ORDER — FLUDEOXYGLUCOSE F - 18 (FDG) INJECTION
5.5200 | Freq: Once | INTRAVENOUS | Status: AC | PRN
  Administered 2020-02-05: 5.52 via INTRAVENOUS

## 2020-02-05 NOTE — Telephone Encounter (Signed)
The patient and his girlfriend had called about the pt is having pet scan and we would like rao to call him before he goes to have bx. I checked with Janese Banks and she will call pt. Tomorrow. I called patti and let her know. She is out of his house right now because she had the flu. He is going to have  The bx next week. And they wanted to see how the scan was and what dr Janese Banks thought

## 2020-02-14 ENCOUNTER — Other Ambulatory Visit: Payer: Self-pay

## 2020-02-14 MED ORDER — OXYCODONE HCL 5 MG PO TABS: 5.0000 mg | ORAL_TABLET | Freq: Three times a day (TID) | ORAL | 0 refills | Status: DC | PRN

## 2020-02-14 MED ORDER — NALOXEGOL OXALATE 25 MG PO TABS: 25.0000 mg | ORAL_TABLET | Freq: Every day | ORAL | 0 refills | Status: DC

## 2020-02-18 ENCOUNTER — Telehealth: Payer: Self-pay | Admitting: Nurse Practitioner

## 2020-02-18 ENCOUNTER — Other Ambulatory Visit: Payer: Self-pay | Admitting: Nurse Practitioner

## 2020-02-18 MED ORDER — CLONAZEPAM 1 MG PO TABS: ORAL_TABLET | ORAL | 2 refills | Status: DC

## 2020-02-18 NOTE — Telephone Encounter (Signed)
Have allotted for fill on 02/19/20.  Recommend he use as instructed as this should last full 30 days.

## 2020-02-18 NOTE — Telephone Encounter (Signed)
Pt called stating that his prescription for clonazepam is supposed to be refilled on 02/23/20. Pt states that his pharmacy is closed on the weekend and that he due to his medication changes and difficult times he is completely out of this medication. Pt is requesting to have this sent in early. Please advise.      [facility] [place], [place] AT [address] [facility]  [address] [place] [address]  Phone: [phone] Fax: [phone]  Hours: Not open 24 hours

## 2020-02-20 NOTE — Telephone Encounter (Signed)
Patient called and informed, Patient verbalized understanding

## 2020-02-21 DIAGNOSIS — Z85828 Personal history of other malignant neoplasm of skin: Secondary | ICD-10-CM | POA: Diagnosis not present

## 2020-02-21 DIAGNOSIS — D492 Neoplasm of unspecified behavior of bone, soft tissue, and skin: Secondary | ICD-10-CM | POA: Diagnosis not present

## 2020-02-21 DIAGNOSIS — L578 Other skin changes due to chronic exposure to nonionizing radiation: Secondary | ICD-10-CM | POA: Diagnosis not present

## 2020-02-21 DIAGNOSIS — L814 Other melanin hyperpigmentation: Secondary | ICD-10-CM | POA: Diagnosis not present

## 2020-02-21 DIAGNOSIS — C44519 Basal cell carcinoma of skin of other part of trunk: Secondary | ICD-10-CM | POA: Diagnosis not present

## 2020-02-21 DIAGNOSIS — D229 Melanocytic nevi, unspecified: Secondary | ICD-10-CM | POA: Diagnosis not present

## 2020-02-22 ENCOUNTER — Telehealth: Payer: Self-pay | Admitting: General Practice

## 2020-02-22 ENCOUNTER — Telehealth: Payer: Medicare Other

## 2020-02-22 NOTE — Telephone Encounter (Signed)
  Chronic Care Management   Outreach Note  02/22/2020 Name: Kevin Nelson MRN: [id number] DOB: 24-Jun-1950  Referred by: [name], NP Reason for referral : Appointment (RNCM: 2nd attempt for Chronic Disease Management and Care Coordination Needs )   A second unsuccessful telephone outreach was attempted today. The patient was referred to the case management team for assistance with care management and care coordination. The patient answered but was eating and ask for a call back at another time. Rescheduled.   Follow Up Plan: The care management team will reach out to the patient again over the next 30 to 60 days.   Noreene Larsson RN, MSN, [facility] Mobile: [phone]

## 2020-02-27 ENCOUNTER — Other Ambulatory Visit: Payer: Self-pay | Admitting: Nurse Practitioner

## 2020-02-27 NOTE — Telephone Encounter (Signed)
   Notes to clinic:  Medication last filled by a historical provider  Review for refill    Requested Prescriptions  Pending Prescriptions Disp Refills   ipratropium-albuterol (DUONEB) 0.5-2.5 (3) MG/3ML SOLN [Pharmacy Med Name: IPRATROPI/ALB 0.5/3MG  INH SL 30X3ML] 360 mL     Sig: USE 3 ML VIA NEBULIZER EVERY 6 HOURS AS NEEDED      Pulmonology:  Combination Products Passed - 02/27/2020 12:12 PM      Passed - Valid encounter within last 12 months    Recent Outpatient Visits           1 month ago COPD, very severe ([place])   [place], [name] T, NP   3 months ago COPD, very severe ([place])   [place], [name] T, NP   5 months ago COPD, very severe ([place])   [place], [name] T, NP   8 months ago Squamous cell carcinoma of larynx ([place])   [place], [name], NP   11 months ago Laryngeal cancer ([place])   [place], [name], NP       Future Appointments             In 2 months [place], [name], NP [facility] [facility]   In 58 months  [facility] [facility]

## 2020-02-28 ENCOUNTER — Telehealth: Payer: Self-pay

## 2020-02-28 DIAGNOSIS — C44519 Basal cell carcinoma of skin of other part of trunk: Secondary | ICD-10-CM | POA: Diagnosis not present

## 2020-02-28 DIAGNOSIS — Z85828 Personal history of other malignant neoplasm of skin: Secondary | ICD-10-CM | POA: Diagnosis not present

## 2020-02-28 DIAGNOSIS — L578 Other skin changes due to chronic exposure to nonionizing radiation: Secondary | ICD-10-CM | POA: Diagnosis not present

## 2020-02-28 DIAGNOSIS — C44612 Basal cell carcinoma of skin of right upper limb, including shoulder: Secondary | ICD-10-CM | POA: Diagnosis not present

## 2020-02-28 DIAGNOSIS — C44619 Basal cell carcinoma of skin of left upper limb, including shoulder: Secondary | ICD-10-CM | POA: Diagnosis not present

## 2020-02-28 DIAGNOSIS — C44319 Basal cell carcinoma of skin of other parts of face: Secondary | ICD-10-CM | POA: Diagnosis not present

## 2020-02-28 NOTE — Telephone Encounter (Signed)
PA for nebulizer solution initiated and submitted via Cover My Meds. Key: [id number]

## 2020-02-28 NOTE — Telephone Encounter (Signed)
PA denied due to medication not being given in a nursing facility.

## 2020-03-04 ENCOUNTER — Other Ambulatory Visit: Payer: Self-pay

## 2020-03-04 ENCOUNTER — Other Ambulatory Visit: Payer: Medicare Other | Admitting: Adult Health Nurse Practitioner

## 2020-03-04 DIAGNOSIS — Z515 Encounter for palliative care: Secondary | ICD-10-CM

## 2020-03-04 DIAGNOSIS — J449 Chronic obstructive pulmonary disease, unspecified: Secondary | ICD-10-CM

## 2020-03-04 DIAGNOSIS — C329 Malignant neoplasm of larynx, unspecified: Secondary | ICD-10-CM

## 2020-03-04 DIAGNOSIS — C4491 Basal cell carcinoma of skin, unspecified: Secondary | ICD-10-CM | POA: Diagnosis not present

## 2020-03-04 DIAGNOSIS — E43 Unspecified severe protein-calorie malnutrition: Secondary | ICD-10-CM

## 2020-03-04 NOTE — Progress Notes (Signed)
[facility] Consult Note Telephone: [phone]  Fax: [phone]  PATIENT NAME: Kevin Nelson DOB: 1950-07-26 MRN: [id number]  PRIMARY CARE PROVIDER:   [name], NP  REFERRING PROVIDER:  [name], NP [address] [place],  [place] [address]  RESPONSIBLE PARTY:  [place], [place] [phone]  Chief complaint:  Follow up palliative visit/dyspnea  RECOMMENDATIONS and PLAN: 1.Advanced care planning. Listed as DNR in [place].  Patient and finace voice that he would like to have CPR done if needed. Briefly went over MOST and left blank copy for them to go over.  2.  COPD.  Patient has been changed from his inhalers to duonebs QID with albuterol rescue inhaler and has albuterol neb treatments PRN.  He states that he has gotten the best relief in the past with Stiolto daily and combivent that he would take as needed usually 1-2 times a day.  Some days would not have to use the combivent. He would also use duonebs as needed.  Fiance states that he was his best with the stiolto and combivent. States that he had more energy and his breathing was better.  Explained that with COPD that the lungs are less elastic and can get more medicine with the neb treatments.  States that this is limiting as he does like to go out with his friends and likes to ride his motorcycle.  States the albuterol inhaler provides little relief. Inquiring if could go back on stiolto and combivent with the duonebs PRN.  Will reach out to Dr. Patsey Berthold, pulmonology, with his request.    3.  Laryngeal cancer.  Encouraged to call ENT office to get results of PET scan.  Discussed that he is not sure if he wants to do biopsy if only option for treatment is surgery if they do find something.  Discussed that once they get the results of the PET scan and know what options are available then he can make an informed decision.  Continue follow up and  recommendations by ENT and oncology  4.  PCM.  Encouraged to continue with as much supplementation as tolerated and to continue with soft foods.  Continue working with orthodontist to finish dental work  5.  Skin cancer.  Continue follow up and recommendations by dermatology  Palliative will continue to monitor for symptom management/decline and make recommendations as needed.  Next appointment in 6 weeks.  Encouraged to call with any questions or concerns.   I spent 60 minutes providing this consultation, including time with patient/family, provider coordination, chart review including latest labs and imaging, documentation. More than 50% of the time in this consultation was spent coordinating communication.   HISTORY OF PRESENT ILLNESS:  Kevin Nelson is a 70 y.o. year old male with multiple medical problems including severe stage 3 COPD,larynx cancer, prostate cancer, skin cancer, CAD, chronic hepatitis. Palliative Care was asked to help address goals of care. Patient with recent changes to his COPD meds, see above.  Patient having difficulty eating with recent ongoing dental work.  Food gets under his bottom partial and causes pain while chewing.  Has tried supplemental drinks that cause his saliva to thicken.  He does not tolerate dairy products well.  Has tried soft, smoother foods. He has lost weight from 105 pounds in December 2021 to 100 pounds in January this year. States he has an appetite. He has multiple areas of skin cancer that he is having removed with  dermatology. States that had ENT appointment last December and physician stated seeing something in his throat and wanted to schedule a biopsy. Patient did have PET scan in January and are waiting for the results. Patient has not had any falls, infection, or hospital visits since last visit.  Rest of 10 point ROS asked and negative.  CODE STATUS: see above  PPS: 60% HOSPICE ELIGIBILITY/DIAGNOSIS: TBD  PHYSICAL EXAM:  HR 99  O2 99% on  3.5L General: NAD, frail appearing, thin Eyes: sclera anicteric and noninjected with no discharge noted Cardiovascular: regular rate and rhythm Pulmonary:lung sounds diminished more on left than right; no abnormal breath sounds heard; normal respiratory effort Abdomen: soft, nontender, hypoactive bowel sounds Extremities: no edema, no joint deformities Skin: no rashes on exposed skin Neurological: Weakness but otherwise nonfocal   PAST MEDICAL HISTORY:  Past Medical History:  Diagnosis Date  . Anxiety   . Basal cell carcinoma   . Cigarette smoker   . Dental decay   . Dysphagia   . Dyspnea   . End stage COPD ([place])    end stage copd, emphysema  . Erectile dysfunction   . Headache    every morning  . Hepatitis C    treated for 8 weeks this past year with Mayvret  . History of nonmelanoma skin cancer   . Hoarseness, chronic   . Myocardial infarction [facility]) march/april 2020   mild heart attack  . Prostate cancer ([place])   . Respiratory failure, acute ([place])    was inpt for 5 days. thought he was going to die.  . Sleep apnea   . Squamous cell skin cancer   . Throat cancer ([place])   . Use of leuprolide acetate (Lupron)    treated for prostate cancer.   . 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 Smoking status: Current Every Day Smoker    Packs/day: 0.50    Years: 52.00    Pack years: 26.00    Types: Cigarettes  . Smokeless tobacco: Never Used  . Tobacco comment: 8 cigarettes a day 01/31/2020  Substance Use Topics  . Alcohol use: Not Currently    Alcohol/week: 3.0 standard drinks    Types: 3 Cans of beer per week    ALLERGIES: No Known Allergies   PERTINENT MEDICATIONS:  Outpatient Encounter Medications as of 03/04/2020  Medication Sig  . albuterol (PROVENTIL) (2.5 MG/3ML) 0.083% nebulizer solution Take 3 mLs (2.5 mg total) by nebulization every 6 (six) hours as needed for wheezing or shortness of breath.  [name]  albuterol (VENTOLIN HFA) 108 (90 Base) MCG/ACT inhaler Inhale 2 puffs into the lungs every 6 (six) hours as needed for wheezing or shortness of breath.  . clonazePAM (KLONOPIN) 1 MG tablet TAKE 1 TABLET(1 MG) BY MOUTH TWICE DAILY AS NEEDED FOR ANXIETY  . fluticasone (FLONASE) 50 MCG/ACT nasal spray SHAKE LIQUID AND USE 1 SPRAY IN EACH NOSTRIL TWICE DAILY  . ipratropium-albuterol (DUONEB) 0.5-2.5 (3) MG/3ML SOLN USE 3 ML VIA NEBULIZER EVERY 6 HOURS AS NEEDED  . lidocaine (XYLOCAINE) 2 % solution 15 mL.  . lidocaine-prilocaine (EMLA) cream Apply topically once.  . naloxegol oxalate (MOVANTIK) 25 MG TABS tablet Take 1 tablet (25 mg total) by mouth daily.  [name] oxyCODONE (OXY IR/ROXICODONE) 5 MG immediate release tablet Take 1 tablet (5 mg total) by mouth every 8 (eight) hours as needed for severe pain.  . OXYGEN  Inhale 3 L into the lungs daily.  . predniSONE (DELTASONE) 20 MG tablet Take 2 tablets daily for 4 days then 1 tablet daily for 7 days then half tablet daily or as directed by MD  . triamcinolone (KENALOG) 0.1 % Apply topically.   No facility-administered encounter medications on file as of 03/04/2020.     Quinita Kostelecky Jenetta Downer, NP

## 2020-03-05 ENCOUNTER — Telehealth: Payer: Self-pay | Admitting: Pharmacist

## 2020-03-05 NOTE — Progress Notes (Addendum)
Chronic Care Management Pharmacy Assistant   Name: Kevin Nelson  MRN: [id number] DOB: Oct 21, 1950  Reason for Encounter: Disease State/ COPD  Patient Questions:  1.  Have you seen any other providers since your last visit? Yes, .  2.  Any changes in your medicines or health? No    PCP : [name], NP  Allergies:  No Known Allergies  Medications: Outpatient Encounter Medications as of 03/05/2020  Medication Sig   albuterol (PROVENTIL) (2.5 MG/3ML) 0.083% nebulizer solution Take 3 mLs (2.5 mg total) by nebulization every 6 (six) hours as needed for wheezing or shortness of breath.   clonazePAM (KLONOPIN) 1 MG tablet TAKE 1 TABLET(1 MG) BY MOUTH TWICE DAILY AS NEEDED FOR ANXIETY   fluticasone (FLONASE) 50 MCG/ACT nasal spray SHAKE LIQUID AND USE 1 SPRAY IN EACH NOSTRIL TWICE DAILY   ipratropium-albuterol (DUONEB) 0.5-2.5 (3) MG/3ML SOLN USE 3 ML VIA NEBULIZER EVERY 6 HOURS AS NEEDED   lidocaine (XYLOCAINE) 2 % solution 15 mL.   lidocaine-prilocaine (EMLA) cream Apply topically once.   naloxegol oxalate (MOVANTIK) 25 MG TABS tablet Take 1 tablet (25 mg total) by mouth daily.   oxyCODONE (OXY IR/ROXICODONE) 5 MG immediate release tablet Take 1 tablet (5 mg total) by mouth every 8 (eight) hours as needed for severe pain.   OXYGEN Inhale 3 L into the lungs daily.   predniSONE (DELTASONE) 20 MG tablet Take 2 tablets daily for 4 days then 1 tablet daily for 7 days then half tablet daily or as directed by MD   triamcinolone (KENALOG) 0.1 % Apply topically.   [DISCONTINUED] albuterol (VENTOLIN HFA) 108 (90 Base) MCG/ACT inhaler Inhale 2 puffs into the lungs every 6 (six) hours as needed for wheezing or shortness of breath.   No facility-administered encounter medications on file as of 03/05/2020.    Current Diagnosis: Patient Active Problem List   Diagnosis Date Noted   B12 deficiency 11/29/2019   Goals of care, counseling/discussion 01/25/2019   Squamous cell carcinoma of larynx  (HCC) 01/04/2019   Chronic nonintractable headache 11/10/2018   Sensorineural hearing loss (SNHL) 08/21/2018   Aortic atherosclerosis ([place]) 07/31/2018   Chronic hepatitis ([place]) 04/21/2018   Lung nodule < 6cm on CT 04/19/2018   Senile purpura ([place]) 04/19/2018   Protein-calorie malnutrition ([place]) 04/19/2018   Nicotine dependence, cigarettes, uncomplicated 38/46/6599   Controlled substance agreement signed 01/27/2018   Chronic hypoxemic respiratory failure ([place]) 01/26/2018   COPD, very severe ([place]) 01/13/2018   Prostate cancer ([place]) 01/13/2018   History of basal cell carcinoma (B[facility]) 01/13/2018   Generalized anxiety disorder 01/13/2018    Current COPD regimen: Proventil Rescue Inhaler/ Duoneb/ Oxygen / Prednisone No flowsheet data found. Any recent hospitalizations or ED visits since last visit with CPP? No Denies COPD symptoms, including Increased shortness of breath  and Wheezing What recent interventions/DTPs have been made by any provider to improve breathing since last visit: Have you had exacerbation/flare-up since last visit? No What do you do when you are short of breath?  Adhere to COPD Action Plan, Rescue medication, Anxiety medication and Rest.  Respiratory Devices/Equipment Do you have a nebulizer? Yes Do you use a Peak Flow Meter? No Do you use a maintenance inhaler? No Do you use a rescue inhaler? Yes How often do you use your rescue inhaler?  daily Do you use a spacer with your inhaler? No  03-05-2020: Patient was pleasant and cooperative during phone conversation. Stated that he has had no issues related to  his COPD. Continues to use his 02 at 3 liters. Reported he is still going to his dermatologist to have skin cancer removed. Also reported he is working with dentist on dentures since all of his bottom teeth have been removed. Patient reported no acute needs at this time.   Cloretta Ned, LPN Clinical Pharmacist Assistant  [phone]   Follow-Up:  Pharmacist  Review  I have reviewed the care management and care coordination activities outlined in this encounter and I am certifying that I agree with the content of this note.  Kevin Nelson PharmD, [place] [facility] [phone]

## 2020-03-06 DIAGNOSIS — L720 Epidermal cyst: Secondary | ICD-10-CM | POA: Diagnosis not present

## 2020-03-06 DIAGNOSIS — D235 Other benign neoplasm of skin of trunk: Secondary | ICD-10-CM | POA: Diagnosis not present

## 2020-03-06 DIAGNOSIS — C4491 Basal cell carcinoma of skin, unspecified: Secondary | ICD-10-CM | POA: Diagnosis not present

## 2020-03-07 ENCOUNTER — Telehealth: Payer: Self-pay

## 2020-03-10 ENCOUNTER — Other Ambulatory Visit: Payer: Self-pay | Admitting: Pulmonary Disease

## 2020-03-11 DIAGNOSIS — L814 Other melanin hyperpigmentation: Secondary | ICD-10-CM | POA: Diagnosis not present

## 2020-03-11 DIAGNOSIS — L578 Other skin changes due to chronic exposure to nonionizing radiation: Secondary | ICD-10-CM | POA: Diagnosis not present

## 2020-03-11 DIAGNOSIS — L988 Other specified disorders of the skin and subcutaneous tissue: Secondary | ICD-10-CM | POA: Diagnosis not present

## 2020-03-11 DIAGNOSIS — C441122 Basal cell carcinoma of skin of right lower eyelid, including canthus: Secondary | ICD-10-CM | POA: Diagnosis not present

## 2020-03-11 DIAGNOSIS — C44619 Basal cell carcinoma of skin of left upper limb, including shoulder: Secondary | ICD-10-CM | POA: Diagnosis not present

## 2020-03-12 ENCOUNTER — Other Ambulatory Visit: Payer: Self-pay | Admitting: Nurse Practitioner

## 2020-03-17 ENCOUNTER — Telehealth: Payer: Self-pay | Admitting: *Deleted

## 2020-03-17 ENCOUNTER — Other Ambulatory Visit: Payer: Self-pay | Admitting: *Deleted

## 2020-03-17 MED ORDER — OXYCODONE HCL 5 MG PO TABS: 5.0000 mg | ORAL_TABLET | Freq: Three times a day (TID) | ORAL | 0 refills | Status: DC | PRN

## 2020-03-17 MED ORDER — NALOXEGOL OXALATE 25 MG PO TABS: 25.0000 mg | ORAL_TABLET | Freq: Every day | ORAL | 0 refills | Status: DC

## 2020-03-17 NOTE — Telephone Encounter (Signed)
Called Patti back and got her voicemail and left message that dr Janese Banks has refilled it. She wanted pt to know that this will be the last pain med refill. Pt is out of treatment several months and he will need to seek PCP if he needs pain meds in future. Patti and pt. Are welcome to call if they have questions.

## 2020-03-28 ENCOUNTER — Telehealth: Payer: Medicare Other | Admitting: General Practice

## 2020-03-28 ENCOUNTER — Ambulatory Visit (INDEPENDENT_AMBULATORY_CARE_PROVIDER_SITE_OTHER): Payer: Medicare Other | Admitting: General Practice

## 2020-03-28 DIAGNOSIS — J449 Chronic obstructive pulmonary disease, unspecified: Secondary | ICD-10-CM | POA: Diagnosis not present

## 2020-03-28 DIAGNOSIS — R52 Pain, unspecified: Secondary | ICD-10-CM

## 2020-03-28 DIAGNOSIS — F411 Generalized anxiety disorder: Secondary | ICD-10-CM

## 2020-03-28 DIAGNOSIS — K089 Disorder of teeth and supporting structures, unspecified: Secondary | ICD-10-CM

## 2020-03-28 NOTE — Chronic Care Management (AMB) (Signed)
Chronic Care Management   CCM RN Visit Note  03/28/2020 Name: Kevin Nelson MRN: [id number] DOB: 01/09/1951  Subjective: Kevin Nelson is a 70 y.o. year old male who is a primary care patient of Cannady, [name], NP. The care management team was consulted for assistance with disease management and care coordination needs.    Engaged with patient by telephone for follow up visit in response to provider referral for case management and/or care coordination services.   Consent to Services:  The patient was given information about Chronic Care Management services, agreed to services, and gave verbal consent prior to initiation of services.  Please see initial visit note for detailed documentation.   Patient agreed to services and verbal consent obtained.   Assessment: Review of patient past medical history, allergies, medications, health status, including review of consultants reports, laboratory and other test data, was performed as part of comprehensive evaluation and provision of chronic care management services.   SDOH (Social Determinants of Health) assessments and interventions performed:    CCM Care Plan  No Known Allergies  Outpatient Encounter Medications as of 03/28/2020  Medication Sig  . albuterol (PROVENTIL) (2.5 MG/3ML) 0.083% nebulizer solution Take 3 mLs (2.5 mg total) by nebulization every 6 (six) hours as needed for wheezing or shortness of breath.  [name] albuterol (VENTOLIN HFA) 108 (90 Base) MCG/ACT inhaler INHALE 2 PUFFS INTO THE LUNGS EVERY 6 HOURS AS NEEDED FOR WHEEZING OR SHORTNESS OF BREATH  . albuterol (VENTOLIN HFA) 108 (90 Base) MCG/ACT inhaler INHALE 2 PUFFS INTO THE LUNGS EVERY 6 HOURS AS NEEDED FOR WHEEZING OR SHORTNESS OF BREATH  . clonazePAM (KLONOPIN) 1 MG tablet TAKE 1 TABLET(1 MG) BY MOUTH TWICE DAILY AS NEEDED FOR ANXIETY  . fluticasone (FLONASE) 50 MCG/ACT nasal spray SHAKE LIQUID AND USE 1 SPRAY IN EACH NOSTRIL TWICE DAILY  . ipratropium-albuterol (DUONEB)  0.5-2.5 (3) MG/3ML SOLN USE 3 ML VIA NEBULIZER EVERY 6 HOURS AS NEEDED  . lidocaine (XYLOCAINE) 2 % solution 15 mL.  . lidocaine-prilocaine (EMLA) cream Apply topically once.  . naloxegol oxalate (MOVANTIK) 25 MG TABS tablet Take 1 tablet (25 mg total) by mouth daily.  [name] oxyCODONE (OXY IR/ROXICODONE) 5 MG immediate release tablet Take 1 tablet (5 mg total) by mouth every 8 (eight) hours as needed for severe pain.  . OXYGEN Inhale 3 L into the lungs daily.  . predniSONE (DELTASONE) 20 MG tablet Take 2 tablets daily for 4 days then 1 tablet daily for 7 days then half tablet daily or as directed by MD  . triamcinolone (KENALOG) 0.1 % Apply topically.   No facility-administered encounter medications on file as of 03/28/2020.    Patient Active Problem List   Diagnosis Date Noted  . B12 deficiency 11/29/2019  . Goals of care, counseling/discussion 01/25/2019  . Squamous cell carcinoma of larynx ([place]) 01/04/2019  . Chronic nonintractable headache 11/10/2018  . Sensorineural hearing loss ([facility]) 08/21/2018  . Aortic atherosclerosis ([place]) 07/31/2018  . Chronic hepatitis ([place]) 04/21/2018  . Lung nodule < 6cm on CT 04/19/2018  . Senile purpura ([place]) 04/19/2018  . Protein-calorie malnutrition ([place]) 04/19/2018  . Nicotine dependence, cigarettes, uncomplicated 32/99/2426  . Controlled substance agreement signed 01/27/2018  . Chronic hypoxemic respiratory failure ([place]) 01/26/2018  . COPD, very severe ([place]) 01/13/2018  . Prostate cancer ([place]) 01/13/2018  . History of basal cell carcinoma ([facility]) 01/13/2018  . Generalized anxiety disorder 01/13/2018    Conditions to be addressed/monitored:COPD, Anxiety and Chronic pain  Care Plan : RNCM:  COPD (Adult)  Updates made by Vanita Ingles since 03/28/2020 12:00 AM    Problem: RNCM: Psychological Adjustment to Diagnosis (COPD)   Priority: Medium    Long-Range Goal: RNCM: Adjustment to Disease Achieved   Priority: Medium  Note:   Current Barriers:   [name] Knowledge deficits related to basic understanding of COPD disease process . Knowledge deficits related to basic COPD self care/management . Knowledge deficit related to basic understanding of how to use inhalers and how inhaled medications work . Knowledge deficit related to importance of energy conservation . Limited Social Support . Lacks social connections . Does not maintain contact with provider office . Does not contact provider office for questions/concerns  Case Manager Clinical Goal(s):  patient will report using inhalers as prescribed including rinsing mouth after use  patient will report utilizing pursed lip breathing for shortness of breath  patient will verbalize understanding of COPD action plan and when to seek appropriate levels of medical care  patient will engage in lite exercise as tolerated to build/regain stamina and strength and reduce shortness of breath through activity tolerance  patient will verbalize basic understanding of COPD disease process and self care activities  patient will not be hospitalized for COPD exacerbation as evidenced  Interventions:  . Collaboration with [name], NP regarding development and update of comprehensive plan of care as evidenced by provider attestation and co-signature . Inter-disciplinary care team collaboration (see longitudinal plan of care)  Provided patient with basic written and verbal COPD education on self care/management/and exacerbation prevention   Provided patient with COPD action plan and reinforced importance of daily self assessment  Discussed Pulmonary Rehab and offered to assist with referral placement  Provided written and verbal instructions on pursed lip breathing and utilized returned demonstration as teach back  Provided instruction about proper use of medications used for management of COPD including inhalers  Advised patient to self assesses COPD action plan zone and make appointment  with provider if in the yellow zone for 48 hours without improvement.  Provided patient with education about the role of exercise in the management of COPD  Advised patient to engage in light exercise as tolerated 3-5 days a week  Provided education about and advised patient to utilize infection prevention strategies to reduce risk of respiratory infection  Self-Care Activities:  Self administers medications as prescribed Attends all scheduled provider appointments Calls pharmacy for medication refills Attends church or other social activities Performs ADL's independently Performs IADL's independently Calls provider office for new concerns or questions Patient Goals: - do breathing exercises every day - do breathing exercises at least 2 times each day - do exercises in a comfortable position that makes breathing as easy as possible - develop a new routine to improve sleep - don't eat or exercise right before bedtime - eat healthy - get at least 7 to 8 hours of sleep at night - get outdoors every day (weather permitting) - keep room cool and dark - limit daytime naps - practice relaxation or meditation daily - use a fan or white noise in bedroom - develop a rescue plan - eliminate symptom triggers at home - follow rescue plan if symptoms flare-up - keep follow-up appointments - use an extra pillow to sleep - identify and avoid work-related triggers - identify and remove indoor air pollutants - limit outdoor activity during cold weather - listen for public air quality announcements every day - counseling provided - decision-making supported - depression screen reviewed - emotional  support provided - family involvement promoted - problem-solving facilitated - relaxation techniques promoted - verbalization of feelings encouraged Follow Up Plan: Telephone follow up appointment with care management team member scheduled for: 06-10-2020 at 3:45 pm    Task: RNCM: Support  Psychosocial Response to Chronic Obstructive Pulmonary Disease   Note:   Care Management Activities:    - counseling provided - decision-making supported - depression screen reviewed - emotional support provided - family involvement promoted - problem-solving facilitated - relaxation techniques promoted - verbalization of feelings encouraged       Care Plan : RNCM: Chronic Pain (Adult)  Updates made by Vanita Ingles since 03/28/2020 12:00 AM    Problem: RNCM: Pain Management Plan (Chronic Pain)   Priority: High    Long-Range Goal: RNCM: Pain Management Plan Developed   Priority: High  Note:   Current Barriers:  [name] Knowledge Deficits related to managing acute/chronic pain . Non-adherence to scheduled provider appointments . Non-adherence to prescribed medication regimen . Difficulty obtaining medications . Chronic Disease Management support and education needs related to chronic pain . Lacks social connections . Does not maintain contact with provider office . Does not contact provider office for questions/concerns Nurse Case Manager Clinical Goal(s):  . patient will verbalize understanding of plan for managing pain . patient will attend all scheduled medical appointments: 04-28-2020 . patient will demonstrate use of different relaxation  skills and/or diversional activities to assist with pain reduction (distraction, imagery, relaxation, massage, acupressure, TENS, heat, and cold application . patient will report pain at a level less than 3 to 4 on a 10-10 rating scale . patient will use pharmacological and nonpharmacological pain relief strategies . patient will verbalize acceptable level of pain relief and ability to engage in desired activities . patient will engage in desired activities without an increase in pain level Interventions:  . Collaboration with [name], NP regarding development and update of comprehensive plan of care as evidenced by provider  attestation and co-signature . Inter-disciplinary care team collaboration (see longitudinal plan of care) . - deep breathing, relaxation and mindfulness use promoted . - effectiveness of pharmacologic therapy monitored . - misuse of pain medication assessed . - motivation and barriers to change assessed and addressed . - mutually acceptable comfort goal set . - pain assessed . - pain treatment goals reviewed . Evaluation of current treatment plan related to pain and patient's adherence to plan as established by provider. The patient states he is having a lot of pain in his mouth due to ill fitting dentures. He can not eat and his mouth is sore. He says they have really messed his mouth up and he is tired of being in pain. Empathetic listening and support given.  . Advised patient to call the office for worsening pain or intensity of pain, also advised the patient to contact the specialist working with his dental work to see what alternatives he has due to ill fitting dentures.  . Provided education to patient re: alternate pain medications and options . Reviewed medications with patient and discussed states that the only medication that helps his pain is the oxycodone and he does not have anymore.  [name] Collaborated with pcp regarding the patients request for an RX for oxycodone. Explained to the patient that this is not something the pcp typically does. The patient became upset and could not understand why anyone but the cancer provider would give him a script for oxycodone. When  attempting to explain the process  the patient cut the call short and said he would talk to the [facility] again but they had come to pick his truck up to be repaired.  . Discussed plans with patient for ongoing care management follow up and provided patient with direct contact information for care management team . Allow patient to maintain a diary of pain ratings, timing, precipitating events, medications, treatments, and what  works best to relieve pain,  . Refer to support groups and self-help groups . Educate patient about the use of pharmacological interventions for pain management- antianxiety, antidepressants, NSAIDS, opioid analgesics,  . Explain the importance of lifestyle modifications to effective pain management  Patient Goals/Self Care Activities:  . - mutually acceptable comfort goal set . - pain assessed . - pain management plan developed . - pain treatment goals reviewed . - patient response to treatment assessed . - sharing of pain management plan with teachers and other caregivers encouraged . Self-administers medications as prescribed . Attends all scheduled provider appointments . Calls pharmacy for medication refills . Calls provider office for new concerns or questions Follow Up Plan: Telephone follow up appointment with care management team member scheduled for:06-10-2020 at 3:45 pm     Task: RNCM: Partner to Develop Chronic Pain Management Plan   Note:   Care Management Activities:    - mutually acceptable comfort goal set - pain assessed - pain management plan developed - pain treatment goals reviewed - patient response to treatment assessed - sharing of pain management plan with teachers and other caregivers encouraged       Care Plan : RNCM: Anxiety  Updates made by Vanita Ingles since 03/28/2020 12:00 AM    Problem: RNCM: Anxiety Identification (Anxiety)   Priority: Medium    Long-Range Goal: RNCM: Anxiety Symptoms Identified   Priority: Medium  Note:   Current Barriers:  . Chronic Disease Management support and education needs related to effective management of anxiety  . Lacks caregiver support.  . Unable to independently manage anxiety  . Lacks social connections . Does not maintain contact with provider office . Does not contact provider office for questions/concerns  Nurse Case Manager Clinical Goal(s):  . patient will verbalize understanding of plan for  effective management of anxiety  . patient will work with [facility], [place] team, and pcp  to address needs related to management of Anxiety  . patient will demonstrate a decrease in anxiety  exacerbations as evidenced by no exacerbations of anxiety, stabilized mood, and eliminating stressors in his life that cause anxiety  . patient will attend all scheduled medical appointments: 04-28-2020 at 11 am  Interventions:  . 1:1 collaboration with [name], NP regarding development and update of comprehensive plan of care as evidenced by provider attestation and co-signature . Inter-disciplinary care team collaboration (see longitudinal plan of care) . Evaluation of current treatment plan related to anxiety  and patient's adherence to plan as established by provider. . Advised patient to call the office for changes in anxiety/mood/ or increased depression  . Provided education to patient re: alternative ways to deal with anxiety  . Collaborated with pcp and LCSW regarding periods of anxiety  . Reviewed scheduled/upcoming provider appointments including: 04-28-2020 at 11 am . Discussed plans with patient for ongoing care management follow up and provided patient with direct contact information for care management team  Patient Goals/Self-Care Activities Over the next 120 days, patient will:  - Patient will self administer medications as prescribed Patient will attend all scheduled provider  appointments Patient will call pharmacy for medication refills Patient will attend church or other social activities Patient will continue to perform ADL's independently Patient will continue to perform IADL's independently Patient will call provider office for new concerns or questions Patient will work with BSW to address care coordination needs and will continue to work with the clinical team to address health care and disease management related needs.   - anxiety screen reviewed - depression screen  reviewed - somatic and anxiety symptoms correlated  Follow Up Plan: Telephone follow up appointment with care management team member scheduled for: 06-10-2020 at 3:45 pm       Task: RNCM: Identify Anxiety Symptoms and Facilitate Treatment   Note:   Care Management Activities:    - anxiety screen reviewed - depression screen reviewed - somatic and anxiety symptoms correlated         Plan:Telephone follow up appointment with care management team member scheduled for:  06-10-2020 at 3:45 pm  Noreene Larsson RN, MSN, [facility] Mobile: [phone]

## 2020-03-28 NOTE — Patient Instructions (Signed)
Visit Information  PATIENT GOALS: Goals Addressed              This Visit's Progress   .  COMPLETED: RN-COPD Management/Laryngeal Cancer diagnosis (pt-stated)        Current Barriers: Closing this goal and opening in new ELS . Knowledge deficits related to basic COPD self care/management . Knowledge deficits related to Laryngeal Cancer and self/care management  . Financial barriers . Limited Social Support   Case Manager Clinical Goal(s):  Over the next 90 days, patient will be able to verbalize understanding of COPD action plan and when to seek appropriate levels of medical care  Over the next 90 days, patient will engage in lite exercise as tolerated to build/regain stamina and strength and reduce shortness of breath through activity tolerance  Over the next 90 days, patient will verbalize basic understanding of COPD disease process and self care activities  Over the next 90 days the patient witl verbalized basic understanding of Laryngeal Cancer disease process and self care activities    Interventions:  . Reviewed COPD action plan . Patient reports having all COPD medications . Reviewed upcoming appointment with Pulmonology.  The patient is doing some "soul searching" and talking to his fiance about important decisions that need to be made concerning his health and well being/care.  09-05-2019: The patient states he is doing well and happy that he is over his treatments for his cancer. He is moving forward with going to the dentist and getting his teeth fixed. Recently he had his g tube removed and he says the area is sore but he denies any acute distress. He is eating well.   . Patient reporting he is taking his medications as directed . Continues to smoke, with no interest in quitting- 09-05-2019: Is smoking about 5 cigarettes a day. Will continue to work on cutting back . Continues to be on 02 . Evaluation of current status of Laryngeal cancer and treatment options.  09-05-2019:  The patient has completed his chemotherapy. He feels he is doing well.  . The patient has completed his chemotherapy regimen and is now taking radiation. The patient is feeling much better. 09-05-2019: The patient has an upcoming appointment in [facility] to have teeth pulled. 09-19-2019.  The patient has top dentures but wants to have bottom teeth removed so he can get bottom dentures and be able to eat a regular diet again.  . Evaluation of anxiety.  The patient states he is feeling better each day. He wants to keep doing things he is enjoying and stay safe. He has had some "cold-like" symptoms since getting the first COVID vaccine but denies any acute distress.  . Evaluation of smoking. The patient verbalized he is down to 5 cigarettes a day.  The patient admits that is not enough but it is better than what it was. Praised the patient for small accomplishments. The patient was happier on the call today and thanked the [facility] for ongoing support and help.  . Reports that he has had his first COVID vaccine and gets his second one on 09-13-2019.  [name] Evaluation of upcoming appointments. Next appointment with pcp on 09-18-2019 at [place] with pcp.    Patient Self Care Activities:  Takes medications as prescribed including inhalers  Utilizes infection prevention strategies to reduce risk of respiratory infection   Does not currently perform self care activities related to COPD management  Please see past updates related to this goal by clicking on the "Past  Updates" button in the selected goal        Patient Care Plan: General Social Work (Adult)    Problem Identified: Anxiety Identification (Anxiety)     Long-Range Goal: Anxiety Symptoms Identified   Start Date: 01/04/2020  Priority: Medium  Note:   Evidence-based guidance:   Assess for presence of additional co-occurring psychiatric comorbidity [e.g., substance use, other anxiety disorder (specific phobia, social anxiety disorder, panic disorder,  agoraphobia, substance or medically-induced    anxiety disorder)].   Assess for presence of medical comorbidity (e.g., chronic pain, chronic illness), recent or recurrent trauma or abuse, family history of substance use disorder or mental illness.   Screen for anxiety using standardized, validated tool.   Move gradually from investigating somatic complaints to exploring social or psychologic distress.   Assess for signs and symptoms of anxiety in an atmosphere of hope and optimism.   Facilitate full diagnostic interview when positive screening results are noted; utilize DSM-5 criteria to determine appropriate diagnosis.   Screen for depression simultaneously due to the frequency of co-occurrence.   Notes:    Current Barriers:  [name] Knowledge Deficits related to community resources that are available to him and within his area . Financial Constraints - managing health care expenses  . Health related barriers-cancerous larynx  Clinical Social Worker Clinical Goal(s):  [name] Over the next 90 days, patient will verbalize understanding of plan for gaining crisis support resource education and implementing appropriate self-care tools into his daily routine to improve overall self-care . Over the next 90 days, patient will work with [facility] LCSW to address needs related to increasing self-care and self-care education to cope with financial barriers and stressors.    Interventions: . Patient interviewed and appropriate assessments performed . Provided mental health counseling with regard to coping with daily stress. [name] LCSW provided education on deep breathing and relaxation techniques to implement into his daily routine to combat stressors. Patient admits that his anxiety continues but he has been challenging his negative thinking in order to gain a more positive perspective. He shares that this one coping skill has been effectively working for him. Patient admits that he could benefit from  socialization but due to [place] and his poor physical condition, he has been staying at home and taking appropriate precautions. LCSW provided education on available mental health support resources and socialization opportunities within his community. . Provided patient with information about low income housing resources that are available. However, currently Section 8 and Public Housing wait list is still closed.  [name] LCSW provided reflective listening and implemented appropriate interventions to help suppport patient and his emotional needs  . Discussed plans with patient for ongoing care management follow up and provided patient with direct contact information for care management team . Advised patient to follow up with provided community resources as needed . Assisted patient/caregiver with obtaining information about health plan benefits . Patient confirms stable transportation to his upcoming PCP appointment next month. . Patient reports that his anxiety is "more manageable" but he decided to discontinue taking his celexa. Patient was adamant about not getting back on this medication or trying a new one. LCSW advised patient to contact [facility] if his anxiety or depression increases.  . Patient reports ongoing dryness of the mouth and dehydration. However, he is currently drinking pedialyte which has been helpful. Healthy self-care education provided.  . Evaluation of current status of Laryngeal cancer and treatment options. On 08/29/19, patient states that his cancer was successfully treated  and he no longer has to receive chemotherapy.  . Patient reports that he continues to smoke only 5 cigarettes per day but desires to continue decreasing this intake. Positive reinforcement provided.  [name] LCSW reviewed all upcoming medical appointments with patient. Patient reports having [facility]19[facility]19 where he will remove the last 4 teeth and roots.  Patient has already had 4 removed thus far.  . Patient reports that his dermatologist appointment for skin cancer is not until 12/22/19.  [name] Patient confirms that he and his spouse received the COVID vaccines. . Patient reports that he has successfully went from 88 lbs to 109 lbs but it has been tricky to eat with his dental issues and low appetite. Patient is no longer receiving chemo or radiation.    . Patient states that he bought himself a new motorcycle. He reports that riding his new bike has increased his overall mood and socialization.  . Patient had a recent pulmonologist appointment with Dr. Vernard Gambles and scheduled a follow up appointment with her as well. He shares that he received confirmation that his oxygen levels have decreased since his last check 2 years ago. He continues to experience SOB. [facility] LCSW provided coping skill education on how to cope with anxiety and difficulty breathing. Education provided on how to induce the relaxation body response to help with deep breathing whenever needed.  . Patient is agreeable to contact PCP to reschedule office visit as he has another appointment that day.  Patient Self Care Activities:  . Attends all scheduled provider appointments . Calls provider office for new concerns or questions  Please see past updates related to this goal by clicking on the "Past Updates" button in the selected goal    Task: Identify Anxiety Symptoms and Facilitate Treatment   Note:   Care Management Activities:    - participation in psychiatric services encouraged    Notes:    Patient Care Plan: RNCM: COPD (Adult)    Problem Identified: RNCM: Psychological Adjustment to Diagnosis (COPD)   Priority: Medium    Long-Range Goal: RNCM: Adjustment to Disease Achieved   Priority: Medium  Note:   Current Barriers:  [name] Knowledge deficits related to basic understanding of COPD disease process . Knowledge deficits related to basic COPD self  care/management . Knowledge deficit related to basic understanding of how to use inhalers and how inhaled medications work . Knowledge deficit related to importance of energy conservation . Limited Social Support . Lacks social connections . Does not maintain contact with provider office . Does not contact provider office for questions/concerns  Case Manager Clinical Goal(s):  patient will report using inhalers as prescribed including rinsing mouth after use  patient will report utilizing pursed lip breathing for shortness of breath  patient will verbalize understanding of COPD action plan and when to seek appropriate levels of medical care  patient will engage in lite exercise as tolerated to build/regain stamina and strength and reduce shortness of breath through activity tolerance  patient will verbalize basic understanding of COPD disease process and self care activities  patient will not be hospitalized for COPD exacerbation as evidenced  Interventions:  . Collaboration with [name], NP regarding development and update of comprehensive plan of care as evidenced by provider attestation and co-signature . Inter-disciplinary care team collaboration (see longitudinal plan of care)  Provided patient with basic written and verbal COPD education on self care/management/and exacerbation prevention  Provided patient with COPD action plan and reinforced importance of daily self assessment  Discussed Pulmonary Rehab and offered to assist with referral placement  Provided written and verbal instructions on pursed lip breathing and utilized returned demonstration as teach back  Provided instruction about proper use of medications used for management of COPD including inhalers  Advised patient to self assesses COPD action plan zone and make appointment with provider if in the yellow zone for 48 hours without improvement.  Provided patient with education about the role of  exercise in the management of COPD  Advised patient to engage in light exercise as tolerated 3-5 days a week  Provided education about and advised patient to utilize infection prevention strategies to reduce risk of respiratory infection  Self-Care Activities:  Self administers medications as prescribed Attends all scheduled provider appointments Calls pharmacy for medication refills Attends church or other social activities Performs ADL's independently Performs IADL's independently Calls provider office for new concerns or questions Patient Goals: - do breathing exercises every day - do breathing exercises at least 2 times each day - do exercises in a comfortable position that makes breathing as easy as possible - develop a new routine to improve sleep - don't eat or exercise right before bedtime - eat healthy - get at least 7 to 8 hours of sleep at night - get outdoors every day (weather permitting) - keep room cool and dark - limit daytime naps - practice relaxation or meditation daily - use a fan or white noise in bedroom - develop a rescue plan - eliminate symptom triggers at home - follow rescue plan if symptoms flare-up - keep follow-up appointments - use an extra pillow to sleep - identify and avoid work-related triggers - identify and remove indoor air pollutants - limit outdoor activity during cold weather - listen for public air quality announcements every day - counseling provided - decision-making supported - depression screen reviewed - emotional support provided - family involvement promoted - problem-solving facilitated - relaxation techniques promoted - verbalization of feelings encouraged Follow Up Plan: Telephone follow up appointment with care management team member scheduled for: 06-10-2020 at 3:45 pm    Task: RNCM: Support Psychosocial Response to Chronic Obstructive Pulmonary Disease   Note:   Care Management Activities:    - counseling  provided - decision-making supported - depression screen reviewed - emotional support provided - family involvement promoted - problem-solving facilitated - relaxation techniques promoted - verbalization of feelings encouraged       Patient Care Plan: RNCM: Chronic Pain (Adult)    Problem Identified: RNCM: Pain Management Plan (Chronic Pain)   Priority: High    Long-Range Goal: RNCM: Pain Management Plan Developed   Priority: High  Note:   Current Barriers:  [name] Knowledge Deficits related to managing acute/chronic pain . Non-adherence to scheduled provider appointments . Non-adherence to prescribed medication regimen . Difficulty obtaining medications . Chronic Disease Management support and education needs related to chronic pain . Lacks social connections . Does not maintain contact with provider office . Does not contact provider office for questions/concerns Nurse Case Manager Clinical Goal(s):  . patient will verbalize understanding of plan for managing pain . patient will attend all scheduled medical appointments: 04-28-2020 . patient will demonstrate use of different relaxation  skills and/or diversional activities to assist with pain reduction (distraction, imagery, relaxation, massage, acupressure, TENS, heat, and cold application . patient will report pain at a level less than 3 to 4 on a 10-10 rating scale .  patient will use pharmacological and nonpharmacological pain relief strategies . patient will verbalize acceptable level of pain relief and ability to engage in desired activities . patient will engage in desired activities without an increase in pain level Interventions:  . Collaboration with [name], NP regarding development and update of comprehensive plan of care as evidenced by provider attestation and co-signature . Inter-disciplinary care team collaboration (see longitudinal plan of care) . - deep breathing, relaxation and mindfulness use  promoted . - effectiveness of pharmacologic therapy monitored . - misuse of pain medication assessed . - motivation and barriers to change assessed and addressed . - mutually acceptable comfort goal set . - pain assessed . - pain treatment goals reviewed . Evaluation of current treatment plan related to pain and patient's adherence to plan as established by provider. The patient states he is having a lot of pain in his mouth due to ill fitting dentures. He can not eat and his mouth is sore. He says they have really messed his mouth up and he is tired of being in pain. Empathetic listening and support given.  . Advised patient to call the office for worsening pain or intensity of pain, also advised the patient to contact the specialist working with his dental work to see what alternatives he has due to ill fitting dentures.  . Provided education to patient re: alternate pain medications and options . Reviewed medications with patient and discussed states that the only medication that helps his pain is the oxycodone and he does not have anymore.  [name] Collaborated with pcp regarding the patients request for an RX for oxycodone. Explained to the patient that this is not something the pcp typically does. The patient became upset and could not understand why anyone but the cancer provider would give him a script for oxycodone. When  attempting to explain the process the patient cut the call short and said he would talk to the [facility] again but they had come to pick his truck up to be repaired.  . Discussed plans with patient for ongoing care management follow up and provided patient with direct contact information for care management team . Allow patient to maintain a diary of pain ratings, timing, precipitating events, medications, treatments, and what works best to relieve pain,  . Refer to support groups and self-help groups . Educate patient about the use of pharmacological interventions for pain management-  antianxiety, antidepressants, NSAIDS, opioid analgesics,  . Explain the importance of lifestyle modifications to effective pain management  Patient Goals/Self Care Activities:  . - mutually acceptable comfort goal set . - pain assessed . - pain management plan developed . - pain treatment goals reviewed . - patient response to treatment assessed . - sharing of pain management plan with teachers and other caregivers encouraged . Self-administers medications as prescribed . Attends all scheduled provider appointments . Calls pharmacy for medication refills . Calls provider office for new concerns or questions Follow Up Plan: Telephone follow up appointment with care management team member scheduled for:06-10-2020 at 3:45 pm     Task: RNCM: Partner to Develop Chronic Pain Management Plan   Note:   Care Management Activities:    - mutually acceptable comfort goal set - pain assessed - pain management plan developed - pain treatment goals reviewed - patient response to treatment assessed - sharing of pain management plan with teachers and other caregivers encouraged       Patient Care Plan: RNCM: Anxiety  Problem Identified: RNCM: Anxiety Identification (Anxiety)   Priority: Medium    Long-Range Goal: RNCM: Anxiety Symptoms Identified   Priority: Medium  Note:   Current Barriers:  . Chronic Disease Management support and education needs related to effective management of anxiety  . Lacks caregiver support.  . Unable to independently manage anxiety  . Lacks social connections . Does not maintain contact with provider office . Does not contact provider office for questions/concerns  Nurse Case Manager Clinical Goal(s):  . patient will verbalize understanding of plan for effective management of anxiety  . patient will work with [facility] [place] [facility] team, and pcp  to address needs related to management of Anxiety  . patient will demonstrate a decrease in anxiety  exacerbations as  evidenced by no exacerbations of anxiety, stabilized mood, and eliminating stressors in his life that cause anxiety  . patient will attend all scheduled medical appointments: 04-28-2020 at 11 am  Interventions:  . 1:1 collaboration with [name], NP regarding development and update of comprehensive plan of care as evidenced by provider attestation and co-signature . Inter-disciplinary care team collaboration (see longitudinal plan of care) . Evaluation of current treatment plan related to anxiety  and patient's adherence to plan as established by provider. . Advised patient to call the office for changes in anxiety/mood/ or increased depression  . Provided education to patient re: alternative ways to deal with anxiety  . Collaborated with pcp and LCSW regarding periods of anxiety  . Reviewed scheduled/upcoming provider appointments including: 04-28-2020 at 11 am . Discussed plans with patient for ongoing care management follow up and provided patient with direct contact information for care management team  Patient Goals/Self-Care Activities Over the next 120 days, patient will:  - Patient will self administer medications as prescribed Patient will attend all scheduled provider appointments Patient will call pharmacy for medication refills Patient will attend church or other social activities Patient will continue to perform ADL's independently Patient will continue to perform IADL's independently Patient will call provider office for new concerns or questions Patient will work with BSW to address care coordination needs and will continue to work with the clinical team to address health care and disease management related needs.   - anxiety screen reviewed - depression screen reviewed - somatic and anxiety symptoms correlated  Follow Up Plan: Telephone follow up appointment with care management team member scheduled for: 06-10-2020 at 3:45 pm       Task: RNCM: Identify Anxiety  Symptoms and Facilitate Treatment   Note:   Care Management Activities:    - anxiety screen reviewed - depression screen reviewed - somatic and anxiety symptoms correlated         The patient verbalized understanding of instructions, educational materials, and care plan provided today and declined offer to receive copy of patient instructions, educational materials, and care plan.   Telephone follow up appointment with care management team member scheduled for: 06-10-2020 at 3:45 pm  Noreene Larsson RN, MSN, [facility] Mobile: [phone]

## 2020-03-31 ENCOUNTER — Ambulatory Visit: Payer: Medicare Other | Admitting: Pharmacist

## 2020-03-31 DIAGNOSIS — J449 Chronic obstructive pulmonary disease, unspecified: Secondary | ICD-10-CM

## 2020-03-31 DIAGNOSIS — R52 Pain, unspecified: Secondary | ICD-10-CM

## 2020-03-31 DIAGNOSIS — F411 Generalized anxiety disorder: Secondary | ICD-10-CM

## 2020-03-31 NOTE — Progress Notes (Signed)
Chronic Care Management Pharmacy Note  04/07/2020 Name:  Kevin Nelson MRN:  [id number] DOB:  [birth year]  Subjective: Kevin Nelson is an 70 y.o. year old male who is a primary patient of Cannady, [name], NP.  The CCM team was consulted for assistance with disease management and care coordination needs.    Engaged with patient by telephone for follow up visit in response to provider referral for pharmacy case management and/or care coordination services.   Consent to Services:  The patient was given information about Chronic Care Management services, agreed to services, and gave verbal consent prior to initiation of services.  Please see initial visit note for detailed documentation.   Patient Care Team: [name], NP as PCP - General (Nurse Practitioner) Greg Cutter, LCSW as Social Worker (Licensed Clinical Social Worker) Hall Busing, Nobie Putnam, RN as Case Manager (General Practice) Vladimir Faster, [facility] (Pharmacist)  Recent office visits: Upcoming in April  Recent consult visits: 03/26/20-Samuelson (DDS)- addition to prep surface for denture 03/18/20- Merritt( Dermatology)-MOHS procedure 03/04/20- Olena Heckle (Palliative)- request to go back to [facility] visits: None in previous 6 months  Objective:  Lab Results  Component Value Date   CREATININE 0.57 (L) 01/28/2020   BUN 9 01/28/2020   GFRNONAA 106 01/28/2020   GFRAA 123 01/28/2020   NA 141 01/28/2020   K 5.0 01/28/2020   CALCIUM 8.9 01/28/2020   CO2 34 (H) 01/28/2020    Lab Results  Component Value Date/Time   HGBA1C 5.1 01/28/2020 11:09 AM   HGBA1C 5.0 11/29/2019 09:53 AM    Last diabetic Eye exam: No results found for: HMDIABEYEEXA  Last diabetic Foot exam: No results found for: HMDIABFOOTEX   Lab Results  Component Value Date   CHOL 207 (H) 01/28/2020   HDL 92 01/28/2020   LDLCALC 97 01/28/2020   TRIG 105 01/28/2020    Hepatic Function Latest Ref Rng & Units 12/21/2019 11/29/2019 08/20/2019  Total  Protein 6.5 - 8.1 g/dL 7.3 6.4 6.7  Albumin 3.5 - 5.0 g/dL 4.4 4.1 3.7  AST 15 - 41 U/L _0 ALT 0 - 44 U/L _1 Alk Phosphatase 38 - 126 U/L 49 66 56  Total Bilirubin 0.3 - 1.2 mg/dL 0.9 0.5 0.6  Bilirubin, Direct 0.0 - 0.2 mg/dL - - -    Lab Results  Component Value Date/Time   TSH 0.864 01/28/2020 11:09 AM   TSH 0.914 11/29/2019 10:34 AM    CBC Latest Ref Rng & Units 01/28/2020 12/21/2019 11/29/2019  WBC 3.4 - 10.8 x10E3/uL 8.7 7.1 4.7  Hemoglobin 13.0 - 17.7 g/dL 12.2(L) 12.2(L) 11.9(L)  Hematocrit 37.5 - 51.0 % 38.4 39.0 36.5(L)  Platelets 150 - 450 x10E3/uL 225 245 302    No results found for: VD25OH  Clinical ASCVD: Yes  The ASCVD Risk score Mikey Bussing DC Jr., et al., 2013) failed to calculate for the following reasons:   The patient has a prior MI or stroke diagnosis    Depression screen [facility] 2/9 02/01/2020 01/28/2020 09/18/2019  Decreased Interest 0 0 0  Down, Depressed, Hopeless 0 0 0  PHQ - 2 Score 0 0 0  Altered sleeping - 0 0  Tired, decreased energy - 1 1  Change in appetite - 1 2  Feeling bad or failure about yourself  - 0 0  Trouble concentrating - 0 0  Moving slowly or fidgety/restless - 0 0  Suicidal thoughts - 0 0  PHQ-9 Score -  2 3  Difficult doing work/chores - Not difficult at all Not difficult at all  Some recent data might be hidden     No flowsheet data found.   Social History   Tobacco Use  Smoking Status Current Every Day Smoker  . Packs/day: 0.50  . Years: 52.00  . Pack years: 26.00  . Types: Cigarettes  Smokeless Tobacco Never Used  Tobacco Comment   8 cigarettes a day 01/31/2020   BP Readings from Last 3 Encounters:  01/31/20 100/60  01/28/20 (!) 93/56  01/01/20 102/62   Pulse Readings from Last 3 Encounters:  01/31/20 94  01/28/20 96  01/01/20 91   Wt Readings from Last 3 Encounters:  02/01/20 100 lb (45.4 kg)  01/31/20 100 lb (45.4 kg)  01/28/20 99 lb 6.4 oz (45.1 kg)    Assessment/Interventions: Review of patient  past medical history, allergies, medications, health status, including review of consultants reports, laboratory and other test data, was performed as part of comprehensive evaluation and provision of chronic care management services.   SDOH:  (Social Determinants of Health) assessments and interventions performed: No    Immunization History  Administered Date(s) Administered  . Influenza-Unspecified 10/18/2017  . Moderna Sars-Covid-2 Vaccination 08/13/2019, 09/13/2019    Conditions to be addressed/monitored:  COPD, Tobacco use and anxiety  Care Plan : [place]  Updates made by Vladimir Faster, [place] since 04/07/2020 12:00 AM    Problem: COPD, Acute onn chronic pain, CAD, GAD   Priority: High  Onset Date: 03/31/2020    Long-Range Goal: Disease management   Start Date: 03/31/2020  This Visit's Progress: Not on track  Priority: High  Note:   Current Barriers:  . Unable to independently monitor therapeutic efficacy . Unable to maintain control of COPD . Unable to self administer medications as prescribed . Does not adhere to prescribed medication regimen    Pharmacist Clinical Goal(s):  [name] Over the next 90 days, patient will verbalize ability to afford treatment regimen . achieve adherence to monitoring guidelines and medication adherence to achieve therapeutic efficacy . achieve control of COPD as evidenced by patient interview . adhere to plan to optimize therapeutic regimen for COPD as evidenced by report of adherence to recommended medication management changes through collaboration with PharmD and provider.    Interventions: . 1:1 collaboration with [name], NP regarding development and update of comprehensive plan of care as evidenced by provider attestation and co-signature . Inter-disciplinary care team collaboration (see longitudinal plan of care) . Comprehensive medication review performed; medication list updated in electronic medical  record  COPD (Goal: control symptoms and prevent exacerbations) -Not ideally controlled -Current treatment  . Duoneb 1 nebule bid (prescribed qid by pulmonology) . Albuterol nebules prn . Albuterol MDI prn . Oxygen 4L/min -Medications previously tried: Stiolto  -Gold Grade: Gold 4 (FEV1<40%) -Current COPD Classification:  D (high sx, >/=2 exacerbations/yr) -Pulmonary function testing:  FVC: 2.28 L (56 %pred), FEV1: 0.80 L (25 %pred), FEV1/FVC: 35%, TLC: 7.77 L (121 %pred), DLCO 45 %pred.  Flow volume curve consistent with very severe  Patient follow with Pulmonolgy -Exacerbations requiring treatment in last 6 months:  -Patient denies consistent use of maintenance inhaler -Frequency of rescue inhaler use: bid -Counseled on Differences between maintenance and rescue inhalers -Recommended to continue current medication Counseled on benefit on nebulizer given his significantly reduced lung funtion Recommended patient discuss inhaler options with pulmonolgy and increase duoneb frequency to qid as prescribed   Depression/Anxiety (Goal: redused  symptoms) -Not ideally controlled -Current treatment: . Clonazepam 1 mg bid prn -Medications previously tried/failed: NA -PHQ9:  PHQ9 SCORE ONLY 02/01/2020 01/28/2020 09/18/2019  PHQ-9 Total Score 0 2 3    -GAD7:  GAD 7 : Generalized Anxiety Score 01/28/2020 09/18/2019 06/05/2019 03/05/2019  Nervous, Anxious, on Edge _0 Control/stop worrying 1 0 2 3  Worry too much - different things _1 Trouble relaxing 1 0 2 0  Restless 1 0 2 0  Easily annoyed or irritable 0 _2 Afraid - awful might happen 0 _3 Total GAD 7 Score _4 Anxiety Difficulty Not difficult at all Not difficult at all Somewhat difficult Somewhat difficult     -Connected with LCSW for mental health support -Educated on Benefits of cognitive-behavioral therapy with or without medication -Recommended to continue current medication Recommended patient consider  referral to pain management given anxiety surroundin ongoing dental pain and inabilty to get further opioids Collaborated with PCP for pain management referral  Tobacco use (Goal cessation) -Not ideally controlled -Previous quit attempts: NA -Current treatment  . NONE -Patient smokes Within 30 minutes of waking -Patient triggers include: stress and anxiety  -On a scale of 1-10, reports MOTIVATION to quit is 0 -On a scale of 1-10, reports CONFIDENCE in quitting is 0 -Provided contact information for [place] [facility]uit [facility] (1-800-QUIT-NOW) and encouraged patient to reach out to this group for support. -Recommended patient cut back on cigarettes   Patient Goals/Self-Care Activities . Over the next 90 days, patient will:  - take medications as prescribed focus on medication adherence by fill dates collaborate with provider on medication access solutions  Follow Up Plan: Telephone follow up appointment with care management team member scheduled for: 3 months PharmD, 1 month CPA        Medication Assistance: None required.  Patient affirms current coverage meets needs.  Patient's preferred pharmacy is:  [facility] ST[facility] #[id number] [address] [address], [place] - [address] AT [facility] [place] [address] Phone: [phone] Fax: [phone]  Uses pill box? No - fiancee handles medication Pt endorses 70%  compliance  We discussed: Benefits of medication synchronization, packaging and delivery as well as enhanced pharmacist oversight with [employer]. Patient decided to: Continue current medication management strategy  Care Plan and Follow Up Patient Decision:  Patient agrees to Care Plan and Follow-up.  Plan: Telephone follow up appointment with care management team member scheduled for:  2 months  Junita Push. Kenton Kingfisher PharmD, [facility] [phone]

## 2020-04-07 NOTE — Patient Instructions (Addendum)
Visit Information  It was a pleasure speaking with you today. Thank you for letting me be part of your clinical team. Please call with any questions or concerns.   Goals Addressed            This Visit's Progress   . Track and Manage My Symptoms-COPD       Timeframe:  Short-Term Goal Priority:  High Start Date:                             Expected End Date:                       Follow Up Date  2 month follow up    - develop a rescue plan - eliminate symptom triggers at home - keep follow-up appointments    Why is this important?    Tracking your symptoms and other information about your health helps your doctor plan your care.   Write down the symptoms, the time of day, what you were doing and what medicine you are taking.   You will soon learn how to manage your symptoms.     Notes:     . Track and Manage My Triggers-COPD       Timeframe:  Short-Term Goal Priority:  High Start Date:                             Expected End Date:                       Follow Up Date 2 month follow up    - eliminate smoking in my home - identify and remove indoor air pollutants - listen for public air quality announcements every day    Why is this important?    Triggers are activities or things, like tobacco smoke or cold weather, that make your COPD (chronic obstructive pulmonary disease) flare-up.   Knowing these triggers helps you plan how to stay away from them.   When you cannot remove them, you can learn how to manage them.     Notes:        The patient verbalized understanding of instructions, educational materials, and care plan provided today and declined offer to receive copy of patient instructions, educational materials, and care plan.   Telephone follow up appointment with pharmacy team member scheduled for: 2 months PharmD  Junita Push. Kenton Kingfisher PharmD, BCPS Clinical Pharmacist [phone]  Managing Pain Without Opioids Opioids are strong medicines used to  treat moderate to severe pain. For some people, especially those who have long-term (chronic) pain, opioids may not be the best choice for pain management due to:  Side effects like nausea, constipation, and sleepiness.  The risk of addiction (opioid use disorder). The longer you take opioids, the greater your risk of addiction. Pain that lasts for more than 3 months is called chronic pain. Managing chronic pain usually requires more than one approach and is often provided by a team of health care providers working together (multidisciplinary approach). Pain management may be done at a pain management center or pain clinic. Types of pain management without opioids Managing pain without opioids can involve:  Non-opioid medicines.  Exercises to help relieve pain and improve strength and range of motion (physical therapy).  Therapy to help with everyday tasks and activities (occupational therapy).  Therapy to  help you find ways to relieve pain by doing things you enjoy (recreational therapy).  Talk therapy (psychotherapy) and other mental health therapies.  Medical treatments such as injections or devices.  Making lifestyle changes. Pain management options Non-opioid medicines Non-opioid medicines for pain may include medicines taken by mouth (oral medicines), such as:  Over-the-counter or prescription NSAIDs. These may be the first medicines used for pain. They work well for muscle and bone pain, and they reduce swelling.  Acetaminophen. This over-the-counter medicine may work well for milder pain but not swelling.  Antidepressants. These may be used to treat chronic pain. A certain type of antidepressant (tricyclics) is often used. These medicines are given in lower doses for pain than when used for depression.  Anticonvulsants. These are usually used to treat seizures but may also reduce nerve (neuropathic) pain.  Muscle relaxants. These relieve pain caused by sudden muscle  tightening (spasms). You may also use a type of pain medicine that is applied to the skin as a patch, cream, or gel (topical analgesic), such as a numbing medicine. These may cause fewer side effects than oral medicines. Therapy Physical therapy involves doing exercises to gain strength and flexibility. A physical therapist may teach you exercises to move and stretch parts of your body that are weak, stiff, or painful. You can learn these exercises at physical therapy visits and practice them at home. Physical therapy may also involve:  Massage.  Heat wraps or applying heat or cold to affected areas.  Sending electrical signals through the skin to interrupt pain signals (transcutaneous electrical nerve stimulation, TENS).  Sending weak lasers through the skin to reduce pain and swelling (low-level laser therapy).  Using signals from your body to help you learn to regulate pain (biofeedback). Occupational therapy helps you learn ways to function at home and work with less pain. Recreational therapy may involve trying new activities or hobbies, such as drawing or a physical activity. Types of mental health therapy for pain include:  Cognitive behavioral therapy (CBT) to help you learn coping skills for dealing with pain.  Acceptance and commitment therapy (ACT) to change the way you think and react to pain.  Relaxation therapies, including muscle relaxation exercises and focusing your mind on the present moment to lower stress (mindfulness-based stress reduction).  Pain management counseling. This may be individual, family, or group counseling.   Medical treatments Medical treatments for pain management include:  Nerve block injections. These may include a pain blocker and anti-inflammatory medicines. You may have injections: ? Near the spine to relieve chronic back or neck pain. ? Into joints to relieve back or joint pain. ? Into nerve areas that supply a painful area to relieve body  pain. ? Into muscles (trigger point injections) to relieve some painful muscle conditions.  A medical device placed near your spine to help block pain signals and relieve nerve pain or chronic back pain (spinal cord stimulation device).  Acupuncture. Follow these instructions at home Medicines  Take over-the-counter and prescription medicines only as told by your health care provider.  If you are taking pain medicine, ask your health care providers about possible side effects to watch out for.  Do not drive or use heavy machinery while taking prescription pain medicine. Lifestyle  Do not use drugs or alcohol to reduce pain. Limit alcohol intake to no more than 1 drink a day for nonpregnant women and 2 drinks a day for men. One drink equals 12 oz of beer, 5 oz of  wine, or 1 oz of hard liquor.  Do not use any products that contain nicotine or tobacco, such as cigarettes and e-cigarettes. These can delay healing. If you need help quitting, ask your health care provider.  Eat a healthy diet and maintain a healthy weight. Poor diet and excess weight may make pain worse. ? Eat foods that are high in fiber. These include fresh fruits and vegetables, whole grains, and beans. ? Limit foods that are high in fat and processed sugars, such as fried and sweet foods.  Exercise regularly. Exercise lowers stress and may help relieve pain. ? Ask your health care provider what activities and exercises are safe for you. ? If your health care provider approves, join an exercise class that combines movement and stress reduction. Examples include yoga and tai chi.  Get enough sleep. Lack of sleep may make pain worse.  Lower stress as much as possible. Practice stress reduction techniques as told by your therapist.   General instructions  Work with all your pain management providers to find the treatments that work best for you. You are an important member of your pain management team. There are many  things you can do to reduce pain on your own.  Consider joining an online or in-person support group for people who have chronic pain.  Keep all follow-up visits as told by your health care providers. This is important. Where to find more information You can find more information about managing pain without opioids from:  American Academy of Pain Medicine: painmed.[place] for Chronic Pain: instituteforchronicpain.org  American Chronic Pain Association: theacpa.org Contact a health care provider if:  You have side effects from pain medicine.  Your pain gets worse or does not get better with treatments or home care.  You are struggling with anxiety or depression. Summary  Many types of pain can be managed without opioids. Chronic pain may respond better to pain management without opioids.  Pain is best managed with a team of providers working together.  Pain management without opioids may include non-opioid medicines, medical treatments, physical therapy, mental health therapy, and lifestyle changes.  Tell your health care providers if your pain gets worse or is not being managed well enough. This information is not intended to replace advice given to you by your health care provider. Make sure you discuss any questions you have with your health care provider. Document Revised: 10/18/2019 Document Reviewed: 10/18/2019 Elsevier Patient Education  [place].

## 2020-04-11 DIAGNOSIS — Z79899 Other long term (current) drug therapy: Secondary | ICD-10-CM | POA: Diagnosis not present

## 2020-04-11 DIAGNOSIS — C329 Malignant neoplasm of larynx, unspecified: Secondary | ICD-10-CM | POA: Diagnosis not present

## 2020-04-11 DIAGNOSIS — Z9981 Dependence on supplemental oxygen: Secondary | ICD-10-CM | POA: Diagnosis not present

## 2020-04-11 DIAGNOSIS — Z7951 Long term (current) use of inhaled steroids: Secondary | ICD-10-CM | POA: Diagnosis not present

## 2020-04-11 DIAGNOSIS — F17210 Nicotine dependence, cigarettes, uncomplicated: Secondary | ICD-10-CM | POA: Diagnosis not present

## 2020-04-11 DIAGNOSIS — J449 Chronic obstructive pulmonary disease, unspecified: Secondary | ICD-10-CM | POA: Diagnosis not present

## 2020-04-11 DIAGNOSIS — R1310 Dysphagia, unspecified: Secondary | ICD-10-CM | POA: Diagnosis not present

## 2020-04-11 DIAGNOSIS — I2510 Atherosclerotic heart disease of native coronary artery without angina pectoris: Secondary | ICD-10-CM | POA: Diagnosis not present

## 2020-04-14 ENCOUNTER — Ambulatory Visit: Payer: Medicare Other | Admitting: Radiation Oncology

## 2020-04-15 ENCOUNTER — Other Ambulatory Visit: Payer: Medicare Other | Admitting: Adult Health Nurse Practitioner

## 2020-04-17 ENCOUNTER — Telehealth: Payer: Self-pay | Admitting: Oncology

## 2020-04-17 NOTE — Telephone Encounter (Signed)
I tried calling pt and no answer. I then called [name] the girlfriend  and got her voicemail. I left message that we can change the appt. If it interferes with appts or if the dentistry is needing him there then we can r/s it. We still need to see him to keep up with cancer status and monitor. Just give [place] a call and we will change appt to make it work for pt. Left my direct number

## 2020-04-17 NOTE — Telephone Encounter (Signed)
Pt unable to make appointment on 4/4 for lab/md.  Requesting call back from nurse to determine if it is needed as he is currently being seen at [facility].  Routing to clinical team for follow up.

## 2020-04-21 ENCOUNTER — Telehealth: Payer: Self-pay | Admitting: *Deleted

## 2020-04-21 ENCOUNTER — Inpatient Hospital Stay: Payer: Medicare Other | Admitting: Oncology

## 2020-04-21 ENCOUNTER — Inpatient Hospital Stay: Payer: Medicare Other

## 2020-04-21 ENCOUNTER — Ambulatory Visit (INDEPENDENT_AMBULATORY_CARE_PROVIDER_SITE_OTHER): Payer: Medicare Other | Admitting: Licensed Clinical Social Worker

## 2020-04-21 DIAGNOSIS — J449 Chronic obstructive pulmonary disease, unspecified: Secondary | ICD-10-CM

## 2020-04-21 DIAGNOSIS — C329 Malignant neoplasm of larynx, unspecified: Secondary | ICD-10-CM

## 2020-04-21 DIAGNOSIS — F411 Generalized anxiety disorder: Secondary | ICD-10-CM

## 2020-04-21 DIAGNOSIS — R52 Pain, unspecified: Secondary | ICD-10-CM

## 2020-04-21 NOTE — Telephone Encounter (Signed)
[name] called about the appointments that was canceled.  I told [name] that I canceled the appointment for 4/ 4 because that was the message that Hassan Rowan had sent me.  Chong Sicilian says that it was really the appointment to cancel Dr. Donella Stade.  They had other visits on 328 so that is why they wanted it to be canceled but the message was routed to me and I thought it was for his 4/ 4 appointment.  Not aware he had an appointment on 4/ 4.  Has a lot of appointments 1 for speech therapy swallow study then get to have a appointment for [name] herself then he has a lung appointment on 4/14 then he has stuff with dental getting his teeth fixed on 4/19 4/20.  I offered her appointment in May or the later part of April which ever they would like.  The patient is talking to [facility] and I can hear him on the phone that he does not want to come anymore.  I let [name] know that he will be monitored for his cancer for a long time to make sure there is no recurrence and no problems.  Patient thinks that he has enough problems of other things going on and it is just a anothervisit for him and he does not want to come.  I told [name] I would let the doctor know and that if anything comes up that he should definitely call us and get an appointment but otherwise no more follow-up per the patient request.  Dr. Janese Banks notified of this

## 2020-04-21 NOTE — Patient Instructions (Signed)
Licensed Clinical Social Worker Visit Information  Goals we discussed today:  Goals Addressed            This Visit's Progress   . SW-Track and Manage My Symptoms-Depression       Timeframe:  Long-Range Goal Priority:  Medium Start Date:   04/21/20                        Expected End Date:  07/21/20                  Follow Up Date- 06/06/20    Current Barriers:  [name] Knowledge Deficits related to community resources that are available to him and within his area . Financial Constraints - managing health care expenses  . Health related barriers-cancerous larynx  Clinical Social Worker Clinical Goal(s):  [name] Over the next 90 days, patient will verbalize understanding of plan for gaining crisis support resource education and implementing appropriate self-care tools into his daily routine to improve overall self-care . Over the next 90 days, patient will work with [facility] LCSW to address needs related to increasing self-care and self-care education to cope with financial barriers and stressors.    Interventions: . Patient interviewed and appropriate assessments performed . Provided mental health counseling with regard to coping with daily stress. [name] LCSW provided education on deep breathing and relaxation techniques to implement into his daily routine to combat stressors. Patient admits that his anxiety continues but he has been challenging his negative thinking in order to gain a more positive perspective. He shares that this one coping skill has been effectively working for him. Patient admits that he could benefit from socialization but due to [place] and his poor physical condition, he has been staying at home and taking appropriate precautions. LCSW provided education on available mental health support resources and socialization opportunities within his community. . Provided patient with information about low income housing resources that are available. However, currently Section 8 and Public  Housing wait list is still closed.  [name] LCSW provided reflective listening and implemented appropriate interventions to help suppport patient and his emotional needs  . Discussed plans with patient for ongoing care management follow up and provided patient with direct contact information for care management team . Advised patient to follow up with provided community resources as needed . Assisted patient/caregiver with obtaining information about health plan benefits . Patient was informed that current CCM LCSW will be leaving position next month and his next CCM Social Work follow up visit will be with another LCSW. Patient was appreciative of support provided and receptive to news . Patient has a PCP appointment on 04/28/20. Patient confirms stable transportation to this appointment.  . Patient confirms stable transportation to his upcoming PCP appointment next month. . Patient reports that his anxiety is "more manageable" but he decided to discontinue taking his celexa. Patient was adamant about not getting back on this medication or trying a new one. LCSW advised patient to contact [facility] if his anxiety or depression increases.  . Patient reports ongoing dryness of the mouth and dehydration. However, he is currently drinking pedialyte which has been helpful. Healthy self-care education provided.  . Evaluation of current status of Laryngeal cancer and treatment options. On 08/29/19, patient states that his cancer was successfully treated and he no longer has to receive chemotherapy.  . Patient reports that he continues to smoke only 5 cigarettes per day but desires to continue decreasing this  intake. Positive reinforcement provided.  [name] LCSW reviewed all upcoming medical appointments with patient. Patient reports having an upcoming [facility] on 11/19/19 where he will remove the last 4 teeth and roots. Patient has already had 4 removed thus far.  . Patient  reports that his dermatologist appointment for skin cancer is not until 12/22/19.  [name] Patient confirms that he and his spouse received the COVID vaccines. . Patient reports that he has successfully went from 88 lbs to 109 lbs but it has been tricky to eat with his dental issues and low appetite. Patient is no longer receiving chemo or radiation.    . Patient states that he bought himself a new motorcycle. He reports that riding his new bike has increased his overall mood and socialization.  . Patient had a recent pulmonologist appointment with Dr. Vernard Gambles and scheduled a follow up appointment with her as well. He shares that he received confirmation that his oxygen levels have decreased since his last check 2 years ago. He continues to experience SOB. CCM LCSW provided coping skill education on how to cope with anxiety and difficulty breathing. Education provided on how to induce the relaxation body response to help with deep breathing whenever needed.  . Patient is agreeable to contact CCM providers if any urgent concerns arise.   Patient Self Care Activities:  . Attends all scheduled provider appointments . Calls provider office for new concerns or questions  Please see past updates related to this goal by clicking on the "Past Updates" button in the selected goal         Eula Fried, [place], MSW, [email or web address] Phone: [phone]

## 2020-04-21 NOTE — Chronic Care Management (AMB) (Signed)
Chronic Care Management    Clinical Social Work Note  04/21/2020 Name: Kevin Nelson MRN: [id number] DOB: February 21, 1950  Kevin Nelson is a 70 y.o. year old male who is a primary care patient of Cannady, [name], NP. The CCM team was consulted to assist the patient with chronic disease management and/or care coordination needs related to: [employer]  and [place] and Resources.   Engaged with patient by telephone for follow up visit in response to provider referral for social work chronic care management and care coordination services.   Consent to Services:  The patient was given the following information about Chronic Care Management services today, agreed to services, and gave verbal consent: 1. CCM service includes personalized support from designated clinical staff supervised by the primary care provider, including individualized plan of care and coordination with other care providers 2. 24/7 contact phone numbers for assistance for urgent and routine care needs. 3. Service will only be billed when office clinical staff spend 20 minutes or more in a month to coordinate care. 4. Only one practitioner may furnish and bill the service in a calendar month. 5.The patient may stop CCM services at any time (effective at the end of the month) by phone call to the office staff. 6. The patient will be responsible for cost sharing (co-pay) of up to 20% of the service fee (after annual deductible is met). Patient agreed to services and consent obtained.  Patient agreed to services and consent obtained.   Assessment: Review of patient past medical history, allergies, medications, and health status, including review of relevant consultants reports was performed today as part of a comprehensive evaluation and provision of chronic care management and care coordination services.     SDOH (Social Determinants of Health) assessments and interventions performed:    Advanced Directives Status:  See Care Plan for related entries.  CCM Care Plan  No Known Allergies  Outpatient Encounter Medications as of 04/21/2020  Medication Sig  . albuterol (PROVENTIL) (2.5 MG/3ML) 0.083% nebulizer solution Take 3 mLs (2.5 mg total) by nebulization every 6 (six) hours as needed for wheezing or shortness of breath.  [name] albuterol (VENTOLIN HFA) 108 (90 Base) MCG/ACT inhaler INHALE 2 PUFFS INTO THE LUNGS EVERY 6 HOURS AS NEEDED FOR WHEEZING OR SHORTNESS OF BREATH  . clonazePAM (KLONOPIN) 1 MG tablet TAKE 1 TABLET(1 MG) BY MOUTH TWICE DAILY AS NEEDED FOR ANXIETY  . fluticasone (FLONASE) 50 MCG/ACT nasal spray SHAKE LIQUID AND USE 1 SPRAY IN EACH NOSTRIL TWICE DAILY  . ipratropium-albuterol (DUONEB) 0.5-2.5 (3) MG/3ML SOLN USE 3 ML VIA NEBULIZER EVERY 6 HOURS AS NEEDED (Patient taking differently: Only using bid)  . lidocaine (XYLOCAINE) 2 % solution 15 mL.  . lidocaine-prilocaine (EMLA) cream Apply topically once.  . naloxegol oxalate (MOVANTIK) 25 MG TABS tablet Take 1 tablet (25 mg total) by mouth daily.  [name] oxyCODONE (OXY IR/ROXICODONE) 5 MG immediate release tablet Take 1 tablet (5 mg total) by mouth every 8 (eight) hours as needed for severe pain. (Patient taking differently: Take 5 mg by mouth every 8 (eight) hours as needed for severe pain. Taking bid)  . OXYGEN Inhale 3 L into the lungs daily.  . predniSONE (DELTASONE) 20 MG tablet Take 2 tablets daily for 4 days then 1 tablet daily for 7 days then half tablet daily or as directed by MD  . triamcinolone (KENALOG) 0.1 % Apply topically.   No facility-administered encounter medications on file as of 04/21/2020.  Patient Active Problem List   Diagnosis Date Noted  . B12 deficiency 11/29/2019  . Goals of care, counseling/discussion 01/25/2019  . Squamous cell carcinoma of larynx ([place]) 01/04/2019  . Chronic nonintractable headache 11/10/2018  . Sensorineural hearing loss (SNHL) 08/21/2018  . Aortic atherosclerosis ([place]) 07/31/2018  . Chronic  hepatitis ([place]) 04/21/2018  . Lung nodule < 6cm on CT 04/19/2018  . Senile purpura ([place]) 04/19/2018  . Protein-calorie malnutrition ([place]) 04/19/2018  . Nicotine dependence, cigarettes, uncomplicated 24/09/7351  . Controlled substance agreement signed 01/27/2018  . Chronic hypoxemic respiratory failure ([place]) 01/26/2018  . COPD, very severe ([place]) 01/13/2018  . Prostate cancer ([place]) 01/13/2018  . History of basal cell carcinoma ([facility]) 01/13/2018  . Generalized anxiety disorder 01/13/2018    Conditions to be addressed/monitored: Anxiety and Depression; Limited social support, Mental Health Concerns  and Social Isolation  Care Plan : General Social Work (Adult)  Updates made by Greg Cutter, LCSW since 04/21/2020 12:00 AM    Problem: Anxiety Identification (Anxiety)     Long-Range Goal: Anxiety Symptoms Identified   Start Date: 04/21/2020  Priority: Medium  Note:   Timeframe:  Long-Range Goal Priority:  Medium Start Date:   04/21/20                        Expected End Date:  07/21/20                  Follow Up Date- 06/06/20    Current Barriers:  [name] Knowledge Deficits related to community resources that are available to him and within his area . Financial Constraints - managing health care expenses  . Health related barriers-cancerous larynx  Clinical Social Worker Clinical Goal(s):  [name] Over the next 90 days, patient will verbalize understanding of plan for gaining crisis support resource education and implementing appropriate self-care tools into his daily routine to improve overall self-care . Over the next 90 days, patient will work with [facility] LCSW to address needs related to increasing self-care and self-care education to cope with financial barriers and stressors.    Interventions: . Patient interviewed and appropriate assessments performed . Provided mental health counseling with regard to coping with daily stress. [name] LCSW provided education on deep breathing and relaxation  techniques to implement into his daily routine to combat stressors. Patient admits that his anxiety continues but he has been challenging his negative thinking in order to gain a more positive perspective. He shares that this one coping skill has been effectively working for him. Patient admits that he could benefit from socialization but due to [place] and his poor physical condition, he has been staying at home and taking appropriate precautions. LCSW provided education on available mental health support resources and socialization opportunities within his community. . Provided patient with information about low income housing resources that are available. However, currently Section 8 and Public Housing wait list is still closed.  [name] LCSW provided reflective listening and implemented appropriate interventions to help suppport patient and his emotional needs  . Discussed plans with patient for ongoing care management follow up and provided patient with direct contact information for care management team . Advised patient to follow up with provided community resources as needed . Assisted patient/caregiver with obtaining information about health plan benefits . Patient was informed that current CCM LCSW will be leaving position next month and his next CCM Social Work follow up visit will be with another LCSW. Patient was appreciative of  support provided and receptive to news . Patient has a PCP appointment on 04/28/20. Patient confirms stable transportation to this appointment.  . Patient confirms stable transportation to his upcoming PCP appointment next month. . Patient reports that his anxiety is "more manageable" but he decided to discontinue taking his celexa. Patient was adamant about not getting back on this medication or trying a new one. LCSW advised patient to contact [facility] if his anxiety or depression increases.  . Patient reports ongoing dryness of the mouth and dehydration. However, he is currently  drinking pedialyte which has been helpful. Healthy self-care education provided.  . Evaluation of current status of Laryngeal cancer and treatment options. On 08/29/19, patient states that his cancer was successfully treated and he no longer has to receive chemotherapy.  . Patient reports that he continues to smoke only 5 cigarettes per day but desires to continue decreasing this intake. Positive reinforcement provided.  [name] LCSW reviewed all upcoming medical appointments with patient. Patient reports having an upcoming dental appointment at [facility] on 11/19/19 where he will remove the last 4 teeth and roots. Patient has already had 4 removed thus far.  . Patient reports that his dermatologist appointment for skin cancer is not until 12/22/19.  [name] Patient confirms that he and his spouse received the COVID vaccines. . Patient reports that he has successfully went from 88 lbs to 109 lbs but it has been tricky to eat with his dental issues and low appetite. Patient is no longer receiving chemo or radiation.    . Patient states that he bought himself a new motorcycle. He reports that riding his new bike has increased his overall mood and socialization.  . Patient had a recent pulmonologist appointment with Dr. Vernard Gambles and scheduled a follow up appointment with her as well. He shares that he received confirmation that his oxygen levels have decreased since his last check 2 years ago. He continues to experience SOB. CCM LCSW provided coping skill education on how to cope with anxiety and difficulty breathing. Education provided on how to induce the relaxation body response to help with deep breathing whenever needed.  . Patient is agreeable to contact CCM providers if any urgent concerns arise.   Patient Self Care Activities:  . Attends all scheduled provider appointments . Calls provider office for new concerns or questions  Please see past updates related to this goal by  clicking on the "Past Updates" button in the selected goal       Follow Up Plan: SW will follow up with patient by phone over the next quarter      Eula Fried, [place], MSW, [email or web address] Phone: [phone]

## 2020-04-25 ENCOUNTER — Telehealth: Payer: Self-pay

## 2020-04-25 NOTE — Progress Notes (Signed)
Chronic Care Management Pharmacy Assistant   Name: Kevin Nelson  MRN: [id number] DOB: 1950-06-21   Reason for Encounter: Disease State- COPD Disease State Call  Recent office visits:  No visits noted  Recent consult visits:  04/15/20- Donley Redder( )-- [facility] ALVEOLOPLASTY NO EXT  04/11/20- Tiburcio Pea ( ENT)- [facility] visits:  None in previous 6 months  Medications: Outpatient Encounter Medications as of 04/25/2020  Medication Sig  . albuterol (PROVENTIL) (2.5 MG/3ML) 0.083% nebulizer solution Take 3 mLs (2.5 mg total) by nebulization every 6 (six) hours as needed for wheezing or shortness of breath.  [name] albuterol (VENTOLIN HFA) 108 (90 Base) MCG/ACT inhaler INHALE 2 PUFFS INTO THE LUNGS EVERY 6 HOURS AS NEEDED FOR WHEEZING OR SHORTNESS OF BREATH  . clonazePAM (KLONOPIN) 1 MG tablet TAKE 1 TABLET(1 MG) BY MOUTH TWICE DAILY AS NEEDED FOR ANXIETY  . fluticasone (FLONASE) 50 MCG/ACT nasal spray SHAKE LIQUID AND USE 1 SPRAY IN EACH NOSTRIL TWICE DAILY  . ipratropium-albuterol (DUONEB) 0.5-2.5 (3) MG/3ML SOLN USE 3 ML VIA NEBULIZER EVERY 6 HOURS AS NEEDED (Patient taking differently: Only using bid)  . lidocaine (XYLOCAINE) 2 % solution 15 mL.  . lidocaine-prilocaine (EMLA) cream Apply topically once.  . naloxegol oxalate (MOVANTIK) 25 MG TABS tablet Take 1 tablet (25 mg total) by mouth daily.  [name] oxyCODONE (OXY IR/ROXICODONE) 5 MG immediate release tablet Take 1 tablet (5 mg total) by mouth every 8 (eight) hours as needed for severe pain. (Patient taking differently: Take 5 mg by mouth every 8 (eight) hours as needed for severe pain. Taking bid)  . OXYGEN Inhale 3 L into the lungs daily.  . predniSONE (DELTASONE) 20 MG tablet Take 2 tablets daily for 4 days then 1 tablet daily for 7 days then half tablet daily or as directed by MD  . triamcinolone (KENALOG) 0.1 % Apply topically.   No facility-administered encounter medications on file as of  04/25/2020.   Current COPD regimen:  ipratropium-albuterol (DUONEB) 0.5-2.5 (3) MG/3ML SOLN- pt taking twice a day (prescribed qid by pulmonology) PROVENTIL (2.5 MG/3ML) 0.083% nebulizer- every six hours as needed Oxygen 4L/min VENTOLIN HFA 108 (90 Base) MCG/ACT inhaler- two puffs every six hours as needed- patient stated he is no longer taking because he does not feel like it helps alleviate his sxs  Any recent hospitalizations or ED visits since last visit with CPP? No   Patient Reports COPD symptoms, including Increased shortness of breath  and Rescue medicine is not helping. Patient stated that he has not been using his Ventolin inhaler because it is beyond him and his trouble breathing.   What recent interventions/DTPs have been made by any provider to improve breathing since last visit: No recent interventions  Have you had exacerbation/flare-up since last visit?  Patient stated that he has flare ups after walking or talking   What do you do when you are short of breath?   Patient stated that he uses his nebulizer when he is SOB  Respiratory Devices/Equipment Do you have a nebulizer? Yes Do you use a Peak Flow Meter? Yes Do you use a maintenance inhaler? Yes How often do you forget to use your daily inhaler? Patient stated that he uses his nebulizer daily not an inhaler. Do you use a rescue inhaler? Yes. Patient stated he has been using a Stiolto inhaler that was previously filled  How often do you use your rescue inhaler? Patient states he uses Stiolto daily  Do you use a spacer with your inhaler? No  Adherence Review: Does the patient have >5 day gap between last estimated fill date for maintenance inhaler medications? No ipratropium-albuterol 10DS last filled 03/12/20 VENTOLIN HFA 25DS last filled 04/10/20   Star Rating Drugs: No current   [facility] CPA, CMA

## 2020-04-28 ENCOUNTER — Ambulatory Visit (INDEPENDENT_AMBULATORY_CARE_PROVIDER_SITE_OTHER): Payer: Medicare Other | Admitting: Nurse Practitioner

## 2020-04-28 ENCOUNTER — Other Ambulatory Visit: Payer: Self-pay

## 2020-04-28 ENCOUNTER — Encounter: Payer: Self-pay | Admitting: Nurse Practitioner

## 2020-04-28 VITALS — BP 131/58 | HR 81 | Temp 98.0°F | Wt 100.4 lb

## 2020-04-28 DIAGNOSIS — Z85828 Personal history of other malignant neoplasm of skin: Secondary | ICD-10-CM

## 2020-04-28 DIAGNOSIS — I700 Atherosclerosis of aorta: Secondary | ICD-10-CM

## 2020-04-28 DIAGNOSIS — F17210 Nicotine dependence, cigarettes, uncomplicated: Secondary | ICD-10-CM

## 2020-04-28 DIAGNOSIS — D692 Other nonthrombocytopenic purpura: Secondary | ICD-10-CM | POA: Diagnosis not present

## 2020-04-28 DIAGNOSIS — C329 Malignant neoplasm of larynx, unspecified: Secondary | ICD-10-CM | POA: Diagnosis not present

## 2020-04-28 DIAGNOSIS — J9611 Chronic respiratory failure with hypoxia: Secondary | ICD-10-CM

## 2020-04-28 DIAGNOSIS — K739 Chronic hepatitis, unspecified: Secondary | ICD-10-CM | POA: Diagnosis not present

## 2020-04-28 DIAGNOSIS — F411 Generalized anxiety disorder: Secondary | ICD-10-CM | POA: Diagnosis not present

## 2020-04-28 DIAGNOSIS — K089 Disorder of teeth and supporting structures, unspecified: Secondary | ICD-10-CM | POA: Insufficient documentation

## 2020-04-28 DIAGNOSIS — J449 Chronic obstructive pulmonary disease, unspecified: Secondary | ICD-10-CM | POA: Diagnosis not present

## 2020-04-28 MED ORDER — TRAMADOL HCL 50 MG PO TABS: ORAL_TABLET | ORAL | 0 refills | Status: DC

## 2020-04-28 MED ORDER — CLONAZEPAM 1 MG PO TABS
ORAL_TABLET | ORAL | 2 refills | Status: DC
Start: 2020-04-28 — End: 2020-05-28

## 2020-04-28 NOTE — Progress Notes (Signed)
BP (!) 131/58   Pulse 81   Temp 98 F (36.7 C) (Oral)   Wt 100 lb 6.4 oz (45.5 kg)   SpO2 92%   BMI 15.72 kg/m    Subjective:    Patient ID: Kevin Nelson, male    DOB: 1950-07-23, 70 y.o.   MRN: [id number]  HPI: Kevin Nelson is a 70 y.o. male  Chief Complaint  Patient presents with  . Anxiety  . COPD  . Cancer  . Medication Refill    Patient states he has upcoming surgeries and was requesting refill on his oxycodone for at least 2 weeks until then.   COPD Followed by pulmonary, last saw Dr. Patsey Berthold on 01/01/20 + Dr. Ihor Austin with 04/11/20.  Continues Albuterol and Duoneb -- Combivent and Stiolto no longer on regimen -- was given Prednisone to take on 01/01/20 -- is to take Duoneb 5 times a day and Albuterol as needed.  Has diagnosis of laryngeal cancer, being followed by oncology and palliative team and receiving treatments with last infusion on 05/31/19 -- last saw Dr. Janese Banks 12/21/19.  Had a feeding tube in place, which was discontinued on 08/31/19.  Last chest CT on 01/16/20.  Last labs noted some mild low HGB, but upward trend.  Continues to smoke about 5 cigarettes a day, not interested in quitting -- is working on cut back.  Has history of Hep C treated by GI. COPD status: stable Satisfied with current treatment?: yes Oxygen use: yes Dyspnea frequency: occasional Cough frequency: chronic Rescue inhaler frequency:  3-4 times a week Limitation of activity: at times Productive cough: none Last Spirometry: with pulmonary Pneumovax: refused  Influenza: Up To Date  ANXIETY/STRESS Followed by palliative team at this time due to co morbidities and laryngeal CA.  Is taking Klonopin 1 MG BID PRN, last fill on 03/25/20 on PDMP review -- taking one a day Klonopin at this time.  He reports benefit from this regimen. Risks, benefits, side effects and alternative therapies were discussed.  The opportunity to ask questions was provided and they were answered to the best of my ability.  The  patient expressed understanding and willingness to follow the outlined treatment protocols and was able to verbalize plan of care back to provider.  Last UDS was 09/18/19, which did return positive for Oxycodone due to cancer treatment at time + MJ noted.    He also takes Oxycodone 5 MG as needed, prescribed by palliative NP with oncology -- filled 03/17/20 (#60 - 20 days) and Oxycontin filled last 12/17/19 -- has been weaned off these.  He reports need for some as needed pain medication with upcoming dental surgery and MOS procedures + stretching of esophagus.  Only has 1/2 an Oxycodone left to use for these procedures and very concerned about upcoming pain.    Had AT LENGTH discussion with him on pain medications, will send in short supply of Tramadol only -- he is aware PCP does not provide chronic pain medications and not to take this at same time as Klonopin.  It is for post-procedure pain only. Duration:stable Anxious mood: yes  Excessive worrying: yes Irritability: no  Sweating: no Nausea: no Palpitations:no Hyperventilation: no Panic attacks: no Agoraphobia: no  Obscessions/compulsions: no Depressed mood: yes Depression screen [facility] 2/9 04/28/2020 02/01/2020 01/28/2020 09/18/2019 06/05/2019  Decreased Interest 0 0 0 0 1  Down, Depressed, Hopeless 0 0 0 0 1  PHQ - 2 Score 0 0 0 0 2  Altered sleeping 0 - 0  0 3  Tired, decreased energy 0 - 1 1 1   Change in appetite 0 - 1 2 3   Feeling bad or failure about yourself  0 - 0 0 0  Trouble concentrating 0 - 0 0 1  Moving slowly or fidgety/restless 0 - 0 0 2  Suicidal thoughts 0 - 0 0 0  PHQ-9 Score 0 - 2 3 12   Difficult doing work/chores Not difficult at all - Not difficult at all Not difficult at all Not difficult at all  Some recent data might be hidden   GAD 7 : Generalized Anxiety Score 04/28/2020 01/28/2020 09/18/2019 06/05/2019  Nervous, Anxious, on Edge 1 1 1 3   Control/stop worrying 1 1 0 2  Worry too much - different things 1 1 1 2    Trouble relaxing 1 1 0 2  Restless 1 1 0 2  Easily annoyed or irritable 0 0 1 2  Afraid - awful might happen 0 0 1 2  Total GAD 7 Score 5 5 4 15   Anxiety Difficulty Not difficult at all Not difficult at all Not difficult at all Somewhat difficult   Relevant past medical, surgical, family and social history reviewed and updated as indicated. Interim medical history since our last visit reviewed. Allergies and medications reviewed and updated.  Review of Systems  Constitutional: Negative for activity change, diaphoresis, fatigue and fever.  Respiratory: Positive for cough (baseline) and wheezing (baseline). Negative for chest tightness and shortness of breath.   Cardiovascular: Negative for chest pain, palpitations and leg swelling.  Gastrointestinal: Negative.   Psychiatric/Behavioral: Negative.     Per HPI unless specifically indicated above     Objective:    BP (!) 131/58   Pulse 81   Temp 98 F (36.7 C) (Oral)   Wt 100 lb 6.4 oz (45.5 kg)   SpO2 92%   BMI 15.72 kg/m   Wt Readings from Last 3 Encounters:  04/28/20 100 lb 6.4 oz (45.5 kg)  02/01/20 100 lb (45.4 kg)  01/31/20 100 lb (45.4 kg)    Physical Exam Vitals and nursing note reviewed.  Constitutional:      General: He is awake. He is not in acute distress.    Appearance: He is well-developed and well-groomed. He is cachectic. He is not ill-appearing.     Comments: Frail appearing.  HENT:     Head: Normocephalic and atraumatic.     Right Ear: Hearing normal. No drainage.     Left Ear: Hearing normal. No drainage.  Eyes:     General: Lids are normal.        Right eye: No discharge.        Left eye: No discharge.     Conjunctiva/sclera: Conjunctivae normal.     Pupils: Pupils are equal, round, and reactive to light.  Neck:     Vascular: No carotid bruit.  Cardiovascular:     Rate and Rhythm: Normal rate and regular rhythm.     Heart sounds: Normal heart sounds, S1 normal and S2 normal. No murmur  heard. No gallop.   Pulmonary:     Effort: Pulmonary effort is normal. No accessory muscle usage or respiratory distress.     Breath sounds: Decreased breath sounds present.     Comments: Decreased breath sounds throughout, clear.  O2 on at 2L [place]. Abdominal:     General: Bowel sounds are normal.     Palpations: Abdomen is soft.  Musculoskeletal:        General: Normal  range of motion.     Cervical back: Normal range of motion and neck supple.     Right lower leg: No edema.     Left lower leg: No edema.  Skin:    General: Skin is warm and dry.     Comments: Scattered small pale purple bruises bilateral upper extremities.  Neurological:     Mental Status: He is alert and oriented to person, place, and time.     Deep Tendon Reflexes: Reflexes are normal and symmetric.  Psychiatric:        Attention and Perception: Attention normal.        Mood and Affect: Mood normal.        Speech: Speech normal.        Behavior: Behavior normal. Behavior is cooperative.        Thought Content: Thought content normal.    Results for orders placed or performed during the hospital encounter of 02/05/20  Glucose, capillary  Result Value Ref Range   Glucose-Capillary 91 70 - 99 mg/dL      Assessment & Plan:   Problem List Items Addressed This Visit      Cardiovascular and Mediastinum   Senile purpura ([place])    In presence of severe COPD and protein calorie malnutrition.  Discussed proper skin care, with gentle cleansing and use of Lubriderm lotion daily.  Monitor for skin breakdown and alert provider if present.      Aortic atherosclerosis ([place])    Noted on lung CT screening.  Recommend complete cessation of smoking.  Check lipid panel next visit, does not wish to start statin.        Respiratory   COPD, very severe ([place]) - Primary    Chronic, ongoing with O2 dependence.  Continue current medication regimen and collaboration with pulmonology.  Recommend complete smoking cessation, although  not interested in this at this time.  CBC, TSH, CMP next visit.  Return in 4 months for follow-up.      Chronic hypoxemic respiratory failure ([facility])    O2 dependent severe COPD.  Recommended use of O2 24 hours a day as recommended, at 2 L.  Continue to collaborate with pulmonology team.       Squamous cell carcinoma of larynx ([place])    Ongoing.  Continue collaboration with oncology & ENT, appreciate their input.  Recent notes reviewed.        Digestive   Chronic hepatitis ([place])    Followed by GI in past with treatment, currently stable.  Continue to monitor and return to GI as needed.      Poor dentition    Ongoing, continue collaboration with dental school [employer] and extractions.  Will send in short burst of Tramadol only, not chronic pain management and he is aware of this.  Is to use ONLY SPARINGLY and NO REFILLS will be provided on this.  Not to take at same time as Klonopin, provided lengthy education on this.        Musculoskeletal and Integument   History of basal cell carcinoma ([facility])    Ongoing, currently undergoing MOS procedures.  Continue to collaborate with dermatology.        Other   Generalized anxiety disorder    Chronic, ongoing.  Denies SI/HI.  Will continue current palliative regimen.  Patient aware of long term risks of benzos, wishes to continue at this time. Refills sent in.  Continue to collaborate with palliative team.  Return in 4 months.  Nicotine dependence, cigarettes, uncomplicated    I have recommended complete cessation of tobacco use. I have discussed various options available for assistance with tobacco cessation including over the counter methods (Nicotine gum, patch and lozenges). We also discussed prescription options (Chantix, Nicotine Inhaler / Nasal Spray). The patient is not interested in pursuing any prescription tobacco cessation options at this time.           Follow up plan: Return in about 4 months (around 08/28/2020) for COPD, MOOD,  ANEMIA -- UDS needed + blood work.

## 2020-04-28 NOTE — Assessment & Plan Note (Signed)
Ongoing, continue collaboration with dental [facility] [employer] and extractions.  Will send in short burst of Tramadol only, not chronic pain management and he is aware of this.  Is to use ONLY SPARINGLY and NO REFILLS will be provided on this.  Not to take at same time as Klonopin, provided lengthy education on this.

## 2020-04-28 NOTE — Assessment & Plan Note (Signed)
I have recommended complete cessation of tobacco use. I have discussed various options available for assistance with tobacco cessation including over the counter methods (Nicotine gum, patch and lozenges). We also discussed prescription options (Chantix, Nicotine Inhaler / Nasal Spray). The patient is not interested in pursuing any prescription tobacco cessation options at this time.  

## 2020-04-28 NOTE — Assessment & Plan Note (Signed)
In presence of severe COPD and protein calorie malnutrition.  Discussed proper skin care, with gentle cleansing and use of Lubriderm lotion daily.  Monitor for skin breakdown and alert provider if present.

## 2020-04-28 NOTE — Assessment & Plan Note (Signed)
Noted on lung CT screening.  Recommend complete cessation of smoking.  Check lipid panel next visit, does not wish to start statin.

## 2020-04-28 NOTE — Assessment & Plan Note (Signed)
Followed by GI in past with treatment, currently stable.  Continue to monitor and return to GI as needed.

## 2020-04-28 NOTE — Assessment & Plan Note (Addendum)
Chronic, ongoing with O2 dependence.  Continue current medication regimen and collaboration with pulmonology.  Recommend complete smoking cessation, although not interested in this at this time.  CBC, TSH, CMP next visit.  Return in 4 months for follow-up.

## 2020-04-28 NOTE — Patient Instructions (Signed)
Eating Plan for Chronic Obstructive Pulmonary Disease Chronic obstructive pulmonary disease (COPD) causes symptoms such as shortness of breath, coughing, and chest discomfort. These symptoms can make it difficult to eat enough to maintain a healthy weight. Generally, people with COPD should eat a diet that is high in calories, protein, and other nutrients to maintain body weight and to keep the lungs as healthy as possible. Depending on the medicines you take and other health conditions you may have, your health care provider may give you additional recommendations on what to eat or avoid. Talk with your health care provider about your goals for body weight, and work with a dietitian to develop an eating plan that is right for you. What are tips for following this plan? Reading food labels  Avoid foods with more than 300 milligrams (mg) of salt (sodium) per serving.  Choose foods that contain at least 4 grams (g) of fiber per serving. Try to eat 20-30 g of fiber each day.  Choose foods that are high in calories and protein, such as nuts, beans, yogurt, and cheese.   Shopping  Do not buy foods labeled as diet, low-calorie, or low-fat.  If you are able to eat dairy products: ? Avoid low-fat or skim milk. ? Buy dairy products that have at least 2% fat.  Buy nutritional supplement drinks.  Buy grains and prepared foods labeled as enriched or fortified.  Consider buying low-sodium, pre-made foods to conserve energy for eating. Cooking  Add dry milk or protein powder to smoothies.  Cook with healthy fats, such as olive oil, canola oil, sunflower oil, and grapeseed oil.  Add oil, butter, cream cheese, or nut butters to foods to increase fat and calories.  To make foods easier to chew and swallow: ? Cook vegetables, pasta, and rice until soft. ? Cut or grind meat into very small pieces. ? Dip breads in liquid. Meal planning  Eat when you feel hungry.  Eat 5-6 small meals throughout  the day.  Drink 6-8 glasses of water each day.  Do not drink liquids with meals. Drink liquids at the end of the meal to avoid feeling full too quickly.  Eat a variety of fruits and vegetables every day.  Ask for assistance from family or friends with planning and preparing meals as needed.  Avoid foods that cause you to feel bloated, such as carbonated drinks, fried foods, beans, broccoli, cabbage, and apples.  For older adults, ask your local agency on aging whether you are eligible for meal assistance programs, such as Meals on Wheels.   Lifestyle  Do not smoke.  Eat slowly. Take small bites and chew food well before swallowing.  Do not overeat. This may make it more difficult to breathe after eating.  Sit up while eating.  If needed, continue to use supplemental oxygen while eating.  Rest or relax for 30 minutes before and after eating.  Monitor your weight as told by your health care provider.  Exercise as told by your health care provider.   What foods should I eat? Fruits All fresh, dried, canned, or frozen fruits that do not cause gas. Vegetables All fresh, canned (no salt added), or frozen vegetables that do not cause gas. Grains Whole-grain bread. Enriched whole-grain pasta. Fortified whole-grain cereals. Fortified rice. Quinoa. Meats and other proteins Lean meat. Poultry. Fish. Dried beans. Unsalted nuts. Tofu. Eggs. Nut butters. Dairy Whole or 2% milk. Cheese. Yogurt. Fats and oils Olive oil. Canola oil. Butter. Margarine. Beverages Water. Vegetable   juice (no salt added). Decaffeinated coffee. Decaffeinated or herbal tea. Seasonings and condiments Fresh or dried herbs. Low-salt or salt-free seasonings. Low-sodium soy sauce. The items listed above may not be a complete list of foods and beverages you can eat. Contact a dietitian for more information. What foods should I avoid? Fruits Fruits that cause gas, such as apples or melon. Vegetables Vegetables  that cause gas, such as broccoli, Brussels sprouts, cabbage, cauliflower, and onions. Canned vegetables with added salt. Meats and other proteins Fried meat. Salt-cured meat. Processed meat. Dairy Fat-free or low-fat milk, yogurt, or cheese. Processed cheese. Beverages Carbonated drinks. Caffeinated drinks, such as coffee, tea, and soft drinks. Juice. Alcohol. Vegetable juice with added salt. Seasonings and condiments Salt. Seasoning mixes with salt. Soy sauce. Pickles. Other foods Clear soup or broth. Fried foods. Prepared frozen meals. The items listed above may not be a complete list of foods and beverages you should avoid. Contact a dietitian for more information. Summary  COPD symptoms can make it difficult to eat enough to maintain a healthy weight.  A COPD eating plan can help you maintain your body weight and keep your lungs as healthy as possible.  Eat a diet that is high in calories, protein, and other nutrients. Read labels to make sure that you are getting the right nutrients. Cook foods to make them easier to chew and swallow.  Eat 5-6 small meals throughout the day, and avoid foods that cause gas or make you feel bloated. This information is not intended to replace advice given to you by your health care provider. Make sure you discuss any questions you have with your health care provider. Document Revised: 11/13/2019 Document Reviewed: 11/13/2019 Elsevier Patient Education  2021 [employer]vier Inc.  

## 2020-04-28 NOTE — Assessment & Plan Note (Signed)
Ongoing, currently undergoing MOS procedures.  Continue to collaborate with dermatology.

## 2020-04-28 NOTE — Assessment & Plan Note (Signed)
Chronic, ongoing.  Denies SI/HI.  Will continue current palliative regimen.  Patient aware of long term risks of benzos, wishes to continue at this time. Refills sent in.  Continue to collaborate with palliative team.  Return in 4 months.

## 2020-04-28 NOTE — Assessment & Plan Note (Signed)
Ongoing.  Continue collaboration with oncology & ENT, appreciate their input.  Recent notes reviewed.

## 2020-04-28 NOTE — Assessment & Plan Note (Signed)
O2 dependent severe COPD.  Recommended use of O2 24 hours a day as recommended, at 2 L.  Continue to collaborate with pulmonology team.

## 2020-04-30 DIAGNOSIS — C329 Malignant neoplasm of larynx, unspecified: Secondary | ICD-10-CM | POA: Diagnosis not present

## 2020-04-30 DIAGNOSIS — R1310 Dysphagia, unspecified: Secondary | ICD-10-CM | POA: Diagnosis not present

## 2020-05-01 ENCOUNTER — Encounter: Payer: Self-pay | Admitting: Pulmonary Disease

## 2020-05-01 ENCOUNTER — Other Ambulatory Visit: Payer: Self-pay

## 2020-05-01 ENCOUNTER — Ambulatory Visit (INDEPENDENT_AMBULATORY_CARE_PROVIDER_SITE_OTHER): Payer: Medicare Other | Admitting: Pulmonary Disease

## 2020-05-01 VITALS — BP 128/72 | HR 83 | Temp 97.1°F | Ht 65.0 in | Wt 100.0 lb

## 2020-05-01 DIAGNOSIS — J9611 Chronic respiratory failure with hypoxia: Secondary | ICD-10-CM

## 2020-05-01 DIAGNOSIS — F17210 Nicotine dependence, cigarettes, uncomplicated: Secondary | ICD-10-CM | POA: Diagnosis not present

## 2020-05-01 DIAGNOSIS — C329 Malignant neoplasm of larynx, unspecified: Secondary | ICD-10-CM

## 2020-05-01 DIAGNOSIS — R0602 Shortness of breath: Secondary | ICD-10-CM | POA: Diagnosis not present

## 2020-05-01 DIAGNOSIS — J449 Chronic obstructive pulmonary disease, unspecified: Secondary | ICD-10-CM | POA: Diagnosis not present

## 2020-05-01 NOTE — Patient Instructions (Addendum)
We have sent a prescription to [facility] for an ultrasonic/portable nebulizer that will help you stay more mobile.  Use your nebulizer 4 times a day.  The medication that you will use is ipratropium-albuterol solution (DuoNeb) 4 times a day.  This will get into your lungs deeper.  You can use albuterol as needed in between DuoNeb doses if you are excessively short of breath or wheezing.  We will see you back in 4 to 6 weeks time.

## 2020-05-01 NOTE — Progress Notes (Signed)
Subjective:    Patient ID: Kevin Nelson, male    DOB: 09-30-1950, 70 y.o.   MRN: [id number]  Requesting MD/Service:[name] Ned Card, NP Date of initial consultation:01/25/18, by Dr. Merton Border Reason for consultation:Very severe COPD, smoker  PT PROFILE: 70 y.o.malesmoker (started age 89, maximum 2 PPD) recently moved to the area with history of oxygen dependent COPDfollowing herefor COPD/pulmonary issues  DATA: 02/03/18 6MWT:Submaximal effort. Test terminated after patient reached SPO2 84%. 02/14/18 PFTs:FVC: 2.28 L (56 %pred), FEV1: 0.80 L (25 %pred), FEV1/FVC: 35%, TLC: 7.77 L (121 %pred), DLCO 45 %pred. Flow volume curve consistent with very severe obstruction. No significant change after bronchodilator challenge. 04/14/18 CTA chest:Emphysema with bronchial thickening. Retained mucus in the dependent trachea.Prominent bilateral hilar lymph nodes are likely reactive. 5 mm nodular focus in the dependent left lower lobe is indeterminate for nodule versus atelectasis. 06/19/18 LDCT chest:Lung-RADS 2, benign appearance or behavior.LLL nodules stable.Continue annual screening with low-dose chest CT without contrast in 67months. 07/19/2019 PET/CT: Moderate metabolic activity LEFT lower lobe with rounded focus of atelectasis 5 cm in diameter.  Resolution of the hypermetabolic activity in the larynx and LEFT lymph node.  Possible upper abdominal hypermetabolic activity in the second portion of the duodenum 01/16/2020 chest CT: Extensive emphysema and hyperinflation, previously noted areas of rounded atelectasis have resolved.  No mediastinal adenopathy.  Inflammatory changes with minimal atelectasis left on the left lower lobe at the area of previously noted atelectasis 02/01/2019 2D echo: LVEF 50%, low normal function.  Grade I DD, RV systolic function mildly reduced  HPI This is a 70 year old current smoker (8 cigarettes/day) with very severe stage IV COPD by GOLD criteria who  presents for follow-up.  This is a scheduled appointment.  He has chronic respiratory failure with hypoxia and on chronic oxygen supplementation at 2 L/min.  He presents today with his girlfriend.  Is to be again angst about his medications.  Previously we had switched him to DuoNeb 4 times a day and as needed albuterol.  This was done due to the fact that he has no breath-holding capacity and it seemed to serve him better.  In addition this decrease the duplication of antimuscarinic agents which she was doing previously with Stiolto and Combivent.  On his prior visit of 13 January he stated that he "loved his nebulizer".  Since then he has gone back to using Stiolto that has been provided by "a friend".  His significant other is terribly confused about his medications even when these have been explained to her on multiple occasions.  She still seems to be confused and in turn confuses the patient with the same.  He has not used his nebulizers since his last visit as he has reverted to using Stiolto and appears to have a Combivent inhaler that has not been prescribed by me.  I have tried to explain to him that duplication of similar agents but he does not seem to comprehend this.  The patient's girlfriend states that he was doing better on Stiolto and Combivent yet currently today he complains that he is severely short of breath.  I have tried to explain that his lung function is less than 20% of predicted and that that he is at end-stage of his disease.  He continues to smoke approximately 8 cigarettes/day he also continues to try to frequent bars and is exposed to secondhand smoke.  He wonders if he can get off of oxygen.  He has severe desaturations with activity.  He requires 2  to 3-1/2 L of oxygen via nasal cannula to maintain adequate oxygen saturations.  At rest today he was 92% saturated on 2 L/min O2.  He is fixated today and that he needs to ride his motorcycle again.  I have advised against it due to  his overall debility and severe end-stage lung disease.  He requested a handicap sticker today and paperwork was filled out for him.  He has not had any recent fevers, chills or sweats.  Cough is unchanged in character and sputum production.  No hemoptysis.  Dyspnea class IV, no change.   Review of Systems A 10 point review of systems was performed and it is as noted above otherwise negative.  Patient Active Problem List   Diagnosis Date Noted  . Poor dentition 04/28/2020  . B12 deficiency 11/29/2019  . Goals of care, counseling/discussion 01/25/2019  . Squamous cell carcinoma of larynx ([place]) 01/04/2019  . Chronic nonintractable headache 11/10/2018  . Sensorineural hearing loss (SNHL) 08/21/2018  . Aortic atherosclerosis ([place]) 07/31/2018  . Chronic hepatitis ([place]) 04/21/2018  . Lung nodule < 6cm on CT 04/19/2018  . Senile purpura ([place]) 04/19/2018  . Protein-calorie malnutrition ([place]) 04/19/2018  . Nicotine dependence, cigarettes, uncomplicated 23/53/6144  . Controlled substance agreement signed 01/27/2018  . Chronic hypoxemic respiratory failure ([place]) 01/26/2018  . COPD, very severe ([place]) 01/13/2018  . Prostate cancer ([place]) 01/13/2018  . History of basal cell carcinoma (BCC) 01/13/2018  . Generalized anxiety disorder 01/13/2018   Social History   Tobacco Use  . Smoking status: Current Every Day Smoker    Packs/day: 0.50    Years: 52.00    Pack years: 26.00    Types: Cigarettes  . Smokeless tobacco: Never Used  . Tobacco comment: 6-7 a day  05/01/2020  Substance Use Topics  . Alcohol use: Not Currently    Alcohol/week: 3.0 standard drinks    Types: 3 Cans of beer per week   No Known Allergies   Current Meds  Medication Sig  . albuterol (PROVENTIL) (2.5 MG/3ML) 0.083% nebulizer solution Take 3 mLs (2.5 mg total) by nebulization every 6 (six) hours as needed for wheezing or shortness of breath.  [name] albuterol (VENTOLIN HFA) 108 (90 Base) MCG/ACT inhaler INHALE 2 PUFFS  INTO THE LUNGS EVERY 6 HOURS AS NEEDED FOR WHEEZING OR SHORTNESS OF BREATH  . clonazePAM (KLONOPIN) 1 MG tablet TAKE 1 TABLET(1 MG) BY MOUTH TWICE DAILY AS NEEDED FOR ANXIETY  . fluticasone (FLONASE) 50 MCG/ACT nasal spray SHAKE LIQUID AND USE 1 SPRAY IN EACH NOSTRIL TWICE DAILY  . lidocaine-prilocaine (EMLA) cream Apply topically once.  . naloxegol oxalate (MOVANTIK) 25 MG TABS tablet Take 1 tablet (25 mg total) by mouth daily.  . OXYGEN Inhale 3 L into the lungs daily.  . traMADol (ULTRAM) 50 MG tablet Take 25 to 50 MG (1/2 to full tablet) by mouth every 8 hours as needed ONLY for SEVERE PAIN and post-op procedures.  Use sparingly and do not take at same time as Klonopin.  [name] triamcinolone (KENALOG) 0.1 % Apply topically.   Immunization History  Administered Date(s) Administered  . IPV 08/26/1954, 09/30/1954  . Influenza-Unspecified 10/18/2017  . Moderna Sars-Covid-2 Vaccination 08/13/2019, 09/13/2019       Objective:   Physical Exam BP 128/72 (BP Location: Left Arm, Patient Position: Sitting, Cuff Size: Normal)   Pulse 83   Temp (!) 97.1 F (36.2 C) (Temporal)   Ht 5\' 5"  (1.651 m)   Wt 100 lb (45.4 kg)  SpO2 92%   BMI 16.64 kg/m  GENERAL: Cachectic gentleman, chronically ill-appearing, with chronic use of accessories, no overt acute distress. Presents in transport chair.  Wearing O2 via nasal cannula. HEAD: Normocephalic, atraumatic.  EYES: Pupils equal, round, reactive to light. No scleral icterus.  MOUTH: Nose/mouth/throat not examined due to masking requirements for COVID 19. Known poor dentition. NECK: Supple. No thyromegaly. Trachea midline. No JVD. No adenopathy. PULMONARY: Distant breath sounds. No adventitious sounds, poor air entry. CARDIOVASCULAR: S1 and S2. Regular rate and rhythm. No rubs, murmurs or gallops heard. ABDOMEN: Scaphoid, nondistended. MUSCULOSKELETAL: No joint deformity, no clubbing, no edema.  NEUROLOGIC: No overt focal deficit. Speech is  fluent. SKIN: Intact,warm,dry. Limited exam, no rashes PSYCH: Mood and behavior normal.      Assessment & Plan:     ICD-10-CM   1. COPD, very severe ([place])  J44.9 AMB REFERRAL FOR DME   Reiterated appropriate medications Resume DuoNeb 4 times a day As needed albuterol  2. Shortness of breath  R06.02    Dyspnea class IV Due to very severe end-stage COPD  3. Chronic hypoxemic respiratory failure (HCC)  J96.11    Continue oxygen supplementation 2 L/min at rest 3-1/2 L with activity  4. Squamous cell carcinoma of larynx (HCC)  C32.9    This issue adds complexity to his management  5. Tobacco dependence due to cigarettes  F17.210    Persistent use Counseled regards discontinuation of smoking   Orders Placed This Encounter  Procedures  . AMB REFERRAL FOR DME    Referral Priority:   Routine    Referral Type:   Durable Medical Equipment Purchase    Number of Visits Requested:   1   We have procured a portable ultrasonic nebulizer for the patient to see if the more portable device will help him be compliant with his nebulization therapy.  Greater than 20 minutes of time were spent in explaining the medication regimen to the patient and his significant other.  Even with these explanations the significant other appeared to be befuddled.  We will see the patient in follow-up in 4 to 6 weeks time with either me or the nurse practitioner.  He is to contact us prior to that time should any new difficulties arise.  Total visit time 62 minutes.  Renold Don, MD [facility]   *This note was dictated using voice recognition software/Dragon.  Despite best efforts to proofread, errors can occur which can change the meaning.  Any change was purely unintentional.

## 2020-05-02 ENCOUNTER — Encounter: Payer: Self-pay | Admitting: Pulmonary Disease

## 2020-05-06 DIAGNOSIS — C44519 Basal cell carcinoma of skin of other part of trunk: Secondary | ICD-10-CM | POA: Diagnosis not present

## 2020-05-08 ENCOUNTER — Encounter: Payer: Self-pay | Admitting: Pulmonary Disease

## 2020-05-08 NOTE — Progress Notes (Signed)
Subjective:    Patient ID: Kevin Nelson, male    DOB: Feb 12, 1950, 70 y.o.   MRN: [id number]  Requesting MD/Service:[name] Ned Card, NP Date of initial consultation:01/25/18, by Dr. Merton Border Reason for consultation:Very severe COPD, smoker  PT PROFILE: 70y.o.malesmoker (started age 68, maximum 2 PPD) recently moved to the area with history of oxygen dependent COPDfollowing herefor COPD/pulmonary issues  DATA: 02/03/18 6MWT:Submaximal effort. Test terminated after patient reached SPO2 84%. 02/14/18 PFTs:FVC: 2.28 L (56 %pred), FEV1: 0.80 L (25 %pred), FEV1/FVC: 35%, TLC: 7.77 L (121 %pred), DLCO 45 %pred. Flow volume curve consistent with very severe obstruction. No significant change after bronchodilator challenge. 04/14/18 CTA chest:Emphysema with bronchial thickening. Retained mucus in the dependent trachea.Prominent bilateral hilar lymph nodes are likely reactive. 5 mm nodular focus in the dependent left lower lobe is indeterminate for nodule versus atelectasis. 06/19/18 LDCT chest:Lung-RADS 2, benign appearance or behavior.LLL nodules stable.Continue annual screening with low-dose chest CT without contrast in 82months. 07/19/2019 PET/CT:Moderate metabolic activity LEFT lower lobe with rounded focus of atelectasis 5 cm in diameter.Resolution of the hypermetabolic activity in the larynx and LEFT lymph node. Possible upper abdominal hypermetabolic activity in the second portion of the duodenum 01/16/2020 chest CT: Extensive emphysema and hyperinflation, previously noted areas of rounded atelectasis have resolved. No mediastinal adenopathy. Inflammatory changes with minimal atelectasis left on the left lower lobe at the area of previously noted atelectasis 02/01/2019 2D echo: LVEF 50%, low normal function.  Grade I DD, RV systolic function mildly reduced  HPI This is a 70 year old current smoker (8 cigarettes/day) with very severe stage IV COPD by GOLD criteria who  presents for follow-up.  This is a scheduled appointment.  He has chronic respiratory failure with hypoxia and on chronic oxygen supplementation at 2 L/min.  He presents today with his girlfriend.  As previous, the girlfriend is very confused about his medications and seems to confuse the patient further.  I have encouraged them to bring the medications with them she does not seem to understand what nebulizers he should be on. Previously we had switched him to DuoNeb 4 times a day and as needed albuterol.  This was done due to the fact that he has no breath-holding capacity and it seemed to better for him .  In addition this decrease the duplication of antimuscarinic agents which she was doing previously with Stiolto and Combivent.    Previously on 13 January he stated that he "loved his nebulizer".  Inexplicably Since then he has gone back to using Stiolto that has been provided by "a friend".  His significant other is terribly confused about his medications even when these have been explained to her on multiple occasions.  She still seems to be confused and in turn confuses the patient with the same.  On the visit of 01 May 2020 I advised him to go back to the [facility] and leave the [employer] and Combivent alone.  However, he has not used his nebulizers since his last visit as he has reverted to using Stiolto and appears to have a Combivent inhaler that has not been prescribed by me.  I have tried to explain to him that duplication of similar agents but he does not seem to comprehend this.  The patient's girlfriend states that he was doing better on Stiolto and Combivent yet, on his prior visit and  today he complains that he is severely short of breath.  I have tried to explain that his lung function is less than 20%  of predicted and that that he is at end-stage of his disease.  He seems to have poor insight into this.   He does complain today of a cough   He continues to smoke approximately 8 cigarettes/day  he also continues to try to frequent bars and is exposed to secondhand smoke.  Review of Systems A 10 point review of systems was performed and it is as noted above otherwise negative.  Patient Active Problem List   Diagnosis Date Noted  . B12 deficiency 11/29/2019  . Goals of care, counseling/discussion 01/25/2019  . Squamous cell carcinoma of larynx ([place]) 01/04/2019  . Chronic nonintractable headache 11/10/2018  . Sensorineural hearing loss (SNHL) 08/21/2018  . Aortic atherosclerosis ([place]) 07/31/2018  . Chronic hepatitis ([place]) 04/21/2018  . Lung nodule < 6cm on CT 04/19/2018  . Senile purpura ([place]) 04/19/2018  . Protein-calorie malnutrition ([place]) 04/19/2018  . Nicotine dependence, cigarettes, uncomplicated 56/38/7564  . Controlled substance agreement signed 01/27/2018  . Chronic hypoxemic respiratory failure ([place]) 01/26/2018  . COPD, very severe ([place]) 01/13/2018  . Prostate cancer ([place]) 01/13/2018  . History of basal cell carcinoma (BCC) 01/13/2018  . Generalized anxiety disorder 01/13/2018   Social History   Tobacco Use  . Smoking status: Current Every Day Smoker    Packs/day: 0.50    Years: 52.00    Pack years: 26.00    Types: Cigarettes  . Smokeless tobacco: Never Used  . Tobacco comment: 6-7 a day  05/01/2020  Substance Use Topics  . Alcohol use: Not Currently    Alcohol/week: 3.0 standard drinks    Types: 3 Cans of beer per week   No Known Allergies  Current medications reviewed.  Patient and his significant other seem to be very confused about her medications I spent another 15 minutes discussing the medications that he should be on.     Objective:   Physical Exam BP 102/62 (BP Location: Left Arm, Cuff Size: Normal)   Pulse 91   Temp 97.8 F (36.6 C) (Temporal)   Ht 5\' 8"  (1.727 m)   Wt 107 lb 12.8 oz (48.9 kg)   SpO2 95%   BMI 16.39 kg/m  GENERAL: Cachectic gentleman, chronically ill-appearing, with chronic use of accessories, no overt acute distress.  Presents in transport chair.  Wearing O2 via nasal cannula. HEAD: Normocephalic, atraumatic.  EYES: Pupils equal, round, reactive to light. No scleral icterus.  MOUTH: Nose/mouth/throat not examined due to masking requirements for COVID 19. Known poor dentition. NECK: Supple. No thyromegaly. Trachea midline. No JVD. No adenopathy. PULMONARY: Distant breath sounds. Some end expiratory wheezes noted.  CARDIOVASCULAR: S1 and S2. Regular rate and rhythm. No rubs, murmurs or gallops heard. ABDOMEN: Scaphoid, nondistended. MUSCULOSKELETAL: No joint deformity, no clubbing, no edema.  NEUROLOGIC: No overt focal deficit. Speech is fluent. SKIN: Intact,warm,dry. Limited exam, no rashes PSYCH: Mood and behavior normal.       Assessment & Plan:     ICD-10-CM   1. COPD, very severe ([place])  J44.9    I have advised the patient to use DuoNeb every 4 hours STOP STIOLTO and COMBIVENT(not prescribed by me) Follow-up in 4 to 6 weeks time he is to contact us pri  2. Shortness of breath  R06.02 CT CHEST WO CONTRAST    ECHOCARDIOGRAM COMPLETE   Query cardiac component Chest, 2D echo  3. Chronic hypoxemic respiratory failure (HCC)  J96.11    Continue oxygen supplementation as is  4. Cough  R05.9 CT CHEST WO CONTRAST  Likely due to mild exacerbation of COPD Steroid taper    Orders Placed This Encounter  Procedures  . CT CHEST WO CONTRAST    Standing Status:   Future    Number of Occurrences:   1    Standing Expiration Date:   12/31/2020    Scheduling Instructions:     Next available.    Order Specific Question:   Preferred imaging location?    Answer:   [facility]  . ECHOCARDIOGRAM COMPLETE    Standing Status:   Future    Number of Occurrences:   1    Standing Expiration Date:   07/01/2020    Order Specific Question:   Where should this test be performed    Answer:   [facility] [place]    Order Specific Question:   Perflutren DEFINITY (image enhancing agent) should be administered  unless hypersensitivity or allergy exist    Answer:   Administer Perflutren    Order Specific Question:   Reason for exam-Echo    Answer:   Dyspnea  786.09 / R06.00   Meds ordered this encounter  Medications  .  predniSONE (DELTASONE) 20 MG tablet    Sig: Take 2 tablets daily for 4 days then 1 tablet daily for 7 days then half tablet daily or as directed by MD    Dispense:  45 tablet    Refill:  1   Patient will be following 4 to 6 weeks time.  We have discussed this medications again in detail.  I have advised him to bring all of his medications with him on the next visit.  Renold Don, MD [facility]   *This note was dictated using voice recognition software/Dragon.  Despite best efforts to proofread, errors can occur which can change the meaning.  Any change was purely unintentional.

## 2020-05-20 ENCOUNTER — Telehealth: Payer: Self-pay | Admitting: Pulmonary Disease

## 2020-05-20 DIAGNOSIS — R1310 Dysphagia, unspecified: Secondary | ICD-10-CM | POA: Diagnosis not present

## 2020-05-20 NOTE — Telephone Encounter (Signed)
Received call from [place] with [employer], who stated that she has been not been able to reach patient in regards to duoneb refills.   Lm for patient.

## 2020-05-21 NOTE — Telephone Encounter (Signed)
Lm x2 for patient.  Will close encounter per office protocol.   

## 2020-05-23 ENCOUNTER — Ambulatory Visit (INDEPENDENT_AMBULATORY_CARE_PROVIDER_SITE_OTHER): Payer: Medicare Other | Admitting: Pharmacist

## 2020-05-23 ENCOUNTER — Telehealth: Payer: Self-pay | Admitting: *Deleted

## 2020-05-23 ENCOUNTER — Encounter: Payer: Self-pay | Admitting: Nurse Practitioner

## 2020-05-23 DIAGNOSIS — J449 Chronic obstructive pulmonary disease, unspecified: Secondary | ICD-10-CM | POA: Diagnosis not present

## 2020-05-23 DIAGNOSIS — K089 Disorder of teeth and supporting structures, unspecified: Secondary | ICD-10-CM

## 2020-05-23 DIAGNOSIS — F411 Generalized anxiety disorder: Secondary | ICD-10-CM

## 2020-05-23 NOTE — Telephone Encounter (Signed)
Letter printed and paper ready for him to pick up and take in

## 2020-05-23 NOTE — Telephone Encounter (Signed)
Left detailed message to inform patient that his paperwork for Jury Duty is completed and placed up front for pick up. Advised patient our office does close at 5pm and that he may pick the paperwork up Monday morning.

## 2020-05-23 NOTE — Progress Notes (Signed)
Chronic Care Management Pharmacy Note  05/23/2020 Name:  Kevin Nelson MRN:  [id number] DOB:  [birth year]  Subjective: Kevin Nelson is an 70 y.o. year old male who is a primary patient of Cannady, [name], NP.  The CCM team was consulted for assistance with disease management and care coordination needs.    Engaged with patient by telephone for follow up visit in response to provider referral for pharmacy case management and/or care coordination services.   Consent to Services:  The patient was given information about Chronic Care Management services, agreed to services, and gave verbal consent prior to initiation of services.  Please see initial visit note for detailed documentation.   Patient Care Team: [name], NP as PCP - General (Nurse Practitioner) Greg Cutter, LCSW as Social Worker (Licensed Clinical Social Worker) Hall Busing, Nobie Putnam, RN as Case Manager (General Practice) Vladimir Faster, [facility] (Pharmacist)  Recent office visits: 04/28/20-Cannady (PCP)- tramadol 25-50 mg q8h prn sever pain post-op, no labs, refused statin, use lubriderm daily  Recent consult visits: 5/3/220 Hazel Sams (Speech pathology)-soft diet, noliquid restrictions 05/07/20-Samuelson(DDS)-healing well, COE adjustment 05/06/20-Merritt(Dermatology) MOHS surgery for [facility] of chest/lower neck 05/01/20- Gonzales(pulmonology) COPD very severe, duoneb qid, o2 2l at reat, 3.5l with activity, portable nebulizer 04/30/20-Blumberg(radiology) Barium swallow, no aspiration, significant residues at all consistencies 04/15/20-Samuelson(DDS) chlorhexidine, amoxil 500 mg tid x 14 d, alveoloplasty without extractions 03/26/20-Samuelson (DDS)- addition to prep surface for denture 03/18/20- Merritt( Dermatology)-MOHS procedure 03/04/20- Olena Heckle (Palliative)- request to go back to [facility] visits: None in previous 6 months  Objective:  Lab Results  Component Value Date   CREATININE 0.57 (L) 01/28/2020   BUN 9  01/28/2020   GFRNONAA 106 01/28/2020   GFRAA 123 01/28/2020   NA 141 01/28/2020   K 5.0 01/28/2020   CALCIUM 8.9 01/28/2020   CO2 34 (H) 01/28/2020    Lab Results  Component Value Date/Time   HGBA1C 5.1 01/28/2020 11:09 AM   HGBA1C 5.0 11/29/2019 09:53 AM    Last diabetic Eye exam: No results found for: HMDIABEYEEXA  Last diabetic Foot exam: No results found for: HMDIABFOOTEX   Lab Results  Component Value Date   CHOL 207 (H) 01/28/2020   HDL 92 01/28/2020   LDLCALC 97 01/28/2020   TRIG 105 01/28/2020    Hepatic Function Latest Ref Rng & Units 12/21/2019 11/29/2019 08/20/2019  Total Protein 6.5 - 8.1 g/dL 7.3 6.4 6.7  Albumin 3.5 - 5.0 g/dL 4.4 4.1 3.7  AST 15 - 41 U/L '22 16 18  ' ALT 0 - 44 U/L '13 9 11  ' Alk Phosphatase 38 - 126 U/L 49 66 56  Total Bilirubin 0.3 - 1.2 mg/dL 0.9 0.5 0.6  Bilirubin, Direct 0.0 - 0.2 mg/dL - - -    Lab Results  Component Value Date/Time   TSH 0.864 01/28/2020 11:09 AM   TSH 0.914 11/29/2019 10:34 AM    CBC Latest Ref Rng & Units 01/28/2020 12/21/2019 11/29/2019  WBC 3.4 - 10.8 x10E3/uL 8.7 7.1 4.7  Hemoglobin 13.0 - 17.7 g/dL 12.2(L) 12.2(L) 11.9(L)  Hematocrit 37.5 - 51.0 % 38.4 39.0 36.5(L)  Platelets 150 - 450 x10E3/uL 225 245 302    No results found for: VD25OH  Clinical ASCVD: Yes  The ASCVD Risk score Kevin Bussing DC Jr., et al., 2013) failed to calculate for the following reasons:   The patient has a prior MI or stroke diagnosis    Depression screen [facility] 2/9 04/28/2020 02/01/2020 01/28/2020  Decreased Interest 0 0  0  Down, Depressed, Hopeless 0 0 0  PHQ - 2 Score 0 0 0  Altered sleeping 0 - 0  Tired, decreased energy 0 - 1  Change in appetite 0 - 1  Feeling bad or failure about yourself  0 - 0  Trouble concentrating 0 - 0  Moving slowly or fidgety/restless 0 - 0  Suicidal thoughts 0 - 0  PHQ-9 Score 0 - 2  Difficult doing work/chores Not difficult at all - Not difficult at all  Some recent data might be hidden     No flowsheet  data found.   Social History   Tobacco Use  Smoking Status Current Every Day Smoker  . Packs/day: 0.50  . Years: 52.00  . Pack years: 26.00  . Types: Cigarettes  Smokeless Tobacco Never Used  Tobacco Comment   6-7 a day  05/01/2020   BP Readings from Last 3 Encounters:  05/01/20 128/72  04/28/20 (!) 131/58  01/31/20 100/60   Pulse Readings from Last 3 Encounters:  05/01/20 83  04/28/20 81  01/31/20 94   Wt Readings from Last 3 Encounters:  05/01/20 100 lb (45.4 kg)  04/28/20 100 lb 6.4 oz (45.5 kg)  02/01/20 100 lb (45.4 kg)    Assessment/Interventions: Review of patient past medical history, allergies, medications, health status, including review of consultants reports, laboratory and other test data, was performed as part of comprehensive evaluation and provision of chronic care management services.   SDOH:  (Social Determinants of Health) assessments and interventions performed: No    Immunization History  Administered Date(s) Administered  . IPV 08/26/1954, 09/30/1954  . Influenza-Unspecified 10/18/2017  . Moderna Sars-Covid-2 Vaccination 08/13/2019, 09/13/2019    Conditions to be addressed/monitored:  COPD, Tobacco use and anxiety  Care Plan : [place]  Updates made by Vladimir Faster, [place] since 05/23/2020 12:00 AM    Problem: COPD, Acute onn chronic pain, CAD, GAD   Priority: High  Onset Date: 03/31/2020    Long-Range Goal: Disease management   Start Date: 03/31/2020  This Visit's Progress: On track  Recent Progress: Not on track  Priority: High  Note:   Current Barriers:  . Unable to independently monitor therapeutic efficacy . Unable to maintain control of COPD . Unable to self administer medications as prescribed . Does not adhere to prescribed medication regimen    Pharmacist Clinical Goal(s):  [name] Over the next 90 days, patient will verbalize ability to afford treatment regimen . achieve adherence to monitoring guidelines and  medication adherence to achieve therapeutic efficacy . achieve control of COPD as evidenced by patient interview . adhere to plan to optimize therapeutic regimen for COPD as evidenced by report of adherence to recommended medication management changes through collaboration with PharmD and provider.    Interventions: . 1:1 collaboration with [name], NP regarding development and update of comprehensive plan of care as evidenced by provider attestation and co-signature . Inter-disciplinary care team collaboration (see longitudinal plan of care) . Comprehensive medication review performed; medication list updated in electronic medical record  COPD (Goal: control symptoms and prevent exacerbations) -Not ideally controlled -Current treatment  . Duoneb 1 nebule bid (prescribed qid by pulmonology) . Albuterol nebules prn . Albuterol MDI prn . Oxygen 2l/min at rest, 3.5L/min with activity -Medications previously tried: Stiolto  -Gold Grade: Gold 4 (FEV1<40%) -Current COPD Classification:  D (high sx, >/=2 exacerbations/yr) -Pulmonary function testing:  FVC: 2.28 L (56 %pred), FEV1: 0.80 L (25 %pred), FEV1/FVC: 35%,  TLC: 7.77 L (121 %pred), DLCO 45 %pred.  Flow volume curve consistent with very severe  Patient follow with Pulmonolgy -Patient denies consistent use of maintenance inhaler 05/23/20 Patient has portable nebulizer which can be plugged in the car and allows him much more freedom. He reports adherence to qid duoneb. -Frequency of rescue inhaler use: 1-2 times daily -Counseled on Differences between maintenance and rescue inhalers -Recommended to continue current medication Counseled on benefit on nebulizer given his significantly reduced lung funtion -5/6/22ollaborated with [employer] [phone] to verify nebules will be delivered to address of residence. Staying with Fiance currenlty at [place]. Patent notified.   Depression/Anxiety (Goal: redused  symptoms) -Not ideally controlled -Current treatment: . Clonazepam 1 mg bid prn -Medications previously tried/failed: NA -PHQ9:  PHQ9 SCORE ONLY 02/01/2020 01/28/2020 09/18/2019  PHQ-9 Total Score 0 2 3   Flowsheet Row Office Visit from 04/28/2020 in [facility]  PHQ-9 Total Score 0       GAD 7 : Generalized Anxiety Score 04/28/2020 01/28/2020 09/18/2019 06/05/2019  Nervous, Anxious, on Edge '1 1 1 3  ' Control/stop worrying 1 1 0 2  Worry too much - different things '1 1 1 2  ' Trouble relaxing 1 1 0 2  Restless 1 1 0 2  Easily annoyed or irritable 0 0 1 2  Afraid - awful might happen 0 0 1 2  Total GAD 7 Score '5 5 4 15  ' Anxiety Difficulty Not difficult at all Not difficult at all Not difficult at all Somewhat difficult    -Connected with LCSW for mental health support -Educated on Benefits of cognitive-behavioral therapy with or without medication -Recommended to continue current medication Recommended patient consider referral to pain management given anxiety surroundin ongoing dental pain and inabilty to get further opioids Collaborated with PCP for pain management referral 05/23/20- Patient recently had alveoloplasty which has healed well and he is scheduled for bottom denture in the next couple of weeks. He is much better spirits and experiencing significantly less pain and anxiety.  Tobacco use (Goal cessation) -Not ideally controlled -Previous quit attempts: NA -Current treatment  . NONE -Patient smokes Within 30 minutes of waking -Patient triggers include: stress and anxiety  -On a scale of 1-10, reports MOTIVATION to quit is 0 -On a scale of 1-10, reports CONFIDENCE in quitting is 0 -Provided contact information for [place] Quit Line (1-800-QUIT-NOW) and encouraged patient to reach out to this group for support. -Recommended patient cut back on cigarettes   Patient Goals/Self-Care Activities . Over the next 90 days, patient will:  - take medications as  prescribed focus on medication adherence by fill dates collaborate with provider on medication access solutions  Follow Up Plan: Telephone follow up appointment with care management team member scheduled for: 3 months PharmD, 1 month CPA        Medication Assistance: None required.  Patient affirms current coverage meets needs.  Patient's preferred pharmacy is:  [id number] [place], [place] - [address] AT [facility] [place] [address] Phone: [phone] Fax: [phone]  Uses pill box? No - fiancee handles medication Pt endorses 70%  compliance  We discussed: Benefits of medication synchronization, packaging and delivery as well as enhanced pharmacist oversight with [employer]. Patient decided to: Continue current medication management strategy  Care Plan and Follow Up Patient Decision:  Patient agrees to Care Plan and Follow-up.  Plan: Telephone follow up appointment with care management team member scheduled  for:  1 month [place]. Kenton Kingfisher PharmD, [place] [facility] [phone]

## 2020-05-23 NOTE — Patient Instructions (Addendum)
Visit Information  It was a pleasure speaking with you today. Thank you for letting me be part of your clinical team. Please call with any questions or concerns.   Goals Addressed              This Visit's Progress   .  COMPLETED: Pharm D "I want to take care of myself" (pt-stated)        Current Barriers:  . Polypharmacy; complex patient with multiple comorbidities including COPD, tobacco abuse, prostate cancer, s/p tx for Hep C; current tx for laryngeal cancer. Patient notes today significant frustration at having to wait until 10/29/19 to have his bottom teeth extracted. Notes he has teeth breaking off at the root and it makes it difficult to eat. He states he has been able to gain a little weight and credits his fiancee for taking great care of him. His PEG tube and portacath have been removed. He has completed chemo and radiation and PET scan showed no further evidence of malignancy. He is excellent spirits and states "I feel good."  . Palliative care team [name], NP involved. Last visit 10/09/19 . Notes that his finance, [name], has been helping manage his medications and diet at home  Squamous cell carcinoma of larynx: seen by [facility] pulm/ENT but following w/ [facility] currently - Pain: Oncologist has initiated three month taper of pain meds with plan to d/c at that time. Will discontinue oxycontin in one month. Current regimen oxycodone ER 10 mg BID, oxycodone IR 5mg  q8h prn. Patient reports he is doing well with reduced dose and denies symptoms of withdrawal. - Bowel regimen: senokot syrup PRN o COPD/tobacco abuse: Stiolto daily, Duonebs or Combivent as needed. Follows w/ Dr. Camillo Flaming pulmonary. Indicates he and fiancee had second COVID vaccine ~ three weeks ago and has cough, body aches angd lethargy. Reports a similar experience with the first dose. o S/p tx Hep C: s/p Mayvret. o Anxiety: clonazepam 1 mg BID; Citalopram 20 mg qd o B12 deficiency. Cyanocobalamin 1000 mcg  monthly. Last level 263 04/09/19 will repeat in December per Dr. Janese Banks  Pharmacist Clinical Goal(s):  [name] Over the next 90 days, patient will work with PharmD and provider towards optimized medication management  Interventions: . Comprehensive medication review performed, medication list updated in electronic medical record . Inter-disciplinary care team collaboration (see longitudinal plan of care)  Patient Self Care Activities:  . Patient will take medications as prescribed  Please see past updates related to this goal by clicking on the "Past Updates" button in the selected goal       .  COMPLETED: [place] (see longitudinal plan of care for additional care plan information)  Current Barriers:  . Chronic Disease Management support, education, and care coordination needs related to COPD, Anxiety, and Tobacco use h/o laryngeal cancer, prostate cancer, coronary atherosclerosis, B12 deficiency.   COPD/Tobacco Abuse . Pharmacist Clinical Goal(s) o Over the next 90  days, patient will work with PharmD and providers to minimize symptoms . Current regimen:  o Breztri 2 puffs bid o Albuterol prn o Duoneb q6 h prn . Interventions: o Encouraged smoking cessation o Reviewed MOA of medications o Reviewed importance of rinsing mouth after inhaler use . Patient self care activities - Over the next 90 days, patient will: o Attend all scheduled appointments o Take medication as prescribed  Depression/Anxiety o Pharmacist Clinical Goal(s) o Over the next 90 days,  patient will work with PharmD and providers to maximize therapy . Current regimen:  o Clonazepam 1 mg bid . Interventions: o Performed medication review and fill history . Patient self care activities - Over the next 90 days, patient will:  Take medication as prescribed Report any side effects to PCP  Medication management . Pharmacist Clinical Goal(s): o Over the next 90 days, patient will  work with PharmD and providers to maintain optimal medication adherence . Current pharmacy: [facility] . Interventions o Comprehensive medication review performed. o Continue current medication management strategy . Patient self care activities - Over the next 90 days, patient will: o Focus on medication adherence by fill dates o Take medications as prescribed o Report any questions or concerns to PharmD and/or provider(s)  Initial goal documentation     .  Track and Manage My Symptoms-COPD        Timeframe:  Short-Term Goal Priority:  High Start Date:                             Expected End Date:                       Follow Up Date  2 month follow up    - develop a rescue plan - eliminate symptom triggers at home - keep follow-up appointments     Why is this important?    Tracking your symptoms and other information about your health helps your doctor plan your care.   Write down the symptoms, the time of day, what you were doing and what medicine you are taking.   You will soon learn how to manage your symptoms.     Notes: 05/23/20 Patient now has portable nebulizer and reports compliance to QID duoneb.       The patient verbalized understanding of instructions, educational materials, and care plan provided today and declined offer to receive copy of patient instructions, educational materials, and care plan.   Telephone follow up appointment with pharmacy team member scheduled for: 1 month CPA, 3 months PharmD  Junita Push. Kenton Kingfisher PharmD, BCPS Clinical Pharmacist [phone]  Managing the Challenge of Quitting Smoking Quitting smoking is a physical and mental challenge. You will face cravings, withdrawal symptoms, and temptation. Before quitting, work with your health care provider to make a plan that can help you manage quitting. Preparation can help you quit and keep you from giving in. How to manage lifestyle changes Managing stress Stress can make you want to  smoke, and wanting to smoke may cause stress. It is important to find ways to manage your stress. You might try some of the following:  Practice relaxation techniques. ? Breathe slowly and deeply, in through your nose and out through your mouth. ? Listen to music. ? Soak in a bath or take a shower. ? Imagine a peaceful place or vacation.  Get some support. ? Talk with family or friends about your stress. ? Join a support group. ? Talk with a counselor or therapist.  Get some physical activity. ? Go for a walk, run, or bike ride. ? Play a favorite sport. ? Practice yoga.   Medicines Talk with your health care provider about medicines that might help you deal with cravings and make quitting easier for you. Relationships Social situations can be difficult when you are quitting smoking. To manage this, you can:  Avoid parties and other social situations where people  might be smoking.  Avoid alcohol.  Leave right away if you have the urge to smoke.  Explain to your family and friends that you are quitting smoking. Ask for support and let them know you might be a bit grumpy.  Plan activities where smoking is not an option. General instructions Be aware that many people gain weight after they quit smoking. However, not everyone does. To keep from gaining weight, have a plan in place before you quit and stick to the plan after you quit. Your plan should include:  Having healthy snacks. When you have a craving, it may help to: ? Eat popcorn, carrots, celery, or other cut vegetables. ? Chew sugar-free gum.  Changing how you eat. ? Eat small portion sizes at meals. ? Eat 4-6 small meals throughout the day instead of 1-2 large meals a day. ? Be mindful when you eat. Do not watch television or do other things that might distract you as you eat.  Exercising regularly. ? Make time to exercise each day. If you do not have time for a long workout, do short bouts of exercise for 5-10  minutes several times a day. ? Do some form of strengthening exercise, such as weight lifting. ? Do some exercise that gets your heart beating and causes you to breathe deeply, such as walking fast, running, swimming, or biking. This is very important.  Drinking plenty of water or other low-calorie or no-calorie drinks. Drink 6-8 glasses of water daily.   How to recognize withdrawal symptoms Your body and mind may experience discomfort as you try to get used to not having nicotine in your system. These effects are called withdrawal symptoms. They may include:  Feeling hungrier than normal.  Having trouble concentrating.  Feeling irritable or restless.  Having trouble sleeping.  Feeling depressed.  Craving a cigarette. To manage withdrawal symptoms:  Avoid places, people, and activities that trigger your cravings.  Remember why you want to quit.  Get plenty of sleep.  Avoid coffee and other caffeinated drinks. These may worsen some of your symptoms. These symptoms may surprise you. But be assured that they are normal to have when quitting smoking. How to manage cravings Come up with a plan for how to deal with your cravings. The plan should include the following:  A definition of the specific situation you want to deal with.  An alternative action you will take.  A clear idea for how this action will help.  The name of someone who might help you with this. Cravings usually last for 5-10 minutes. Consider taking the following actions to help you with your plan to deal with cravings:  Keep your mouth busy. ? Chew sugar-free gum. ? Suck on hard candies or a straw. ? Brush your teeth.  Keep your hands and body busy. ? Change to a different activity right away. ? Squeeze or play with a ball. ? Do an activity or a hobby, such as making bead jewelry, practicing needlepoint, or working with wood. ? Mix up your normal routine. ? Take a short exercise break. Go for a quick  walk or run up and down stairs.  Focus on doing something kind or helpful for someone else.  Call a friend or family member to talk during a craving.  Join a support group.  Contact a quitline. Where to find support To get help or find a support group:  Call the [facility]'s Smoking Quitline: 1-800-QUIT NOW 320-773-5901)  Visit the website  of the Substance Abuse and Mental Health Services Administration: ktimeonline.com  Text QUIT to SmokefreeTXT: 870-202-3506 Where to find more information Visit these websites to find more information on quitting smoking:  [place]: www.smokefree.gov  American Lung Association: www.lung.org  American Cancer Society: www.cancer.org  Centers for Disease Control and Prevention: http://www.wolf.info/  American Heart Association: www.heart.org Contact a health care provider if:  You want to change your plan for quitting.  The medicines you are taking are not helping.  Your eating feels out of control or you cannot sleep. Get help right away if:  You feel depressed or become very anxious. Summary  Quitting smoking is a physical and mental challenge. You will face cravings, withdrawal symptoms, and temptation to smoke again. Preparation can help you as you go through these challenges.  Try different techniques to manage stress, handle social situations, and prevent weight gain.  You can deal with cravings by keeping your mouth busy (such as by chewing gum), keeping your hands and body busy, calling family or friends, or contacting a quitline for people who want to quit smoking.  You can deal with withdrawal symptoms by avoiding places where people smoke, getting plenty of rest, and avoiding drinks with caffeine. This information is not intended to replace advice given to you by your health care provider. Make sure you discuss any questions you have with your health care provider. Document Revised: 10/24/2018 Document Reviewed:  10/24/2018 Elsevier Patient Education  [place].

## 2020-05-23 NOTE — Telephone Encounter (Signed)
Pt brought in [employer] form to be filled out by Dr. Ned Card please. Placed in [place] for review..the patient wants to be called when completed

## 2020-05-26 NOTE — Telephone Encounter (Signed)
Spoke with patient and he verbalized he has just left our office picking up his letter.

## 2020-05-28 ENCOUNTER — Other Ambulatory Visit: Payer: Self-pay | Admitting: Nurse Practitioner

## 2020-05-28 NOTE — Telephone Encounter (Signed)
Last apt on 04/28/2020 next apt on 08/28/2020

## 2020-05-28 NOTE — Telephone Encounter (Signed)
Last fill 04/28/20

## 2020-05-28 NOTE — Telephone Encounter (Signed)
Requested medication (s) are due for refill today:  yes   Requested medication (s) are on the active medication list: yes  Last refill:  04/28/2020  Future visit scheduled:yes  Notes to clinic:  this refill cannot be delegated   Requested Prescriptions  Pending Prescriptions Disp Refills   clonazePAM (KLONOPIN) 1 MG tablet [Pharmacy Med Name: CLONAZEPAM 1MG  TABLETS] 60 tablet     Sig: TAKE 1 TABLET(1 MG) BY MOUTH TWICE DAILY AS NEEDED FOR ANXIETY      Not Delegated - Psychiatry:  Anxiolytics/Hypnotics Failed - 05/28/2020 12:58 PM      Failed - This refill cannot be delegated      Passed - Urine Drug Screen completed in last 360 days      Passed - Valid encounter within last 6 months    Recent Outpatient Visits           1 month ago COPD, very severe ([place])   [place], [name] T, NP   4 months ago COPD, very severe ([place])   [place], [name] T, NP   6 months ago COPD, very severe ([place])   [place], [name] T, NP   8 months ago COPD, very severe ([place])   [place], [name] T, NP   11 months ago Squamous cell carcinoma of larynx ([place])   [place], [name], NP       Future Appointments             In 3 months Cannady, [name], NP [employer] [facility]   In 8 months  [facility]

## 2020-05-30 ENCOUNTER — Telehealth: Payer: Self-pay | Admitting: Adult Health Nurse Practitioner

## 2020-05-30 NOTE — Telephone Encounter (Signed)
Called patient's significant other, [name], and left message requesting a return call to possibly schedule a Palliative f/u visit with NP.  Left my name and call back number.

## 2020-06-05 DIAGNOSIS — L570 Actinic keratosis: Secondary | ICD-10-CM | POA: Diagnosis not present

## 2020-06-05 DIAGNOSIS — Z85828 Personal history of other malignant neoplasm of skin: Secondary | ICD-10-CM | POA: Diagnosis not present

## 2020-06-05 DIAGNOSIS — L821 Other seborrheic keratosis: Secondary | ICD-10-CM | POA: Diagnosis not present

## 2020-06-05 DIAGNOSIS — L814 Other melanin hyperpigmentation: Secondary | ICD-10-CM | POA: Diagnosis not present

## 2020-06-05 DIAGNOSIS — L578 Other skin changes due to chronic exposure to nonionizing radiation: Secondary | ICD-10-CM | POA: Diagnosis not present

## 2020-06-05 DIAGNOSIS — L923 Foreign body granuloma of the skin and subcutaneous tissue: Secondary | ICD-10-CM | POA: Diagnosis not present

## 2020-06-06 ENCOUNTER — Telehealth: Payer: Self-pay

## 2020-06-10 ENCOUNTER — Telehealth: Payer: Self-pay

## 2020-06-10 NOTE — Telephone Encounter (Signed)
  Care Management   Follow Up Note   06/10/2020 Name: Tanush Drees MRN: [id number] DOB: 07-12-1950   Referred by: [name], NP Reason for referral : Chronic Care Management (RNCM follow up for chronic disease management and care coordination needs)   An unsuccessful telephone outreach was attempted today. The patient was referred to the case management team for assistance with care management and care coordination.   Follow Up Plan: A HIPPA compliant phone message was left for the patient providing contact information and requesting a return call.   Salvatore Marvel RN, Agricultural consultant Health  [employer] planner Family Practice Mobile: [phone]

## 2020-06-16 NOTE — Progress Notes (Deleted)
@Patient  ID: Kevin Nelson, male    DOB: June 19, 1950, 70 y.o.   MRN: [id number]  No chief complaint on file.   Referring provider: [name], NP  HPI: 70 year old male, current smoker. PMH significant for severe COPD, chronic respiratory failure, squamous cell carcinoma of larynx, prostate cancer, generalized anxiety disorder, protein-calorie malnutrition. Patient of Dr. Patsey Berthold, last seen in office on 05/01/20.  Previous [facility] pulmonary encounter: 05/01/20  69 y.o.malesmoker (started age 70, maximum 2 PPD) recently moved to the area with history of oxygen dependent COPDfollowing herefor COPD/pulmonary issues  DATA: 02/03/18 6MWT:Submaximal effort. Test terminated after patient reached SPO2 84%. 02/14/18 PFTs:FVC: 2.28 L (56 %pred), FEV1: 0.80 L (25 %pred), FEV1/FVC: 35%, TLC: 7.77 L (121 %pred), DLCO 45 %pred. Flow volume curve consistent with very severe obstruction. No significant change after bronchodilator challenge. 04/14/18 CTA chest:Emphysema with bronchial thickening. Retained mucus in the dependent trachea.Prominent bilateral hilar lymph nodes are likely reactive. 5 mm nodular focus in the dependent left lower lobe is indeterminate for nodule versus atelectasis. 06/19/18 LDCT chest:Lung-RADS 2, benign appearance or behavior.LLL nodules stable.Continue annual screening with low-dose chest CT without contrast in 27months. 07/19/2019 PET/CT: Moderate metabolic activity LEFT lower lobe with rounded focus of atelectasis 5 cm in diameter.  Resolution of the hypermetabolic activity in the larynx and LEFT lymph node.  Possible upper abdominal hypermetabolic activity in the second portion of the duodenum 01/16/2020 chest CT: Extensive emphysema and hyperinflation, previously noted areas of rounded atelectasis have resolved.  No mediastinal adenopathy.  Inflammatory changes with minimal atelectasis left on the left lower lobe at the area of previously noted  atelectasis 02/01/2019 2D echo: LVEF 50%, low normal function.  Grade I DD, RV systolic function mildly reduced  HPI This is a 70 year old current smoker (8 cigarettes/day) with very severe stage IV COPD by GOLD criteria who presents for follow-up.  This is a scheduled appointment.  He has chronic respiratory failure with hypoxia and on chronic oxygen supplementation at 2 L/min.  He presents today with his girlfriend.  Is to be again angst about his medications.  Previously we had switched him to DuoNeb 4 times a day and as needed albuterol.  This was done due to the fact that he has no breath-holding capacity and it seemed to serve him better.  In addition this decrease the duplication of antimuscarinic agents which she was doing previously with Stiolto and Combivent.  On his prior visit of 13 January he stated that he "loved his nebulizer".  Since then he has gone back to using Stiolto that has been provided by "a friend".  His significant other is terribly confused about his medications even when these have been explained to her on multiple occasions.  She still seems to be confused and in turn confuses the patient with the same.  He has not used his nebulizers since his last visit as he has reverted to using Stiolto and appears to have a Combivent inhaler that has not been prescribed by me.  I have tried to explain to him that duplication of similar agents but he does not seem to comprehend this.  The patient's girlfriend states that he was doing better on Stiolto and Combivent yet currently today he complains that he is severely short of breath.  I have tried to explain that his lung function is less than 20% of predicted and that that he is at end-stage of his disease.  He continues to smoke approximately 8 cigarettes/day he also  continues to try to frequent bars and is exposed to secondhand smoke.  He wonders if he can get off of oxygen.  He has severe desaturations with activity.  He requires 2 to  3-1/2 L of oxygen via nasal cannula to maintain adequate oxygen saturations.  At rest today he was 92% saturated on 2 L/min O2.  He is fixated today and that he needs to ride his motorcycle again.  I have advised against it due to his overall debility and severe end-stage lung disease.  He requested a handicap sticker today and paperwork was filled out for him.  He has not had any recent fevers, chills or sweats.  Cough is unchanged in character and sputum production.  No hemoptysis.  Dyspnea class IV, no change.   06/17/2020- Interim hx  Patient presents today for 4-6 week follow-up.             No Known Allergies  Immunization History  Administered Date(s) Administered  . IPV 08/26/1954, 09/30/1954  . Influenza-Unspecified 10/18/2017  . Moderna Sars-Covid-2 Vaccination 08/13/2019, 09/13/2019    Past Medical History:  Diagnosis Date  . Anxiety   . Basal cell carcinoma   . Cigarette smoker   . Dental decay   . Dysphagia   . Dyspnea   . End stage COPD ([place])    end stage copd, emphysema  . Erectile dysfunction   . Headache    every morning  . Hepatitis C    treated for 8 weeks this past year with Mayvret  . History of nonmelanoma skin cancer   . Hoarseness, chronic   . Myocardial infarction [facility]) march/april 2020   mild heart attack  . Prostate cancer ([place])   . Respiratory failure, acute ([place])    was inpt for 5 days. thought he was going to die.  . Sleep apnea   . Squamous cell skin cancer   . Throat cancer ([place])   . Use of leuprolide acetate (Lupron)    treated for prostate cancer.   . 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    Tobacco History: Social History   Tobacco Use  Smoking Status Current Every Day Smoker  . Packs/day: 0.50  . Years: 52.00  . Pack years: 26.00  . Types: Cigarettes  Smokeless Tobacco Never Used  Tobacco Comment   6-7 a day  05/01/2020   Ready to quit: Not Answered Counseling given: Not Answered Comment: 6-7  a day  05/01/2020   Outpatient Medications Prior to Visit  Medication Sig Dispense Refill  . albuterol (PROVENTIL) (2.5 MG/3ML) 0.083% nebulizer solution Take 3 mLs (2.5 mg total) by nebulization every 6 (six) hours as needed for wheezing or shortness of breath. 120 mL 11  . albuterol (VENTOLIN HFA) 108 (90 Base) MCG/ACT inhaler INHALE 2 PUFFS INTO THE LUNGS EVERY 6 HOURS AS NEEDED FOR WHEEZING OR SHORTNESS OF BREATH 18 g 6  . clonazePAM (KLONOPIN) 1 MG tablet TAKE 1 TABLET(1 MG) BY MOUTH TWICE DAILY AS NEEDED FOR ANXIETY 60 tablet 2  . fluticasone (FLONASE) 50 MCG/ACT nasal spray SHAKE LIQUID AND USE 1 SPRAY IN EACH NOSTRIL TWICE DAILY 16 g 1  . ipratropium-albuterol (DUONEB) 0.5-2.5 (3) MG/3ML SOLN USE 3 ML VIA NEBULIZER EVERY 6 HOURS AS NEEDED 360 mL 4  . lidocaine-prilocaine (EMLA) cream Apply topically once.    . naloxegol oxalate (MOVANTIK) 25 MG TABS tablet Take 1 tablet (25 mg total) by mouth daily. 30 tablet 0  . OXYGEN  Inhale 3 L into the lungs daily.    . traMADol (ULTRAM) 50 MG tablet Take 25 to 50 MG (1/2 to full tablet) by mouth every 8 hours as needed ONLY for SEVERE PAIN and post-op procedures.  Use sparingly and do not take at same time as Klonopin. 15 tablet 0  . triamcinolone (KENALOG) 0.1 % Apply topically.     No facility-administered medications prior to visit.      Review of Systems  Review of Systems   Physical Exam  There were no vitals taken for this visit. Physical Exam   Lab Results:  CBC    Component Value Date/Time   WBC 8.7 01/28/2020 1109   WBC 7.1 12/21/2019 0923   RBC 4.07 (L) 01/28/2020 1109   RBC 4.24 12/21/2019 0923   HGB 12.2 (L) 01/28/2020 1109   HCT 38.4 01/28/2020 1109   PLT 225 01/28/2020 1109   MCV 94 01/28/2020 1109   MCH 30.0 01/28/2020 1109   MCH 28.8 12/21/2019 0923   MCHC 31.8 01/28/2020 1109   MCHC 31.3 12/21/2019 0923   RDW 13.8 01/28/2020 1109   LYMPHSABS 1.1 01/28/2020 1109   MONOABS 1.0 12/21/2019 0923   EOSABS 0.1  01/28/2020 1109   BASOSABS 0.1 01/28/2020 1109    BMET    Component Value Date/Time   NA 141 01/28/2020 1109   K 5.0 01/28/2020 1109   CL 96 01/28/2020 1109   CO2 34 (H) 01/28/2020 1109   GLUCOSE 98 01/28/2020 1109   GLUCOSE 125 (H) 12/21/2019 0923   BUN 9 01/28/2020 1109   CREATININE 0.57 (L) 01/28/2020 1109   CALCIUM 8.9 01/28/2020 1109   GFRNONAA 106 01/28/2020 1109   GFRNONAA >60 12/21/2019 0923   GFRAA 123 01/28/2020 1109    BNP    Component Value Date/Time   BNP 43.0 09/29/2018 1243    ProBNP No results found for: PROBNP  Imaging: No results found.   Assessment & Plan:   No problem-specific Assessment & Plan notes found for this encounter.     Martyn Ehrich, NP 06/16/2020

## 2020-06-17 ENCOUNTER — Ambulatory Visit: Payer: Medicare Other | Admitting: Primary Care

## 2020-06-25 ENCOUNTER — Telehealth: Payer: Self-pay | Admitting: Pharmacist

## 2020-06-25 NOTE — Chronic Care Management (AMB) (Signed)
    Chronic Care Management Pharmacy Assistant   Name: Kevin Nelson  MRN: [id number] DOB: 22-May-1950   Reason for Encounter: Disease State General adherence   Recent office visits:  None noted  Recent consult visits:  06/05/20-Puneet Marolyn Hammock, MD (Dermatology) Seen for skin check. Completed cryotherapy. Follow up in 3 months.  Hospital visits:  None in previous 6 months  Medications: Outpatient Encounter Medications as of 06/25/2020  Medication Sig  . albuterol (PROVENTIL) (2.5 MG/3ML) 0.083% nebulizer solution Take 3 mLs (2.5 mg total) by nebulization every 6 (six) hours as needed for wheezing or shortness of breath.  [name] albuterol (VENTOLIN HFA) 108 (90 Base) MCG/ACT inhaler INHALE 2 PUFFS INTO THE LUNGS EVERY 6 HOURS AS NEEDED FOR WHEEZING OR SHORTNESS OF BREATH  . clonazePAM (KLONOPIN) 1 MG tablet TAKE 1 TABLET(1 MG) BY MOUTH TWICE DAILY AS NEEDED FOR ANXIETY  . fluticasone (FLONASE) 50 MCG/ACT nasal spray SHAKE LIQUID AND USE 1 SPRAY IN EACH NOSTRIL TWICE DAILY  . ipratropium-albuterol (DUONEB) 0.5-2.5 (3) MG/3ML SOLN USE 3 ML VIA NEBULIZER EVERY 6 HOURS AS NEEDED  . lidocaine-prilocaine (EMLA) cream Apply topically once.  . naloxegol oxalate (MOVANTIK) 25 MG TABS tablet Take 1 tablet (25 mg total) by mouth daily.  . OXYGEN Inhale 3 L into the lungs daily.  . traMADol (ULTRAM) 50 MG tablet Take 25 to 50 MG (1/2 to full tablet) by mouth every 8 hours as needed ONLY for SEVERE PAIN and post-op procedures.  Use sparingly and do not take at same time as Klonopin.  [name] triamcinolone (KENALOG) 0.1 % Apply topically.   No facility-administered encounter medications on file as of 06/25/2020.   Have you had any problems recently with your health? Patient states he is in his final process to get his bottom of his teeth dentures molded soon and is having pain with his teeth, he has ran out of pain medication. Patient states he has been doing good with his COPD and has been using his portable  nebulizer and inhalers.  Have you had any problems with your pharmacy? Patient states he has no problems with his pharmacy.  What issues or side effects are you having with your medications? Patient states he has stomach pain but he states "it hurts all the time". Patient states he is considering to see a GI specialist soon.  What would you like me to pass along to Birdena Crandall, CPP for them to help you with?  Patient states there is nothing at this time.  What can we do to take care of you better? Patient states there is nothing at this time.    Star Rating Drugs: None noted    Corrie Mckusick, [place]

## 2020-06-26 ENCOUNTER — Telehealth: Payer: Self-pay | Admitting: Adult Health Nurse Practitioner

## 2020-06-26 ENCOUNTER — Telehealth: Payer: Self-pay

## 2020-06-26 NOTE — Telephone Encounter (Signed)
Called patient to check in with him and to see if he would like to schedule a Palliative f/u visit with NP, no answer - left message requesting a return call, left my name and call back number.  I also called patient's significant other, Kevin Nelson, with no answer - left message requesting a return call as well.

## 2020-06-30 ENCOUNTER — Telehealth: Payer: Self-pay | Admitting: Adult Health Nurse Practitioner

## 2020-06-30 DIAGNOSIS — R059 Cough, unspecified: Secondary | ICD-10-CM | POA: Diagnosis not present

## 2020-06-30 DIAGNOSIS — R058 Other specified cough: Secondary | ICD-10-CM | POA: Diagnosis not present

## 2020-06-30 DIAGNOSIS — J449 Chronic obstructive pulmonary disease, unspecified: Secondary | ICD-10-CM | POA: Diagnosis not present

## 2020-06-30 DIAGNOSIS — R509 Fever, unspecified: Secondary | ICD-10-CM | POA: Diagnosis not present

## 2020-06-30 DIAGNOSIS — J441 Chronic obstructive pulmonary disease with (acute) exacerbation: Secondary | ICD-10-CM | POA: Diagnosis not present

## 2020-06-30 NOTE — Telephone Encounter (Signed)
Returned call to patient to offer to schedule a Palliative f/u visit, patient stated that he was currently at Urgent Care and they are sending him to the hospital with Pneumonia.  I have notified the [facility] Liaison Team and the Palliative Team for f/u

## 2020-07-03 ENCOUNTER — Other Ambulatory Visit: Payer: Self-pay | Admitting: Pulmonary Disease

## 2020-07-03 ENCOUNTER — Telehealth: Payer: Self-pay | Admitting: Licensed Clinical Social Worker

## 2020-07-03 NOTE — Telephone Encounter (Signed)
    Clinical Social Work  Chronic Care Management   Phone Outreach    07/03/2020 Name: Kevin Nelson MRN: [id number] DOB: 1950-02-15  [name] is a 70 y.o. year old male who is a primary care patient of Cannady, [name], NP .   CCM LCSW reached out to patient today by phone to introduce self, assess needs and offer Care Management services and interventions.    Telephone outreach was unsuccessful  Plan:CCM LCSW will wait for return call. If no return call is received, Will route chart to Care Guide to see if patient would like to reschedule phone appointment   Review of patient status, including review of consultants reports, relevant laboratory and other test results, and collaboration with appropriate care team members and the patient's provider was performed as part of comprehensive patient evaluation and provision of care management services.    Christa See, MSW, [email or web address] Phone [phone] 7:36 AM

## 2020-07-04 ENCOUNTER — Telehealth: Payer: Self-pay | Admitting: Adult Health Nurse Practitioner

## 2020-07-04 NOTE — Telephone Encounter (Signed)
Spoke with patient and he was in agreement with scheduling a Palliative f/u visit, this was scheduled for 07/07/20 @ 9 AM.

## 2020-07-07 ENCOUNTER — Encounter: Payer: Self-pay | Admitting: Adult Health Nurse Practitioner

## 2020-07-07 ENCOUNTER — Other Ambulatory Visit: Payer: Medicare Other | Admitting: Adult Health Nurse Practitioner

## 2020-07-07 ENCOUNTER — Other Ambulatory Visit: Payer: Self-pay

## 2020-07-07 VITALS — HR 100 | Wt 98.6 lb

## 2020-07-07 DIAGNOSIS — C4491 Basal cell carcinoma of skin, unspecified: Secondary | ICD-10-CM

## 2020-07-07 DIAGNOSIS — J449 Chronic obstructive pulmonary disease, unspecified: Secondary | ICD-10-CM

## 2020-07-07 DIAGNOSIS — E43 Unspecified severe protein-calorie malnutrition: Secondary | ICD-10-CM | POA: Diagnosis not present

## 2020-07-07 DIAGNOSIS — Z515 Encounter for palliative care: Secondary | ICD-10-CM

## 2020-07-07 NOTE — Progress Notes (Signed)
[facility] Palliative Care Consult Note Telephone: [phone]  Fax: [phone]    Date of encounter: 07/07/20 PATIENT NAME: Kevin Nelson Kevin Nelson [place] [address]   [phone] (home)  DOB: [birth year] MRN: [id number] PRIMARY CARE PROVIDER:    [name], NP,  [place] [address] [phone]  REFERRING PROVIDER:   [name], NP [address] [place],  [place] [address] [phone]  RESPONSIBLE PARTY:    Contact Information     Name Relation Home Work Mobile   Kevin Nelson,Kevin Nelson Brother   [phone]   Kevin Nelson other   [phone]        I met face to face with patient and family in home/facility. Palliative Care was asked to follow this patient by consultation request of  Kevin Lick, NP to address advance care planning and complex medical decision making. This is a follow up visit.  Significant other, Kevin Nelson, present during visit today                                   ASSESSMENT AND PLAN / RECOMMENDATIONS:   Advance Care Planning/Goals of Care: Goals include to maximize quality of life and symptom management.  Discussed today code status.  Patient listed as DNR in Epic but wishes to have CPR attempted. CODE STATUS: full code  Symptom Management/Plan:  COPD: Continues O2 at 3L and takes scheduled duoneb treatments.  Worsening symptoms due to allergies.  Recently treated for sinus infection with antibiotic and prednisone taper.  Have encouraged daily use of allergy medicine to help with symptoms.  Recommended flonase and allegra.    PCM: Intake fluctuates as he is still having dental work done and is currently without bottom dentures. Eating mostly soft foods.  Unable to tolerate supplemental drinks and encouraged to continue eating soft foods while undergoing dental treatments.  Basal cell carcinoma:  multiple sites on skin.  Continue follow up and recommendations by  dermatology   Follow up Palliative Care Visit: Palliative care will continue to follow for complex medical decision making, advance care planning, and clarification of goals. Will have office call to schedule follow up appointment in 6-8 weeks.  Encouraged to call with any questions or concerns  I spent 60 minutes providing this consultation. More than 50% of the time in this consultation was spent in counseling and care coordination.   PPS: 60%  HOSPICE ELIGIBILITY/DIAGNOSIS: TBD  Chief Complaint: follow up palliative visit  HISTORY OF PRESENT ILLNESS:  Kevin Nelson is a 70 y.o. year old male  with severe stage 3 COPD, larynx cancer, prostate cancer, skin cancer, CAD, chronic hepatitis . Patient is having allergy symptoms of runny nose and congestion, with sinus pressure and headache.  This is exacerbating his COPD.  Recently treated with antibiotic and prednisone for sinus infection.  Has been taking flonase PRN.  Still undergoing dental work and currently does not have his bottom dentures.  He is eating what he can which is mostly soft foods.  He is unable to tolerate milk based supplemental drinks and does not like the clear ones.  Weight is around 98 pounds.  Down from 100 pounds in April of this year.  Patient has had several skin lesions removed due to basal cell carcinoma.  These are healed nicely without much scarring.  Rest of 10 ROS asked and negative except what is  stated in HPI.  History obtained from review of EMR and interview with family and Kevin Nelson.    PHYSICAL EXAM:   General: NAD, frail appearing, thin Eyes: sclera anicteric and noninjected with no discharge noted Cardiovascular: regular rate and rhythm Pulmonary: lung sounds diminished more on left than right; no abnormal breath sounds heard; normal respiratory effort Abdomen: soft, nontender, hypoactive bowel sounds Extremities: no edema, no joint deformities Skin: no rashes on exposed skin Neurological: A&O  x3   Thank you for the opportunity to participate in the care of Kevin Nelson.  The palliative care team will continue to follow. Please call our office at [phone] if we can be of additional assistance.   Kevin Nelson Kevin Downer, NP , DNP  This chart was dictated using voice recognition software.  Despite best efforts to proofread,  errors can occur which can change the documentation meaning.   COVID-19 PATIENT SCREENING TOOL Asked and negative response unless otherwise noted:   Have you had symptoms of covid, tested positive or been in contact with someone with symptoms/positive test in the past 5-10 days?

## 2020-07-08 ENCOUNTER — Other Ambulatory Visit: Payer: Self-pay | Admitting: Pulmonary Disease

## 2020-07-17 ENCOUNTER — Telehealth: Payer: Self-pay

## 2020-07-17 NOTE — Chronic Care Management (AMB) (Signed)
  Care Management   Note  07/17/2020 Name: Hazaiah Edgecombe MRN: [id number] DOB: [birth year]  Doris Mcgilvery is a 70 y.o. year old male who is a primary care patient of [name], NP and is actively engaged with the care management team. I reached out to [name] by phone today to assist with re-scheduling a follow up visit with the RN Case Manager Licensed Clinical Social Worker  Follow up plan: Patient declines further follow up and engagement by the care management team Licensed clinical SW and RN CM . Appropriate care team members and provider have been notified via electronic communication.   Noreene Larsson, [place], [place], [place] [address] Direct Dial: [phone] [email or web address] Website: [email or web address]

## 2020-07-17 NOTE — Telephone Encounter (Signed)
Patient declined rescheduling F/U

## 2020-07-18 DIAGNOSIS — Z20822 Contact with and (suspected) exposure to covid-19: Secondary | ICD-10-CM | POA: Diagnosis not present

## 2020-07-22 ENCOUNTER — Telehealth: Payer: Self-pay

## 2020-07-22 NOTE — Telephone Encounter (Signed)
Copied from [place] [phone]. Topic: General - Inquiry >> Jul 22, 2020  9:41 AM Greggory Keen D wrote: Reason for CRM: Pt called asking if [name] can refer him to Neurologist and gastro doctor.   CB#  [phone]

## 2020-07-22 NOTE — Telephone Encounter (Signed)
Scheduled 7/14

## 2020-07-31 ENCOUNTER — Other Ambulatory Visit: Payer: Self-pay

## 2020-07-31 ENCOUNTER — Encounter: Payer: Self-pay | Admitting: Nurse Practitioner

## 2020-07-31 ENCOUNTER — Ambulatory Visit (INDEPENDENT_AMBULATORY_CARE_PROVIDER_SITE_OTHER): Payer: Medicare Other | Admitting: Nurse Practitioner

## 2020-07-31 VITALS — BP 116/64 | HR 87 | Temp 98.6°F | Wt 99.6 lb

## 2020-07-31 DIAGNOSIS — K5901 Slow transit constipation: Secondary | ICD-10-CM | POA: Insufficient documentation

## 2020-07-31 DIAGNOSIS — K739 Chronic hepatitis, unspecified: Secondary | ICD-10-CM

## 2020-07-31 DIAGNOSIS — J449 Chronic obstructive pulmonary disease, unspecified: Secondary | ICD-10-CM | POA: Diagnosis not present

## 2020-07-31 DIAGNOSIS — G5793 Unspecified mononeuropathy of bilateral lower limbs: Secondary | ICD-10-CM | POA: Insufficient documentation

## 2020-07-31 DIAGNOSIS — E43 Unspecified severe protein-calorie malnutrition: Secondary | ICD-10-CM | POA: Diagnosis not present

## 2020-07-31 DIAGNOSIS — F17210 Nicotine dependence, cigarettes, uncomplicated: Secondary | ICD-10-CM | POA: Diagnosis not present

## 2020-07-31 MED ORDER — LUBIPROSTONE 8 MCG PO CAPS: 8.0000 ug | ORAL_CAPSULE | Freq: Every day | ORAL | 4 refills | Status: AC

## 2020-07-31 MED ORDER — GABAPENTIN 600 MG PO TABS: 300.0000 mg | ORAL_TABLET | Freq: Every day | ORAL | 4 refills | Status: DC

## 2020-07-31 NOTE — Assessment & Plan Note (Addendum)
Ongoing after cancer treatments.  Continue collaboration with oncology and palliative.  Recommend drinking supplements TID. Check labs next visit.

## 2020-07-31 NOTE — Assessment & Plan Note (Signed)
I have recommended complete cessation of tobacco use. I have discussed various options available for assistance with tobacco cessation including over the counter methods (Nicotine gum, patch and lozenges). We also discussed prescription options (Chantix, Nicotine Inhaler / Nasal Spray). The patient is not interested in pursuing any prescription tobacco cessation options at this time.  

## 2020-07-31 NOTE — Assessment & Plan Note (Signed)
Ongoing, suspect exacerbated after cancer treatment.  At this time he wishes to hold off on neurology referral and we will trial Gabapentin 300 MG at night to start.  If no improvement will adjust as needed and consider a neurology referral.  Educated on medication.  Return in 8 weeks.

## 2020-07-31 NOTE — Assessment & Plan Note (Signed)
Chronic, ongoing with O2 dependence.  Continue current medication regimen and collaboration with pulmonology.  Recommend complete smoking cessation, although not interested in this at this time.  CBC, TSH, CMP next visit.  Return in 8 weeks.

## 2020-07-31 NOTE — Progress Notes (Signed)
BP 116/64   Pulse 87   Temp 98.6 F (37 C) (Oral)   Wt 99 lb 9.6 oz (45.2 kg)   SpO2 99%   BMI 16.57 kg/m    Subjective:    Patient ID: Kevin Nelson, male    DOB: 12/16/1950, 70 y.o.   MRN: [id number]  HPI: Kevin Nelson is a 70 y.o. male  Chief Complaint  Patient presents with   Referral    Patient is here to discuss a Copywriter, advertising and Neurologist referral with provider. Patient is would like to discuss medication for constipation. Patient states he was taking the medication and had to stop. Patient states he may have to get the prescription from GI, and another reason he would like the referral.    NEUROPATHY He would like a referral to neurology as is having some neuropathy discomfort -- has history of fracture to left foot -- 3 toes.  Has been present x 2 years.  Feels grainy when put socks on. Neuropathy status: uncontrolled  Satisfied with current treatment?: no Location: feet Pain: no Severity: 5/10  Quality:  burning and pins and needles Frequency: intermittent Bilateral: yes Symmetric: yes Numbness: yes Decreased sensation: yes Weakness: no Context:fluctuating Alleviating factors: nothing Aggravating factors: unknown Treatments attempted: none  CONSTIPATION Is requesting GI referral due to ongoing constipation after CA treatment -- was taking Movantik during that time.  Would like to return to see Dr. Allen Norris, who treated his Hep C.  Reports his stools vary -- sometimes BM 3 days a week and sometimes once a week.  Has abdominal pain -- at times will use enema.  Has underlying COPD. Duration:months Frequency: intermittent Alleviating factors: nothing -- except Movantik Aggravating factors: unknown Status: fluctuating Treatments attempted:  OTC medications Fever: no Nausea: no Vomiting: no Weight loss: no Decreased appetite: no Diarrhea: no Constipation: yes Blood in stool: no Heartburn: no Jaundice: no Rash: no  Relevant past medical, surgical,  family and social history reviewed and updated as indicated. Interim medical history since our last visit reviewed. Allergies and medications reviewed and updated.  Review of Systems  Constitutional:  Negative for activity change, diaphoresis, fatigue and fever.  Respiratory:  Negative for cough, chest tightness, shortness of breath and wheezing.   Cardiovascular:  Negative for chest pain, palpitations and leg swelling.  Gastrointestinal:  Positive for constipation. Negative for abdominal distention, abdominal pain, blood in stool, diarrhea, nausea and vomiting.  Neurological:  Positive for numbness. Negative for dizziness, tremors, syncope, weakness, light-headedness and headaches.  Psychiatric/Behavioral: Negative.     Per HPI unless specifically indicated above     Objective:    BP 116/64   Pulse 87   Temp 98.6 F (37 C) (Oral)   Wt 99 lb 9.6 oz (45.2 kg)   SpO2 99%   BMI 16.57 kg/m   Wt Readings from Last 3 Encounters:  07/31/20 99 lb 9.6 oz (45.2 kg)  07/07/20 98 lb 9.6 oz (44.7 kg)  05/01/20 100 lb (45.4 kg)    Physical Exam Vitals and nursing note reviewed.  Constitutional:      General: He is awake. He is not in acute distress.    Appearance: He is well-developed, well-groomed and underweight. He is not ill-appearing or toxic-appearing.     Comments: Frail appearing.  HENT:     Head: Normocephalic and atraumatic.     Right Ear: Hearing normal. No drainage.     Left Ear: Hearing normal. No drainage.  Eyes:     General:  Lids are normal.        Right eye: No discharge.        Left eye: No discharge.     Conjunctiva/sclera: Conjunctivae normal.     Pupils: Pupils are equal, round, and reactive to light.  Neck:     Vascular: No carotid bruit.  Cardiovascular:     Rate and Rhythm: Normal rate and regular rhythm.     Pulses:          Dorsalis pedis pulses are 2+ on the right side and 2+ on the left side.       Posterior tibial pulses are 2+ on the right side and 2+  on the left side.     Heart sounds: Normal heart sounds, S1 normal and S2 normal. No murmur heard.   No gallop.  Pulmonary:     Effort: Pulmonary effort is normal. No accessory muscle usage or respiratory distress.     Breath sounds: Decreased breath sounds present.     Comments: Decreased breath sounds throughout, clear.  O2 on at 2L [place]. Abdominal:     General: Bowel sounds are normal. There is no distension.     Palpations: Abdomen is soft.     Tenderness: There is no abdominal tenderness.  Musculoskeletal:        General: Normal range of motion.     Cervical back: Normal range of motion and neck supple.     Right lower leg: No edema.     Left lower leg: No edema.     Right foot: Normal range of motion.     Left foot: Normal range of motion.  Feet:     Right foot:     Protective Sensation: 10 sites tested.  7 sites sensed.     Skin integrity: Skin integrity normal.     Toenail Condition: Right toenails are normal.     Left foot:     Protective Sensation: 10 sites tested.  7 sites sensed.     Skin integrity: Skin integrity normal.     Toenail Condition: Left toenails are normal.  Skin:    General: Skin is warm and dry.     Comments: Scattered small pale purple bruises bilateral upper extremities.  Neurological:     Mental Status: He is alert and oriented to person, place, and time.     Deep Tendon Reflexes: Reflexes are normal and symmetric.  Psychiatric:        Attention and Perception: Attention normal.        Mood and Affect: Mood normal.        Speech: Speech normal.        Behavior: Behavior normal. Behavior is cooperative.        Thought Content: Thought content normal.    Results for orders placed or performed during the hospital encounter of 02/05/20  Glucose, capillary  Result Value Ref Range   Glucose-Capillary 91 70 - 99 mg/dL      Assessment & Plan:   Problem List Items Addressed This Visit       Respiratory   COPD, very severe ([place])    Chronic,  ongoing with O2 dependence.  Continue current medication regimen and collaboration with pulmonology.  Recommend complete smoking cessation, although not interested in this at this time.  CBC, TSH, CMP next visit.  Return in 8 weeks.         Digestive   Chronic hepatitis ([place]) - Primary    Treated, chronic.  Wishes  to return to GI for follow-up, referral in place.       Relevant Orders   Ambulatory referral to Gastroenterology   Slow transit constipation    Ongoing since cancer treatment and opioid use.  Will trial Lubiprostone and referral to return to GI placed.  Recommend diet changes and monitoring.         Relevant Orders   Ambulatory referral to Gastroenterology     Nervous and Auditory   Neuropathy of both feet    Ongoing, suspect exacerbated after cancer treatment.  At this time he wishes to hold off on neurology referral and we will trial Gabapentin 300 MG at night to start.  If no improvement will adjust as needed and consider a neurology referral.  Educated on medication.  Return in 8 weeks.       Relevant Medications   gabapentin (NEURONTIN) 600 MG tablet     Other   Nicotine dependence, cigarettes, uncomplicated    I have recommended complete cessation of tobacco use. I have discussed various options available for assistance with tobacco cessation including over the counter methods (Nicotine gum, patch and lozenges). We also discussed prescription options (Chantix, Nicotine Inhaler / Nasal Spray). The patient is not interested in pursuing any prescription tobacco cessation options at this time.        Protein-calorie malnutrition ([place])    Ongoing after cancer treatments.  Continue collaboration with oncology and palliative.  Recommend drinking supplements TID. Check labs next visit.         Follow up plan: Return in about 8 weeks (around 09/25/2020) for Constipation and neuropathy.

## 2020-07-31 NOTE — Assessment & Plan Note (Signed)
Treated, chronic.  Wishes to return to GI for follow-up, referral in place.

## 2020-07-31 NOTE — Patient Instructions (Signed)

## 2020-07-31 NOTE — Assessment & Plan Note (Signed)
Ongoing since cancer treatment and opioid use.  Will trial Lubiprostone and referral to return to GI placed.  Recommend diet changes and monitoring.

## 2020-08-14 ENCOUNTER — Telehealth: Payer: Self-pay

## 2020-08-14 NOTE — Telephone Encounter (Signed)
Pt is aware jolene will put in the referral

## 2020-08-14 NOTE — Telephone Encounter (Signed)
Copied from [place] [id number]. Topic: General - Other >> Aug 14, 2020  9:42 AM Leward Quan A wrote: Reason for CRM: Patient called in to inform Marnee Guarneri that he is still waiting to hear from a Neurologist so he want to know about the referral that was discussed at his last visit. Please advise Ph# [phone]

## 2020-08-14 NOTE — Telephone Encounter (Signed)
Called pt to alert of [name]'s message no answer left vm

## 2020-08-14 NOTE — Addendum Note (Signed)
Addended by: Marnee Guarneri T on: 08/14/2020 10:40 AM   Modules accepted: Orders

## 2020-08-25 ENCOUNTER — Other Ambulatory Visit: Payer: Self-pay | Admitting: Nurse Practitioner

## 2020-08-25 NOTE — Telephone Encounter (Signed)
Requested medication (s) are due for refill today:  yes   Requested medication (s) are on the active medication list: yes   Last refill:  07/28/2020  Future visit scheduled: yes   Notes to clinic:  this refill cannot be delegated    Requested Prescriptions  Pending Prescriptions Disp Refills   clonazePAM (KLONOPIN) 1 MG tablet [Pharmacy Med Name: CLONAZEPAM '1MG'$  TABLETS] 60 tablet     Sig: TAKE 1 TABLET(1 MG) BY MOUTH TWICE DAILY AS NEEDED FOR ANXIETY      Not Delegated - Psychiatry:  Anxiolytics/Hypnotics Failed - 08/25/2020 11:35 AM      Failed - This refill cannot be delegated      Passed - Urine Drug Screen completed in last 360 days      Passed - Valid encounter within last 6 months    Recent Outpatient Visits           3 weeks ago Chronic hepatitis ([place])   [place], [place] T, NP   3 months ago COPD, very severe ([place])   [place], [name] T, NP   7 months ago COPD, very severe ([place])   [place], [name] T, NP   9 months ago COPD, very severe ([place])   [place], [name] T, NP   11 months ago COPD, very severe ([place])   [place], [name], NP       Future Appointments             In 1 month Cannady, [name], NP [employer], [place]   In 1 month Lucilla Lame, MD [place]   In 5 months  [employer], [facility]

## 2020-08-28 ENCOUNTER — Ambulatory Visit: Payer: Medicare Other | Admitting: Nurse Practitioner

## 2020-08-30 ENCOUNTER — Encounter: Payer: Self-pay | Admitting: Internal Medicine

## 2020-08-30 ENCOUNTER — Emergency Department: Payer: Medicare Other

## 2020-08-30 ENCOUNTER — Other Ambulatory Visit: Payer: Self-pay

## 2020-08-30 ENCOUNTER — Inpatient Hospital Stay
Admission: EM | Admit: 2020-08-30 | Discharge: 2020-09-02 | DRG: 190 | Disposition: A | Discharge status: Hospice | Payer: Medicare Other | Attending: Internal Medicine | Admitting: Internal Medicine

## 2020-08-30 DIAGNOSIS — E43 Unspecified severe protein-calorie malnutrition: Secondary | ICD-10-CM | POA: Diagnosis not present

## 2020-08-30 DIAGNOSIS — Z8521 Personal history of malignant neoplasm of larynx: Secondary | ICD-10-CM | POA: Diagnosis not present

## 2020-08-30 DIAGNOSIS — I252 Old myocardial infarction: Secondary | ICD-10-CM

## 2020-08-30 DIAGNOSIS — I2510 Atherosclerotic heart disease of native coronary artery without angina pectoris: Secondary | ICD-10-CM | POA: Diagnosis present

## 2020-08-30 DIAGNOSIS — J9621 Acute and chronic respiratory failure with hypoxia: Secondary | ICD-10-CM | POA: Diagnosis present

## 2020-08-30 DIAGNOSIS — R627 Adult failure to thrive: Secondary | ICD-10-CM | POA: Diagnosis present

## 2020-08-30 DIAGNOSIS — J439 Emphysema, unspecified: Secondary | ICD-10-CM | POA: Diagnosis present

## 2020-08-30 DIAGNOSIS — F419 Anxiety disorder, unspecified: Secondary | ICD-10-CM | POA: Diagnosis present

## 2020-08-30 DIAGNOSIS — R0602 Shortness of breath: Secondary | ICD-10-CM | POA: Diagnosis not present

## 2020-08-30 DIAGNOSIS — G629 Polyneuropathy, unspecified: Secondary | ICD-10-CM | POA: Diagnosis present

## 2020-08-30 DIAGNOSIS — Z66 Do not resuscitate: Secondary | ICD-10-CM | POA: Diagnosis not present

## 2020-08-30 DIAGNOSIS — K5900 Constipation, unspecified: Secondary | ICD-10-CM | POA: Diagnosis not present

## 2020-08-30 DIAGNOSIS — R9431 Abnormal electrocardiogram [ECG] [EKG]: Secondary | ICD-10-CM | POA: Diagnosis not present

## 2020-08-30 DIAGNOSIS — J9611 Chronic respiratory failure with hypoxia: Secondary | ICD-10-CM | POA: Diagnosis not present

## 2020-08-30 DIAGNOSIS — Z85828 Personal history of other malignant neoplasm of skin: Secondary | ICD-10-CM | POA: Diagnosis not present

## 2020-08-30 DIAGNOSIS — G4730 Sleep apnea, unspecified: Secondary | ICD-10-CM | POA: Diagnosis present

## 2020-08-30 DIAGNOSIS — Z8546 Personal history of malignant neoplasm of prostate: Secondary | ICD-10-CM

## 2020-08-30 DIAGNOSIS — R0689 Other abnormalities of breathing: Secondary | ICD-10-CM | POA: Diagnosis not present

## 2020-08-30 DIAGNOSIS — J441 Chronic obstructive pulmonary disease with (acute) exacerbation: Secondary | ICD-10-CM | POA: Diagnosis not present

## 2020-08-30 DIAGNOSIS — J80 Acute respiratory distress syndrome: Secondary | ICD-10-CM | POA: Diagnosis not present

## 2020-08-30 DIAGNOSIS — Z79899 Other long term (current) drug therapy: Secondary | ICD-10-CM

## 2020-08-30 DIAGNOSIS — J449 Chronic obstructive pulmonary disease, unspecified: Secondary | ICD-10-CM

## 2020-08-30 DIAGNOSIS — Z8619 Personal history of other infectious and parasitic diseases: Secondary | ICD-10-CM | POA: Diagnosis not present

## 2020-08-30 DIAGNOSIS — G5793 Unspecified mononeuropathy of bilateral lower limbs: Secondary | ICD-10-CM | POA: Diagnosis not present

## 2020-08-30 DIAGNOSIS — Z20822 Contact with and (suspected) exposure to covid-19: Secondary | ICD-10-CM | POA: Diagnosis present

## 2020-08-30 DIAGNOSIS — Z8249 Family history of ischemic heart disease and other diseases of the circulatory system: Secondary | ICD-10-CM | POA: Diagnosis not present

## 2020-08-30 DIAGNOSIS — J189 Pneumonia, unspecified organism: Secondary | ICD-10-CM | POA: Diagnosis not present

## 2020-08-30 DIAGNOSIS — Z9981 Dependence on supplemental oxygen: Secondary | ICD-10-CM | POA: Diagnosis not present

## 2020-08-30 DIAGNOSIS — E46 Unspecified protein-calorie malnutrition: Secondary | ICD-10-CM | POA: Diagnosis present

## 2020-08-30 DIAGNOSIS — Z28310 Unvaccinated for covid-19: Secondary | ICD-10-CM | POA: Diagnosis not present

## 2020-08-30 DIAGNOSIS — F17210 Nicotine dependence, cigarettes, uncomplicated: Secondary | ICD-10-CM | POA: Diagnosis present

## 2020-08-30 DIAGNOSIS — F10939 Alcohol use, unspecified with withdrawal, unspecified: Secondary | ICD-10-CM

## 2020-08-30 DIAGNOSIS — F10239 Alcohol dependence with withdrawal, unspecified: Secondary | ICD-10-CM | POA: Diagnosis present

## 2020-08-30 DIAGNOSIS — R0902 Hypoxemia: Secondary | ICD-10-CM | POA: Diagnosis not present

## 2020-08-30 LAB — COMPREHENSIVE METABOLIC PANEL
ALT: 9 U/L (ref 0–44)
AST: 17 U/L (ref 15–41)
Albumin: 3.6 g/dL (ref 3.5–5.0)
Alkaline Phosphatase: 56 U/L (ref 38–126)
Anion gap: 12 (ref 5–15)
BUN: 9 mg/dL (ref 8–23)
CO2: 34 mmol/L — ABNORMAL HIGH (ref 22–32)
Calcium: 8.5 mg/dL — ABNORMAL LOW (ref 8.9–10.3)
Chloride: 86 mmol/L — ABNORMAL LOW (ref 98–111)
Creatinine, Ser: 0.35 mg/dL — ABNORMAL LOW (ref 0.61–1.24)
GFR, Estimated: >60 mL/min (ref >60)
Glucose, Bld: 106 mg/dL — ABNORMAL HIGH (ref 70–99)
Potassium: 4.5 mmol/L (ref 3.5–5.1)
Sodium: 132 mmol/L — ABNORMAL LOW (ref 135–145)
Total Bilirubin: 0.9 mg/dL (ref 0.3–1.2)
Total Protein: 6.5 g/dL (ref 6.5–8.1)

## 2020-08-30 LAB — CBC WITH DIFFERENTIAL/PLATELET
Abs Immature Granulocytes: 0.1 10*3/uL — ABNORMAL HIGH (ref 0.00–0.07)
Basophils Absolute: 0.1 10*3/uL (ref 0.0–0.1)
Basophils Relative: 0 %
Eosinophils Absolute: 0 10*3/uL (ref 0.0–0.5)
Eosinophils Relative: 0 %
HCT: 40.8 % (ref 39.0–52.0)
Hemoglobin: 12.7 g/dL — ABNORMAL LOW (ref 13.0–17.0)
Immature Granulocytes: 1 %
Lymphocytes Relative: 4 %
Lymphs Abs: 0.6 10*3/uL — ABNORMAL LOW (ref 0.7–4.0)
MCH: 31 pg (ref 26.0–34.0)
MCHC: 31.1 g/dL (ref 30.0–36.0)
MCV: 99.5 fL (ref 80.0–100.0)
Monocytes Absolute: 0.9 10*3/uL (ref 0.1–1.0)
Monocytes Relative: 6 %
Neutro Abs: 14.3 10*3/uL — ABNORMAL HIGH (ref 1.7–7.7)
Neutrophils Relative %: 89 %
Platelets: 267 10*3/uL (ref 150–400)
RBC: 4.1 MIL/uL — ABNORMAL LOW (ref 4.22–5.81)
RDW: 13 % (ref 11.5–15.5)
WBC: 15.9 10*3/uL — ABNORMAL HIGH (ref 4.0–10.5)
nRBC: 0 % (ref 0.0–0.2)

## 2020-08-30 LAB — RESP PANEL BY RT-PCR (FLU A&B, COVID) ARPGX2
Influenza A by PCR: NEGATIVE
Influenza B by PCR: NEGATIVE
SARS Coronavirus 2 by RT PCR: NEGATIVE

## 2020-08-30 LAB — CBC
HCT: 38.8 % — ABNORMAL LOW (ref 39.0–52.0)
Hemoglobin: 12 g/dL — ABNORMAL LOW (ref 13.0–17.0)
MCH: 31.2 pg (ref 26.0–34.0)
MCHC: 30.9 g/dL (ref 30.0–36.0)
MCV: 100.8 fL — ABNORMAL HIGH (ref 80.0–100.0)
Platelets: 273 10*3/uL (ref 150–400)
RBC: 3.85 MIL/uL — ABNORMAL LOW (ref 4.22–5.81)
RDW: 13 % (ref 11.5–15.5)
WBC: 15.8 10*3/uL — ABNORMAL HIGH (ref 4.0–10.5)
nRBC: 0 % (ref 0.0–0.2)

## 2020-08-30 LAB — CREATININE, SERUM
Creatinine, Ser: 0.42 mg/dL — ABNORMAL LOW (ref 0.61–1.24)
GFR, Estimated: >60 mL/min (ref >60)

## 2020-08-30 LAB — TROPONIN I (HIGH SENSITIVITY): Troponin I (High Sensitivity): 9 ng/L (ref <18)

## 2020-08-30 MED ORDER — ACETAMINOPHEN 650 MG RE SUPP: 650.0000 mg | Freq: Four times a day (QID) | RECTAL | Status: DC | PRN

## 2020-08-30 MED ORDER — IPRATROPIUM-ALBUTEROL 0.5-2.5 (3) MG/3ML IN SOLN
3.0000 mL | Freq: Four times a day (QID) | RESPIRATORY_TRACT | Status: DC
  Administered 2020-08-30 – 2020-09-01 (×10): 3 mL via RESPIRATORY_TRACT
  Filled 2020-08-30 (×11): qty 3

## 2020-08-30 MED ORDER — CLONAZEPAM 1 MG PO TABS
1.0000 mg | ORAL_TABLET | Freq: Two times a day (BID) | ORAL | Status: DC | PRN
  Administered 2020-08-30 – 2020-09-02 (×5): 1 mg via ORAL
  Filled 2020-08-30 (×5): qty 1

## 2020-08-30 MED ORDER — ACETAMINOPHEN 325 MG PO TABS
650.0000 mg | ORAL_TABLET | Freq: Four times a day (QID) | ORAL | Status: DC | PRN
  Administered 2020-08-30 – 2020-09-01 (×3): 650 mg via ORAL
  Filled 2020-08-30 (×3): qty 2

## 2020-08-30 MED ORDER — GABAPENTIN 600 MG PO TABS
300.0000 mg | ORAL_TABLET | Freq: Every day | ORAL | Status: DC
  Administered 2020-08-30 – 2020-09-01 (×3): 300 mg via ORAL
  Filled 2020-08-30 (×3): qty 1

## 2020-08-30 MED ORDER — FOLIC ACID 1 MG PO TABS
1.0000 mg | ORAL_TABLET | Freq: Every day | ORAL | Status: DC
  Administered 2020-08-30 – 2020-09-01 (×3): 1 mg via ORAL
  Filled 2020-08-30: qty 1

## 2020-08-30 MED ORDER — AZITHROMYCIN 500 MG IV SOLR
500.0000 mg | INTRAVENOUS | Status: AC
Start: 2020-08-30 — End: 2020-08-30
  Administered 2020-08-30: 500 mg via INTRAVENOUS
  Filled 2020-08-30: qty 500

## 2020-08-30 MED ORDER — AZITHROMYCIN 250 MG PO TABS
500.0000 mg | ORAL_TABLET | Freq: Every day | ORAL | Status: DC
Start: 2020-08-31 — End: 2020-09-02
  Administered 2020-08-31 – 2020-09-02 (×3): 500 mg via ORAL
  Filled 2020-08-30 (×3): qty 2

## 2020-08-30 MED ORDER — BISACODYL 5 MG PO TBEC
5.0000 mg | DELAYED_RELEASE_TABLET | Freq: Every day | ORAL | Status: DC | PRN
  Filled 2020-08-30: qty 1

## 2020-08-30 MED ORDER — ALBUTEROL SULFATE (2.5 MG/3ML) 0.083% IN NEBU
15.0000 mg | INHALATION_SOLUTION | Freq: Once | RESPIRATORY_TRACT | Status: AC
  Administered 2020-08-30: 15 mg via RESPIRATORY_TRACT
  Filled 2020-08-30: qty 18

## 2020-08-30 MED ORDER — IPRATROPIUM BROMIDE 0.02 % IN SOLN
0.5000 mg | Freq: Once | RESPIRATORY_TRACT | Status: AC
  Administered 2020-08-30: 0.5 mg via RESPIRATORY_TRACT
  Filled 2020-08-30: qty 2.5

## 2020-08-30 MED ORDER — LORAZEPAM 1 MG PO TABS
1.0000 mg | ORAL_TABLET | ORAL | Status: AC | PRN
  Administered 2020-08-30: 2 mg via ORAL
  Administered 2020-08-31 (×2): 1 mg via ORAL
  Filled 2020-08-30: qty 1
  Filled 2020-08-30: qty 2
  Filled 2020-08-30: qty 1

## 2020-08-30 MED ORDER — NICOTINE 21 MG/24HR TD PT24
21.0000 mg | MEDICATED_PATCH | Freq: Every day | TRANSDERMAL | Status: DC
  Administered 2020-08-30 – 2020-09-02 (×4): 21 mg via TRANSDERMAL
  Filled 2020-08-30 (×4): qty 1

## 2020-08-30 MED ORDER — GUAIFENESIN ER 600 MG PO TB12
600.0000 mg | ORAL_TABLET | Freq: Two times a day (BID) | ORAL | Status: DC
  Administered 2020-08-30 – 2020-08-31 (×2): 600 mg via ORAL
  Filled 2020-08-30 (×3): qty 1

## 2020-08-30 MED ORDER — THIAMINE HCL 100 MG PO TABS
100.0000 mg | ORAL_TABLET | Freq: Every day | ORAL | Status: DC
  Administered 2020-08-30 – 2020-09-02 (×4): 100 mg via ORAL
  Filled 2020-08-30 (×4): qty 1

## 2020-08-30 MED ORDER — PREDNISONE 20 MG PO TABS
40.0000 mg | ORAL_TABLET | Freq: Every day | ORAL | Status: DC
  Administered 2020-08-31 – 2020-09-02 (×3): 40 mg via ORAL
  Filled 2020-08-30 (×3): qty 2

## 2020-08-30 MED ORDER — LORAZEPAM 2 MG/ML IJ SOLN
1.0000 mg | INTRAMUSCULAR | Status: AC | PRN
  Administered 2020-08-31: 1 mg via INTRAVENOUS
  Filled 2020-08-30: qty 1

## 2020-08-30 MED ORDER — THIAMINE HCL 100 MG/ML IJ SOLN: 100.0000 mg | Freq: Every day | INTRAMUSCULAR | Status: DC

## 2020-08-30 MED ORDER — AZITHROMYCIN 500 MG PO TABS: 500.0000 mg | ORAL_TABLET | Freq: Once | ORAL | Status: DC

## 2020-08-30 MED ORDER — ONDANSETRON HCL 4 MG/2ML IJ SOLN: 4.0000 mg | Freq: Four times a day (QID) | INTRAMUSCULAR | Status: DC | PRN

## 2020-08-30 MED ORDER — ONDANSETRON HCL 4 MG PO TABS
4.0000 mg | ORAL_TABLET | Freq: Four times a day (QID) | ORAL | Status: DC | PRN
  Administered 2020-09-01: 4 mg via ORAL
  Filled 2020-08-30: qty 1

## 2020-08-30 MED ORDER — ENOXAPARIN SODIUM 40 MG/0.4ML IJ SOSY
40.0000 mg | PREFILLED_SYRINGE | INTRAMUSCULAR | Status: DC
  Administered 2020-08-30 – 2020-09-01 (×3): 40 mg via SUBCUTANEOUS
  Filled 2020-08-30 (×3): qty 0.4

## 2020-08-30 MED ORDER — METHYLPREDNISOLONE SODIUM SUCC 40 MG IJ SOLR
40.0000 mg | Freq: Three times a day (TID) | INTRAMUSCULAR | Status: AC
  Administered 2020-08-30 – 2020-08-31 (×3): 40 mg via INTRAVENOUS
  Filled 2020-08-30 (×3): qty 1

## 2020-08-30 MED ORDER — ADULT MULTIVITAMIN W/MINERALS CH
1.0000 | ORAL_TABLET | Freq: Every day | ORAL | Status: DC
  Administered 2020-08-30 – 2020-09-02 (×4): 1 via ORAL
  Filled 2020-08-30 (×5): qty 1

## 2020-08-30 NOTE — Assessment & Plan Note (Signed)
Verified with pt and significant other that pt is a DNR/DNI. They will need assistance with filling out MOST/DNR form.

## 2020-08-30 NOTE — ED Triage Notes (Signed)
Pt in from home via [facility] with sob, cough, worsened x few days. Pt has hx COPD and throat CA, was hypoxic at 82% on EMS arrival, taking home duoneb. Given 2 duonebs and '125mg'$  Solumedrol en route. Wears 4.5LNC at home. C/o some chest soreness r/t cough. Sats 97% on 6L, no fevers

## 2020-08-30 NOTE — Subjective & Objective (Signed)
CC: SOB, cough, weakness HPI: 70 year old male with a history of terminal/end-stage COPD, chronic hypoxic respiratory failure on home oxygen at 4.5 liters a minute, protein calorie malnutrition, history of head neck cancer, history of prostate cancer presents the ER today with a several week history of worsening shortness of breath.  Patient's had increasing dyspnea and productive cough of the last 2 to 3 days.  He is so weak he cannot get out of bed.  He lives with his significant other [name].  She states that the patient has been increasingly dyspneic due to the warm and humid weather.  The air conditioning in their home only gets the room down to about 78 or 79 degrees.  Patient continues to smoke half pack a day.  Patient is interested in stopping smoking but nicotine patches are quite expensive.  Patient's significant other also states that the patient's COPD stipend was also cut down to $67 a month.  Patient is having a hard time affording food.  Interestingly enough, the patient does have funds to go to a local bar 3-4 times a week and consume 4-5 drinks at the bar each time he goes.  Patient called EMS today due to worsening shortness of breath.  Patient noted to be hypoxic with room air saturations of 82%.  Patient given 125 mg of Solu-Medrol in route.  Work-up in the ER chest x-ray is negative.  Patient given multiple nebs.  Patient still very dyspneic.  Due to COPD exacerbation, triad hospitalist contacted for admission.  After meeting with the patient and his significant other [name], they state that the patient had been followed by [facility] health care for outpatient palliative care.  They state that the nurse practitioner that was seeing them has since moved on to another practice and they have not seen out of care as an outpatient since June 2022.  They understand the patient has terminal COPD and are interested in outpatient hospice care.  Patient is verified that he wants to be a  DNR and DNI.

## 2020-08-30 NOTE — ED Provider Notes (Signed)
[facility]  ____________________________________________   Event Date/Time   First MD Initiated Contact with Patient 08/30/20 1030     (approximate)  I have reviewed the triage vital signs and the nursing notes.   HISTORY  Chief Complaint Cough and Shortness of Breath    HPI Kevin Nelson is a 70 y.o. male with past medical history of severe COPD on 4.5 L nasal cannula, history of laryngeal cancer, tobacco use who presents with shortness of breath.  Patient notes that his symptoms have been worsening over the last 2 weeks.  He is so dyspneic that he can barely get out of bed.  His wife notes that he was unable to walk to the bathroom today.  He has chronic cough productive of clear sputum.  He denies fevers or chills.  He denies chest pain or lower extremity swelling.  Patient's wife states that he has had 2 recent courses of antibiotics and steroids over the last several months.  She notes that he looks much worse than he normally does.         Past Medical History:  Diagnosis Date   Anxiety    Basal cell carcinoma    Cigarette smoker    Dental decay    Dysphagia    Dyspnea    End stage COPD ([place])    end stage copd, emphysema   Erectile dysfunction    Headache    every morning   Hepatitis C    treated for 8 weeks this past year with [facility]   History of nonmelanoma skin cancer    Hoarseness, chronic    Myocardial infarction ([place]) march/april 2020   mild heart attack   Prostate cancer ([place])    Respiratory failure, acute ([place])    was inpt for 5 days. thought he was going to die.   Sleep apnea    Squamous cell skin cancer    Throat cancer (HCC)    Use of leuprolide acetate (Lupron)    treated for prostate cancer.    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    Patient Active Problem List   Diagnosis Date Noted   Slow transit constipation 07/31/2020   Neuropathy of both feet 07/31/2020   Poor dentition 04/28/2020   B12  deficiency 11/29/2019   Goals of care, counseling/discussion 01/25/2019   Squamous cell carcinoma of larynx (HCC) 01/04/2019   Chronic nonintractable headache 11/10/2018   Sensorineural hearing loss (SNHL) 08/21/2018   Aortic atherosclerosis ([place]) 07/31/2018   Chronic hepatitis ([place]) 04/21/2018   Lung nodule < 6cm on CT 04/19/2018   Senile purpura ([place]) 04/19/2018   Protein-calorie malnutrition ([place]) 04/19/2018   Nicotine dependence, cigarettes, uncomplicated [id number]   Controlled substance agreement signed 01/27/2018   Chronic hypoxemic respiratory failure ([place]) 01/26/2018   COPD, very severe ([place]) 01/13/2018   Prostate cancer ([place]) 01/13/2018   History of basal cell carcinoma (BCC) 01/13/2018   Generalized anxiety disorder 01/13/2018    Past Surgical History:  Procedure Laterality Date   FACIAL LACERATION REPAIR     HERNIA REPAIR Left 1992   inguinal   IR GASTROSTOMY TUBE MOD SED  04/26/2019   MICROLARYNGOSCOPY N/A 01/17/2019   Procedure: MICROLARYNGOSCOPY WITH BX;  Surgeon: Clyde Canterbury, MD;  Location: [facility] ORS;  Service: ENT;  Laterality: N/A;   PORTA CATH INSERTION N/A 03/15/2019   Procedure: PORTA CATH INSERTION;  Surgeon: Algernon Huxley, MD;  Location: [place] [facility];  Service: Cardiovascular;  Laterality: N/A;   PORTA CATH REMOVAL N/A 10/08/2019   Procedure: PORTA CATH REMOVAL;  Surgeon: Algernon Huxley, MD;  Location: [place] [facility];  Service: Cardiovascular;  Laterality: N/A;   PROSTATE BIOPSY     SKIN CANCER EXCISION     TRIGGER FINGER RELEASE Left 10/2018    Prior to Admission medications   Medication Sig Start Date End Date Taking? Authorizing Provider  albuterol (PROVENTIL) (2.5 MG/3ML) 0.083% nebulizer solution Take 3 mLs (2.5 mg total) by nebulization every 6 (six) hours as needed for wheezing or shortness of breath. 12/05/19   Tyler Pita, MD  clonazePAM (KLONOPIN) 1 MG tablet TAKE 1 TABLET(1 MG) BY MOUTH TWICE DAILY AS NEEDED FOR ANXIETY 08/28/20    Cannady, [name] T, NP  fluticasone (FLONASE) 50 MCG/ACT nasal spray SHAKE LIQUID AND USE 1 SPRAY IN EACH NOSTRIL TWICE DAILY 07/08/20   Tyler Pita, MD  gabapentin (NEURONTIN) 600 MG tablet Take 0.5 tablets (300 mg total) by mouth at bedtime. 07/31/20   Cannady, [name] T, NP  ipratropium-albuterol (DUONEB) 0.5-2.5 (3) MG/3ML SOLN USE 3 ML VIA NEBULIZER EVERY 6 HOURS AS NEEDED 02/27/20   Cannady, Henrine Screws T, NP  lidocaine-prilocaine (EMLA) cream Apply topically once. 08/20/19   [provider]  lubiprostone (AMITIZA) 8 MCG capsule Take 1 capsule (8 mcg total) by mouth daily with breakfast. 07/31/20   Cannady, Henrine Screws T, NP  naloxegol oxalate (MOVANTIK) 25 MG TABS tablet Take 1 tablet (25 mg total) by mouth daily. 03/17/20   Sindy Guadeloupe, MD  OXYGEN Inhale 3 L into the lungs daily.    [provider]  traMADol (ULTRAM) 50 MG tablet Take 25 to 50 MG (1/2 to full tablet) by mouth every 8 hours as needed ONLY for SEVERE PAIN and post-op procedures.  Use sparingly and do not take at same time as Klonopin. 04/28/20   Cannady, Henrine Screws T, NP  triamcinolone (KENALOG) 0.1 % Apply topically. 12/20/19 12/19/20  [provider]  VENTOLIN HFA 108 (90 Base) MCG/ACT inhaler INHALE 2 PUFFS INTO THE LUNGS EVERY 6 HOURS AS NEEDED FOR WHEEZING OR SHORTNESS OF BREATH 07/03/20   Tyler Pita, MD    Allergies Patient has no known allergies.  Family History  Problem Relation Age of Onset   Cancer Sister    Cancer - Cervical Mother    Heart attack Brother    Heart attack Maternal Grandfather     Social History Social History   Tobacco Use   Smoking status: Every Day    Packs/day: 0.50    Years: 52.00    Pack years: 26.00    Types: Cigarettes   Smokeless tobacco: Never   Tobacco comments:    6-7 a day  05/01/2020  Vaping Use   Vaping Use: Never used  Substance Use Topics   Alcohol use: Not Currently    Alcohol/week: 3.0 standard drinks    Types: 3 Cans of beer per week   Drug  use: Never    Review of Systems   Review of Systems  Constitutional:  Negative for chills and fever.  HENT:  Negative for congestion.   Respiratory:  Positive for cough and shortness of breath. Negative for chest tightness.   Cardiovascular:  Negative for chest pain, palpitations and leg swelling.  Gastrointestinal:  Negative for abdominal pain, nausea and vomiting.  Neurological:  Negative for headaches.  All other systems reviewed and are negative.  Physical Exam Updated Vital Signs BP 119/69   Pulse [name])  110   Temp 97.8 F (36.6 C) (Oral)   Resp 19   SpO2 98%   Physical Exam Vitals and nursing note reviewed.  Constitutional:      General: He is not in acute distress.    Appearance: Normal appearance.     Comments: Patient is chronically ill-appearing Cachectic  HENT:     Head: Normocephalic and atraumatic.  Eyes:     General: No scleral icterus.    Conjunctiva/sclera: Conjunctivae normal.  Cardiovascular:     Rate and Rhythm: Tachycardia present.  Pulmonary:     Effort: Respiratory distress present.     Breath sounds: No wheezing.     Comments: Mild respiratory distress, poor air movement throughout Abdominal:     Palpations: Abdomen is soft.     Tenderness: There is no abdominal tenderness.  Musculoskeletal:        General: No deformity or signs of injury.     Cervical back: Normal range of motion.  Skin:    General: Skin is warm and dry.     Coloration: Skin is not jaundiced or pale.  Neurological:     General: No focal deficit present.     Mental Status: He is alert and oriented to person, place, and time. Mental status is at baseline.  Psychiatric:        Mood and Affect: Mood normal.        Behavior: Behavior normal.     LABS (all labs ordered are listed, but only abnormal results are displayed)  Labs Reviewed  CBC WITH DIFFERENTIAL/PLATELET - Abnormal; Notable for the following components:      Result Value   WBC 15.9 (*)    RBC 4.10 (*)     Hemoglobin 12.7 (*)    Neutro Abs 14.3 (*)    Lymphs Abs 0.6 (*)    Abs Immature Granulocytes 0.10 (*)    All other components within normal limits  COMPREHENSIVE METABOLIC PANEL - Abnormal; Notable for the following components:   Sodium 132 (*)    Chloride 86 (*)    CO2 34 (*)    Glucose, Bld 106 (*)    Creatinine, Ser 0.35 (*)    Calcium 8.5 (*)    All other components within normal limits  RESP PANEL BY RT-PCR (FLU A&B, COVID) ARPGX2  TROPONIN I (HIGH SENSITIVITY)   ____________________________________________  EKG  Sinus tachycardia, normal axis, normal intervals, no acute ischemic changes ____________________________________________  RADIOLOGY I, Madelin Headings, personally viewed and evaluated these images (plain radiographs) as part of my medical decision making, as well as reviewing the written report by the radiologist.  ED MD interpretation: I reviewed the chest x-ray which does not show any acute cardiopulmonary process, hyperinflated    ____________________________________________   PROCEDURES  Procedure(s) performed (including Critical Care):  Procedures   ____________________________________________   INITIAL IMPRESSION / ASSESSMENT AND PLAN / ED COURSE 70 year old male with end-stage COPD who presents with acute on chronic worsening of his shortness of breath.  He appears chronically ill.  Per his wife he is unable to to even get out of bed over the last week.  No chest pain.  On my exam he has very poor air movement.  His chest x-ray is hyperinflated without any focal infiltrate.  Labs notable for leukocytosis otherwise within normal range.  He was given albuterol and Solu-Medrol 125 with EMS.  He then received 1 hour-long albuterol treatment, Atrovent.  He continues to look overall poorly but not in  respiratory distress requiring BiPAP.  Given his failure to thrive at home will admit for ongoing treatment.      Clinical Course as of 08/30/20 1321   Sat Aug 30, 2020  1218 WBC(!): 15.9 [KM]    Clinical Course User Index [KM] Rada Hay, MD     ____________________________________________   FINAL CLINICAL IMPRESSION(S) / ED DIAGNOSES  Final diagnoses:  COPD exacerbation [facility])     ED Discharge Orders     None        Note:  This document was prepared using Dragon voice recognition software and may include unintentional dictation errors.    Rada Hay, MD 08/30/20 1321

## 2020-08-30 NOTE — Assessment & Plan Note (Signed)
Pt and his significant other patty honeycutt are agreeable to palliative care and hospice consult. Pt and significant other are aware that pt has terminal COPD. They used to have home palliative care NP going to see him, but they state that the nurse practitioner left the practice and no one has seen them since(last visit June 2022).  Explained that pt will need to have DNR and MOST form completed. Pt verified that he wants to be a DNR/DNI.

## 2020-08-30 NOTE — Assessment & Plan Note (Signed)
Present on admission. Chronic.

## 2020-08-30 NOTE — Assessment & Plan Note (Signed)
Continue neurontin 

## 2020-08-30 NOTE — Assessment & Plan Note (Signed)
Continue with 4-5 L/min. Pt would want bipap if needed for short term only as a bridge/transition to hospice care.

## 2020-08-30 NOTE — Assessment & Plan Note (Signed)
Admit to med-surg bed. Inpatient. Continue with IV solumedrol 40 mg q6h. Continue duonebq6h. Continue with po zithromax.

## 2020-08-30 NOTE — H&P (Signed)
History and Physical    Divyansh Lymon [id number] DOB: 16-Jul-1950 DOA: 08/30/2020  PCP: [name], NP   Patient coming from: Home  I have personally briefly reviewed patient's old medical records in [place]  CC: SOB, cough, weakness HPI: 70 year old male with a history of terminal/end-stage COPD, chronic hypoxic respiratory failure on home oxygen at 4.5 liters a minute, protein calorie malnutrition, history of head neck cancer, history of prostate cancer presents the ER today with a several week history of worsening shortness of breath.  Patient's had increasing dyspnea and productive cough of the last 2 to 3 days.  He is so weak he cannot get out of bed.  He lives with his significant other [name].  She states that the patient has been increasingly dyspneic due to the warm and humid weather.  The air conditioning in their home only gets the room down to about 78 or 79 degrees.  Patient continues to smoke half pack a day.  Patient is interested in stopping smoking but nicotine patches are quite expensive.  Patient's significant other also states that the patient's COPD stipend was also cut down to $67 a month.  Patient is having a hard time affording food.  Interestingly enough, the patient does have funds to go to a local bar 3-4 times a week and consume 4-5 drinks at the bar each time he goes.  Patient called EMS today due to worsening shortness of breath.  Patient noted to be hypoxic with room air saturations of 82%.  Patient given 125 mg of Solu-Medrol in route.  Work-up in the ER chest x-ray is negative.  Patient given multiple nebs.  Patient still very dyspneic.  Due to COPD exacerbation, [facility] contacted for admission.  After meeting with the patient and his significant other [name], they state that the patient had been followed by [facility] health care for outpatient palliative care.  They state that the nurse practitioner that was seeing them has since  moved on to another practice and they have not seen out of care as an outpatient since June 2022.  They understand the patient has terminal COPD and are interested in outpatient hospice care.  Patient is verified that he wants to be a DNR and DNI.     ED Course: seen in ER. CXR negative for pneumonia. Given duonebs.  Review of Systems:  Review of Systems  Constitutional:  Positive for chills, fever, malaise/fatigue and weight loss.  HENT: Negative.    Eyes: Negative.   Respiratory:  Positive for cough, sputum production, shortness of breath and wheezing.   Cardiovascular: Negative.   Gastrointestinal: Negative.   Genitourinary: Negative.   Musculoskeletal: Negative.   Skin: Negative.   Neurological:        +chronic neuropathy in his feet bilaterally  Endo/Heme/Allergies: Negative.   Psychiatric/Behavioral:  Positive for depression.   All other systems reviewed and are negative.  Past Medical History:  Diagnosis Date   Anxiety    Basal cell carcinoma    Cigarette smoker    Dental decay    Dysphagia    Dyspnea    End stage COPD ([place])    end stage copd, emphysema   Erectile dysfunction    Headache    every morning   Hepatitis C    treated for 8 weeks this past year with Mayvret   History of nonmelanoma skin cancer    Hoarseness, chronic    Myocardial infarction ([place]) march/april 2020   mild  heart attack   Prostate cancer ([place])    Respiratory failure, acute ([place])    was inpt for 5 days. thought he was going to die.   Sleep apnea    Squamous cell skin cancer    Throat cancer (HCC)    Use of leuprolide acetate (Lupron)    treated for prostate cancer.    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    Past Surgical History:  Procedure Laterality Date   FACIAL LACERATION REPAIR     HERNIA REPAIR Left 1992   inguinal   IR GASTROSTOMY TUBE MOD SED  04/26/2019   MICROLARYNGOSCOPY N/A 01/17/2019   Procedure: MICROLARYNGOSCOPY WITH BX;  Surgeon: Clyde Canterbury,  MD;  Location: [facility] ORS;  Service: ENT;  Laterality: N/A;   PORTA CATH INSERTION N/A 03/15/2019   Procedure: PORTA CATH INSERTION;  Surgeon: Algernon Huxley, MD;  Location: [place] [facility];  Service: Cardiovascular;  Laterality: N/A;   PORTA CATH REMOVAL N/A 10/08/2019   Procedure: PORTA CATH REMOVAL;  Surgeon: Algernon Huxley, MD;  Location: [place] [facility];  Service: Cardiovascular;  Laterality: N/A;   PROSTATE BIOPSY     SKIN CANCER EXCISION     TRIGGER FINGER RELEASE Left 10/2018     reports that he has been smoking cigarettes. He has a 26.00 pack-year smoking history. He has never used smokeless tobacco. He reports that he does not currently use alcohol after a past usage of about 3.0 standard drinks per week. He reports that he does not use drugs.  No Known Allergies  Family History  Problem Relation Age of Onset   Cancer Sister    Cancer - Cervical Mother    Heart attack Brother    Heart attack Maternal Grandfather     Prior to Admission medications   Medication Sig Start Date End Date Taking? Authorizing Provider  albuterol (PROVENTIL) (2.5 MG/3ML) 0.083% nebulizer solution Take 3 mLs (2.5 mg total) by nebulization every 6 (six) hours as needed for wheezing or shortness of breath. 12/05/19  Yes Tyler Pita, MD  clonazePAM (KLONOPIN) 1 MG tablet TAKE 1 TABLET(1 MG) BY MOUTH TWICE DAILY AS NEEDED FOR ANXIETY 08/28/20  Yes Cannady, [name] T, NP  docusate sodium (COLACE) 100 MG capsule Take 100-200 mg by mouth daily as needed for mild constipation or moderate constipation.   Yes [provider]  fluticasone (FLONASE) 50 MCG/ACT nasal spray SHAKE LIQUID AND USE 1 SPRAY IN EACH NOSTRIL TWICE DAILY 07/08/20  Yes Tyler Pita, MD  gabapentin (NEURONTIN) 600 MG tablet Take 0.5 tablets (300 mg total) by mouth at bedtime. 07/31/20  Yes Cannady, [name] T, NP  ipratropium-albuterol (DUONEB) 0.5-2.5 (3) MG/3ML SOLN USE 3 ML VIA NEBULIZER EVERY 6 HOURS AS NEEDED 02/27/20  Yes  Cannady, [name] T, NP  lubiprostone (AMITIZA) 8 MCG capsule Take 1 capsule (8 mcg total) by mouth daily with breakfast. Patient taking differently: Take 8 mcg by mouth 2 (two) times daily with a meal. 07/31/20  Yes Cannady, [name] T, NP  traMADol (ULTRAM) 50 MG tablet Take 25 to 50 MG (1/2 to full tablet) by mouth every 8 hours as needed ONLY for SEVERE PAIN and post-op procedures.  Use sparingly and do not take at same time as Klonopin. 04/28/20  Yes Cannady, [name] T, NP  VENTOLIN HFA 108 (90 Base) MCG/ACT inhaler INHALE 2 PUFFS INTO THE LUNGS EVERY 6 HOURS AS NEEDED FOR WHEEZING OR SHORTNESS OF BREATH 07/03/20  Yes Tyler Pita, MD  naloxegol oxalate (MOVANTIK) 25 MG TABS tablet Take 1 tablet (25 mg total) by mouth daily. Patient not taking: Reported on 08/30/2020 03/17/20   Sindy Guadeloupe, MD    Physical Exam: Vitals:   08/30/20 1230 08/30/20 1300 08/30/20 1330 08/30/20 1400  BP: 119/69 117/63 124/64 (!) 116/56  Pulse: (!) 110 (!) 106 (!) 105 (!) 103  Resp: 19 (!) 24    Temp:      TempSrc:      SpO2: 98% 98% 100% 97%    Physical Exam Vitals and nursing note reviewed.  Constitutional:      Appearance: He is ill-appearing. He is not toxic-appearing.     Comments: Chronically ill appearing  HENT:     Head: Normocephalic and atraumatic.  Eyes:     Pupils: Pupils are equal, round, and reactive to light.  Cardiovascular:     Rate and Rhythm: Regular rhythm. Tachycardia present.  Pulmonary:     Effort: Tachypnea present.     Breath sounds: Examination of the right-upper field reveals decreased breath sounds. Examination of the left-upper field reveals decreased breath sounds. Examination of the right-middle field reveals decreased breath sounds. Examination of the left-middle field reveals decreased breath sounds. Examination of the right-lower field reveals decreased breath sounds. Examination of the left-lower field reveals decreased breath sounds. Decreased breath sounds present.   Abdominal:     General: Bowel sounds are normal.     Palpations: Abdomen is soft. There is no mass.  Musculoskeletal:     Right lower leg: No edema.     Left lower leg: No edema.  Skin:    General: Skin is warm and dry.     Capillary Refill: Capillary refill takes less than 2 seconds.  Neurological:     Mental Status: He is alert and oriented to person, place, and time.     Labs on Admission: I have personally reviewed following labs and imaging studies  CBC: Recent Labs  Lab 08/30/20 1152  WBC 15.9*  NEUTROABS 14.3*  HGB 12.7*  HCT 40.8  MCV 99.5  PLT 99991111   Basic Metabolic Panel: Recent Labs  Lab 08/30/20 1152  NA 132*  K 4.5  CL 86*  CO2 34*  GLUCOSE 106*  BUN 9  CREATININE 0.35*  CALCIUM 8.5*   GFR: CrCl cannot be calculated (Unknown ideal weight.). Liver Function Tests: Recent Labs  Lab 08/30/20 1152  AST 17  ALT 9  ALKPHOS 56  BILITOT 0.9  PROT 6.5  ALBUMIN 3.6   No results for input(s): LIPASE, AMYLASE in the last 168 hours. No results for input(s): AMMONIA in the last 168 hours. Coagulation Profile: No results for input(s): INR, PROTIME in the last 168 hours. Cardiac Enzymes: No results for input(s): CKTOTAL, CKMB, CKMBINDEX, TROPONINI in the last 168 hours. BNP (last 3 results) No results for input(s): PROBNP in the last 8760 hours. HbA1C: No results for input(s): HGBA1C in the last 72 hours. CBG: No results for input(s): GLUCAP in the last 168 hours. Lipid Profile: No results for input(s): CHOL, HDL, LDLCALC, TRIG, CHOLHDL, LDLDIRECT in the last 72 hours. Thyroid Function Tests: No results for input(s): TSH, T4TOTAL, FREET4, T3FREE, THYROIDAB in the last 72 hours. Anemia Panel: No results for input(s): VITAMINB12, FOLATE, FERRITIN, TIBC, IRON, RETICCTPCT in the last 72 hours. Urine analysis:    Component Value Date/Time   COLORURINE YELLOW (A) 06/29/2019 1110   APPEARANCEUR Clear 11/29/2019 1112   LABSPEC 1.006  06/29/2019 1110    PHURINE 7.0 06/29/2019 1110   GLUCOSEU Negative 11/29/2019 1112   HGBUR NEGATIVE 06/29/2019 1110   BILIRUBINUR Negative 11/29/2019 1112   KETONESUR NEGATIVE 06/29/2019 1110   PROTEINUR Negative 11/29/2019 1112   PROTEINUR NEGATIVE 06/29/2019 1110   NITRITE Negative 11/29/2019 1112   NITRITE NEGATIVE 06/29/2019 1110   LEUKOCYTESUR Negative 11/29/2019 1112   LEUKOCYTESUR NEGATIVE 06/29/2019 1110    Radiological Exams on Admission: I have personally reviewed images DG Chest Portable 1 View  Result Date: 08/30/2020 CLINICAL DATA:  Shortness of breath. EXAM: PORTABLE CHEST 1 VIEW COMPARISON:  December 26, 2018 FINDINGS: Hyperinflation of the lungs identified. The heart, hila, and mediastinum are unchanged. No pulmonary nodules or masses. No infiltrates. IMPRESSION: Hyperinflation of the lungs consistent with COPD/emphysema. No acute abnormalities. Electronically Signed   By: Dorise Bullion III M.D.   On: 08/30/2020 12:08    EKG: I have personally reviewed EKG: sinus tachycardia   Assessment/Plan Principal Problem:   COPD with acute exacerbation (HCC) Active Problems:   End stage COPD (HCC)   Chronic hypoxemic respiratory failure (HCC) - on home O2 @ 4.5 L/min   Protein-calorie malnutrition (HCC)   Neuropathy of both feet   DNR (do not resuscitate)/DNI(Do Not Intubate)    COPD with acute exacerbation ([place]) Admit to med-surg bed. Inpatient. Continue with IV solumedrol 40 mg q6h. Continue duonebq6h. Continue with po zithromax.  End stage COPD ([place]) Pt and his significant other patty honeycutt are agreeable to palliative care and hospice consult. Pt and significant other are aware that pt has terminal COPD. They used to have home palliative care NP going to see him, but they state that the nurse practitioner left the practice and no one has seen them since(last visit June 2022).  Explained that pt will need to have DNR and MOST form completed. Pt verified that he wants to be a  DNR/DNI.  Chronic hypoxemic respiratory failure (HCC) - on home O2 @ 4.5 L/min Continue with 4-5 L/min. Pt would want bipap if needed for short term only as a bridge/transition to hospice care.  Protein-calorie malnutrition ([place]) Present on admission. Chronic.  Neuropathy of both feet Continue neurontin.  DNR (do not resuscitate)/DNI(Do Not Intubate) Verified with pt and significant other that pt is a DNR/DNI. They will need assistance with filling out MOST/DNR form.  DVT prophylaxis: Lovenox Code Status: DNR/DNI(Do NOT Intubate) Family Communication: discussed with pt and significant other patty honeycutt.  Disposition Plan: pt and significant other would like outpatient hospice. They are open to hospice house if needed  Consults called: placed order for palliative care consult  Admission status: Inpatient, Med-Surg   Kristopher Oppenheim, DO [facility] 08/30/2020, 2:07 PM

## 2020-08-31 DIAGNOSIS — F10939 Alcohol use, unspecified with withdrawal, unspecified: Secondary | ICD-10-CM

## 2020-08-31 DIAGNOSIS — F10239 Alcohol dependence with withdrawal, unspecified: Secondary | ICD-10-CM

## 2020-08-31 LAB — COMPREHENSIVE METABOLIC PANEL
ALT: 9 U/L (ref 0–44)
AST: 11 U/L — ABNORMAL LOW (ref 15–41)
Albumin: 3.3 g/dL — ABNORMAL LOW (ref 3.5–5.0)
Alkaline Phosphatase: 45 U/L (ref 38–126)
Anion gap: 5 (ref 5–15)
BUN: 11 mg/dL (ref 8–23)
CO2: 41 mmol/L — ABNORMAL HIGH (ref 22–32)
Calcium: 8.5 mg/dL — ABNORMAL LOW (ref 8.9–10.3)
Chloride: 87 mmol/L — ABNORMAL LOW (ref 98–111)
Creatinine, Ser: 0.33 mg/dL — ABNORMAL LOW (ref 0.61–1.24)
GFR, Estimated: >60 mL/min (ref >60)
Glucose, Bld: 125 mg/dL — ABNORMAL HIGH (ref 70–99)
Potassium: 4.6 mmol/L (ref 3.5–5.1)
Sodium: 133 mmol/L — ABNORMAL LOW (ref 135–145)
Total Bilirubin: 0.8 mg/dL (ref 0.3–1.2)
Total Protein: 5.8 g/dL — ABNORMAL LOW (ref 6.5–8.1)

## 2020-08-31 LAB — CBC
HCT: 33.2 % — ABNORMAL LOW (ref 39.0–52.0)
Hemoglobin: 10.6 g/dL — ABNORMAL LOW (ref 13.0–17.0)
MCH: 31.1 pg (ref 26.0–34.0)
MCHC: 31.9 g/dL (ref 30.0–36.0)
MCV: 97.4 fL (ref 80.0–100.0)
Platelets: 272 10*3/uL (ref 150–400)
RBC: 3.41 MIL/uL — ABNORMAL LOW (ref 4.22–5.81)
RDW: 12.7 % (ref 11.5–15.5)
WBC: 9.9 10*3/uL (ref 4.0–10.5)
nRBC: 0 % (ref 0.0–0.2)

## 2020-08-31 LAB — PHOSPHORUS: Phosphorus: 2.4 mg/dL — ABNORMAL LOW (ref 2.5–4.6)

## 2020-08-31 LAB — HIV ANTIBODY (ROUTINE TESTING W REFLEX): HIV Screen 4th Generation wRfx: NONREACTIVE

## 2020-08-31 LAB — MAGNESIUM: Magnesium: 1.9 mg/dL (ref 1.7–2.4)

## 2020-08-31 MED ORDER — THIAMINE HCL 100 MG PO TABS: 100.0000 mg | ORAL_TABLET | Freq: Every day | ORAL | Status: DC

## 2020-08-31 MED ORDER — ADULT MULTIVITAMIN W/MINERALS CH: 1.0000 | ORAL_TABLET | Freq: Every day | ORAL | Status: DC

## 2020-08-31 MED ORDER — THIAMINE HCL 100 MG/ML IJ SOLN: 100.0000 mg | Freq: Every day | INTRAMUSCULAR | Status: DC

## 2020-08-31 MED ORDER — FOLIC ACID 1 MG PO TABS
1.0000 mg | ORAL_TABLET | Freq: Every day | ORAL | Status: DC
  Administered 2020-09-02: 1 mg via ORAL
  Filled 2020-08-31 (×3): qty 1

## 2020-08-31 MED ORDER — GUAIFENESIN-DM 100-10 MG/5ML PO SYRP
10.0000 mL | ORAL_SOLUTION | ORAL | Status: DC | PRN
  Administered 2020-08-31: 10 mL via ORAL
  Filled 2020-08-31: qty 10

## 2020-08-31 MED ORDER — LORAZEPAM 2 MG/ML IJ SOLN: 1.0000 mg | INTRAMUSCULAR | Status: DC | PRN

## 2020-08-31 MED ORDER — LORAZEPAM 1 MG PO TABS: 1.0000 mg | ORAL_TABLET | ORAL | Status: DC | PRN

## 2020-08-31 NOTE — Progress Notes (Signed)
This RN called to room by pt's significant other. She states pt is having difficulty breathing. VS checked and stable. Charge Patent examiner at bedside. Dr. Leslye Peer notified via secure chat. Will monitor.

## 2020-08-31 NOTE — Progress Notes (Signed)
TOC consult for SA resources. Patient currently not medically stable. Per rounds, having DTs and on CIWA. TOC to follow up when appropriate to offer resources.  Oleh Genin, [place]

## 2020-08-31 NOTE — Progress Notes (Signed)
Patient ID: Kevin Nelson, male   DOB: [birth year], 70 y.o.   MRN: [id number] [facility] PROGRESS NOTE  Kevin Nelson [id number] DOB: 1950-11-12 DOA: 08/30/2020 PCP: [name], NP  HPI/Subjective: Patient states that his breathing has not been good with the humidity.  Have any worsening shortness of breath.  Some cough but dry.  Unable to bring up any phlegm.  Admitted with COPD exacerbation.  Patient was telling stories about how he was diagnosed with throat cancer and prostate cancer.  Objective: Vitals:   08/31/20 1023 08/31/20 1024  BP: 120/67 120/67  Pulse: (!) 103 97  Resp:  16  Temp:    SpO2: 100% 100%    Intake/Output Summary (Last 24 hours) at 08/31/2020 1320 Last data filed at 08/31/2020 0900 Gross per 24 hour  Intake 490 ml  Output 220 ml  Net 270 ml   Filed Weights   08/31/20 0500  Weight: 43.9 kg    ROS: Review of Systems  Respiratory:  Positive for cough, shortness of breath and wheezing.   Cardiovascular:  Negative for chest pain.  Gastrointestinal:  Negative for abdominal pain, nausea and vomiting.  Exam: Physical Exam HENT:     Head: Normocephalic.     Mouth/Throat:     Pharynx: No oropharyngeal exudate.  Eyes:     General: Lids are normal.     Conjunctiva/sclera: Conjunctivae normal.  Cardiovascular:     Rate and Rhythm: Normal rate and regular rhythm.     Heart sounds: Normal heart sounds, S1 normal and S2 normal.  Pulmonary:     Breath sounds: Examination of the right-upper field reveals decreased breath sounds. Examination of the left-upper field reveals decreased breath sounds. Examination of the right-middle field reveals decreased breath sounds. Examination of the left-middle field reveals decreased breath sounds. Examination of the right-lower field reveals decreased breath sounds. Examination of the left-lower field reveals decreased breath sounds. Decreased breath sounds present. No wheezing, rhonchi or rales.  Abdominal:      Palpations: Abdomen is soft.     Tenderness: There is no abdominal tenderness.  Musculoskeletal:     Right ankle: No swelling.     Left ankle: No swelling.  Skin:    General: Skin is warm.     Findings: No rash.  Neurological:     Mental Status: He is alert.     Comments: Patient was able to answer yes/no questions and follow commands and tell me stories.      Scheduled Meds:  azithromycin  500 mg Oral Daily   enoxaparin (LOVENOX) injection  40 mg Subcutaneous [id number]   folic acid  1 mg Oral Daily   folic acid  1 mg Oral Daily   gabapentin  300 mg Oral QHS   ipratropium-albuterol  3 mL Nebulization Q6H   multivitamin with minerals  1 tablet Oral Daily   nicotine  21 mg Transdermal Daily   predniSONE  40 mg Oral Q breakfast   thiamine  100 mg Oral Daily   Or   thiamine  100 mg Intravenous Daily   Brief history: Patient admitted 08/30/2020 with shortness of breath and cough and found to have COPD exacerbation.  Past medical history of end-stage COPD on chronic 4.5 L of oxygen, throat cancer, prostate cancer, CAD, hepatitis C and alcohol abuse.  Patient being treated for alcohol withdrawal and COPD exacerbation.  Assessment/Plan:  COPD exacerbation.  Continue IV Solu-Medrol 40 mg IV every 6 hours and oral Zithromax and nebulizer treatments.  Patient still has poor air entry. End-stage COPD with chronic hypoxic respiratory failure on 4.5 L of oxygen.  Palliative care consultation. Alcohol withdrawal.  Continue alcohol withdrawal protocol.  Nursing staff has had to give him Ativan. Severe protein calorie malnutrition Neuropathy bilateral feet on gabapentin Tobacco abuse on nicotine patch     Code Status:     Code Status Orders  (From admission, onward)           Start     Ordered   08/30/20 1426  Do not attempt resuscitation (DNR)  Continuous       Question Answer Comment  In the event of cardiac or respiratory ARREST Do not call a "code blue"   In the event of cardiac  or respiratory ARREST Do not perform Intubation, CPR, defibrillation or ACLS   In the event of cardiac or respiratory ARREST Use medication by any route, position, wound care, and other measures to relive pain and suffering. May use oxygen, suction and manual treatment of airway obstruction as needed for comfort.   Comments discussed with patient and significant other patty honeycutt      08/30/20 1426           Code Status History     Date Active Date Inactive Code Status Order ID Comments User Context   08/30/2020 1335 08/30/2020 1426 DNR [id number]  Kristopher Oppenheim, DO ED   09/29/2018 1744 10/01/2018 1707 DNR [id number]  Henreitta Leber, MD Inpatient   04/15/2018 0220 04/16/2018 1546 Full Code [id number]  Marygrace Drought, MD Inpatient      Family Communication: Spoke with [name] at the bedside Disposition Plan: Status is: Inpatient  Dispo: The patient is from: Home              Anticipated d/c is to: To be determined based on clinical course              Patient currently being treated for COPD exacerbation and alcohol withdrawal   Difficult to place patient.  No.  Consultants: Palliative care  Antibiotics: Oral Zithromax  Time spent: 29 minutes  [place]

## 2020-09-01 ENCOUNTER — Inpatient Hospital Stay: Payer: Medicare Other

## 2020-09-01 ENCOUNTER — Telehealth: Payer: Self-pay

## 2020-09-01 MED ORDER — IPRATROPIUM-ALBUTEROL 0.5-2.5 (3) MG/3ML IN SOLN
3.0000 mL | Freq: Three times a day (TID) | RESPIRATORY_TRACT | Status: DC
  Administered 2020-09-02 (×2): 3 mL via RESPIRATORY_TRACT
  Filled 2020-09-01: qty 3

## 2020-09-01 MED ORDER — SALINE SPRAY 0.65 % NA SOLN
1.0000 | NASAL | Status: DC | PRN
  Filled 2020-09-01: qty 44

## 2020-09-01 MED ORDER — LUBIPROSTONE 8 MCG PO CAPS
8.0000 ug | ORAL_CAPSULE | Freq: Two times a day (BID) | ORAL | Status: DC
  Administered 2020-09-01 – 2020-09-02 (×2): 8 ug via ORAL
  Filled 2020-09-01 (×4): qty 1

## 2020-09-01 MED ORDER — FLUTICASONE PROPIONATE 50 MCG/ACT NA SUSP
1.0000 | Freq: Every day | NASAL | Status: DC
  Administered 2020-09-01 – 2020-09-02 (×2): 1 via NASAL
  Filled 2020-09-01: qty 16

## 2020-09-01 NOTE — Progress Notes (Signed)
[facility] [place] NAME: Kevin Nelson    MR#:  [id number]  DATE OF BIRTH:  06-01-1950  SUBJECTIVE:   Complains of abdominal pain and constipation. Wants me to resume his home bowel regimen. Has been eating a little bit. [name] is at bedside. Answered most of the questions patient and [name] had. Patient is requesting to go home. He is in agreement with hospice to follow given his poor prognosis REVIEW OF SYSTEMS:   Review of Systems  Constitutional:  Positive for malaise/fatigue and weight loss. Negative for chills and fever.  HENT:  Negative for ear discharge, ear pain and nosebleeds.   Eyes:  Negative for blurred vision, pain and discharge.  Respiratory:  Positive for cough and shortness of breath. Negative for sputum production, wheezing and stridor.   Cardiovascular:  Negative for chest pain, palpitations, orthopnea and PND.  Gastrointestinal:  Negative for abdominal pain, diarrhea, nausea and vomiting.  Genitourinary:  Negative for frequency and urgency.  Musculoskeletal:  Negative for back pain and joint pain.  Neurological:  Positive for weakness. Negative for sensory change, speech change and focal weakness.  Psychiatric/Behavioral:  Negative for depression and hallucinations. The patient is not nervous/anxious.   Tolerating Diet: Tolerating PT:   DRUG ALLERGIES:  No Known Allergies  VITALS:  Blood pressure 123/63, pulse 88, temperature 98.6 F (37 C), resp. rate 20, weight 43.9 kg, SpO2 100 %.  PHYSICAL EXAMINATION:   Physical Exam  GENERAL:  70 y.o.-year-old patient lying in the bed with no acute distress. cachectic appears chronically ill LUNGS: distant breath sounds bilaterally, no wheezing, rales, rhonchi. No use of accessory muscles of respiration.  CARDIOVASCULAR: S1, S2 normal. No murmurs, rubs, or gallops.  ABDOMEN: Soft, nontender, nondistended. Bowel sounds present. No organomegaly or mass.  EXTREMITIES: No cyanosis,  clubbing or edema b/l.    NEUROLOGIC: nonfocal, deconditioned PSYCHIATRIC:  patient is alert and oriented x 3.  SKIN: No obvious rash, lesion, or ulcer.   LABORATORY PANEL:  CBC Recent Labs  Lab 08/31/20 0529  WBC 9.9  HGB 10.6*  HCT 33.2*  PLT 272    Chemistries  Recent Labs  Lab 08/31/20 0529  NA 133*  K 4.6  CL 87*  CO2 41*  GLUCOSE 125*  BUN 11  CREATININE 0.33*  CALCIUM 8.5*  MG 1.9  AST 11*  ALT 9  ALKPHOS 45  BILITOT 0.8   Cardiac Enzymes No results for input(s): TROPONINI in the last 168 hours. RADIOLOGY:  DG Abd 1 View  Result Date: 09/01/2020 CLINICAL DATA:  Constipation EXAM: ABDOMEN - 1 VIEW COMPARISON:  PET CT 02/05/2020 . FINDINGS: No bowel dilatation to suggest obstruction. Small volume of formed stool in the colon, primarily ascending. No abnormal rectal distention. No increased stool burden to suggest constipation. No visualized radiopaque calculi. Stable sclerotic focus in the right iliac bone. No acute lung base findings. IMPRESSION: Small volume colonic stool, not suggestive of constipation. No evidence of obstruction or abnormal bowel distension. Electronically Signed   By: Keith Rake M.D.   On: 09/01/2020 15:29   ASSESSMENT AND PLAN:  70 year old male with a history of terminal/end-stage COPD, chronic hypoxic respiratory failure on home oxygen at 4.5 liters a minute, protein calorie malnutrition, history of head neck cancer, history of prostate cancer presents the ER today with a several week history of worsening shortness of breath.  Patient's had increasing dyspnea and productive cough of the last 2 to 3 days.  He is so weak he cannot get out of bed.  He lives with his significant other [name].   COPD with acute exacerbation ([place]) End stage COPD ([place]) Chronic hypoxemic respiratory failure (HCC) - on home O2 @ 4.5 L/min ongoing tobacco abuse -- patient was started on IV Solu-Medrol now on PO prednisone -- empiric PO Zithromax --  continue scheduled units Q6 hourly and flow nasal .-- On 4.5 L nasal cannula oxygen which is chronic -- overall poor prognosis. -- patient still continues to smoke  --Pt and his significant other patty honeycutt are agreeable to palliative care and hospice services at home -- Pt and significant other are aware that pt has terminal COPD.  -- patient is DNR DNI   Protein-calorie malnutrition ([place]) failure to thrive history of head and neck cancer, history of prostate cancer Present on admission. Chronic. Hospice to follow at home overall poor prognosis   Neuropathy of both feet Continue neurontin.   DNR (do not resuscitate)/DNI(Do Not Intubate) Verified by admitting physician with pt and significant other that pt is a DNR/DNI. They will need assistance with filling out MOST/DNR form.  Abdominal pain with constipation -- x-ray KUB shows small colonic stool. No evidence of obstruction -- per patient constipation is chronic. -- continue PRN stool softener and Amitiza  patient is requesting to go home. Discussed with patient and [name] in the room that I will arrange hospice services and consider discharge tomorrow. There in agreement.  Procedures: Family communication :[name] Consults : CODE STATUS: DNR/DNI DVT Prophylaxis :lovenox Level of care: Med-Surg Status is: Inpatient  Remains inpatient appropriate because:Unsafe d/c plan  Dispo: The patient is from: Home              Anticipated d/c is to: Home              Patient currently medically best optimized   Difficult to place patient No        TOTAL TIME TAKING CARE OF THIS PATIENT: 25 minutes.  >50% time spent on counselling and coordination of care  Note: This dictation was prepared with Dragon dictation along with smaller phrase technology. Any transcriptional errors that result from this process are unintentional.  Fritzi Mandes M.D    [facility]   CC: Primary care physician; [name], NP  Patient ID: Kevin Nelson, male   DOB: 1950-08-13, 70 y.o.   MRN: [id number]

## 2020-09-01 NOTE — Progress Notes (Deleted)
Chronic Care Management Pharmacy Note  09/01/2020 Name:  Kevin Nelson MRN:  [id number] DOB:  1950-08-25  Summary:  Recommendations/Changes made from today's visit:  Plan:  Subjective: Kevin Nelson is an 70 y.o. year old male who is a primary patient of Cannady, [name], NP.  The CCM team was consulted for assistance with disease management and care coordination needs.    {CCMTELEPHONEFACETOFACE:21091510} for {CCMINITIALFOLLOWUPCHOICE:21091511} in response to provider referral for pharmacy case management and/or care coordination services.   Consent to Services:  {CCMCONSENTOPTIONS:25074}  Patient Care Team: [name], NP as PCP - General (Nurse Practitioner) Vanita Ingles, RN as Case Manager (General Practice)  Recent office visits: ***  Recent consult visits: [facility] visits: {Hospital DC Yes/No:21091515}  Objective:  Lab Results  Component Value Date   CREATININE 0.33 (L) 08/31/2020   CREATININE 0.42 (L) 08/30/2020   CREATININE 0.35 (L) 08/30/2020    Lab Results  Component Value Date   HGBA1C 5.1 01/28/2020   Last diabetic Eye exam: No results found for: HMDIABEYEEXA  Last diabetic Foot exam: No results found for: HMDIABFOOTEX      Component Value Date/Time   CHOL 207 (H) 01/28/2020 1109   TRIG 105 01/28/2020 1109   HDL 92 01/28/2020 1109   LDLCALC 97 01/28/2020 1109    Hepatic Function Latest Ref Rng & Units 08/31/2020 08/30/2020 12/21/2019  Total Protein 6.5 - 8.1 g/dL 5.8(L) 6.5 7.3  Albumin 3.5 - 5.0 g/dL 3.3(L) 3.6 4.4  AST 15 - 41 U/L 11(L) 17 22  ALT 0 - 44 U/L _0 Alk Phosphatase 38 - 126 U/L 45 56 49  Total Bilirubin 0.3 - 1.2 mg/dL 0.8 0.9 0.9  Bilirubin, Direct 0.0 - 0.2 mg/dL - - -    Lab Results  Component Value Date/Time   TSH 0.864 01/28/2020 11:09 AM   TSH 0.914 11/29/2019 10:34 AM    CBC Latest Ref Rng & Units 08/31/2020 08/30/2020 08/30/2020  WBC 4.0 - 10.5 K/uL 9.9 15.8(H) 15.9(H)  Hemoglobin 13.0 - 17.0  g/dL 10.6(L) 12.0(L) 12.7(L)  Hematocrit 39.0 - 52.0 % 33.2(L) 38.8(L) 40.8  Platelets 150 - 400 K/uL 272 273 267    No results found for: VD25OH  Clinical ASCVD: {YES/NO:21197} The ASCVD Risk score Mikey Bussing DC Jr., et al., 2013) failed to calculate for the following reasons:   The patient has a prior MI or stroke diagnosis    Other: (CHADS2VASc if Afib, PHQ9 if depression, MMRC or CAT for COPD, ACT, DEXA)  Social History   Tobacco Use  Smoking Status Every Day   Packs/day: 0.50   Years: 52.00   Pack years: 26.00   Types: Cigarettes  Smokeless Tobacco Never  Tobacco Comments   6-7 a day  05/01/2020   BP Readings from Last 3 Encounters:  09/01/20 107/71  07/31/20 116/64  05/01/20 128/72   Pulse Readings from Last 3 Encounters:  09/01/20 79  07/31/20 87  07/07/20 100   Wt Readings from Last 3 Encounters:  08/31/20 96 lb 11.2 oz (43.9 kg)  07/31/20 99 lb 9.6 oz (45.2 kg)  07/07/20 98 lb 9.6 oz (44.7 kg)    Assessment: Review of patient past medical history, allergies, medications, health status, including review of consultants reports, laboratory and other test data, was performed as part of comprehensive evaluation and provision of chronic care management services.   SDOH:  (Social Determinants of Health) assessments and interventions performed:    [place]  No Known Allergies  Medications Reviewed Today     Reviewed by Smith, Richard, CPhT (Pharmacy Technician) on 08/30/20 at 1351  Med List Status: Complete   Medication Order Taking? Sig Documenting Provider Last Dose Status Informant  albuterol (PROVENTIL) (2.5 MG/3ML) 0.083% nebulizer solution [id number] Yes Take 3 mLs (2.5 mg total) by nebulization every 6 (six) hours as needed for wheezing or shortness of breath. Gonzalez, Carmen L, MD Unknown PRN Active Family Member  clonazePAM (KLONOPIN) 1 MG tablet [id number] Yes TAKE 1 TABLET(1 MG) BY MOUTH TWICE DAILY AS NEEDED FOR ANXIETY Cannady, [name] T, NP Unknown  PRN Active Family Member  docusate sodium (COLACE) 100 MG capsule [id number] Yes Take 100-200 mg by mouth daily as needed for mild constipation or moderate constipation. [provider] Unknown PRN Active Family Member  fluticasone (FLONASE) 50 MCG/ACT nasal spray [id number] Yes SHAKE LIQUID AND USE 1 SPRAY IN EACH NOSTRIL TWICE DAILY Gonzalez, Carmen L, MD  Active Family Member  gabapentin (NEURONTIN) 600 MG tablet [id number] Yes Take 0.5 tablets (300 mg total) by mouth at bedtime. Cannady, [name] T, NP 08/29/2020 2000 Active Family Member  ipratropium-albuterol (DUONEB) 0.5-2.5 (3) MG/3ML SOLN [id number] Yes USE 3 ML VIA NEBULIZER EVERY 6 HOURS AS NEEDED Cannady, [name] T, NP Unknown PRN Active Family Member  lubiprostone (AMITIZA) 8 MCG capsule [id number] Yes Take 1 capsule (8 mcg total) by mouth daily with breakfast.  Patient taking differently: Take 8 mcg by mouth 2 (two) times daily with a meal.   Cannady, [name] T, NP 08/29/2020 Unknown Active Family Member  naloxegol oxalate (MOVANTIK) 25 MG TABS tablet [id number] No Take 1 tablet (25 mg total) by mouth daily.  Patient not taking: Reported on 08/30/2020   Rao, Archana C, MD Not Taking Active Family Member  traMADol (ULTRAM) 50 MG tablet [id number] Yes Take 25 to 50 MG (1/2 to full tablet) by mouth every 8 hours as needed ONLY for SEVERE PAIN and post-op procedures.  Use sparingly and do not take at same time as Klonopin. Cannady, [name] T, NP Unknown PRN Active Family Member  VENTOLIN HFA 108 (90 Base) MCG/ACT inhaler [id number] Yes INHALE 2 PUFFS INTO THE LUNGS EVERY 6 HOURS AS NEEDED FOR WHEEZING OR SHORTNESS OF BREATH Gonzalez, Carmen L, MD Unknown PRN Active Family Member            Patient Active Problem List   Diagnosis Date Noted   Alcohol withdrawal syndrome with complication (HCC)    DNR (do not resuscitate)/DNI(Do Not Intubate) 08/30/2020   COPD exacerbation (HCC) 08/30/2020   Slow transit constipation 07/31/2020    Neuropathy of both feet 07/31/2020   Poor dentition 04/28/2020   B12 deficiency 11/29/2019   Goals of care, counseling/discussion 01/25/2019   Squamous cell carcinoma of larynx (HCC) 01/04/2019   Chronic nonintractable headache 11/10/2018   COPD with acute exacerbation (HCC) 09/29/2018   Sensorineural hearing loss (SNHL) 08/21/2018   Aortic atherosclerosis (HCC) 07/31/2018   Chronic hepatitis (HCC) 04/21/2018   Lung nodule < 6cm on CT 04/19/2018   Senile purpura (HCC) 04/19/2018   Protein-calorie malnutrition (HCC) 04/19/2018   Nicotine dependence, cigarettes, uncomplicated 01/27/2018   Controlled substance agreement signed 01/27/2018   Chronic respiratory failure with hypoxia (HCC) 01/26/2018   End stage COPD (HCC) 01/13/2018   Prostate cancer (HCC) 01/13/2018   History of basal cell carcinoma (BCC) 01/13/2018   Generalized anxiety disorder 01/13/2018    Immunization History  Administered Date(s) Administered   IPV 08/26/1954, 09/30/1954   Influenza-Unspecified 10/18/2017     Moderna Sars-Covid-2 Vaccination 08/13/2019, 09/13/2019    Conditions to be addressed/monitored: {CCM ASSESSMENT DISEASE OPTIONS:25047}  There are no care plans that you recently modified to display for this patient.   Medication Assistance: {MEDASSISTANCEINFO:25044}  Patient's preferred pharmacy is:  [facility] STORE #[id number] [place], [place] - [address] AT [facility] [place] [address] Phone: [phone] Fax: [phone]  Uses pill box? {Yes or If no, why not?:20788} Pt endorses ***% compliance  Follow Up:  {FOLLOWUP:24991}  Plan: {CM FOLLOW UP PLAN:25073}  SIG***

## 2020-09-01 NOTE — TOC Progression Note (Signed)
Transition of Care [facility]) - Progression Note    Patient Details  Name: Kevin Nelson MRN: [id number] Date of Birth: [birth year]  Transition of [facility]) CM/SW Contact  Eileen Stanford, LCSW Phone Number: 09/01/2020, 3:32 PM  Clinical Narrative:   Referral provided to Karene Fry with [facility] to arranged home hospice.         Expected Discharge Plan and Services                                                 Social Determinants of Health (SDOH) Interventions    Readmission Risk Interventions No flowsheet data found.

## 2020-09-01 NOTE — Progress Notes (Signed)
[facility]) Hospital Liaison RN Note  Received request from Loletha Grayer, [place] for hospice services at home after discharge. Chart and patient information under review by [facility] physician. Hospice eligibility pending at this time.   Spoke with patient and patient's significant other Vinie Sill to initiate education related to hospice philosophy, services and team approach to care. Patient/family verbalized understanding.   DME needs discussed. Denies needs for DME at this time. Patient will be staying at his significant other's address at [address] in [place], [place].   Please send signed and completed DNR home with patient/family at discharge. Please provide prescriptions at discharge as needed to ensure ongoing symptom management.   ACC information and contact numbers given to [facility].   Please call with any hospice related questions.   Thank you for the opportunity to participate in this patient's care.   Bobbie "Loren Racer, RN, BSN [facility] Liaison [phone]

## 2020-09-02 ENCOUNTER — Encounter: Payer: Self-pay | Admitting: Internal Medicine

## 2020-09-02 ENCOUNTER — Telehealth: Payer: Self-pay

## 2020-09-02 MED ORDER — ADULT MULTIVITAMIN W/MINERALS CH: 1.0000 | ORAL_TABLET | Freq: Every day | ORAL | 0 refills | Status: AC

## 2020-09-02 MED ORDER — NICOTINE 21 MG/24HR TD PT24: 21.0000 mg | MEDICATED_PATCH | Freq: Every day | TRANSDERMAL | 0 refills | Status: AC

## 2020-09-02 MED ORDER — SALINE SPRAY 0.65 % NA SOLN: 1.0000 | NASAL | 0 refills | Status: AC | PRN

## 2020-09-02 MED ORDER — PREDNISONE 20 MG PO TABS: 40.0000 mg | ORAL_TABLET | Freq: Every day | ORAL | 0 refills | Status: AC

## 2020-09-02 MED ORDER — GUAIFENESIN-DM 100-10 MG/5ML PO SYRP: 10.0000 mL | ORAL_SOLUTION | ORAL | 0 refills | Status: AC | PRN

## 2020-09-02 MED ORDER — AZITHROMYCIN 250 MG PO TABS: ORAL_TABLET | ORAL | 0 refills | Status: DC

## 2020-09-02 NOTE — Progress Notes (Signed)
Discharge instructions, RX's and follow up appts explained and provided to patient and c/g verbalized understanding. Patient left floor via wheelchair accompanied by staff no c/o pain or shortness of breath at d/c.  Home hospice set up for patient contact information on AVS. Yellow DNR form in d/c packet.  Ellanie Oppedisano, Tivis Ringer, RN

## 2020-09-02 NOTE — Discharge Instructions (Signed)
Continue your oxygen, inhaler, nasal spray as before advised smoking cessation and abstain from drinking alcohol! Hospice to follow at home

## 2020-09-02 NOTE — Telephone Encounter (Signed)
Called and left a detailed message letting them know [name] will be the attending.

## 2020-09-02 NOTE — Care Management Important Message (Signed)
Important Message  Patient Details  Name: Kevin Nelson MRN: [id number] Date of Birth: 1950/12/18   Medicare Important Message Given:  Other (see comment)  Discharging with hospice services  Medicare IM withheld at this time.    Dannette Barbara 09/02/2020, 11:11 AM

## 2020-09-02 NOTE — Plan of Care (Signed)

## 2020-09-02 NOTE — Telephone Encounter (Signed)
Copied from [place] [phone]. Topic: General - Other >> Sep 01, 2020  4:47 PM [name] A wrote: Reason for CRM: Horris Latino with [facility] hospice has made a call on behalf of the patient requesting for Prov. Cannady to be their attending PCP once they're discharged from the hospital today 09/01/20  Please contact to discuss further if needed

## 2020-09-02 NOTE — Discharge Summary (Signed)
[name] NAME: Kevin Nelson    MR#:  [id number]  DATE OF BIRTH:  1950/07/28  DATE OF ADMISSION:  08/30/2020 ADMITTING PHYSICIAN: Kevin Oppenheim, DO  DATE OF DISCHARGE: 09/02/2020  PRIMARY CARE PHYSICIAN: Kevin Lick, NP    ADMISSION DIAGNOSIS:  COPD exacerbation (Kevin Nelson) [J44.1]  DISCHARGE DIAGNOSIS:  acute on chronic hypoxic respiratory failure secondary to end-stage COPD exacerbation with ongoing tobacco abuse severe protein calorie malnutrition/failure to thrive  SECONDARY DIAGNOSIS:   Past Medical History:  Diagnosis Date   Anxiety    Basal cell carcinoma    Cigarette smoker    Dental decay    Dysphagia    Dyspnea    End stage COPD ([place])    end stage copd, emphysema   Erectile dysfunction    Headache    every morning   Hepatitis C    treated for 8 weeks this past year with [facility]   History of nonmelanoma skin cancer    Hoarseness, chronic    Myocardial infarction ([place]) march/april 2020   mild heart attack   Prostate cancer ([place])    Respiratory failure, acute ([place])    was inpt for 5 days. thought he was going to die.   Sleep apnea    Squamous cell skin cancer    Throat cancer (HCC)    Use of leuprolide acetate (Lupron)    treated for prostate cancer.    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  Patient's had increasing dyspnea and productive cough of the last 2 to 3 days.  He is so weak he cannot get out of bed.  He lives with his significant other Kevin Nelson.    COPD with acute exacerbation (HCC) End stage COPD ([place]) Acute on chronic hypoxemic respiratory failure  (HCC) - on home O2 @ 4.5 L/min ongoing tobacco abuse -- patient was started on IV Solu-Medrol now on PO prednisone -- empiric PO Zithromax x 5 days -- continue scheduled units Q6 hourly and Flonase .-- On 4.5 L nasal cannula oxygen which is chronic -- overall poor long-term prognosis. -- patient still continues to smoke--advised cessation. Nicotine patch  --Pt and his significant other Kevin Nelson are agreeable to palliative care and hospice services at home -- Pt and significant other are aware that pt has terminal COPD.  -- patient is DNR DNI   Protein-calorie malnutrition ([place]) failure to thrive history of head and neck cancer, history of prostate cancer Present on admission. Chronic. Hospice to follow at home per pt and fmaily request overall poor prognosis  history of alcoholism/ongoing tobacco abuse -- patient was placed on CIWA protocol. No signs of withdrawal. Patient advised to abstain from drinking and smoking   Neuropathy of both feet Continue neurontin.   DNR (do not resuscitate)/DNI(Do Not Intubate) Verified by admitting physician with pt and significant other that pt is a DNR/DNI.    Abdominal pain with constipation -- x-ray KUB shows small colonic stool. No evidence of obstruction -- per patient constipation is chronic. -- continue PRN stool softener and Amitiza   patient is requesting to go home. Discussed with patient and  Kevin in the room that  will arrange hospice services. Patient will discharged home he is in agreement.   Procedures: Family communication :Kevin Consults :none CODE STATUS: DNR/DNI DVT Prophylaxis :lovenox Level of care: Med-Surg Status is: Inpatient     Dispo: The patient is from: Home              Anticipated d/c is to: Home today with hospice              Patient currently medically best optimized              Difficult to place patient No     CONSULTS OBTAINED:    DRUG ALLERGIES:  No Known Allergies  DISCHARGE  MEDICATIONS:   Allergies as of 09/02/2020   No Known Allergies      Medication List     STOP taking these medications    naloxegol oxalate 25 MG Tabs tablet Commonly known as: Movantik       TAKE these medications    albuterol (2.5 MG/3ML) 0.083% nebulizer solution Commonly known as: PROVENTIL Take 3 mLs (2.5 mg total) by nebulization every 6 (six) hours as needed for wheezing or shortness of breath.   Ventolin HFA 108 (90 Base) MCG/ACT inhaler Generic drug: albuterol INHALE 2 PUFFS INTO THE LUNGS EVERY 6 HOURS AS NEEDED FOR WHEEZING OR SHORTNESS OF BREATH   azithromycin 250 MG tablet Commonly known as: ZITHROMAX Take as directed Start taking on: September 03, 2020   clonazePAM 1 MG tablet Commonly known as: KLONOPIN TAKE 1 TABLET(1 MG) BY MOUTH TWICE DAILY AS NEEDED FOR ANXIETY   docusate sodium 100 MG capsule Commonly known as: COLACE Take 100-200 mg by mouth daily as needed for mild constipation or moderate constipation.   fluticasone 50 MCG/ACT nasal spray Commonly known as: FLONASE SHAKE LIQUID AND USE 1 SPRAY IN EACH NOSTRIL TWICE DAILY   gabapentin 600 MG tablet Commonly known as: Neurontin Take 0.5 tablets (300 mg total) by mouth at bedtime.   guaiFENesin-dextromethorphan 100-10 MG/5ML syrup Commonly known as: ROBITUSSIN DM Take 10 mLs by mouth every 4 (four) hours as needed for cough (chest congestion).   ipratropium-albuterol 0.5-2.5 (3) MG/3ML Soln Commonly known as: DUONEB USE 3 ML VIA NEBULIZER EVERY 6 HOURS AS NEEDED   lubiprostone 8 MCG capsule Commonly known as: AMITIZA Take 1 capsule (8 mcg total) by mouth daily with breakfast. What changed: when to take this   multivitamin with minerals Tabs tablet Take 1 tablet by mouth daily. Start taking on: September 03, 2020   nicotine 21 mg/24hr patch Commonly known as: NICODERM CQ - dosed in mg/24 hours Place 1 patch (21 mg total) onto the skin daily. Start taking on: September 03, 2020    predniSONE 20 MG tablet Commonly known as: DELTASONE Take 2 tablets (40 mg total) by mouth daily with breakfast for 4 days. Start taking on: September 03, 2020   sodium chloride 0.65 % Soln nasal spray Commonly known as: OCEAN Place 1 spray into both nostrils as needed for congestion.   traMADol 50 MG tablet Commonly known as: ULTRAM Take 25 to 50 MG (1/2 to full tablet) by mouth every 8 hours as needed ONLY for SEVERE PAIN and post-op procedures.  Use sparingly and do not take at same time as Klonopin.        If you experience worsening of your admission symptoms, develop shortness of breath, life threatening emergency, suicidal or homicidal thoughts you must seek medical attention immediately by  calling 911 or calling your MD immediately  if symptoms less severe.  You Must read complete instructions/literature along with all the possible adverse reactions/side effects for all the Medicines you take and that have been prescribed to you. Take any new Medicines after you have completely understood and accept all the possible adverse reactions/side effects.   Please note  You were cared for by a hospitalist during your hospital stay. If you have any questions about your discharge medications or the care you received while you were in the hospital after you are discharged, you can call the unit and asked to speak with the hospitalist on call if the hospitalist that took care of you is not available. Once you are discharged, your primary care physician will handle any further medical issues. Please note that NO REFILLS for any discharge medications will be authorized once you are discharged, as it is imperative that you return to your primary care physician (or establish a relationship with a primary care physician if you do not have one) for your aftercare needs so that they can reassess your need for medications and monitor your lab values. Today   SUBJECTIVE   No new complaints Chronic  cough and chronic shortness of breath.  VITAL SIGNS:  Blood pressure 119/63, pulse 82, temperature 98.2 F (36.8 C), resp. rate 16, weight 45 kg, SpO2 97 %.  I/O:   Intake/Output Summary (Last 24 hours) at 09/02/2020 0948 Last data filed at 09/02/2020 0532 Gross per 24 hour  Intake 240 ml  Output 400 ml  Net -160 ml    PHYSICAL EXAMINATION:  GENERAL:  70 y.o.-year-old patient lying in the bed with no acute distress. thin,cachectic  and appears chronically ill LUNGS: distant breath sounds bilaterally, no wheezing, rales, rhonchi. No use of accessory muscles of respiration.  CARDIOVASCULAR: S1, S2 normal. No murmurs, rubs, or gallops.  ABDOMEN: Soft, nontender, nondistended. Bowel sounds present. No organomegaly or mass.  EXTREMITIES: No cyanosis, clubbing or edema b/l.    NEUROLOGIC: nonfocal, deconditioned PSYCHIATRIC:  patient is alert and oriented x 3.  SKIN: No obvious rash, lesion, or ulcer DATA REVIEW:   CBC  Recent Labs  Lab 08/31/20 0529  WBC 9.9  HGB 10.6*  HCT 33.2*  PLT 272    Chemistries  Recent Labs  Lab 08/31/20 0529  NA 133*  K 4.6  CL 87*  CO2 41*  GLUCOSE 125*  BUN 11  CREATININE 0.33*  CALCIUM 8.5*  MG 1.9  AST 11*  ALT 9  ALKPHOS 45  BILITOT 0.8    Microbiology Results   Recent Results (from the past 240 hour(s))  Resp Panel by RT-PCR (Flu A&B, Covid) Nasopharyngeal Swab     Status: None   Collection Time: 08/30/20 11:52 AM   Specimen: Nasopharyngeal Swab; Nasopharyngeal(NP) swabs in vial transport medium  Result Value Ref Range Status   SARS Coronavirus 2 by RT PCR NEGATIVE NEGATIVE Final    Comment: (NOTE) SARS-CoV-2 target nucleic acids are NOT DETECTED.  The SARS-CoV-2 RNA is generally detectable in upper respiratory specimens during the acute phase of infection. The lowest concentration of SARS-CoV-2 viral copies this assay can detect is 138 copies/mL. A negative result does not preclude SARS-Cov-2 infection and should not be  used as the sole basis for treatment or other patient management decisions. A negative result may occur with  improper specimen collection/handling, submission of specimen other than nasopharyngeal swab, presence of viral mutation(s) within the areas targeted by this assay, and  inadequate number of viral copies(<138 copies/mL). A negative result must be combined with clinical observations, patient history, and epidemiological information. The expected result is Negative.  Fact Sheet for Patients:  EntrepreneurPulse.com.au  Fact Sheet for Healthcare Providers:  IncredibleEmployment.be  This test is no t yet approved or cleared by the [place] FDA and  has been authorized for detection and/or diagnosis of SARS-CoV-2 by FDA under an Emergency Use Authorization (EUA). This EUA will remain  in effect (meaning this test can be used) for the duration of the COVID-19 declaration under Section 564(b)(1) of the Act, 21 U.S.C.section 360bbb-3(b)(1), unless the authorization is terminated  or revoked sooner.       Influenza A by PCR NEGATIVE NEGATIVE Final   Influenza B by PCR NEGATIVE NEGATIVE Final    Comment: (NOTE) The Xpert Xpress SARS-CoV-2/FLU/RSV plus assay is intended as an aid in the diagnosis of influenza from Nasopharyngeal swab specimens and should not be used as a sole basis for treatment. Nasal washings and aspirates are unacceptable for Xpert Xpress SARS-CoV-2/FLU/RSV testing.  Fact Sheet for Patients: EntrepreneurPulse.com.au  Fact Sheet for Healthcare Providers: IncredibleEmployment.be  This test is not yet approved or cleared by the [place] FDA and has been authorized for detection and/or diagnosis of SARS-CoV-2 by FDA under an Emergency Use Authorization (EUA). This EUA will remain in effect (meaning this test can be used) for the duration of the COVID-19 declaration under Section  564(b)(1) of the Act, 21 U.S.C. section 360bbb-3(b)(1), unless the authorization is terminated or revoked.  Performed at [facility], [address] [place], [place] [address]     RADIOLOGY:  Bea Graff 1 View  Result Date: 09/01/2020 CLINICAL DATA:  Constipation EXAM: ABDOMEN - 1 VIEW COMPARISON:  PET CT 02/05/2020 . FINDINGS: No bowel dilatation to suggest obstruction. Small volume of formed stool in the colon, primarily ascending. No abnormal rectal distention. No increased stool burden to suggest constipation. No visualized radiopaque calculi. Stable sclerotic focus in the right iliac bone. No acute lung base findings. IMPRESSION: Small volume colonic stool, not suggestive of constipation. No evidence of obstruction or abnormal bowel distension. Electronically Signed   By: Keith Rake M.D.   On: 09/01/2020 15:29     CODE STATUS:     Code Status Orders  (From admission, onward)           Start     Ordered   08/30/20 1426  Do not attempt resuscitation (DNR)  Continuous       Question Answer Comment  In the event of cardiac or respiratory ARREST Do not call a "code blue"   In the event of cardiac or respiratory ARREST Do not perform Intubation, CPR, defibrillation or ACLS   In the event of cardiac or respiratory ARREST Use medication by any route, position, wound care, and other measures to relive pain and suffering. May use oxygen, suction and manual treatment of airway obstruction as needed for comfort.   Comments discussed with patient and significant other Kevin Nelson      08/30/20 1426           Code Status History     Date Active Date Inactive Code Status Order ID Comments User Context   08/30/2020 1335 08/30/2020 1426 DNR [id number]  Kevin Oppenheim, DO ED   09/29/2018 1744 10/01/2018 1707 DNR [id number]  Henreitta Leber, MD Inpatient   04/15/2018 0220 04/16/2018 1546 Full Code [id number]  Marygrace Drought, MD Inpatient  TOTAL TIME TAKING CARE OF  THIS PATIENT: 35 minutes.    Fritzi Mandes M.D  [facility]    CC: Primary care physician; Kevin Lick, NP

## 2020-09-02 NOTE — Progress Notes (Signed)
[facility]) Hospital Liaison RN note  Mr. Morss has been approved for hospice services at home.  Plan is for discharge today by private vehicle.  Met with patient at bedside. He denies any needs at this time.  Please call with any hospice related questions or concerns.  Thank you, Margaretmary Eddy, BSN, RN [facility] Liaison [phone]

## 2020-09-03 ENCOUNTER — Telehealth: Payer: Self-pay

## 2020-09-04 DIAGNOSIS — G629 Polyneuropathy, unspecified: Secondary | ICD-10-CM | POA: Diagnosis not present

## 2020-09-04 DIAGNOSIS — R911 Solitary pulmonary nodule: Secondary | ICD-10-CM | POA: Diagnosis not present

## 2020-09-04 DIAGNOSIS — Z872 Personal history of diseases of the skin and subcutaneous tissue: Secondary | ICD-10-CM | POA: Diagnosis not present

## 2020-09-04 DIAGNOSIS — F32A Depression, unspecified: Secondary | ICD-10-CM | POA: Diagnosis not present

## 2020-09-04 DIAGNOSIS — D649 Anemia, unspecified: Secondary | ICD-10-CM | POA: Diagnosis not present

## 2020-09-04 DIAGNOSIS — R634 Abnormal weight loss: Secondary | ICD-10-CM | POA: Diagnosis not present

## 2020-09-04 DIAGNOSIS — I252 Old myocardial infarction: Secondary | ICD-10-CM | POA: Diagnosis not present

## 2020-09-04 DIAGNOSIS — R519 Headache, unspecified: Secondary | ICD-10-CM | POA: Diagnosis not present

## 2020-09-04 DIAGNOSIS — Z87891 Personal history of nicotine dependence: Secondary | ICD-10-CM | POA: Diagnosis not present

## 2020-09-04 DIAGNOSIS — R1310 Dysphagia, unspecified: Secondary | ICD-10-CM | POA: Diagnosis not present

## 2020-09-04 DIAGNOSIS — F1011 Alcohol abuse, in remission: Secondary | ICD-10-CM | POA: Diagnosis not present

## 2020-09-04 DIAGNOSIS — J302 Other seasonal allergic rhinitis: Secondary | ICD-10-CM | POA: Diagnosis not present

## 2020-09-04 DIAGNOSIS — J9690 Respiratory failure, unspecified, unspecified whether with hypoxia or hypercapnia: Secondary | ICD-10-CM | POA: Diagnosis not present

## 2020-09-04 DIAGNOSIS — J439 Emphysema, unspecified: Secondary | ICD-10-CM | POA: Diagnosis not present

## 2020-09-04 DIAGNOSIS — Z8546 Personal history of malignant neoplasm of prostate: Secondary | ICD-10-CM | POA: Diagnosis not present

## 2020-09-04 DIAGNOSIS — Z9981 Dependence on supplemental oxygen: Secondary | ICD-10-CM | POA: Diagnosis not present

## 2020-09-04 DIAGNOSIS — B1920 Unspecified viral hepatitis C without hepatic coma: Secondary | ICD-10-CM | POA: Diagnosis not present

## 2020-09-04 DIAGNOSIS — I2510 Atherosclerotic heart disease of native coronary artery without angina pectoris: Secondary | ICD-10-CM | POA: Diagnosis not present

## 2020-09-04 DIAGNOSIS — F419 Anxiety disorder, unspecified: Secondary | ICD-10-CM | POA: Diagnosis not present

## 2020-09-05 ENCOUNTER — Telehealth: Payer: Self-pay | Admitting: Nurse Practitioner

## 2020-09-05 ENCOUNTER — Telehealth: Payer: Self-pay

## 2020-09-05 ENCOUNTER — Other Ambulatory Visit: Payer: Self-pay | Admitting: Nurse Practitioner

## 2020-09-05 DIAGNOSIS — Z9981 Dependence on supplemental oxygen: Secondary | ICD-10-CM | POA: Diagnosis not present

## 2020-09-05 DIAGNOSIS — J439 Emphysema, unspecified: Secondary | ICD-10-CM | POA: Diagnosis not present

## 2020-09-05 DIAGNOSIS — B1920 Unspecified viral hepatitis C without hepatic coma: Secondary | ICD-10-CM | POA: Diagnosis not present

## 2020-09-05 DIAGNOSIS — J9690 Respiratory failure, unspecified, unspecified whether with hypoxia or hypercapnia: Secondary | ICD-10-CM | POA: Diagnosis not present

## 2020-09-05 DIAGNOSIS — G629 Polyneuropathy, unspecified: Secondary | ICD-10-CM | POA: Diagnosis not present

## 2020-09-05 DIAGNOSIS — J302 Other seasonal allergic rhinitis: Secondary | ICD-10-CM | POA: Diagnosis not present

## 2020-09-05 MED ORDER — GABAPENTIN 600 MG PO TABS: 300.0000 mg | ORAL_TABLET | Freq: Three times a day (TID) | ORAL | 4 refills | Status: DC

## 2020-09-05 MED ORDER — MORPHINE SULFATE (CONCENTRATE) 20 MG/ML PO SOLN: 0.2000 mg | ORAL | 0 refills | Status: DC | PRN

## 2020-09-05 NOTE — Telephone Encounter (Signed)
Wells Guiles from [facility], calling to get clarity on directions for medication,  Liquid Morphine . She states take 0.01 ml or '2mg'$  , wants to make sure that's correct. Please call back

## 2020-09-05 NOTE — Telephone Encounter (Signed)
Copied from [place] [phone]. Topic: General - Other >> Sep 05, 2020  1:09 PM [name] A wrote: Reason for CRM: Mitzi with [facility] hosp[facility] has called regarding medications for the patient  Mitzi is requesting for patient's gabapentin to be increased because the patient is experiencing pain in toes and lower abdomen   Patient has rated the discomfort in both areas as a 7 out of [place] is also requesting for the patient to be prescribed  Liquid Morphine '20MG'$  per mL  Please contact further when possible    Routing to provider to advise

## 2020-09-05 NOTE — Telephone Encounter (Signed)
Spoke to [employer] with hospice and have entered orders for changes and Morphine.

## 2020-09-05 NOTE — Progress Notes (Signed)
Spoke to [employer], hospice nurse, via telephone and reviewed patient's needs.  Will increase Gabapentin to 300 MG TID and add on Morphine solution per hospice -- 0.2 MG Q2HRS for moderate to severe pain -- 30 ML bottle sent.

## 2020-09-05 NOTE — Telephone Encounter (Signed)
[facility] called and spoke to [place], [facility] about the clarification on the dosage instructions for Morphone on profile. I read what the instructions are. She says she has that as well and is wanting the provider to clarify if this is what she truly wants. She says that is a very small amount and she doesn't have the syringe to dose that small amount, also the 30 ml is a strange amount. I advised the office is closed so the provider will have to clarify on Monday. She says that will be fine for the provider to call back on Monday.

## 2020-09-08 ENCOUNTER — Telehealth: Payer: Medicare Other

## 2020-09-08 ENCOUNTER — Other Ambulatory Visit: Payer: Self-pay | Admitting: Nurse Practitioner

## 2020-09-08 ENCOUNTER — Inpatient Hospital Stay: Payer: Medicare Other | Admitting: Nurse Practitioner

## 2020-09-08 DIAGNOSIS — J9690 Respiratory failure, unspecified, unspecified whether with hypoxia or hypercapnia: Secondary | ICD-10-CM | POA: Diagnosis not present

## 2020-09-08 DIAGNOSIS — Z9981 Dependence on supplemental oxygen: Secondary | ICD-10-CM | POA: Diagnosis not present

## 2020-09-08 DIAGNOSIS — B1920 Unspecified viral hepatitis C without hepatic coma: Secondary | ICD-10-CM | POA: Diagnosis not present

## 2020-09-08 DIAGNOSIS — J302 Other seasonal allergic rhinitis: Secondary | ICD-10-CM | POA: Diagnosis not present

## 2020-09-08 DIAGNOSIS — J439 Emphysema, unspecified: Secondary | ICD-10-CM | POA: Diagnosis not present

## 2020-09-08 DIAGNOSIS — G629 Polyneuropathy, unspecified: Secondary | ICD-10-CM | POA: Diagnosis not present

## 2020-09-08 MED ORDER — MORPHINE SULFATE (CONCENTRATE) 20 MG/ML PO SOLN: ORAL | 0 refills | Status: DC

## 2020-09-08 NOTE — Telephone Encounter (Signed)
Willa with Hospice called wanting to know if patient can still take the tramadol with the other the other pain medications.  CB#  [phone]

## 2020-09-08 NOTE — Telephone Encounter (Signed)
Routing to provider to advise.  

## 2020-09-08 NOTE — Telephone Encounter (Signed)
Routing to provider to clarify.

## 2020-09-08 NOTE — Telephone Encounter (Signed)
Spoke with Willa from Hospice and notified her that per [facility] that patient should not take the two medications of Morphine and Tramadol together as they have risk factors for the patient. Willa asked if patient should still continue his Prednisone treatment as he completed his 4 day course of treatment Saturday evening. Per [name], she would send in a lower dosage to help patient as the higher dosages could be a harm to patient's bones. Willa verbalized understanding and has no further questions.

## 2020-09-11 DIAGNOSIS — J9690 Respiratory failure, unspecified, unspecified whether with hypoxia or hypercapnia: Secondary | ICD-10-CM | POA: Diagnosis not present

## 2020-09-11 DIAGNOSIS — G629 Polyneuropathy, unspecified: Secondary | ICD-10-CM | POA: Diagnosis not present

## 2020-09-11 DIAGNOSIS — B1920 Unspecified viral hepatitis C without hepatic coma: Secondary | ICD-10-CM | POA: Diagnosis not present

## 2020-09-11 DIAGNOSIS — Z9981 Dependence on supplemental oxygen: Secondary | ICD-10-CM | POA: Diagnosis not present

## 2020-09-11 DIAGNOSIS — J439 Emphysema, unspecified: Secondary | ICD-10-CM | POA: Diagnosis not present

## 2020-09-11 DIAGNOSIS — J302 Other seasonal allergic rhinitis: Secondary | ICD-10-CM | POA: Diagnosis not present

## 2020-09-15 ENCOUNTER — Other Ambulatory Visit: Payer: Medicare Other | Admitting: Student

## 2020-09-15 ENCOUNTER — Telehealth: Payer: Self-pay

## 2020-09-15 DIAGNOSIS — J302 Other seasonal allergic rhinitis: Secondary | ICD-10-CM | POA: Diagnosis not present

## 2020-09-15 DIAGNOSIS — J9690 Respiratory failure, unspecified, unspecified whether with hypoxia or hypercapnia: Secondary | ICD-10-CM | POA: Diagnosis not present

## 2020-09-15 DIAGNOSIS — Z9981 Dependence on supplemental oxygen: Secondary | ICD-10-CM | POA: Diagnosis not present

## 2020-09-15 DIAGNOSIS — J439 Emphysema, unspecified: Secondary | ICD-10-CM | POA: Diagnosis not present

## 2020-09-15 DIAGNOSIS — G629 Polyneuropathy, unspecified: Secondary | ICD-10-CM | POA: Diagnosis not present

## 2020-09-15 DIAGNOSIS — B1920 Unspecified viral hepatitis C without hepatic coma: Secondary | ICD-10-CM | POA: Diagnosis not present

## 2020-09-15 MED ORDER — OXYCODONE-ACETAMINOPHEN 10-325 MG PO TABS: 1.0000 | ORAL_TABLET | Freq: Two times a day (BID) | ORAL | 0 refills | Status: DC | PRN

## 2020-09-15 MED ORDER — GABAPENTIN 600 MG PO TABS: 300.0000 mg | ORAL_TABLET | Freq: Every day | ORAL | 4 refills | Status: AC

## 2020-09-15 MED ORDER — LORAZEPAM 1 MG PO TABS: 1.0000 mg | ORAL_TABLET | Freq: Every day | ORAL | 0 refills | Status: DC | PRN

## 2020-09-15 NOTE — Telephone Encounter (Signed)
Spoke to [place], Merchand[employer], [employer], on phone.  She feels he will plummet soon -- is not doing as well.  She would also like a few tablets of Ativan as needed due to her concern for DT with cutting back.  He is drinking 4-5 beers a day, when he woke up the other day he was really shaky.  She recommended just sending a few for DTs only as he is currently on Klonopin for anxiety.  She reports he was concerned about his fatigue, she educated him on this today and his current illness with worsening COPD.  He does not want to take Morphine as feels it makes him more fatigued and she reports he is not in the place yet to accept benefit of this and where he is -- she has discussed with him.  Would like to take Oxycodone instead which he took in past for pain -- discussed at length with Otila Kluver, she had discussed with patient too.  He would also like to lower Gabapentin dosing to just at night.  PDMP reviewed -- Morphine fill 09/08/20 and Klonopin 08/28/20.

## 2020-09-15 NOTE — Telephone Encounter (Signed)
Copied from [place] [phone]. Topic: General - Inquiry >> Sep 15, 2020  2:56 PM Robina Ade, Helene Kelp D wrote: Reason for CRM: Otila Kluver with Hospice called and wanted to let [name] know what patient does not want to take morphine for pain anymore instead he would like oxycodone. Also in addition he would also like to decrease his gabapentin to 1X at bed time. And last Otila Kluver wants to know if patient can get Adivan for not being able to drink and it causes him to do into Dts.   Routing to provider to advise.

## 2020-09-18 DIAGNOSIS — R634 Abnormal weight loss: Secondary | ICD-10-CM | POA: Diagnosis not present

## 2020-09-18 DIAGNOSIS — J302 Other seasonal allergic rhinitis: Secondary | ICD-10-CM | POA: Diagnosis not present

## 2020-09-18 DIAGNOSIS — G629 Polyneuropathy, unspecified: Secondary | ICD-10-CM | POA: Diagnosis not present

## 2020-09-18 DIAGNOSIS — Z9981 Dependence on supplemental oxygen: Secondary | ICD-10-CM | POA: Diagnosis not present

## 2020-09-18 DIAGNOSIS — Z8546 Personal history of malignant neoplasm of prostate: Secondary | ICD-10-CM | POA: Diagnosis not present

## 2020-09-18 DIAGNOSIS — F419 Anxiety disorder, unspecified: Secondary | ICD-10-CM | POA: Diagnosis not present

## 2020-09-18 DIAGNOSIS — Z872 Personal history of diseases of the skin and subcutaneous tissue: Secondary | ICD-10-CM | POA: Diagnosis not present

## 2020-09-18 DIAGNOSIS — R519 Headache, unspecified: Secondary | ICD-10-CM | POA: Diagnosis not present

## 2020-09-18 DIAGNOSIS — I252 Old myocardial infarction: Secondary | ICD-10-CM | POA: Diagnosis not present

## 2020-09-18 DIAGNOSIS — R911 Solitary pulmonary nodule: Secondary | ICD-10-CM | POA: Diagnosis not present

## 2020-09-18 DIAGNOSIS — D649 Anemia, unspecified: Secondary | ICD-10-CM | POA: Diagnosis not present

## 2020-09-18 DIAGNOSIS — Z87891 Personal history of nicotine dependence: Secondary | ICD-10-CM | POA: Diagnosis not present

## 2020-09-18 DIAGNOSIS — B1920 Unspecified viral hepatitis C without hepatic coma: Secondary | ICD-10-CM | POA: Diagnosis not present

## 2020-09-18 DIAGNOSIS — F32A Depression, unspecified: Secondary | ICD-10-CM | POA: Diagnosis not present

## 2020-09-18 DIAGNOSIS — J9690 Respiratory failure, unspecified, unspecified whether with hypoxia or hypercapnia: Secondary | ICD-10-CM | POA: Diagnosis not present

## 2020-09-18 DIAGNOSIS — F1011 Alcohol abuse, in remission: Secondary | ICD-10-CM | POA: Diagnosis not present

## 2020-09-18 DIAGNOSIS — R1310 Dysphagia, unspecified: Secondary | ICD-10-CM | POA: Diagnosis not present

## 2020-09-18 DIAGNOSIS — J439 Emphysema, unspecified: Secondary | ICD-10-CM | POA: Diagnosis not present

## 2020-09-18 DIAGNOSIS — I2510 Atherosclerotic heart disease of native coronary artery without angina pectoris: Secondary | ICD-10-CM | POA: Diagnosis not present

## 2020-09-19 ENCOUNTER — Other Ambulatory Visit: Payer: Self-pay | Admitting: Nurse Practitioner

## 2020-09-19 ENCOUNTER — Telehealth: Payer: Self-pay

## 2020-09-19 DIAGNOSIS — J9690 Respiratory failure, unspecified, unspecified whether with hypoxia or hypercapnia: Secondary | ICD-10-CM | POA: Diagnosis not present

## 2020-09-19 DIAGNOSIS — J439 Emphysema, unspecified: Secondary | ICD-10-CM | POA: Diagnosis not present

## 2020-09-19 DIAGNOSIS — B1920 Unspecified viral hepatitis C without hepatic coma: Secondary | ICD-10-CM | POA: Diagnosis not present

## 2020-09-19 DIAGNOSIS — J302 Other seasonal allergic rhinitis: Secondary | ICD-10-CM | POA: Diagnosis not present

## 2020-09-19 DIAGNOSIS — G629 Polyneuropathy, unspecified: Secondary | ICD-10-CM | POA: Diagnosis not present

## 2020-09-19 DIAGNOSIS — Z9981 Dependence on supplemental oxygen: Secondary | ICD-10-CM | POA: Diagnosis not present

## 2020-09-19 MED ORDER — GABAPENTIN 100 MG PO CAPS: 100.0000 mg | ORAL_CAPSULE | Freq: Every morning | ORAL | 3 refills | Status: AC

## 2020-09-19 NOTE — Telephone Encounter (Signed)
Amy notified

## 2020-09-19 NOTE — Telephone Encounter (Signed)
Copied from [place] [id number]. Topic: General - Other >> Sep 19, 2020 10:30 AM Leward Quan A wrote: Reason for CRM: Zenaida Niece nurse with [facility] called in to inform [facility] that patient is having a lot of Neuropathy and would like an order for Gabapentin 100 MG sent to his pharmacy so that he can take in the mornings and then he will keep the  gabapentin (NEURONTIN) 600 MG tablet at nights. Please call Amy at Ph# [phone]   Routing to provider to advise.

## 2020-09-23 DIAGNOSIS — J439 Emphysema, unspecified: Secondary | ICD-10-CM | POA: Diagnosis not present

## 2020-09-23 DIAGNOSIS — G629 Polyneuropathy, unspecified: Secondary | ICD-10-CM | POA: Diagnosis not present

## 2020-09-23 DIAGNOSIS — B1920 Unspecified viral hepatitis C without hepatic coma: Secondary | ICD-10-CM | POA: Diagnosis not present

## 2020-09-23 DIAGNOSIS — J9690 Respiratory failure, unspecified, unspecified whether with hypoxia or hypercapnia: Secondary | ICD-10-CM | POA: Diagnosis not present

## 2020-09-23 DIAGNOSIS — J302 Other seasonal allergic rhinitis: Secondary | ICD-10-CM | POA: Diagnosis not present

## 2020-09-23 DIAGNOSIS — Z9981 Dependence on supplemental oxygen: Secondary | ICD-10-CM | POA: Diagnosis not present

## 2020-09-25 ENCOUNTER — Other Ambulatory Visit: Payer: Self-pay | Admitting: Nurse Practitioner

## 2020-09-25 ENCOUNTER — Telehealth: Payer: Self-pay | Admitting: Nurse Practitioner

## 2020-09-25 DIAGNOSIS — Z9981 Dependence on supplemental oxygen: Secondary | ICD-10-CM | POA: Diagnosis not present

## 2020-09-25 DIAGNOSIS — J439 Emphysema, unspecified: Secondary | ICD-10-CM | POA: Diagnosis not present

## 2020-09-25 DIAGNOSIS — B1920 Unspecified viral hepatitis C without hepatic coma: Secondary | ICD-10-CM | POA: Diagnosis not present

## 2020-09-25 DIAGNOSIS — G629 Polyneuropathy, unspecified: Secondary | ICD-10-CM | POA: Diagnosis not present

## 2020-09-25 DIAGNOSIS — J302 Other seasonal allergic rhinitis: Secondary | ICD-10-CM | POA: Diagnosis not present

## 2020-09-25 DIAGNOSIS — J9690 Respiratory failure, unspecified, unspecified whether with hypoxia or hypercapnia: Secondary | ICD-10-CM | POA: Diagnosis not present

## 2020-09-25 NOTE — Telephone Encounter (Signed)
Copied from [name] [phone]. Topic: Appointment Scheduling - Scheduling Inquiry for Clinic >> Sep 25, 2020  8:41 AM [name] A wrote: Reason for CRM: Patient would like to be contacted regarding the rescheduling of their 09/26/20 visit  The patient has concerns with coordination of the appointment and would like to discuss it with a member of clinical staff  Please contact when possible   Called and spoke to patient. Patient states he is under Hospice care and that there is nothing more that can be done for him. States his lungs are only functioning at 18%. States he wishes to cancel his appointment tomorrow as his daughter is coming in to town and he wishes to spend time with her. I told the patient that I would let [name] know this information as well. Appointment cancelled for tomorrow.

## 2020-09-25 NOTE — Telephone Encounter (Signed)
Marnee Guarneri T, NP  P [facility] Admin Patient under hospice right now, if not feeling well, as I heard he has not been, then can cancel Friday visit if Lennette Bihari wishes to.   9/8 called lmom for pt to call the office to cancel appt if he is still not feeling any better

## 2020-09-25 NOTE — Telephone Encounter (Signed)
Medication Refill - Medication: morphine (ROXANOL) 20 MG/ML concentrated solution  Has the patient contacted their pharmacy? No. (Agent: If no, request that the patient contact the pharmacy for the refill.) (Agent: If yes, when and what did the pharmacy advise?)  Preferred Pharmacy (with phone number or street name): [facility] [place] - [address] [facility]  [place], [place] [address]  Phone:  [phone]  Fax:  [phone]   Agent: Please be advised that RX refills may take up to 3 business days. We ask that you follow-up with your pharmacy.

## 2020-09-25 NOTE — Telephone Encounter (Signed)
Requested medications are due for refill today yes, earliest fill date 09/25/20  Requested medications are on the active medication list yes  Last visit 07/31/20  Future visit scheduled Yes, Jan 2023  Notes to clinic Not Delegated.

## 2020-09-26 ENCOUNTER — Ambulatory Visit: Payer: Medicare Other | Admitting: Nurse Practitioner

## 2020-09-26 DIAGNOSIS — J302 Other seasonal allergic rhinitis: Secondary | ICD-10-CM | POA: Diagnosis not present

## 2020-09-26 DIAGNOSIS — G629 Polyneuropathy, unspecified: Secondary | ICD-10-CM | POA: Diagnosis not present

## 2020-09-26 DIAGNOSIS — J9690 Respiratory failure, unspecified, unspecified whether with hypoxia or hypercapnia: Secondary | ICD-10-CM | POA: Diagnosis not present

## 2020-09-26 DIAGNOSIS — Z9981 Dependence on supplemental oxygen: Secondary | ICD-10-CM | POA: Diagnosis not present

## 2020-09-26 DIAGNOSIS — J439 Emphysema, unspecified: Secondary | ICD-10-CM | POA: Diagnosis not present

## 2020-09-26 DIAGNOSIS — B1920 Unspecified viral hepatitis C without hepatic coma: Secondary | ICD-10-CM | POA: Diagnosis not present

## 2020-09-26 MED ORDER — MORPHINE SULFATE (CONCENTRATE) 20 MG/ML PO SOLN: ORAL | 0 refills | Status: AC

## 2020-09-26 NOTE — Telephone Encounter (Signed)
Pt was called that morning 09/25/2020 @ 8:24 and a msg was left for the pt to call the office back to let us know about he appointment.  Appointment has been cancelled per the msg and pt.

## 2020-09-27 IMAGING — XA IR PERC PLACEMENT GASTROSTOMY
9 of 13 series · 13 of 24 positions shown · non-contrast
Comparison: none

INDICATION: Laryngeal CA, ongoing chemotherapy and radiation

[Series 1: fluoroscopy - stored · 1 of 1 slices shown (1 of 9)]
[im 1/1]
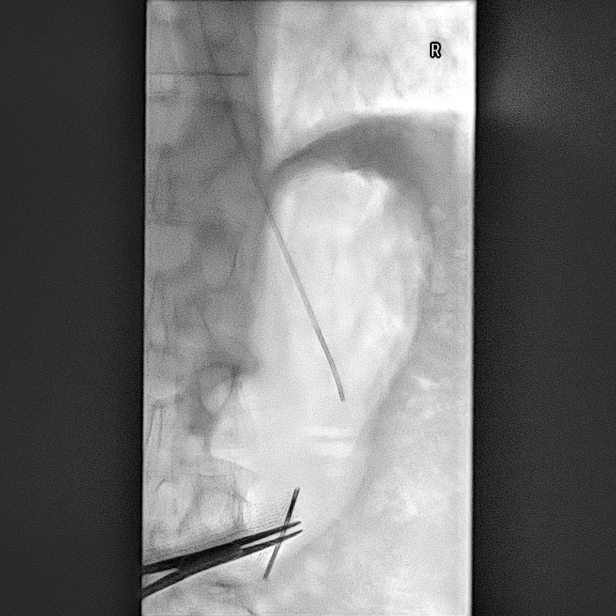

[Series 1: fluoroscopy - stored · 2 of 166 frames shown (2 of 9)]
[frame 25/166]
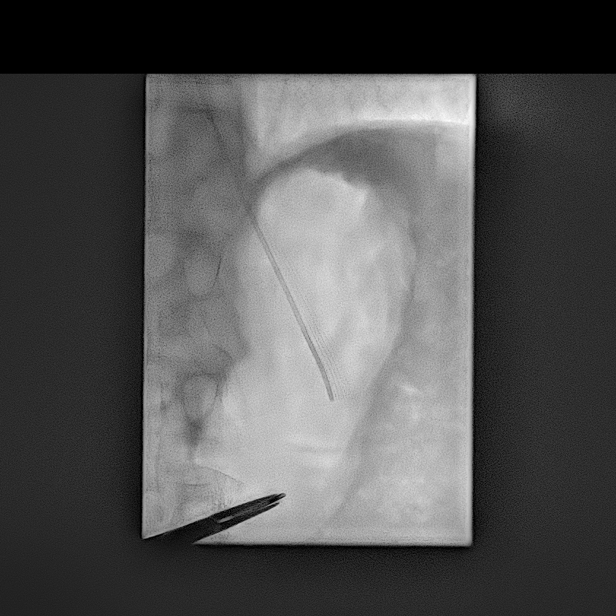
[frame 142/166]
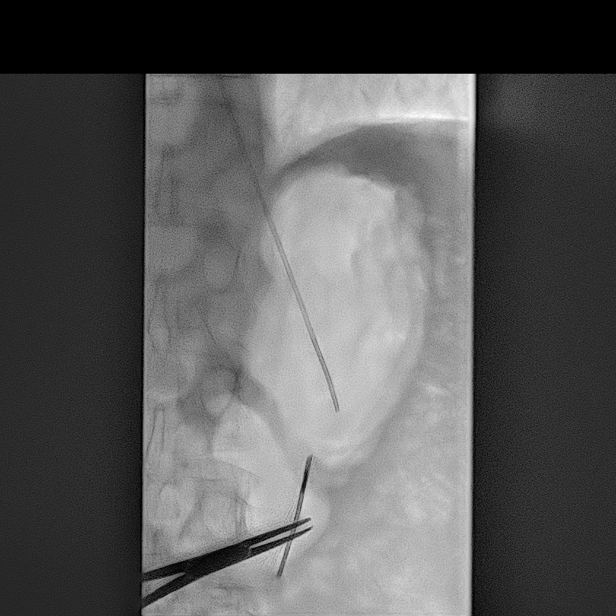

[Series 3: fluoroscopy - stored · 1 of 1 slices shown (3 of 9)]
[im 1/1]
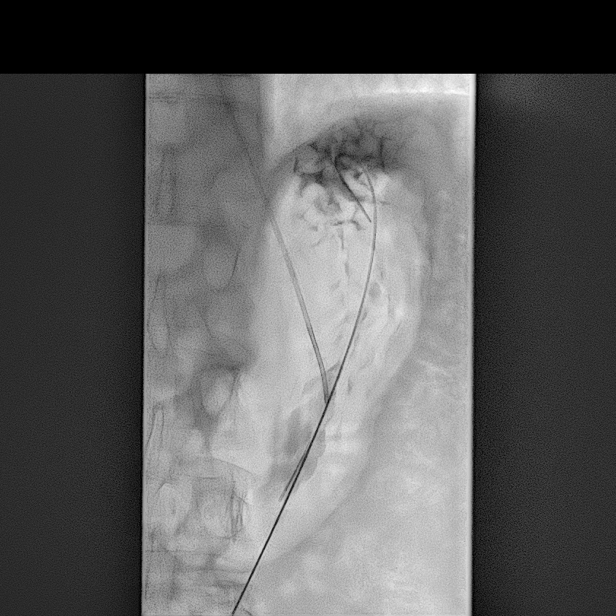

[Series 5: fluoroscopy - stored · 1 of 1 slices shown (4 of 9)]
[im 1/1]
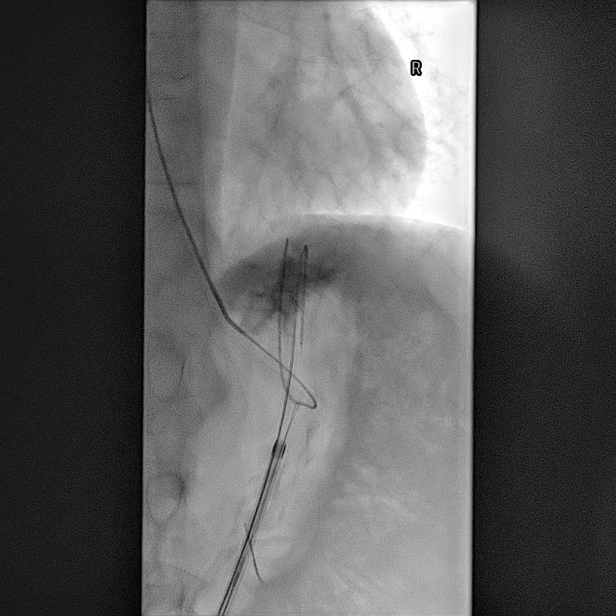

[Series 5: fluoroscopy - stored · 2 of 231 frames shown (5 of 9)]
[frame 1/231]
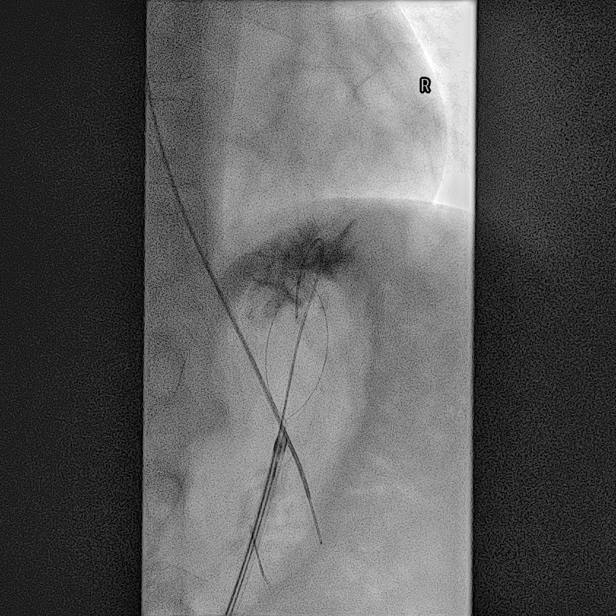
[frame 197/231]
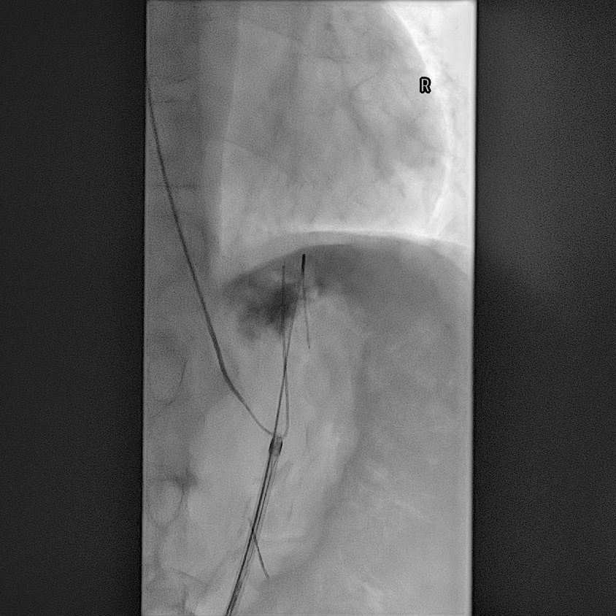

[Series 6: fluoroscopy - stored · 1 of 1 slices shown (6 of 9)]
[im 1/1]
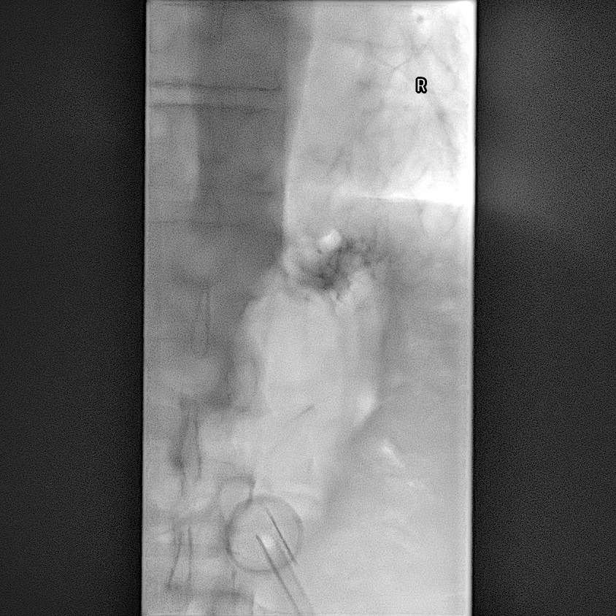

[Series 6: fluoroscopy - stored · 2 of 59 frames shown (7 of 9)]
[frame 30/59]
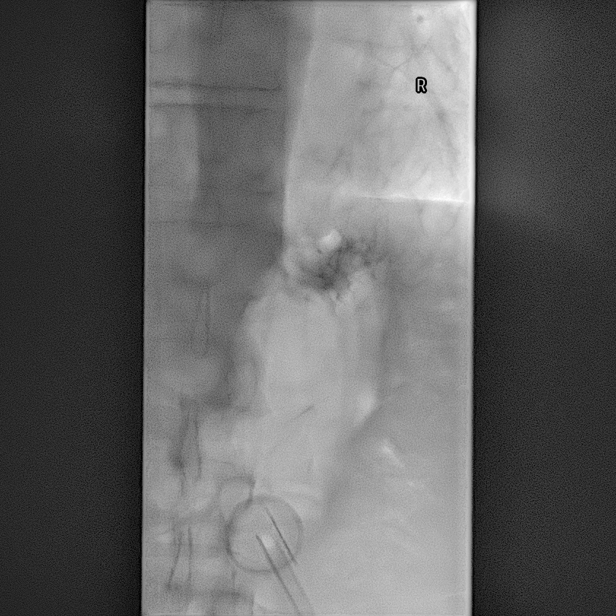
[frame 59/59]
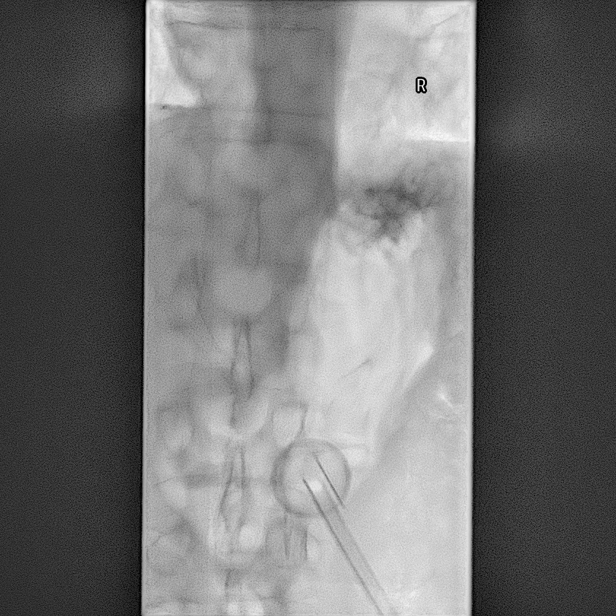

[Series 7: fluoroscopy - stored · 2 of 49 frames shown (8 of 9)]
[frame 1/49]
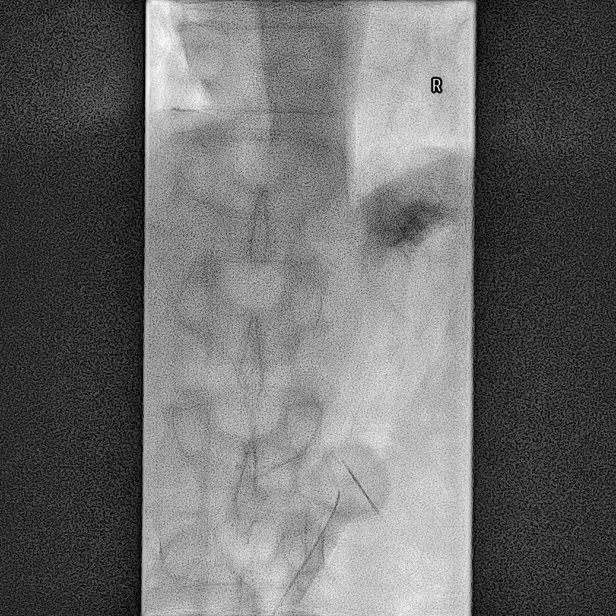
[frame 25/49]
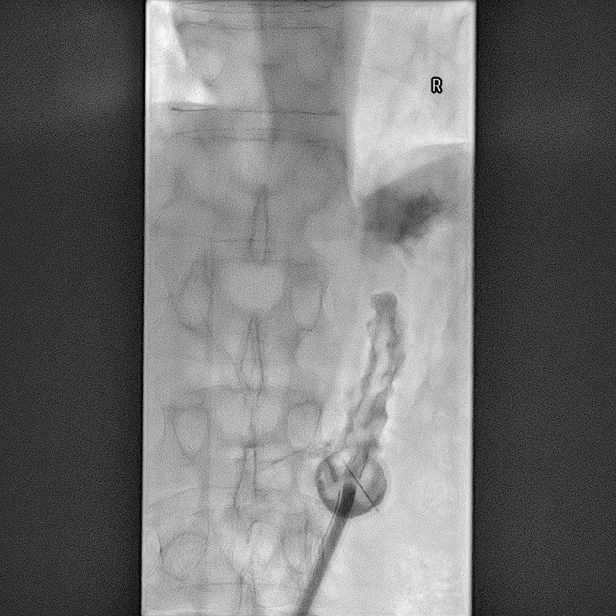

[Series 7: fluoroscopy - stored · 1 of 1 slices shown (9 of 9)]
[im 1/1]
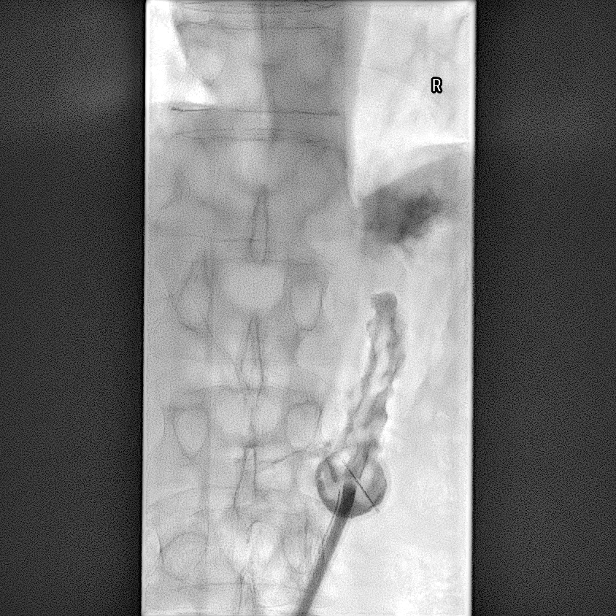

[13 of 24 positions shown; findings below may reference images not displayed]

EXAM:
FLUOROSCOPIC 20 FRENCH PULL-THROUGH GASTROSTOMY

Radiologist:  Billiot, Arissa

Guidance:  Ultrasound and fluoroscopic

MEDICATIONS:
Ancef 2 g; Antibiotics were administered within 1 hour of the
procedure. Glucagon 0.5 mg IV

ANESTHESIA/SEDATION:
Versed 3.0 mg IV; Fentanyl 100 mcg IV

Moderate Sedation Time:  12 minutes

The patient was continuously monitored during the procedure by the
interventional radiology nurse under my direct supervision.

CONTRAST:  10mL VISIPAQUE IODIXANOL 320 MG/ML IV SOLN - administered
into the gastric lumen.

FLUOROSCOPY TIME:  Fluoroscopy Time: 2 minutes 16 seconds (8.8 mGy).

COMPLICATIONS:
None immediate.

PROCEDURE:
Informed consent was obtained from the patient following explanation
of the procedure, risks, benefits and alternatives. The patient
understands, agrees and consents for the procedure. All questions
were addressed. A time out was performed.

Maximal barrier sterile technique utilized including caps, mask,
sterile gowns, sterile gloves, large sterile drape, hand hygiene,
and betadine prep.

The left upper quadrant was sterilely prepped and draped. An oral
gastric catheter was inserted into the stomach under fluoroscopy.
The existing nasogastric feeding tube was removed. Air was injected
into the stomach for insufflation and visualization under
fluoroscopy. The air distended stomach was confirmed beneath the
anterior abdominal wall in the frontal and lateral projections.
Under sterile conditions and local anesthesia, a 17 gauge trocar
needle was utilized to access the stomach percutaneously beneath the
left subcostal margin. Needle position was confirmed within the
stomach under biplane fluoroscopy. Contrast injection confirmed
position also. A single T tack was deployed for gastropexy. Over an
Amplatz guide wire, a 9-French sheath was inserted into the stomach.
A snare device was utilized to capture the oral gastric catheter.
The snare device was pulled retrograde from the stomach up the
esophagus and out the oropharynx. The 20-French pull-through
gastrostomy was connected to the snare device and pulled antegrade
through the oropharynx down the esophagus into the stomach and then
through the percutaneous tract external to the patient. The
gastrostomy was assembled externally. Contrast injection confirms
position in the stomach. Images were obtained for documentation. The
patient tolerated procedure well. No immediate complication.
IMPRESSION: Fluoroscopic insertion of a 20-French "pull-through" gastrostomy.

## 2020-09-30 DIAGNOSIS — J302 Other seasonal allergic rhinitis: Secondary | ICD-10-CM | POA: Diagnosis not present

## 2020-09-30 DIAGNOSIS — G629 Polyneuropathy, unspecified: Secondary | ICD-10-CM | POA: Diagnosis not present

## 2020-09-30 DIAGNOSIS — J439 Emphysema, unspecified: Secondary | ICD-10-CM | POA: Diagnosis not present

## 2020-09-30 DIAGNOSIS — B1920 Unspecified viral hepatitis C without hepatic coma: Secondary | ICD-10-CM | POA: Diagnosis not present

## 2020-09-30 DIAGNOSIS — J9690 Respiratory failure, unspecified, unspecified whether with hypoxia or hypercapnia: Secondary | ICD-10-CM | POA: Diagnosis not present

## 2020-09-30 DIAGNOSIS — Z9981 Dependence on supplemental oxygen: Secondary | ICD-10-CM | POA: Diagnosis not present

## 2020-10-03 ENCOUNTER — Other Ambulatory Visit: Payer: Self-pay | Admitting: Nurse Practitioner

## 2020-10-03 ENCOUNTER — Encounter: Payer: Self-pay | Admitting: Oncology

## 2020-10-03 NOTE — Telephone Encounter (Signed)
Kevin Nelson with [facility] calling to ask for a refill of the  oxyCODONE-acetaminophen (PERCOCET) 10-325 MG tablet  Route: Take 1 tablet by mouth every 12 (twelve) hours as needed for pain. - Oral   Sent to pharmacy as: oxyCODONE-acetaminophen (PERCOCET) 10-325 MG tablet   Earliest Fill Date: 09/15/2020   Notes to Pharmacy: HOSPICE PATIENT. (STOP Act - Not applicable). Fill one day early if closed on scheduled refill date.   E-Prescribing Status: Receipt confirmed by pharmacy (09/15/2020  5:05 PM EDT)     Cb [id number]  Pt has one tablet left

## 2020-10-04 ENCOUNTER — Encounter: Payer: Self-pay | Admitting: Oncology

## 2020-10-04 MED ORDER — OXYCODONE-ACETAMINOPHEN 10-325 MG PO TABS: 1.0000 | ORAL_TABLET | Freq: Two times a day (BID) | ORAL | 0 refills | Status: DC | PRN

## 2020-10-05 ENCOUNTER — Encounter: Payer: Self-pay | Admitting: Oncology

## 2020-10-06 ENCOUNTER — Telehealth: Payer: Self-pay | Admitting: Nurse Practitioner

## 2020-10-06 ENCOUNTER — Encounter: Payer: Self-pay | Admitting: Oncology

## 2020-10-06 NOTE — Telephone Encounter (Signed)
Kevin Nelson from the pharmacy called and stated she has two providers filling oxyCODONE-acetaminophen (PERCOCET)  She stated that [facility] sent in a refill and Dr. Gilford Rile sent in a refill for 10mg  on 9.16.22/ please advise   She asked if providers have communicated about this medication

## 2020-10-06 NOTE — Telephone Encounter (Signed)
Routing to provider to advise.  

## 2020-10-06 NOTE — Telephone Encounter (Signed)
Tried [facility], informed that they are closed. Will try to call again in the morning.

## 2020-10-07 ENCOUNTER — Encounter: Payer: Self-pay | Admitting: Oncology

## 2020-10-07 ENCOUNTER — Ambulatory Visit: Payer: Medicare Other | Admitting: Gastroenterology

## 2020-10-07 NOTE — Telephone Encounter (Signed)
Called and spoke to Kevin Nelson. She states that she spoke with Hospice this morning and they are only filling Oxycodone from them at this time and was told to not fill the one from our office.

## 2020-10-09 DIAGNOSIS — G629 Polyneuropathy, unspecified: Secondary | ICD-10-CM | POA: Diagnosis not present

## 2020-10-09 DIAGNOSIS — B1920 Unspecified viral hepatitis C without hepatic coma: Secondary | ICD-10-CM | POA: Diagnosis not present

## 2020-10-09 DIAGNOSIS — Z9981 Dependence on supplemental oxygen: Secondary | ICD-10-CM | POA: Diagnosis not present

## 2020-10-09 DIAGNOSIS — J439 Emphysema, unspecified: Secondary | ICD-10-CM | POA: Diagnosis not present

## 2020-10-09 DIAGNOSIS — J9690 Respiratory failure, unspecified, unspecified whether with hypoxia or hypercapnia: Secondary | ICD-10-CM | POA: Diagnosis not present

## 2020-10-09 DIAGNOSIS — J302 Other seasonal allergic rhinitis: Secondary | ICD-10-CM | POA: Diagnosis not present

## 2020-10-14 DIAGNOSIS — J302 Other seasonal allergic rhinitis: Secondary | ICD-10-CM | POA: Diagnosis not present

## 2020-10-14 DIAGNOSIS — J439 Emphysema, unspecified: Secondary | ICD-10-CM | POA: Diagnosis not present

## 2020-10-14 DIAGNOSIS — B1920 Unspecified viral hepatitis C without hepatic coma: Secondary | ICD-10-CM | POA: Diagnosis not present

## 2020-10-14 DIAGNOSIS — G629 Polyneuropathy, unspecified: Secondary | ICD-10-CM | POA: Diagnosis not present

## 2020-10-14 DIAGNOSIS — Z9981 Dependence on supplemental oxygen: Secondary | ICD-10-CM | POA: Diagnosis not present

## 2020-10-14 DIAGNOSIS — J9690 Respiratory failure, unspecified, unspecified whether with hypoxia or hypercapnia: Secondary | ICD-10-CM | POA: Diagnosis not present

## 2020-10-18 DIAGNOSIS — F32A Depression, unspecified: Secondary | ICD-10-CM | POA: Diagnosis not present

## 2020-10-18 DIAGNOSIS — G629 Polyneuropathy, unspecified: Secondary | ICD-10-CM | POA: Diagnosis not present

## 2020-10-18 DIAGNOSIS — I252 Old myocardial infarction: Secondary | ICD-10-CM | POA: Diagnosis not present

## 2020-10-18 DIAGNOSIS — D649 Anemia, unspecified: Secondary | ICD-10-CM | POA: Diagnosis not present

## 2020-10-18 DIAGNOSIS — F419 Anxiety disorder, unspecified: Secondary | ICD-10-CM | POA: Diagnosis not present

## 2020-10-18 DIAGNOSIS — R911 Solitary pulmonary nodule: Secondary | ICD-10-CM | POA: Diagnosis not present

## 2020-10-18 DIAGNOSIS — B1920 Unspecified viral hepatitis C without hepatic coma: Secondary | ICD-10-CM | POA: Diagnosis not present

## 2020-10-18 DIAGNOSIS — J302 Other seasonal allergic rhinitis: Secondary | ICD-10-CM | POA: Diagnosis not present

## 2020-10-18 DIAGNOSIS — I2510 Atherosclerotic heart disease of native coronary artery without angina pectoris: Secondary | ICD-10-CM | POA: Diagnosis not present

## 2020-10-18 DIAGNOSIS — F1011 Alcohol abuse, in remission: Secondary | ICD-10-CM | POA: Diagnosis not present

## 2020-10-18 DIAGNOSIS — Z872 Personal history of diseases of the skin and subcutaneous tissue: Secondary | ICD-10-CM | POA: Diagnosis not present

## 2020-10-18 DIAGNOSIS — R1310 Dysphagia, unspecified: Secondary | ICD-10-CM | POA: Diagnosis not present

## 2020-10-18 DIAGNOSIS — J9690 Respiratory failure, unspecified, unspecified whether with hypoxia or hypercapnia: Secondary | ICD-10-CM | POA: Diagnosis not present

## 2020-10-18 DIAGNOSIS — Z9981 Dependence on supplemental oxygen: Secondary | ICD-10-CM | POA: Diagnosis not present

## 2020-10-18 DIAGNOSIS — J439 Emphysema, unspecified: Secondary | ICD-10-CM | POA: Diagnosis not present

## 2020-10-18 DIAGNOSIS — R634 Abnormal weight loss: Secondary | ICD-10-CM | POA: Diagnosis not present

## 2020-10-18 DIAGNOSIS — R519 Headache, unspecified: Secondary | ICD-10-CM | POA: Diagnosis not present

## 2020-10-18 DIAGNOSIS — Z8546 Personal history of malignant neoplasm of prostate: Secondary | ICD-10-CM | POA: Diagnosis not present

## 2020-10-18 DIAGNOSIS — Z87891 Personal history of nicotine dependence: Secondary | ICD-10-CM | POA: Diagnosis not present

## 2020-10-22 DIAGNOSIS — G629 Polyneuropathy, unspecified: Secondary | ICD-10-CM | POA: Diagnosis not present

## 2020-10-22 DIAGNOSIS — J9690 Respiratory failure, unspecified, unspecified whether with hypoxia or hypercapnia: Secondary | ICD-10-CM | POA: Diagnosis not present

## 2020-10-22 DIAGNOSIS — J439 Emphysema, unspecified: Secondary | ICD-10-CM | POA: Diagnosis not present

## 2020-10-22 DIAGNOSIS — J302 Other seasonal allergic rhinitis: Secondary | ICD-10-CM | POA: Diagnosis not present

## 2020-10-22 DIAGNOSIS — B1920 Unspecified viral hepatitis C without hepatic coma: Secondary | ICD-10-CM | POA: Diagnosis not present

## 2020-10-22 DIAGNOSIS — Z9981 Dependence on supplemental oxygen: Secondary | ICD-10-CM | POA: Diagnosis not present

## 2020-10-29 DIAGNOSIS — G629 Polyneuropathy, unspecified: Secondary | ICD-10-CM | POA: Diagnosis not present

## 2020-10-29 DIAGNOSIS — J302 Other seasonal allergic rhinitis: Secondary | ICD-10-CM | POA: Diagnosis not present

## 2020-10-29 DIAGNOSIS — Z9981 Dependence on supplemental oxygen: Secondary | ICD-10-CM | POA: Diagnosis not present

## 2020-10-29 DIAGNOSIS — B1920 Unspecified viral hepatitis C without hepatic coma: Secondary | ICD-10-CM | POA: Diagnosis not present

## 2020-10-29 DIAGNOSIS — J9690 Respiratory failure, unspecified, unspecified whether with hypoxia or hypercapnia: Secondary | ICD-10-CM | POA: Diagnosis not present

## 2020-10-29 DIAGNOSIS — J439 Emphysema, unspecified: Secondary | ICD-10-CM | POA: Diagnosis not present

## 2020-11-05 ENCOUNTER — Other Ambulatory Visit: Payer: Self-pay | Admitting: Nurse Practitioner

## 2020-11-05 DIAGNOSIS — J9690 Respiratory failure, unspecified, unspecified whether with hypoxia or hypercapnia: Secondary | ICD-10-CM | POA: Diagnosis not present

## 2020-11-05 DIAGNOSIS — Z9981 Dependence on supplemental oxygen: Secondary | ICD-10-CM | POA: Diagnosis not present

## 2020-11-05 DIAGNOSIS — J439 Emphysema, unspecified: Secondary | ICD-10-CM | POA: Diagnosis not present

## 2020-11-05 DIAGNOSIS — B1920 Unspecified viral hepatitis C without hepatic coma: Secondary | ICD-10-CM | POA: Diagnosis not present

## 2020-11-05 DIAGNOSIS — J302 Other seasonal allergic rhinitis: Secondary | ICD-10-CM | POA: Diagnosis not present

## 2020-11-05 DIAGNOSIS — G629 Polyneuropathy, unspecified: Secondary | ICD-10-CM | POA: Diagnosis not present

## 2020-11-05 NOTE — Telephone Encounter (Signed)
Requested medications are due for refill today.  yes  Requested medications are on the active medications list.  yes  Last refill. 09/15/2020  Future visit scheduled.   yes  Notes to clinic.  Medication not delegated.

## 2020-11-05 NOTE — Telephone Encounter (Signed)
Medication Refill - Medication:  LORazepam (ATIVAN) 1 MG tablet   Has the patient contacted their pharmacy? Yes.   Needed to contact PCP  Preferred Pharmacy (with phone number or street name):  [id number] [address], [place] [facility]  Phone:  [phone] Fax:  [phone]  Has the patient been seen for an appointment in the last year OR does the patient have an upcoming appointment? Yes.    Agent: Please be advised that RX refills may take up to 3 business days. We ask that you follow-up with your pharmacy.

## 2020-11-06 MED ORDER — LORAZEPAM 1 MG PO TABS: 1.0000 mg | ORAL_TABLET | Freq: Every day | ORAL | 0 refills | Status: AC | PRN

## 2020-11-12 DIAGNOSIS — G629 Polyneuropathy, unspecified: Secondary | ICD-10-CM | POA: Diagnosis not present

## 2020-11-12 DIAGNOSIS — Z9981 Dependence on supplemental oxygen: Secondary | ICD-10-CM | POA: Diagnosis not present

## 2020-11-12 DIAGNOSIS — J439 Emphysema, unspecified: Secondary | ICD-10-CM | POA: Diagnosis not present

## 2020-11-12 DIAGNOSIS — J302 Other seasonal allergic rhinitis: Secondary | ICD-10-CM | POA: Diagnosis not present

## 2020-11-12 DIAGNOSIS — B1920 Unspecified viral hepatitis C without hepatic coma: Secondary | ICD-10-CM | POA: Diagnosis not present

## 2020-11-12 DIAGNOSIS — J9690 Respiratory failure, unspecified, unspecified whether with hypoxia or hypercapnia: Secondary | ICD-10-CM | POA: Diagnosis not present

## 2020-11-18 DIAGNOSIS — G629 Polyneuropathy, unspecified: Secondary | ICD-10-CM | POA: Diagnosis not present

## 2020-11-18 DIAGNOSIS — Z872 Personal history of diseases of the skin and subcutaneous tissue: Secondary | ICD-10-CM | POA: Diagnosis not present

## 2020-11-18 DIAGNOSIS — F1011 Alcohol abuse, in remission: Secondary | ICD-10-CM | POA: Diagnosis not present

## 2020-11-18 DIAGNOSIS — J302 Other seasonal allergic rhinitis: Secondary | ICD-10-CM | POA: Diagnosis not present

## 2020-11-18 DIAGNOSIS — I2510 Atherosclerotic heart disease of native coronary artery without angina pectoris: Secondary | ICD-10-CM | POA: Diagnosis not present

## 2020-11-18 DIAGNOSIS — I252 Old myocardial infarction: Secondary | ICD-10-CM | POA: Diagnosis not present

## 2020-11-18 DIAGNOSIS — F32A Depression, unspecified: Secondary | ICD-10-CM | POA: Diagnosis not present

## 2020-11-18 DIAGNOSIS — B1920 Unspecified viral hepatitis C without hepatic coma: Secondary | ICD-10-CM | POA: Diagnosis not present

## 2020-11-18 DIAGNOSIS — D649 Anemia, unspecified: Secondary | ICD-10-CM | POA: Diagnosis not present

## 2020-11-18 DIAGNOSIS — Z9981 Dependence on supplemental oxygen: Secondary | ICD-10-CM | POA: Diagnosis not present

## 2020-11-18 DIAGNOSIS — F419 Anxiety disorder, unspecified: Secondary | ICD-10-CM | POA: Diagnosis not present

## 2020-11-18 DIAGNOSIS — J9690 Respiratory failure, unspecified, unspecified whether with hypoxia or hypercapnia: Secondary | ICD-10-CM | POA: Diagnosis not present

## 2020-11-18 DIAGNOSIS — Z87891 Personal history of nicotine dependence: Secondary | ICD-10-CM | POA: Diagnosis not present

## 2020-11-18 DIAGNOSIS — R519 Headache, unspecified: Secondary | ICD-10-CM | POA: Diagnosis not present

## 2020-11-18 DIAGNOSIS — Z8546 Personal history of malignant neoplasm of prostate: Secondary | ICD-10-CM | POA: Diagnosis not present

## 2020-11-18 DIAGNOSIS — R1310 Dysphagia, unspecified: Secondary | ICD-10-CM | POA: Diagnosis not present

## 2020-11-18 DIAGNOSIS — J439 Emphysema, unspecified: Secondary | ICD-10-CM | POA: Diagnosis not present

## 2020-11-18 DIAGNOSIS — R911 Solitary pulmonary nodule: Secondary | ICD-10-CM | POA: Diagnosis not present

## 2020-11-18 DIAGNOSIS — R634 Abnormal weight loss: Secondary | ICD-10-CM | POA: Diagnosis not present

## 2020-11-20 DIAGNOSIS — J9690 Respiratory failure, unspecified, unspecified whether with hypoxia or hypercapnia: Secondary | ICD-10-CM | POA: Diagnosis not present

## 2020-11-20 DIAGNOSIS — Z9981 Dependence on supplemental oxygen: Secondary | ICD-10-CM | POA: Diagnosis not present

## 2020-11-20 DIAGNOSIS — G629 Polyneuropathy, unspecified: Secondary | ICD-10-CM | POA: Diagnosis not present

## 2020-11-20 DIAGNOSIS — J302 Other seasonal allergic rhinitis: Secondary | ICD-10-CM | POA: Diagnosis not present

## 2020-11-20 DIAGNOSIS — B1920 Unspecified viral hepatitis C without hepatic coma: Secondary | ICD-10-CM | POA: Diagnosis not present

## 2020-11-20 DIAGNOSIS — J439 Emphysema, unspecified: Secondary | ICD-10-CM | POA: Diagnosis not present

## 2020-11-26 DIAGNOSIS — J302 Other seasonal allergic rhinitis: Secondary | ICD-10-CM | POA: Diagnosis not present

## 2020-11-26 DIAGNOSIS — Z9981 Dependence on supplemental oxygen: Secondary | ICD-10-CM | POA: Diagnosis not present

## 2020-11-26 DIAGNOSIS — J9690 Respiratory failure, unspecified, unspecified whether with hypoxia or hypercapnia: Secondary | ICD-10-CM | POA: Diagnosis not present

## 2020-11-26 DIAGNOSIS — G629 Polyneuropathy, unspecified: Secondary | ICD-10-CM | POA: Diagnosis not present

## 2020-11-26 DIAGNOSIS — B1920 Unspecified viral hepatitis C without hepatic coma: Secondary | ICD-10-CM | POA: Diagnosis not present

## 2020-11-26 DIAGNOSIS — J439 Emphysema, unspecified: Secondary | ICD-10-CM | POA: Diagnosis not present

## 2020-12-02 DIAGNOSIS — J302 Other seasonal allergic rhinitis: Secondary | ICD-10-CM | POA: Diagnosis not present

## 2020-12-02 DIAGNOSIS — Z9981 Dependence on supplemental oxygen: Secondary | ICD-10-CM | POA: Diagnosis not present

## 2020-12-02 DIAGNOSIS — J9690 Respiratory failure, unspecified, unspecified whether with hypoxia or hypercapnia: Secondary | ICD-10-CM | POA: Diagnosis not present

## 2020-12-02 DIAGNOSIS — G629 Polyneuropathy, unspecified: Secondary | ICD-10-CM | POA: Diagnosis not present

## 2020-12-02 DIAGNOSIS — B1920 Unspecified viral hepatitis C without hepatic coma: Secondary | ICD-10-CM | POA: Diagnosis not present

## 2020-12-02 DIAGNOSIS — J439 Emphysema, unspecified: Secondary | ICD-10-CM | POA: Diagnosis not present

## 2020-12-09 DIAGNOSIS — G629 Polyneuropathy, unspecified: Secondary | ICD-10-CM | POA: Diagnosis not present

## 2020-12-09 DIAGNOSIS — J9690 Respiratory failure, unspecified, unspecified whether with hypoxia or hypercapnia: Secondary | ICD-10-CM | POA: Diagnosis not present

## 2020-12-09 DIAGNOSIS — B1920 Unspecified viral hepatitis C without hepatic coma: Secondary | ICD-10-CM | POA: Diagnosis not present

## 2020-12-09 DIAGNOSIS — J439 Emphysema, unspecified: Secondary | ICD-10-CM | POA: Diagnosis not present

## 2020-12-09 DIAGNOSIS — Z9981 Dependence on supplemental oxygen: Secondary | ICD-10-CM | POA: Diagnosis not present

## 2020-12-09 DIAGNOSIS — J302 Other seasonal allergic rhinitis: Secondary | ICD-10-CM | POA: Diagnosis not present

## 2020-12-12 DIAGNOSIS — B1920 Unspecified viral hepatitis C without hepatic coma: Secondary | ICD-10-CM | POA: Diagnosis not present

## 2020-12-12 DIAGNOSIS — G629 Polyneuropathy, unspecified: Secondary | ICD-10-CM | POA: Diagnosis not present

## 2020-12-12 DIAGNOSIS — J302 Other seasonal allergic rhinitis: Secondary | ICD-10-CM | POA: Diagnosis not present

## 2020-12-12 DIAGNOSIS — J9690 Respiratory failure, unspecified, unspecified whether with hypoxia or hypercapnia: Secondary | ICD-10-CM | POA: Diagnosis not present

## 2020-12-12 DIAGNOSIS — Z9981 Dependence on supplemental oxygen: Secondary | ICD-10-CM | POA: Diagnosis not present

## 2020-12-12 DIAGNOSIS — J439 Emphysema, unspecified: Secondary | ICD-10-CM | POA: Diagnosis not present

## 2020-12-16 DIAGNOSIS — B1920 Unspecified viral hepatitis C without hepatic coma: Secondary | ICD-10-CM | POA: Diagnosis not present

## 2020-12-16 DIAGNOSIS — G629 Polyneuropathy, unspecified: Secondary | ICD-10-CM | POA: Diagnosis not present

## 2020-12-16 DIAGNOSIS — J439 Emphysema, unspecified: Secondary | ICD-10-CM | POA: Diagnosis not present

## 2020-12-16 DIAGNOSIS — J302 Other seasonal allergic rhinitis: Secondary | ICD-10-CM | POA: Diagnosis not present

## 2020-12-16 DIAGNOSIS — J9690 Respiratory failure, unspecified, unspecified whether with hypoxia or hypercapnia: Secondary | ICD-10-CM | POA: Diagnosis not present

## 2020-12-16 DIAGNOSIS — Z9981 Dependence on supplemental oxygen: Secondary | ICD-10-CM | POA: Diagnosis not present

## 2020-12-18 DIAGNOSIS — R634 Abnormal weight loss: Secondary | ICD-10-CM | POA: Diagnosis not present

## 2020-12-18 DIAGNOSIS — F1011 Alcohol abuse, in remission: Secondary | ICD-10-CM | POA: Diagnosis not present

## 2020-12-18 DIAGNOSIS — D649 Anemia, unspecified: Secondary | ICD-10-CM | POA: Diagnosis not present

## 2020-12-18 DIAGNOSIS — I2510 Atherosclerotic heart disease of native coronary artery without angina pectoris: Secondary | ICD-10-CM | POA: Diagnosis not present

## 2020-12-18 DIAGNOSIS — J439 Emphysema, unspecified: Secondary | ICD-10-CM | POA: Diagnosis not present

## 2020-12-18 DIAGNOSIS — J9691 Respiratory failure, unspecified with hypoxia: Secondary | ICD-10-CM | POA: Diagnosis not present

## 2020-12-18 DIAGNOSIS — R911 Solitary pulmonary nodule: Secondary | ICD-10-CM | POA: Diagnosis not present

## 2020-12-18 DIAGNOSIS — R1310 Dysphagia, unspecified: Secondary | ICD-10-CM | POA: Diagnosis not present

## 2020-12-18 DIAGNOSIS — F32A Depression, unspecified: Secondary | ICD-10-CM | POA: Diagnosis not present

## 2020-12-18 DIAGNOSIS — R627 Adult failure to thrive: Secondary | ICD-10-CM | POA: Diagnosis not present

## 2020-12-18 DIAGNOSIS — R519 Headache, unspecified: Secondary | ICD-10-CM | POA: Diagnosis not present

## 2020-12-18 DIAGNOSIS — B1920 Unspecified viral hepatitis C without hepatic coma: Secondary | ICD-10-CM | POA: Diagnosis not present

## 2020-12-18 DIAGNOSIS — G629 Polyneuropathy, unspecified: Secondary | ICD-10-CM | POA: Diagnosis not present

## 2020-12-18 DIAGNOSIS — Z9221 Personal history of antineoplastic chemotherapy: Secondary | ICD-10-CM | POA: Diagnosis not present

## 2020-12-18 DIAGNOSIS — Z8546 Personal history of malignant neoplasm of prostate: Secondary | ICD-10-CM | POA: Diagnosis not present

## 2020-12-18 DIAGNOSIS — Z923 Personal history of irradiation: Secondary | ICD-10-CM | POA: Diagnosis not present

## 2020-12-18 DIAGNOSIS — Z87891 Personal history of nicotine dependence: Secondary | ICD-10-CM | POA: Diagnosis not present

## 2020-12-18 DIAGNOSIS — I252 Old myocardial infarction: Secondary | ICD-10-CM | POA: Diagnosis not present

## 2020-12-18 DIAGNOSIS — Z9981 Dependence on supplemental oxygen: Secondary | ICD-10-CM | POA: Diagnosis not present

## 2020-12-18 DIAGNOSIS — J302 Other seasonal allergic rhinitis: Secondary | ICD-10-CM | POA: Diagnosis not present

## 2020-12-18 DIAGNOSIS — F419 Anxiety disorder, unspecified: Secondary | ICD-10-CM | POA: Diagnosis not present

## 2020-12-18 DIAGNOSIS — Z85828 Personal history of other malignant neoplasm of skin: Secondary | ICD-10-CM | POA: Diagnosis not present

## 2020-12-19 DIAGNOSIS — G629 Polyneuropathy, unspecified: Secondary | ICD-10-CM | POA: Diagnosis not present

## 2020-12-19 DIAGNOSIS — F419 Anxiety disorder, unspecified: Secondary | ICD-10-CM | POA: Diagnosis not present

## 2020-12-19 DIAGNOSIS — J302 Other seasonal allergic rhinitis: Secondary | ICD-10-CM | POA: Diagnosis not present

## 2020-12-19 DIAGNOSIS — J9691 Respiratory failure, unspecified with hypoxia: Secondary | ICD-10-CM | POA: Diagnosis not present

## 2020-12-19 DIAGNOSIS — J439 Emphysema, unspecified: Secondary | ICD-10-CM | POA: Diagnosis not present

## 2020-12-19 DIAGNOSIS — B1920 Unspecified viral hepatitis C without hepatic coma: Secondary | ICD-10-CM | POA: Diagnosis not present

## 2020-12-26 DIAGNOSIS — J439 Emphysema, unspecified: Secondary | ICD-10-CM | POA: Diagnosis not present

## 2020-12-26 DIAGNOSIS — J9691 Respiratory failure, unspecified with hypoxia: Secondary | ICD-10-CM | POA: Diagnosis not present

## 2020-12-26 DIAGNOSIS — F419 Anxiety disorder, unspecified: Secondary | ICD-10-CM | POA: Diagnosis not present

## 2020-12-26 DIAGNOSIS — B1920 Unspecified viral hepatitis C without hepatic coma: Secondary | ICD-10-CM | POA: Diagnosis not present

## 2020-12-26 DIAGNOSIS — J302 Other seasonal allergic rhinitis: Secondary | ICD-10-CM | POA: Diagnosis not present

## 2020-12-26 DIAGNOSIS — G629 Polyneuropathy, unspecified: Secondary | ICD-10-CM | POA: Diagnosis not present

## 2021-01-01 ENCOUNTER — Telehealth: Payer: Self-pay | Admitting: Nurse Practitioner

## 2021-01-01 DIAGNOSIS — G629 Polyneuropathy, unspecified: Secondary | ICD-10-CM | POA: Diagnosis not present

## 2021-01-01 DIAGNOSIS — J302 Other seasonal allergic rhinitis: Secondary | ICD-10-CM | POA: Diagnosis not present

## 2021-01-01 DIAGNOSIS — F419 Anxiety disorder, unspecified: Secondary | ICD-10-CM | POA: Diagnosis not present

## 2021-01-01 DIAGNOSIS — B1920 Unspecified viral hepatitis C without hepatic coma: Secondary | ICD-10-CM | POA: Diagnosis not present

## 2021-01-01 DIAGNOSIS — J439 Emphysema, unspecified: Secondary | ICD-10-CM | POA: Diagnosis not present

## 2021-01-01 DIAGNOSIS — J9691 Respiratory failure, unspecified with hypoxia: Secondary | ICD-10-CM | POA: Diagnosis not present

## 2021-01-01 MED ORDER — PREDNISONE 10 MG PO TABS: 10.0000 mg | ORAL_TABLET | Freq: Every day | ORAL | 0 refills | Status: DC

## 2021-01-01 NOTE — Telephone Encounter (Signed)
Copied from [place] [id number]. Topic: General - Other >> Jan 01, 2021 11:16 AM Yvette Rack wrote: Reason for CRM: Mitzi with [facility] stated pt cough is re-emerging and they would like to request approval for Rx for prednisone 10 MG. Mitzi stated pt reports that this may be his last holiday with his family and he would like to enjoy it as much as possible. Cb# [phone]

## 2021-01-02 MED ORDER — AMOXICILLIN-POT CLAVULANATE 875-125 MG PO TABS: 1.0000 | ORAL_TABLET | Freq: Two times a day (BID) | ORAL | 0 refills | Status: AC

## 2021-01-02 NOTE — Addendum Note (Signed)
Addended by: Marnee Guarneri T on: 01/02/2021 02:49 PM   Modules accepted: Orders

## 2021-01-02 NOTE — Telephone Encounter (Signed)
Mitzi notified.

## 2021-01-02 NOTE — Telephone Encounter (Signed)
Called Mitzi and let her know. She states that since yesterday, the patient has started having a productive cough with green phlegm. Requesting for an antibiotic to be sent in if possible.

## 2021-01-16 DIAGNOSIS — F419 Anxiety disorder, unspecified: Secondary | ICD-10-CM | POA: Diagnosis not present

## 2021-01-16 DIAGNOSIS — J302 Other seasonal allergic rhinitis: Secondary | ICD-10-CM | POA: Diagnosis not present

## 2021-01-16 DIAGNOSIS — G629 Polyneuropathy, unspecified: Secondary | ICD-10-CM | POA: Diagnosis not present

## 2021-01-16 DIAGNOSIS — B1920 Unspecified viral hepatitis C without hepatic coma: Secondary | ICD-10-CM | POA: Diagnosis not present

## 2021-01-16 DIAGNOSIS — J9691 Respiratory failure, unspecified with hypoxia: Secondary | ICD-10-CM | POA: Diagnosis not present

## 2021-01-16 DIAGNOSIS — J439 Emphysema, unspecified: Secondary | ICD-10-CM | POA: Diagnosis not present

## 2021-01-18 DIAGNOSIS — Z85828 Personal history of other malignant neoplasm of skin: Secondary | ICD-10-CM | POA: Diagnosis not present

## 2021-01-18 DIAGNOSIS — Z9221 Personal history of antineoplastic chemotherapy: Secondary | ICD-10-CM | POA: Diagnosis not present

## 2021-01-18 DIAGNOSIS — J9691 Respiratory failure, unspecified with hypoxia: Secondary | ICD-10-CM | POA: Diagnosis not present

## 2021-01-18 DIAGNOSIS — B1920 Unspecified viral hepatitis C without hepatic coma: Secondary | ICD-10-CM | POA: Diagnosis not present

## 2021-01-18 DIAGNOSIS — Z9981 Dependence on supplemental oxygen: Secondary | ICD-10-CM | POA: Diagnosis not present

## 2021-01-18 DIAGNOSIS — I2510 Atherosclerotic heart disease of native coronary artery without angina pectoris: Secondary | ICD-10-CM | POA: Diagnosis not present

## 2021-01-18 DIAGNOSIS — J302 Other seasonal allergic rhinitis: Secondary | ICD-10-CM | POA: Diagnosis not present

## 2021-01-18 DIAGNOSIS — I252 Old myocardial infarction: Secondary | ICD-10-CM | POA: Diagnosis not present

## 2021-01-18 DIAGNOSIS — R519 Headache, unspecified: Secondary | ICD-10-CM | POA: Diagnosis not present

## 2021-01-18 DIAGNOSIS — Z8546 Personal history of malignant neoplasm of prostate: Secondary | ICD-10-CM | POA: Diagnosis not present

## 2021-01-18 DIAGNOSIS — J439 Emphysema, unspecified: Secondary | ICD-10-CM | POA: Diagnosis not present

## 2021-01-18 DIAGNOSIS — F1011 Alcohol abuse, in remission: Secondary | ICD-10-CM | POA: Diagnosis not present

## 2021-01-18 DIAGNOSIS — F32A Depression, unspecified: Secondary | ICD-10-CM | POA: Diagnosis not present

## 2021-01-18 DIAGNOSIS — R634 Abnormal weight loss: Secondary | ICD-10-CM | POA: Diagnosis not present

## 2021-01-18 DIAGNOSIS — Z923 Personal history of irradiation: Secondary | ICD-10-CM | POA: Diagnosis not present

## 2021-01-18 DIAGNOSIS — Z87891 Personal history of nicotine dependence: Secondary | ICD-10-CM | POA: Diagnosis not present

## 2021-01-18 DIAGNOSIS — F419 Anxiety disorder, unspecified: Secondary | ICD-10-CM | POA: Diagnosis not present

## 2021-01-18 DIAGNOSIS — R1310 Dysphagia, unspecified: Secondary | ICD-10-CM | POA: Diagnosis not present

## 2021-01-18 DIAGNOSIS — R627 Adult failure to thrive: Secondary | ICD-10-CM | POA: Diagnosis not present

## 2021-01-18 DIAGNOSIS — R911 Solitary pulmonary nodule: Secondary | ICD-10-CM | POA: Diagnosis not present

## 2021-01-18 DIAGNOSIS — G629 Polyneuropathy, unspecified: Secondary | ICD-10-CM | POA: Diagnosis not present

## 2021-01-18 DIAGNOSIS — D649 Anemia, unspecified: Secondary | ICD-10-CM | POA: Diagnosis not present

## 2021-01-20 DIAGNOSIS — B1920 Unspecified viral hepatitis C without hepatic coma: Secondary | ICD-10-CM | POA: Diagnosis not present

## 2021-01-20 DIAGNOSIS — J439 Emphysema, unspecified: Secondary | ICD-10-CM | POA: Diagnosis not present

## 2021-01-20 DIAGNOSIS — J9691 Respiratory failure, unspecified with hypoxia: Secondary | ICD-10-CM | POA: Diagnosis not present

## 2021-01-20 DIAGNOSIS — F419 Anxiety disorder, unspecified: Secondary | ICD-10-CM | POA: Diagnosis not present

## 2021-01-20 DIAGNOSIS — J302 Other seasonal allergic rhinitis: Secondary | ICD-10-CM | POA: Diagnosis not present

## 2021-01-20 DIAGNOSIS — G629 Polyneuropathy, unspecified: Secondary | ICD-10-CM | POA: Diagnosis not present

## 2021-01-21 DIAGNOSIS — F419 Anxiety disorder, unspecified: Secondary | ICD-10-CM | POA: Diagnosis not present

## 2021-01-21 DIAGNOSIS — J439 Emphysema, unspecified: Secondary | ICD-10-CM | POA: Diagnosis not present

## 2021-01-21 DIAGNOSIS — J9691 Respiratory failure, unspecified with hypoxia: Secondary | ICD-10-CM | POA: Diagnosis not present

## 2021-01-21 DIAGNOSIS — J302 Other seasonal allergic rhinitis: Secondary | ICD-10-CM | POA: Diagnosis not present

## 2021-01-21 DIAGNOSIS — G629 Polyneuropathy, unspecified: Secondary | ICD-10-CM | POA: Diagnosis not present

## 2021-01-21 DIAGNOSIS — B1920 Unspecified viral hepatitis C without hepatic coma: Secondary | ICD-10-CM | POA: Diagnosis not present

## 2021-01-23 ENCOUNTER — Other Ambulatory Visit: Payer: Self-pay | Admitting: Nurse Practitioner

## 2021-01-23 ENCOUNTER — Telehealth: Payer: Self-pay | Admitting: Nurse Practitioner

## 2021-01-23 MED ORDER — AMOXICILLIN-POT CLAVULANATE 400-57 MG/5ML PO SUSR: 400.0000 mg | Freq: Two times a day (BID) | ORAL | 0 refills | Status: AC

## 2021-01-23 MED ORDER — TAMSULOSIN HCL 0.4 MG PO CAPS: 0.4000 mg | ORAL_CAPSULE | Freq: Every day | ORAL | 4 refills | Status: AC

## 2021-01-23 MED ORDER — PREDNISONE 10 MG PO TABS: 10.0000 mg | ORAL_TABLET | Freq: Every day | ORAL | 0 refills | Status: DC

## 2021-01-23 NOTE — Telephone Encounter (Signed)
Patient made aware of results and verbalized understanding.  

## 2021-01-23 NOTE — Telephone Encounter (Signed)
Kendra with [facility] states pt has been on abx several times in the past few weeks.  He gets better, but then after the abx stops, the cough returns. Nurse is asking if the dr will consider restarting abx.  If abx is sent, please make sure it is liquid, as pt cannot swallow tablets.  Pt has a hx of prostrate cancer.  Would like to know if you will consider a med like Flomax or similar to help with urination issues  Cb [phone]

## 2021-01-23 NOTE — Telephone Encounter (Signed)
Routing to provider to advise.  

## 2021-01-28 DIAGNOSIS — J439 Emphysema, unspecified: Secondary | ICD-10-CM | POA: Diagnosis not present

## 2021-01-28 DIAGNOSIS — G629 Polyneuropathy, unspecified: Secondary | ICD-10-CM | POA: Diagnosis not present

## 2021-01-28 DIAGNOSIS — B1920 Unspecified viral hepatitis C without hepatic coma: Secondary | ICD-10-CM | POA: Diagnosis not present

## 2021-01-28 DIAGNOSIS — J9691 Respiratory failure, unspecified with hypoxia: Secondary | ICD-10-CM | POA: Diagnosis not present

## 2021-01-28 DIAGNOSIS — J302 Other seasonal allergic rhinitis: Secondary | ICD-10-CM | POA: Diagnosis not present

## 2021-01-28 DIAGNOSIS — F419 Anxiety disorder, unspecified: Secondary | ICD-10-CM | POA: Diagnosis not present

## 2021-02-04 ENCOUNTER — Ambulatory Visit: Payer: Medicare Other

## 2021-02-04 NOTE — Progress Notes (Unsigned)
Subjective:   Kevin Nelson is a 71 y.o. male who presents for Medicare Annual/Subsequent preventive examination.  Review of Systems    ***       Objective:    There were no vitals filed for this visit. There is no height or weight on file to calculate BMI.  Advanced Directives 08/30/2020 02/01/2020 09/20/2019 07/11/2019 06/21/2019 05/31/2019 05/21/2019  Does Patient Have a Medical Advance Directive? No Yes No No Yes No;Yes Yes  Type of Advance Directive - [place];Living will - Healthcare Power of [place]  Does patient want to make changes to medical advance directive? - - - No - Patient declined - - -  Copy of [place] in Chart? - No - copy requested - No - copy requested - - No - copy requested  Would patient like information on creating a medical advance directive? No - Patient declined - - No - Patient declined - No - Patient declined -    Current Medications (verified) Outpatient Encounter Medications as of 02/04/2021  Medication Sig   albuterol (PROVENTIL) (2.5 MG/3ML) 0.083% nebulizer solution Take 3 mLs (2.5 mg total) by nebulization every 6 (six) hours as needed for wheezing or shortness of breath.   azithromycin (ZITHROMAX) 250 MG tablet Take as directed   clonazePAM (KLONOPIN) 1 MG tablet TAKE 1 TABLET(1 MG) BY MOUTH TWICE DAILY AS NEEDED FOR ANXIETY   docusate sodium (COLACE) 100 MG capsule Take 100-200 mg by mouth daily as needed for mild constipation or moderate constipation.   fluticasone (FLONASE) 50 MCG/ACT nasal spray SHAKE LIQUID AND USE 1 SPRAY IN EACH NOSTRIL TWICE DAILY   gabapentin (NEURONTIN) 100 MG capsule Take 1 capsule (100 mg total) by mouth every morning.   gabapentin (NEURONTIN) 600 MG tablet Take 0.5 tablets (300 mg total) by mouth at bedtime.   guaiFENesin-dextromethorphan (ROBITUSSIN DM) 100-10 MG/5ML syrup Take 10 mLs by mouth every 4 (four) hours as needed for  cough (chest congestion).   ipratropium-albuterol (DUONEB) 0.5-2.5 (3) MG/3ML SOLN USE 3 ML VIA NEBULIZER EVERY 6 HOURS AS NEEDED   LORazepam (ATIVAN) 1 MG tablet Take 1 tablet (1 mg total) by mouth daily as needed (this is for alcohol withdrawal use only). DO NOT TAKE THIS FOR MOOD -- USE KLONOPIN FOR ANXIETY.  THIS IS FOR ALCOHOL WITHDRAWAL SYMPTOMS ONLY.   lubiprostone (AMITIZA) 8 MCG capsule Take 1 capsule (8 mcg total) by mouth daily with breakfast.   morphine (ROXANOL) 20 MG/ML concentrated solution Take 0.25 mL by mouth every 2 hours as needed for moderate to severe pain (5-10 on pain scale).  Hospice patient.   Multiple Vitamin (MULTIVITAMIN WITH MINERALS) TABS tablet Take 1 tablet by mouth daily.   nicotine (NICODERM CQ - DOSED IN MG/24 HOURS) 21 mg/24hr patch Place 1 patch (21 mg total) onto the skin daily.   oxyCODONE-acetaminophen (PERCOCET) 10-325 MG tablet Take 1 tablet by mouth every 12 (twelve) hours as needed for pain.   predniSONE (DELTASONE) 10 MG tablet Take 1 tablet (10 mg total) by mouth daily with breakfast.   sodium chloride (OCEAN) 0.65 % SOLN nasal spray Place 1 spray into both nostrils as needed for congestion.   tamsulosin (FLOMAX) 0.4 MG CAPS capsule Take 1 capsule (0.4 mg total) by mouth daily.   VENTOLIN HFA 108 (90 Base) MCG/ACT inhaler INHALE 2 PUFFS INTO THE LUNGS EVERY 6 HOURS AS NEEDED FOR WHEEZING OR SHORTNESS OF BREATH  No facility-administered encounter medications on file as of 02/04/2021.    Allergies (verified) Patient has no known allergies.   History: Past Medical History:  Diagnosis Date   Anxiety    Basal cell carcinoma    Cigarette smoker    Dental decay    Dysphagia    Dyspnea    End stage COPD ([place])    end stage copd, emphysema   Erectile dysfunction    Headache    every morning   Hepatitis C    treated for 8 weeks this past year with [facility]   History of nonmelanoma skin cancer    Hoarseness, chronic    Myocardial infarction ([place])  march/april 2020   mild heart attack   Prostate cancer ([place])    Respiratory failure, acute ([place])    was inpt for 5 days. thought he was going to die.   Sleep apnea    Squamous cell skin cancer    Throat cancer ([facility])    Use of leuprolide acetate (Lupron)    treated for prostate cancer.    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   Past Surgical History:  Procedure Laterality Date   FACIAL LACERATION REPAIR     HERNIA REPAIR Left 1992   inguinal   IR GASTROSTOMY TUBE MOD SED  04/26/2019   MICROLARYNGOSCOPY N/A 01/17/2019   Procedure: MICROLARYNGOSCOPY WITH BX;  Surgeon: Clyde Canterbury, MD;  Location: [facility] OR[facility];  Service: ENT;  Laterality: N/A;   PORTA CATH INSERTION N/A 03/15/2019   Procedure: PORTA CATH INSERTION;  Surgeon: Algernon Huxley, MD;  Location: [facility];  Service: Cardiovascular;  Laterality: N/A;   PORTA CATH REMOVAL N/A 10/08/2019   Procedure: PORTA CATH REMOVAL;  Surgeon: Algernon Huxley, MD;  Location: [facility];  Service: Cardiovascular;  Laterality: N/A;   PROSTATE BIOPSY     SKIN CANCER EXCISION     TRIGGER FINGER RELEASE Left 10/2018   Family History  Problem Relation Age of Onset   Cancer Sister    Cancer - Cervical Mother    Heart attack Brother    Heart attack Maternal Grandfather    Social History   Socioeconomic History   Marital status: Significant Other    Spouse name: [name], girlfriend   Number of children: Not on file   Years of education: Not on file   Highest education level: Not on file  Occupational History   Occupation: Chief Strategy Officer    Comment: retired  Tobacco Use   Smoking status: Every Day    Packs/day: 0.50    Years: 52.00    Pack years: 26.00    Types: Cigarettes   Smokeless tobacco: Never   Tobacco comments:    6-7 a day  05/01/2020  Vaping Use   Vaping Use: Never used  Substance and Sexual Activity   Alcohol use: Not Currently    Alcohol/week: 3.0 standard drinks    Types: 3 Cans of beer per week    Drug use: Never   Sexual activity: Yes  Other Topics Concern   Not on file  Social History Narrative   Patient has retired d/t respiratory status. Currently lives with girlfriend    Social Determinants of Health   Financial Resource Strain: Not on file  Food Insecurity: Not on file  Transportation Needs: Not on file  Physical Activity: Not on file  Stress: Not on file  Social Connections: Not on file    Tobacco Counseling Ready  to quit: Not Answered Counseling given: Not Answered Tobacco comments: 6-7 a day  05/01/2020   Clinical Intake:                 Diabetic?***         Activities of Daily Living In your present state of health, do you have any difficulty performing the following activities: 08/30/2020  Hearing? Y  Comment Hearing aid at home  Vision? N  Difficulty concentrating or making decisions? N  Walking or climbing stairs? N  Dressing or bathing? N  Doing errands, shopping? N  Some recent data might be hidden    Patient Care Team: [name], NP as PCP - General (Nurse Practitioner) Vanita Ingles, RN as Case Manager (General Practice)  Indicate any recent Medical Services you may have received from other than [facility] providers in the past year (date may be approximate).     Assessment:   This is a routine wellness examination for Kevin Nelson.  Hearing/Vision screen No results found.  Dietary issues and exercise activities discussed:     Goals Addressed   None    Depression Screen PHQ 2/9 Scores 04/28/2020 02/01/2020 01/28/2020 09/18/2019 06/05/2019 03/05/2019 01/10/2019  PHQ - 2 Score 0 0 0 0 2 6 1   PHQ- 9 Score 0 - 2 3 12 14  -    Fall Risk Fall Risk  02/01/2020 01/28/2020 01/10/2019 07/31/2018  Falls in the past year? 0 0 0 0  Number falls in past yr: - - 0 0  Injury with Fall? - - 0 0  Risk for fall due to : Medication side effect History of fall(s) - -  Follow up Falls evaluation completed;Education provided;Falls prevention  discussed - - Falls evaluation completed    FALL RISK PREVENTION PERTAINING TO THE HOME:  Any stairs in or around the home? {YES/NO:21197} If so, are there any without handrails? {YES/NO:21197} Home free of loose throw rugs in walkways, pet beds, electrical cords, etc? {YES/NO:21197} Adequate lighting in your home to reduce risk of falls? {YES/NO:21197}  ASSISTIVE DEVICES UTILIZED TO PREVENT FALLS:  Life alert? {YES/NO:21197} Use of a cane, walker or w/c? {YES/NO:21197} Grab bars in the bathroom? {YES/NO:21197} Shower chair or bench in shower? {YES/NO:21197} Elevated toilet seat or a handicapped toilet? {YES/NO:21197}  TIMED UP AND GO:  Was the test performed? {YES/NO:21197}.  Length of time to ambulate 10 feet: *** sec.   {Appearance of [id number]  Cognitive Function:     6CIT Screen 02/01/2020  What Year? 0 points  What month? 0 points  What time? 0 points  Count back from 20 0 points  Months in reverse 0 points  Repeat phrase 2 points  Total Score 2    Immunizations Immunization History  Administered Date(s) Administered   IPV 08/26/1952, 09/30/1952   Influenza-Unspecified 10/18/2017   Moderna Sars-Covid-2 Vaccination 08/13/2019, 09/13/2019    {TDAP status:2101805}  {Flu Vaccine status:2101806}  {Pneumococcal vaccine status:2101807}  {Covid-19 vaccine status:2101808}  Qualifies for Shingles Vaccine? {YES/NO:21197}  Zostavax completed {YES/NO:21197}  {Shingrix Completed?:2101804}  Screening Tests Health Maintenance  Topic Date Due   Pneumonia Vaccine 2+ Years old (1 - PCV) Never done   Zoster Vaccines- Shingrix (1 of 2) Never done   COLONOSCOPY (Pts 45-79yrs Insurance coverage will need to be confirmed)  Never done   COVID-19 Vaccine (3 - Moderna risk series) 10/11/2019   INFLUENZA VACCINE  08/18/2020   TETANUS/TDAP  04/28/2021 (Originally 10/18/1969)   Hepatitis C Screening  Completed   HPV VACCINES  Aged Out    Health Maintenance  Health  Maintenance Due  Topic Date Due   Pneumonia Vaccine 30+ Years old (1 - PCV) Never done   Zoster Vaccines- Shingrix (1 of 2) Never done   COLONOSCOPY (Pts 45-20yrs Insurance coverage will need to be confirmed)  Never done   COVID-19 Vaccine (3 - Moderna risk series) 10/11/2019   INFLUENZA VACCINE  08/18/2020    {Colorectal cancer screening:2101809}  Lung Cancer Screening: (Low Dose CT Chest recommended if Age 71-80 years, 30 pack-year currently smoking OR have quit w/in 15years.) {DOES NOT does:27190::"does not"} qualify.   Lung Cancer Screening Referral: ***  Additional Screening:  Hepatitis C Screening: {DOES NOT does:27190::"does not"} qualify; Completed ***  Vision Screening: Recommended annual ophthalmology exams for early detection of glaucoma and other disorders of the eye. Is the patient up to date with their annual eye exam?  {YES/NO:21197} Who is the provider or what is the name of the office in which the patient attends annual eye exams? *** If pt is not established with a provider, would they like to be referred to a provider to establish care? {YES/NO:21197}.   Dental Screening: Recommended annual dental exams for proper oral hygiene  Community Resource Referral / Chronic Care Management: CRR required this visit?  {YES/NO:21197}  CCM required this visit?  {YES/NO:21197}     Plan:     I have personally reviewed and noted the following in the patients chart:   Medical and social history Use of alcohol, tobacco or illicit drugs  Current medications and supplements including opioid prescriptions. {Opioid Prescriptions:[phone]} Functional ability and status Nutritional status Physical activity Advanced directives List of other physicians Hospitalizations, surgeries, and ER visits in previous 12 months Vitals Screenings to include cognitive, depression, and falls Referrals and appointments  In addition, I have reviewed and discussed with patient certain  preventive protocols, quality metrics, and best practice recommendations. A written personalized care plan for preventive services as well as general preventive health recommendations were provided to patient.     Leroy Kennedy, LPN   5/32/9924   Nurse Notes: ***

## 2021-02-06 ENCOUNTER — Ambulatory Visit: Payer: Medicare Other

## 2021-02-09 DIAGNOSIS — J9691 Respiratory failure, unspecified with hypoxia: Secondary | ICD-10-CM | POA: Diagnosis not present

## 2021-02-09 DIAGNOSIS — F419 Anxiety disorder, unspecified: Secondary | ICD-10-CM | POA: Diagnosis not present

## 2021-02-09 DIAGNOSIS — J439 Emphysema, unspecified: Secondary | ICD-10-CM | POA: Diagnosis not present

## 2021-02-09 DIAGNOSIS — B1920 Unspecified viral hepatitis C without hepatic coma: Secondary | ICD-10-CM | POA: Diagnosis not present

## 2021-02-09 DIAGNOSIS — G629 Polyneuropathy, unspecified: Secondary | ICD-10-CM | POA: Diagnosis not present

## 2021-02-09 DIAGNOSIS — J302 Other seasonal allergic rhinitis: Secondary | ICD-10-CM | POA: Diagnosis not present

## 2021-02-11 ENCOUNTER — Other Ambulatory Visit: Payer: Self-pay | Admitting: Nurse Practitioner

## 2021-02-11 ENCOUNTER — Telehealth: Payer: Self-pay | Admitting: Nurse Practitioner

## 2021-02-11 DIAGNOSIS — J302 Other seasonal allergic rhinitis: Secondary | ICD-10-CM | POA: Diagnosis not present

## 2021-02-11 DIAGNOSIS — G629 Polyneuropathy, unspecified: Secondary | ICD-10-CM | POA: Diagnosis not present

## 2021-02-11 DIAGNOSIS — J9691 Respiratory failure, unspecified with hypoxia: Secondary | ICD-10-CM | POA: Diagnosis not present

## 2021-02-11 DIAGNOSIS — B1920 Unspecified viral hepatitis C without hepatic coma: Secondary | ICD-10-CM | POA: Diagnosis not present

## 2021-02-11 DIAGNOSIS — F419 Anxiety disorder, unspecified: Secondary | ICD-10-CM | POA: Diagnosis not present

## 2021-02-11 DIAGNOSIS — J439 Emphysema, unspecified: Secondary | ICD-10-CM | POA: Diagnosis not present

## 2021-02-11 MED ORDER — AMOXICILLIN-POT CLAVULANATE 875-125 MG PO TABS: 1.0000 | ORAL_TABLET | Freq: Two times a day (BID) | ORAL | 0 refills | Status: DC

## 2021-02-11 NOTE — Telephone Encounter (Signed)
Copied from [place] [phone]. Topic: Quick Communication - Rx Refill/Question >> Feb 11, 2021 12:41 PM [name] A wrote: Medication: oxyCODONE-acetaminophen (PERCOCET) 10-325 MG tablet [[id number]]   Has the patient contacted their pharmacy? No. (Agent: If no, request that the patient contact the pharmacy for the refill. If patient does not wish to contact the pharmacy document the reason why and proceed with request.) (Agent: If yes, when and what did the pharmacy advise?)  Preferred Pharmacy (with phone number or street name): [id number] [address] [place] [address] Phone: [phone] Fax: [phone]   Has the patient been seen for an appointment in the last year OR does the patient have an upcoming appointment? Yes.    Agent: Please be advised that RX refills may take up to 3 business days. We ask that you follow-up with your pharmacy.

## 2021-02-11 NOTE — Telephone Encounter (Signed)
Mitzi calling from [facility] contact: [phone] Pt's girlfriend is requesting another antibiotic round because of the patients temperature of 99.2 - no other symptoms. Mitzi just called to relay message.

## 2021-02-11 NOTE — Telephone Encounter (Signed)
Requested medication (s) are due for refill today: yes  Requested medication (s) are on the active medication list: yes  Last refill:  10/04/21  Future visit scheduled: no  Notes to clinic:  Unable to refill per protocol, cannot delegate.      Requested Prescriptions  Pending Prescriptions Disp Refills   oxyCODONE-acetaminophen (PERCOCET) 10-325 MG tablet 30 tablet 0    Sig: Take 1 tablet by mouth every 12 (twelve) hours as needed for pain.     Not Delegated - Analgesics:  Opioid Agonist Combinations Failed - 02/11/2021  3:05 PM      Failed - This refill cannot be delegated      Failed - Urine Drug Screen completed in last 360 days      Failed - Valid encounter within last 6 months    Recent Outpatient Visits           6 months ago Chronic hepatitis ([place])   [place] Cannady, [name] T, NP   9 months ago COPD, very severe ([place])   [place], Henrine Screws T, NP   1 year ago COPD, very severe ([place])   [place], [name] T, NP   1 year ago COPD, very severe ([place])   [place], [name] T, NP   1 year ago COPD, very severe ([place])   [place], [name], NP

## 2021-02-12 ENCOUNTER — Telehealth: Payer: Self-pay | Admitting: Nurse Practitioner

## 2021-02-12 MED ORDER — AMOXICILLIN-POT CLAVULANATE 400-57 MG/5ML PO SUSR: 400.0000 mg | Freq: Two times a day (BID) | ORAL | 0 refills | Status: DC

## 2021-02-12 MED ORDER — OXYCODONE-ACETAMINOPHEN 10-325 MG PO TABS: 1.0000 | ORAL_TABLET | Freq: Two times a day (BID) | ORAL | 0 refills | Status: AC | PRN

## 2021-02-12 NOTE — Telephone Encounter (Signed)
Spoke with Crystal from Hospice. She says the person who normally handles the scripts is out of the office today and she is wondering if you wouldn't mind sending a over a courtesy refill for the Oxycodone 10 MG tablet. She says that the part of the chart that she sees where [place] has been the prescribing the medication. Please advise?

## 2021-02-12 NOTE — Addendum Note (Signed)
Addended by: Marnee Guarneri T on: 02/12/2021 06:40 PM   Modules accepted: Orders

## 2021-02-12 NOTE — Addendum Note (Signed)
Addended by: Marnee Guarneri T on: 02/12/2021 11:53 AM   Modules accepted: Orders

## 2021-02-12 NOTE — Telephone Encounter (Signed)
Crystal called from Hospice reporting that the patient needs a liquid antibiotic, the pill form is too big for him to swallow.  Best contact: [phone] Also requesting oxycodone oxyCODONE-acetaminophen (PERCOCET) 10-325 MG tablet

## 2021-02-13 ENCOUNTER — Telehealth: Payer: Self-pay | Admitting: Nurse Practitioner

## 2021-02-13 NOTE — Telephone Encounter (Signed)
Pt is calling to ask [name] why was his oxyCODONE-acetaminophen (PERCOCET) 10-325 MG tablet [[id number]]  changed? Pt states that he has been on oxycone for years. The oxycodone-acetaminophen is a new script.   Please advise [phone]  [id number] [address], [place] - [place], [place] [address]

## 2021-02-13 NOTE — Telephone Encounter (Signed)
Called to notify patient of [name]'s recommendations regarding prescription and unable to leave message for the patient as his voicemail is not set up.

## 2021-02-16 ENCOUNTER — Other Ambulatory Visit: Payer: Self-pay | Admitting: Pulmonary Disease

## 2021-02-17 DIAGNOSIS — J9691 Respiratory failure, unspecified with hypoxia: Secondary | ICD-10-CM | POA: Diagnosis not present

## 2021-02-17 DIAGNOSIS — J302 Other seasonal allergic rhinitis: Secondary | ICD-10-CM | POA: Diagnosis not present

## 2021-02-17 DIAGNOSIS — F419 Anxiety disorder, unspecified: Secondary | ICD-10-CM | POA: Diagnosis not present

## 2021-02-17 DIAGNOSIS — B1920 Unspecified viral hepatitis C without hepatic coma: Secondary | ICD-10-CM | POA: Diagnosis not present

## 2021-02-17 DIAGNOSIS — J439 Emphysema, unspecified: Secondary | ICD-10-CM | POA: Diagnosis not present

## 2021-02-17 DIAGNOSIS — G629 Polyneuropathy, unspecified: Secondary | ICD-10-CM | POA: Diagnosis not present

## 2021-02-18 DIAGNOSIS — Z9221 Personal history of antineoplastic chemotherapy: Secondary | ICD-10-CM | POA: Diagnosis not present

## 2021-02-18 DIAGNOSIS — R911 Solitary pulmonary nodule: Secondary | ICD-10-CM | POA: Diagnosis not present

## 2021-02-18 DIAGNOSIS — R519 Headache, unspecified: Secondary | ICD-10-CM | POA: Diagnosis not present

## 2021-02-18 DIAGNOSIS — J9691 Respiratory failure, unspecified with hypoxia: Secondary | ICD-10-CM | POA: Diagnosis not present

## 2021-02-18 DIAGNOSIS — G629 Polyneuropathy, unspecified: Secondary | ICD-10-CM | POA: Diagnosis not present

## 2021-02-18 DIAGNOSIS — R627 Adult failure to thrive: Secondary | ICD-10-CM | POA: Diagnosis not present

## 2021-02-18 DIAGNOSIS — B1920 Unspecified viral hepatitis C without hepatic coma: Secondary | ICD-10-CM | POA: Diagnosis not present

## 2021-02-18 DIAGNOSIS — F1011 Alcohol abuse, in remission: Secondary | ICD-10-CM | POA: Diagnosis not present

## 2021-02-18 DIAGNOSIS — Z923 Personal history of irradiation: Secondary | ICD-10-CM | POA: Diagnosis not present

## 2021-02-18 DIAGNOSIS — J302 Other seasonal allergic rhinitis: Secondary | ICD-10-CM | POA: Diagnosis not present

## 2021-02-18 DIAGNOSIS — Z85828 Personal history of other malignant neoplasm of skin: Secondary | ICD-10-CM | POA: Diagnosis not present

## 2021-02-18 DIAGNOSIS — I2510 Atherosclerotic heart disease of native coronary artery without angina pectoris: Secondary | ICD-10-CM | POA: Diagnosis not present

## 2021-02-18 DIAGNOSIS — Z8546 Personal history of malignant neoplasm of prostate: Secondary | ICD-10-CM | POA: Diagnosis not present

## 2021-02-18 DIAGNOSIS — R1310 Dysphagia, unspecified: Secondary | ICD-10-CM | POA: Diagnosis not present

## 2021-02-18 DIAGNOSIS — F419 Anxiety disorder, unspecified: Secondary | ICD-10-CM | POA: Diagnosis not present

## 2021-02-18 DIAGNOSIS — F32A Depression, unspecified: Secondary | ICD-10-CM | POA: Diagnosis not present

## 2021-02-18 DIAGNOSIS — Z87891 Personal history of nicotine dependence: Secondary | ICD-10-CM | POA: Diagnosis not present

## 2021-02-18 DIAGNOSIS — Z9981 Dependence on supplemental oxygen: Secondary | ICD-10-CM | POA: Diagnosis not present

## 2021-02-18 DIAGNOSIS — I252 Old myocardial infarction: Secondary | ICD-10-CM | POA: Diagnosis not present

## 2021-02-18 DIAGNOSIS — R634 Abnormal weight loss: Secondary | ICD-10-CM | POA: Diagnosis not present

## 2021-02-18 DIAGNOSIS — J439 Emphysema, unspecified: Secondary | ICD-10-CM | POA: Diagnosis not present

## 2021-02-18 DIAGNOSIS — D649 Anemia, unspecified: Secondary | ICD-10-CM | POA: Diagnosis not present

## 2021-02-18 NOTE — Telephone Encounter (Signed)
Attempted to call patient to discuss, no answer, left HIPAA compliant message.

## 2021-02-18 NOTE — Telephone Encounter (Signed)
Returned patient phone call, patient states hospice is handling his medication. Patient is very mad stating provider has not bothered to call him and check on him. Patient says provider does not care and he doesn't care that she doesn't care and hung up.

## 2021-02-23 DIAGNOSIS — Z20822 Contact with and (suspected) exposure to covid-19: Secondary | ICD-10-CM | POA: Diagnosis not present

## 2021-02-27 DIAGNOSIS — J439 Emphysema, unspecified: Secondary | ICD-10-CM | POA: Diagnosis not present

## 2021-02-27 DIAGNOSIS — J302 Other seasonal allergic rhinitis: Secondary | ICD-10-CM | POA: Diagnosis not present

## 2021-02-27 DIAGNOSIS — G629 Polyneuropathy, unspecified: Secondary | ICD-10-CM | POA: Diagnosis not present

## 2021-02-27 DIAGNOSIS — F419 Anxiety disorder, unspecified: Secondary | ICD-10-CM | POA: Diagnosis not present

## 2021-02-27 DIAGNOSIS — B1920 Unspecified viral hepatitis C without hepatic coma: Secondary | ICD-10-CM | POA: Diagnosis not present

## 2021-02-27 DIAGNOSIS — J9691 Respiratory failure, unspecified with hypoxia: Secondary | ICD-10-CM | POA: Diagnosis not present

## 2021-03-03 DIAGNOSIS — J439 Emphysema, unspecified: Secondary | ICD-10-CM | POA: Diagnosis not present

## 2021-03-04 DIAGNOSIS — Z20822 Contact with and (suspected) exposure to covid-19: Secondary | ICD-10-CM | POA: Diagnosis not present

## 2021-03-05 DIAGNOSIS — G629 Polyneuropathy, unspecified: Secondary | ICD-10-CM | POA: Diagnosis not present

## 2021-03-05 DIAGNOSIS — J302 Other seasonal allergic rhinitis: Secondary | ICD-10-CM | POA: Diagnosis not present

## 2021-03-05 DIAGNOSIS — J439 Emphysema, unspecified: Secondary | ICD-10-CM | POA: Diagnosis not present

## 2021-03-05 DIAGNOSIS — F419 Anxiety disorder, unspecified: Secondary | ICD-10-CM | POA: Diagnosis not present

## 2021-03-05 DIAGNOSIS — B1920 Unspecified viral hepatitis C without hepatic coma: Secondary | ICD-10-CM | POA: Diagnosis not present

## 2021-03-05 DIAGNOSIS — J9691 Respiratory failure, unspecified with hypoxia: Secondary | ICD-10-CM | POA: Diagnosis not present

## 2021-03-06 DIAGNOSIS — B1920 Unspecified viral hepatitis C without hepatic coma: Secondary | ICD-10-CM | POA: Diagnosis not present

## 2021-03-06 DIAGNOSIS — J439 Emphysema, unspecified: Secondary | ICD-10-CM | POA: Diagnosis not present

## 2021-03-06 DIAGNOSIS — G629 Polyneuropathy, unspecified: Secondary | ICD-10-CM | POA: Diagnosis not present

## 2021-03-06 DIAGNOSIS — F419 Anxiety disorder, unspecified: Secondary | ICD-10-CM | POA: Diagnosis not present

## 2021-03-06 DIAGNOSIS — J302 Other seasonal allergic rhinitis: Secondary | ICD-10-CM | POA: Diagnosis not present

## 2021-03-06 DIAGNOSIS — J9691 Respiratory failure, unspecified with hypoxia: Secondary | ICD-10-CM | POA: Diagnosis not present

## 2021-03-09 DIAGNOSIS — Z20822 Contact with and (suspected) exposure to covid-19: Secondary | ICD-10-CM | POA: Diagnosis not present

## 2021-03-10 DIAGNOSIS — B1920 Unspecified viral hepatitis C without hepatic coma: Secondary | ICD-10-CM | POA: Diagnosis not present

## 2021-03-10 DIAGNOSIS — J439 Emphysema, unspecified: Secondary | ICD-10-CM | POA: Diagnosis not present

## 2021-03-10 DIAGNOSIS — G629 Polyneuropathy, unspecified: Secondary | ICD-10-CM | POA: Diagnosis not present

## 2021-03-10 DIAGNOSIS — J302 Other seasonal allergic rhinitis: Secondary | ICD-10-CM | POA: Diagnosis not present

## 2021-03-10 DIAGNOSIS — J9691 Respiratory failure, unspecified with hypoxia: Secondary | ICD-10-CM | POA: Diagnosis not present

## 2021-03-10 DIAGNOSIS — F419 Anxiety disorder, unspecified: Secondary | ICD-10-CM | POA: Diagnosis not present

## 2021-03-11 DIAGNOSIS — J439 Emphysema, unspecified: Secondary | ICD-10-CM | POA: Diagnosis not present

## 2021-03-18 DIAGNOSIS — J302 Other seasonal allergic rhinitis: Secondary | ICD-10-CM | POA: Diagnosis not present

## 2021-03-18 DIAGNOSIS — Z9221 Personal history of antineoplastic chemotherapy: Secondary | ICD-10-CM | POA: Diagnosis not present

## 2021-03-18 DIAGNOSIS — R1310 Dysphagia, unspecified: Secondary | ICD-10-CM | POA: Diagnosis not present

## 2021-03-18 DIAGNOSIS — F1011 Alcohol abuse, in remission: Secondary | ICD-10-CM | POA: Diagnosis not present

## 2021-03-18 DIAGNOSIS — R911 Solitary pulmonary nodule: Secondary | ICD-10-CM | POA: Diagnosis not present

## 2021-03-18 DIAGNOSIS — Z923 Personal history of irradiation: Secondary | ICD-10-CM | POA: Diagnosis not present

## 2021-03-18 DIAGNOSIS — Z8546 Personal history of malignant neoplasm of prostate: Secondary | ICD-10-CM | POA: Diagnosis not present

## 2021-03-18 DIAGNOSIS — Z87891 Personal history of nicotine dependence: Secondary | ICD-10-CM | POA: Diagnosis not present

## 2021-03-18 DIAGNOSIS — F419 Anxiety disorder, unspecified: Secondary | ICD-10-CM | POA: Diagnosis not present

## 2021-03-18 DIAGNOSIS — J439 Emphysema, unspecified: Secondary | ICD-10-CM | POA: Diagnosis not present

## 2021-03-18 DIAGNOSIS — R634 Abnormal weight loss: Secondary | ICD-10-CM | POA: Diagnosis not present

## 2021-03-18 DIAGNOSIS — I2510 Atherosclerotic heart disease of native coronary artery without angina pectoris: Secondary | ICD-10-CM | POA: Diagnosis not present

## 2021-03-18 DIAGNOSIS — R627 Adult failure to thrive: Secondary | ICD-10-CM | POA: Diagnosis not present

## 2021-03-18 DIAGNOSIS — Z85828 Personal history of other malignant neoplasm of skin: Secondary | ICD-10-CM | POA: Diagnosis not present

## 2021-03-18 DIAGNOSIS — Z9981 Dependence on supplemental oxygen: Secondary | ICD-10-CM | POA: Diagnosis not present

## 2021-03-18 DIAGNOSIS — D649 Anemia, unspecified: Secondary | ICD-10-CM | POA: Diagnosis not present

## 2021-03-18 DIAGNOSIS — J9691 Respiratory failure, unspecified with hypoxia: Secondary | ICD-10-CM | POA: Diagnosis not present

## 2021-03-18 DIAGNOSIS — Z741 Need for assistance with personal care: Secondary | ICD-10-CM | POA: Diagnosis not present

## 2021-03-18 DIAGNOSIS — B1920 Unspecified viral hepatitis C without hepatic coma: Secondary | ICD-10-CM | POA: Diagnosis not present

## 2021-03-18 DIAGNOSIS — G629 Polyneuropathy, unspecified: Secondary | ICD-10-CM | POA: Diagnosis not present

## 2021-03-18 DIAGNOSIS — F32A Depression, unspecified: Secondary | ICD-10-CM | POA: Diagnosis not present

## 2021-03-18 DIAGNOSIS — Z681 Body mass index (BMI) 19 or less, adult: Secondary | ICD-10-CM | POA: Diagnosis not present

## 2021-03-18 DIAGNOSIS — I252 Old myocardial infarction: Secondary | ICD-10-CM | POA: Diagnosis not present

## 2021-03-18 DIAGNOSIS — R519 Headache, unspecified: Secondary | ICD-10-CM | POA: Diagnosis not present

## 2021-03-19 DIAGNOSIS — F419 Anxiety disorder, unspecified: Secondary | ICD-10-CM | POA: Diagnosis not present

## 2021-03-19 DIAGNOSIS — G629 Polyneuropathy, unspecified: Secondary | ICD-10-CM | POA: Diagnosis not present

## 2021-03-19 DIAGNOSIS — B1920 Unspecified viral hepatitis C without hepatic coma: Secondary | ICD-10-CM | POA: Diagnosis not present

## 2021-03-19 DIAGNOSIS — J439 Emphysema, unspecified: Secondary | ICD-10-CM | POA: Diagnosis not present

## 2021-03-19 DIAGNOSIS — J9691 Respiratory failure, unspecified with hypoxia: Secondary | ICD-10-CM | POA: Diagnosis not present

## 2021-03-19 DIAGNOSIS — J302 Other seasonal allergic rhinitis: Secondary | ICD-10-CM | POA: Diagnosis not present

## 2021-03-22 DIAGNOSIS — J9691 Respiratory failure, unspecified with hypoxia: Secondary | ICD-10-CM | POA: Diagnosis not present

## 2021-03-22 DIAGNOSIS — J439 Emphysema, unspecified: Secondary | ICD-10-CM | POA: Diagnosis not present

## 2021-03-22 DIAGNOSIS — B1920 Unspecified viral hepatitis C without hepatic coma: Secondary | ICD-10-CM | POA: Diagnosis not present

## 2021-03-22 DIAGNOSIS — J302 Other seasonal allergic rhinitis: Secondary | ICD-10-CM | POA: Diagnosis not present

## 2021-03-22 DIAGNOSIS — F419 Anxiety disorder, unspecified: Secondary | ICD-10-CM | POA: Diagnosis not present

## 2021-03-22 DIAGNOSIS — G629 Polyneuropathy, unspecified: Secondary | ICD-10-CM | POA: Diagnosis not present

## 2021-03-24 ENCOUNTER — Other Ambulatory Visit: Payer: Self-pay | Admitting: Nurse Practitioner

## 2021-03-24 NOTE — Telephone Encounter (Signed)
Pharmacy called in to cancel this request, stating pt has already requested it and this would be a duplicate. Please advise. ?

## 2021-03-24 NOTE — Telephone Encounter (Signed)
Duplicate request. ? ?Requested Prescriptions  ?Pending Prescriptions Disp Refills  ?? bisacodyl (DULCOLAX) 10 MG suppository [Pharmacy Med Name: BISACODYL '10MG'$  SUPPOSITORIES] 12 suppository   ?  Sig: INSERT RECTALLY TWICE DAILY AS NEEDED  ?  ? Gastroenterology:  Laxatives Passed - 03/24/2021  9:29 AM  ?  ?  Passed - Valid encounter within last 12 months  ?  Recent Outpatient Visits   ?      ? 7 months ago Chronic hepatitis ([place])  ? [facility] [place], [place] T, NP  ? 11 months ago COPD, very severe ([place])  ? [place], [place] T, NP  ? 1 year ago COPD, very severe ([place])  ? [place], [place] T, NP  ? 1 year ago COPD, very severe ([place])  ? [place], [place] T, NP  ? 1 year ago COPD, very severe ([place])  ? [facility] [place], Henrine Screws T, NP  ?  ?  ? ?  ?  ?  ? ? ?

## 2021-03-27 DIAGNOSIS — J9691 Respiratory failure, unspecified with hypoxia: Secondary | ICD-10-CM | POA: Diagnosis not present

## 2021-03-27 DIAGNOSIS — G629 Polyneuropathy, unspecified: Secondary | ICD-10-CM | POA: Diagnosis not present

## 2021-03-27 DIAGNOSIS — F419 Anxiety disorder, unspecified: Secondary | ICD-10-CM | POA: Diagnosis not present

## 2021-03-27 DIAGNOSIS — J439 Emphysema, unspecified: Secondary | ICD-10-CM | POA: Diagnosis not present

## 2021-03-27 DIAGNOSIS — B1920 Unspecified viral hepatitis C without hepatic coma: Secondary | ICD-10-CM | POA: Diagnosis not present

## 2021-03-27 DIAGNOSIS — J302 Other seasonal allergic rhinitis: Secondary | ICD-10-CM | POA: Diagnosis not present

## 2021-04-01 DIAGNOSIS — J9691 Respiratory failure, unspecified with hypoxia: Secondary | ICD-10-CM | POA: Diagnosis not present

## 2021-04-01 DIAGNOSIS — J302 Other seasonal allergic rhinitis: Secondary | ICD-10-CM | POA: Diagnosis not present

## 2021-04-01 DIAGNOSIS — J439 Emphysema, unspecified: Secondary | ICD-10-CM | POA: Diagnosis not present

## 2021-04-01 DIAGNOSIS — B1920 Unspecified viral hepatitis C without hepatic coma: Secondary | ICD-10-CM | POA: Diagnosis not present

## 2021-04-01 DIAGNOSIS — F419 Anxiety disorder, unspecified: Secondary | ICD-10-CM | POA: Diagnosis not present

## 2021-04-01 DIAGNOSIS — G629 Polyneuropathy, unspecified: Secondary | ICD-10-CM | POA: Diagnosis not present

## 2021-04-06 DIAGNOSIS — J439 Emphysema, unspecified: Secondary | ICD-10-CM | POA: Diagnosis not present

## 2021-04-06 DIAGNOSIS — J9691 Respiratory failure, unspecified with hypoxia: Secondary | ICD-10-CM | POA: Diagnosis not present

## 2021-04-06 DIAGNOSIS — F419 Anxiety disorder, unspecified: Secondary | ICD-10-CM | POA: Diagnosis not present

## 2021-04-06 DIAGNOSIS — B1920 Unspecified viral hepatitis C without hepatic coma: Secondary | ICD-10-CM | POA: Diagnosis not present

## 2021-04-06 DIAGNOSIS — G629 Polyneuropathy, unspecified: Secondary | ICD-10-CM | POA: Diagnosis not present

## 2021-04-06 DIAGNOSIS — J302 Other seasonal allergic rhinitis: Secondary | ICD-10-CM | POA: Diagnosis not present

## 2021-04-07 DIAGNOSIS — Z85828 Personal history of other malignant neoplasm of skin: Secondary | ICD-10-CM | POA: Diagnosis not present

## 2021-04-07 DIAGNOSIS — L570 Actinic keratosis: Secondary | ICD-10-CM | POA: Diagnosis not present

## 2021-04-07 DIAGNOSIS — B1920 Unspecified viral hepatitis C without hepatic coma: Secondary | ICD-10-CM | POA: Diagnosis not present

## 2021-04-07 DIAGNOSIS — L821 Other seborrheic keratosis: Secondary | ICD-10-CM | POA: Diagnosis not present

## 2021-04-07 DIAGNOSIS — Z9981 Dependence on supplemental oxygen: Secondary | ICD-10-CM | POA: Diagnosis not present

## 2021-04-07 DIAGNOSIS — L814 Other melanin hyperpigmentation: Secondary | ICD-10-CM | POA: Diagnosis not present

## 2021-04-07 DIAGNOSIS — C4442 Squamous cell carcinoma of skin of scalp and neck: Secondary | ICD-10-CM | POA: Diagnosis not present

## 2021-04-07 DIAGNOSIS — C61 Malignant neoplasm of prostate: Secondary | ICD-10-CM | POA: Diagnosis not present

## 2021-04-07 DIAGNOSIS — D492 Neoplasm of unspecified behavior of bone, soft tissue, and skin: Secondary | ICD-10-CM | POA: Diagnosis not present

## 2021-04-07 DIAGNOSIS — C329 Malignant neoplasm of larynx, unspecified: Secondary | ICD-10-CM | POA: Diagnosis not present

## 2021-04-07 DIAGNOSIS — J449 Chronic obstructive pulmonary disease, unspecified: Secondary | ICD-10-CM | POA: Diagnosis not present

## 2021-04-08 DIAGNOSIS — J439 Emphysema, unspecified: Secondary | ICD-10-CM | POA: Diagnosis not present

## 2021-04-15 DIAGNOSIS — Z20822 Contact with and (suspected) exposure to covid-19: Secondary | ICD-10-CM | POA: Diagnosis not present

## 2021-04-16 DIAGNOSIS — C4442 Squamous cell carcinoma of skin of scalp and neck: Secondary | ICD-10-CM | POA: Diagnosis not present

## 2021-04-16 DIAGNOSIS — D492 Neoplasm of unspecified behavior of bone, soft tissue, and skin: Secondary | ICD-10-CM | POA: Diagnosis not present

## 2021-04-16 DIAGNOSIS — J449 Chronic obstructive pulmonary disease, unspecified: Secondary | ICD-10-CM | POA: Diagnosis not present

## 2021-04-16 DIAGNOSIS — C61 Malignant neoplasm of prostate: Secondary | ICD-10-CM | POA: Diagnosis not present

## 2021-04-16 DIAGNOSIS — C329 Malignant neoplasm of larynx, unspecified: Secondary | ICD-10-CM | POA: Diagnosis not present

## 2021-04-17 ENCOUNTER — Encounter: Payer: Self-pay | Admitting: Oncology

## 2021-04-17 ENCOUNTER — Telehealth: Payer: Self-pay | Admitting: Student

## 2021-04-17 DIAGNOSIS — Z20822 Contact with and (suspected) exposure to covid-19: Secondary | ICD-10-CM | POA: Diagnosis not present

## 2021-04-17 NOTE — Telephone Encounter (Signed)
Spoke with patient and patient's significant other Vinie Sill, regarding the Palliative referral and after discussion and answering all questions they were in agreement with scheduling an In-home Consult for 04/24/21 @ 9:30 AM.   ?

## 2021-04-20 ENCOUNTER — Other Ambulatory Visit: Payer: Self-pay | Admitting: Nurse Practitioner

## 2021-04-21 NOTE — Telephone Encounter (Signed)
Requested medication (s) are due for refill today: yes ? ?Requested medication (s) are on the active medication list: yes ? ?Last refill:  01/23/21 #90/0 ? ?Future visit scheduled: no ? ?Notes to clinic:  Unable to refill per protocol, cannot delegate. ? ? ?  ?Requested Prescriptions  ?Pending Prescriptions Disp Refills  ? predniSONE (DELTASONE) 10 MG tablet [Pharmacy Med Name: PREDNISONE 10 MG TABLET] 30 tablet 2  ?  Sig: Take 1 tablet (10 mg total) by mouth daily with breakfast.  ?  ? Not Delegated - Endocrinology:  Oral Corticosteroids Failed - 04/20/2021  1:49 AM  ?  ?  Failed - This refill cannot be delegated  ?  ?  Failed - Manual Review: Eye exam for IOP if prolonged treatment  ?  ?  Failed - Glucose (serum) in normal range and within 180 days  ?  Glucose, Bld  ?Date Value Ref Range Status  ?08/31/2020 125 (H) 70 - 99 mg/dL Final  ?  Comment:  ?  Glucose reference range applies only to samples taken after fasting for at least 8 hours.  ? ?Glucose-Capillary  ?Date Value Ref Range Status  ?02/05/2020 91 70 - 99 mg/dL Final  ?  Comment:  ?  Glucose reference range applies only to samples taken after fasting for at least 8 hours.  ?  ?  ?  ?  Failed - K in normal range and within 180 days  ?  Potassium  ?Date Value Ref Range Status  ?08/31/2020 4.6 3.5 - 5.1 mmol/L Final  ?  ?  ?  ?  Failed - Na in normal range and within 180 days  ?  Sodium  ?Date Value Ref Range Status  ?08/31/2020 133 (L) 135 - 145 mmol/L Final  ?01/28/2020 141 134 - 144 mmol/L Final  ?  ?  ?  ?  Failed - Valid encounter within last 6 months  ?  Recent Outpatient Visits   ? ?      ? 8 months ago Chronic hepatitis ([place])  ? [facility] [place], [place] T, NP  ? 11 months ago COPD, very severe ([place])  ? [place], [place] T, NP  ? 1 year ago COPD, very severe ([place])  ? [place], [place] T, NP  ? 1 year ago COPD, very severe ([place])  ? [place], [place]wood T, NP  ? 1 year ago  COPD, very severe ([place])  ? [facility] [place], Henrine Screws T, NP  ? ?  ?  ? ?  ?  ?  Failed - Bone Mineral Density or Dexa Scan completed in the last 2 years  ?  ?  Passed - Last BP in normal range  ?  BP Readings from Last 1 Encounters:  ?09/02/20 110/61  ?  ?  ?  ?  ? ?

## 2021-04-24 ENCOUNTER — Encounter: Payer: Self-pay | Admitting: Oncology

## 2021-04-24 ENCOUNTER — Other Ambulatory Visit: Payer: Medicare Other | Admitting: Student

## 2021-04-24 DIAGNOSIS — T8149XA Infection following a procedure, other surgical site, initial encounter: Secondary | ICD-10-CM | POA: Diagnosis not present

## 2021-04-24 DIAGNOSIS — J441 Chronic obstructive pulmonary disease with (acute) exacerbation: Secondary | ICD-10-CM | POA: Diagnosis not present

## 2021-04-24 DIAGNOSIS — R0602 Shortness of breath: Secondary | ICD-10-CM | POA: Diagnosis not present

## 2021-04-24 DIAGNOSIS — J439 Emphysema, unspecified: Secondary | ICD-10-CM | POA: Diagnosis not present

## 2021-04-24 DIAGNOSIS — J309 Allergic rhinitis, unspecified: Secondary | ICD-10-CM

## 2021-04-24 DIAGNOSIS — C4492 Squamous cell carcinoma of skin, unspecified: Secondary | ICD-10-CM | POA: Diagnosis not present

## 2021-04-24 DIAGNOSIS — R52 Pain, unspecified: Secondary | ICD-10-CM

## 2021-04-24 DIAGNOSIS — Z515 Encounter for palliative care: Secondary | ICD-10-CM

## 2021-04-24 DIAGNOSIS — K5900 Constipation, unspecified: Secondary | ICD-10-CM | POA: Diagnosis not present

## 2021-04-24 DIAGNOSIS — L089 Local infection of the skin and subcutaneous tissue, unspecified: Secondary | ICD-10-CM | POA: Diagnosis not present

## 2021-04-24 NOTE — Progress Notes (Signed)
? ? ?Manufacturing engineer ?Community Palliative Care Consult Note ?Telephone: [phone]  ?Fax: [phone]  ? ?Date of encounter: 04/24/21 ?9:15 AM ?PATIENT NAME: Kevin Nelson ?[address] ?[place] [address]   ?[phone] (home)  ?DOB: 1950-06-20 ?MRN: [id number] ?PRIMARY CARE PROVIDER:    ?Alvester Chou, NP ? ?REFERRING PROVIDER:   ?Alvester Chou, NP ? ?RESPONSIBLE PARTY:    ?Contact Information   ? ? Name Relation Home Work Mobile  ? Bickford,Kyle Brother   [phone]  ? Honeycutt,Patti Significant other   [phone]  ? ?  ? ? ? ?I met face to face with patient and family in the home. Palliative Care was asked to follow this patient by consultation request of  Alvester Chou, NP to address advance care planning and complex medical decision making. This is the initial visit.  ? ? ?                                 ASSESSMENT AND PLAN / RECOMMENDATIONS:  ? ?Advance Care Planning/Goals of Care: Goals include to maximize quality of life and symptom management. Patient/health care surrogate gave his/her permission to discuss.Our advance care planning conversation included a discussion about:    ?The value and importance of advance care planning  ?Experiences with loved ones who have been seriously ill or have died  ?Exploration of personal, cultural or spiritual beliefs that might influence medical decisions  ?Exploration of goals of care in the event of a sudden injury or illness  ?CODE STATUS: DNR ? ?Education provided on palliative medicine vs. Hospice services. Patient is currently seeing dermatology due to a recent [facility] and revoked hospice to pursue treatment. He would like to pursue comfort path once he has followed up with dermatology. Will discuss with hospice medical director eligibility. Palliative Medicine will continue to provide ongoing support.  ? ?Symptom Management/Plan: ? ?COPD- patient with worsening shortness of breath. Continue oxygen at 4.5 lpm. Continue nebulizer as directed, clonazepam to  help with anxiety. Continue prednisone taper as directed. Morphine PRN dyspnea. ? ?Constipation- continue dulcolax suppository QD PRN. Encourage use of daily colace.  ? ?SCC- patient to be been by dermatology today due to wound site with worsening signs of infection to surgical wound site. He is currently on doxycycline. He would like to follow up with dermatology and then transition back to hospice services if possible.  ? ?Allergic rhinitis-continue fluticasone spray and saline spray as directed. Recommend cetirizine 10 mg daily.  ? ?Pain-patient with generalized pain, pain to [facility] site right posterior neck. Continue  Oxycodone IR 10 mg every 4 hours PRN ? ?Follow up Palliative Care Visit: Palliative care will continue to follow for complex medical decision making, advance care planning, and clarification of goals. Return in 4 weeks or prn. ? ? ?This visit was coded based on medical decision making (MDM). ? ?PPS: 50% ? ?HOSPICE ELIGIBILITY/DIAGNOSIS: TBD ? ?Chief Complaint: Palliative Medicine initial visit.  ? ?HISTORY OF PRESENT ILLNESS:  Kevin Nelson is a 71 y.o. year old male  with COPD with emphysema, hypoxic respiratory failure on supplemental oxygen, allergic rhinitis, hepatitis C s/p treatment 2019, neuropathy, anxiety, depression, CAD status post MI 2020, anemia, history of ETOH and tobacco use-quit 08/2020, history of multiple basal cell carcinoma of skin, chronic headache, history of prostate cancer 2018- treated with Lupron. History of SCC, treated with radiation and chemo, dysphasia, lung nodule, abnormal weight loss, AFTT.  ? ?  Recently revoked hospice services due to having skin cancer treatment. Had 54 skin cancers removed in the past 16 years. Endorses worsening shortness of breath; wears oxygen at 4.5 liters continuously. Currently on prednisone taper. He endorses increased weakness, more tired. Able to complete adl's, but now requiring more assistance. Taking longer to complete adl's, now only able  to shower once or twice a week. Worsening pain, chest pain now. Endorses constipation; suppository PRN. Having increased swelling to BLE. 135 pounds, down to 88 pounds, edentulous. Current weight 99. 2 pounds today. Endorses allergy symptoms.  ? ?Patient received resting in living room. He is wearing oxygen at 4.5 lpm via nasal canula. He does become short of breath with talking and standing up to stand on scale. Sats drop to 84% on room air when he briefly took off canula to remove his shirt to assess wound. Area to right posterior neck, pustules. Mild erythema, temperature 99.0. ? ? ?History obtained from review of EMR, discussion with primary team, and interview with family, facility staff/caregiver and/or Mr. Kevin Nelson.  ?I reviewed available labs, medications, imaging, studies and related documents from the EMR.  Records reviewed and summarized above.  ? ? ? ?Physical Exam: ?Weight 99.2 pounds.  ?pulse 82, b/p 140/62  ?Constitutional: NAD ?General: frail appearing, thin, ill appearing ?EYES: anicteric sclera, lids intact, no discharge  ?ENMT: intact hearing, oral mucous membranes moist ?CV: S1S2, RRR, no LE edema ?Pulmonary: Lungs diminished bilaterally, increased work of breathing, dry non-productive cough ?Abdomen: normo-active BS + 4 quadrants, soft and non tender, no ascites ?GU: deferred ?MSK: + sarcopenia, moves all extremities, ambulatory ?Skin: warm and dry, no rashes, surgical wound to right posterior neck, large amount of purulent drainage, mild erythema ?Neuro:  + generalized weakness,  no cognitive impairment ?Psych: non-anxious affect, A and O x 3 ?Hem/lymph/immuno: no widespread bruising ?CURRENT PROBLEM LIST:  ?Patient Active Problem List  ? Diagnosis Date Noted  ? Alcohol withdrawal syndrome with complication ([place])   ? DNR (do not resuscitate)/DNI(Do Not Intubate) 08/30/2020  ? COPD exacerbation ([place]) 08/30/2020  ? Slow transit constipation 07/31/2020  ? Neuropathy of both feet 07/31/2020  ? Poor  dentition 04/28/2020  ? B12 deficiency 11/29/2019  ? Goals of care, counseling/discussion 01/25/2019  ? Squamous cell carcinoma of larynx ([place]) 01/04/2019  ? Chronic nonintractable headache 11/10/2018  ? COPD with acute exacerbation ([place]) 09/29/2018  ? Sensorineural hearing loss (SNHL) 08/21/2018  ? Aortic atherosclerosis ([place]) 07/31/2018  ? Chronic hepatitis ([place]) 04/21/2018  ? Lung nodule < 6cm on CT 04/19/2018  ? Senile purpura ([place]) 04/19/2018  ? Protein-calorie malnutrition ([place]) 04/19/2018  ? Nicotine dependence, cigarettes, uncomplicated 24/58/0998  ? Controlled substance agreement signed 01/27/2018  ? Chronic respiratory failure with hypoxia ([place]) 01/26/2018  ? End stage COPD ([place]) 01/13/2018  ? Prostate cancer ([place]) 01/13/2018  ? History of basal cell carcinoma (BCC) 01/13/2018  ? Generalized anxiety disorder 01/13/2018  ? ?PAST MEDICAL HISTORY:  ?Active Ambulatory Problems  ?  Diagnosis Date Noted  ? End stage COPD ([place]) 01/13/2018  ? Prostate cancer ([place]) 01/13/2018  ? History of basal cell carcinoma (BCC) 01/13/2018  ? Generalized anxiety disorder 01/13/2018  ? Chronic respiratory failure with hypoxia ([place]) 01/26/2018  ? Nicotine dependence, cigarettes, uncomplicated 33/82/5053  ? Controlled substance agreement signed 01/27/2018  ? Lung nodule < 6cm on CT 04/19/2018  ? Senile purpura ([place]) 04/19/2018  ? Protein-calorie malnutrition ([place]) 04/19/2018  ? Chronic hepatitis ([place]) 04/21/2018  ? Aortic atherosclerosis ([place]) 07/31/2018  ? Sensorineural  hearing loss (SNHL) 08/21/2018  ? COPD with acute exacerbation ([place]) 09/29/2018  ? Chronic nonintractable headache 11/10/2018  ? Squamous cell carcinoma of larynx ([place]) 01/04/2019  ? Goals of care, counseling/discussion 01/25/2019  ? B12 deficiency 11/29/2019  ? Poor dentition 04/28/2020  ? Slow transit constipation 07/31/2020  ? Neuropathy of both feet 07/31/2020  ? DNR (do not resuscitate)/DNI(Do Not Intubate) 08/30/2020  ? COPD exacerbation ([place]) 08/30/2020  ?  Alcohol withdrawal syndrome with complication ([place])   ? ?Resolved Ambulatory Problems  ?  Diagnosis Date Noted  ? COPD exacerbation ([place]) 04/15/2018  ? COPD (chronic obstructive pulmonary disease) ([place]) 0

## 2021-04-28 ENCOUNTER — Telehealth: Payer: Self-pay | Admitting: *Deleted

## 2021-04-28 DIAGNOSIS — G621 Alcoholic polyneuropathy: Secondary | ICD-10-CM | POA: Diagnosis not present

## 2021-04-28 DIAGNOSIS — J449 Chronic obstructive pulmonary disease, unspecified: Secondary | ICD-10-CM | POA: Diagnosis not present

## 2021-04-28 NOTE — Telephone Encounter (Signed)
1:37p ?Received a communication that Alvester Chou NP called stating that patient still has [facility] oxygen in the home from when he was under hospice care. Patient revoked hospice services on 04/06/21. An order and F2F note was sent by hospice to [facility], however [facility] has not delivered anything to patient as of yet. Patient is now wanting his oxygen from [place]. I spoke with my supervisor who advised me to contact hospice and request that they send the order to [facility] as patient requested. Communication sent to hospice with this request.  ?

## 2021-04-29 DIAGNOSIS — Z85828 Personal history of other malignant neoplasm of skin: Secondary | ICD-10-CM | POA: Diagnosis not present

## 2021-04-29 DIAGNOSIS — Z20822 Contact with and (suspected) exposure to covid-19: Secondary | ICD-10-CM | POA: Diagnosis not present

## 2021-04-29 DIAGNOSIS — T8149XA Infection following a procedure, other surgical site, initial encounter: Secondary | ICD-10-CM | POA: Diagnosis not present

## 2021-05-04 DIAGNOSIS — Z20822 Contact with and (suspected) exposure to covid-19: Secondary | ICD-10-CM | POA: Diagnosis not present

## 2021-05-05 DIAGNOSIS — Z85828 Personal history of other malignant neoplasm of skin: Secondary | ICD-10-CM | POA: Diagnosis not present

## 2021-05-05 DIAGNOSIS — T8149XA Infection following a procedure, other surgical site, initial encounter: Secondary | ICD-10-CM | POA: Diagnosis not present

## 2021-05-05 DIAGNOSIS — Z79899 Other long term (current) drug therapy: Secondary | ICD-10-CM | POA: Diagnosis not present

## 2021-05-08 ENCOUNTER — Other Ambulatory Visit: Payer: Medicare Other | Admitting: Student

## 2021-05-08 DIAGNOSIS — Z515 Encounter for palliative care: Secondary | ICD-10-CM | POA: Diagnosis not present

## 2021-05-08 DIAGNOSIS — C4492 Squamous cell carcinoma of skin, unspecified: Secondary | ICD-10-CM

## 2021-05-08 DIAGNOSIS — A4902 Methicillin resistant Staphylococcus aureus infection, unspecified site: Secondary | ICD-10-CM

## 2021-05-08 DIAGNOSIS — R52 Pain, unspecified: Secondary | ICD-10-CM | POA: Diagnosis not present

## 2021-05-08 DIAGNOSIS — J449 Chronic obstructive pulmonary disease, unspecified: Secondary | ICD-10-CM | POA: Diagnosis not present

## 2021-05-08 NOTE — Progress Notes (Signed)
? ? ?Manufacturing engineer ?Community Palliative Care Consult Note ?Telephone: [phone]  ?Fax: [phone]  ? ? ?Date of encounter: 05/08/21 ?9:47 AM ?PATIENT NAME: Ziv Welchel ?[address] ?[place] [address]   ?[phone] (home)  ?DOB: Aug 29, 1950 ?MRN: [id number] ?PRIMARY CARE PROVIDER:    ?[name], NP,  ?[address] [place] [address] ?[phone] ? ?REFERRING PROVIDER:   ?[name], NP ?[address] [place],  [place] [address] ?[phone] ? ?RESPONSIBLE PARTY:    ?Contact Information   ? ? Name Relation Home Work Mobile  ? Gumbs,Kyle Brother   [phone]  ? Honeycutt,Patti Significant other   [phone]  ? ?  ? ? ? ?I met face to face with patient and family in the home. Palliative Care was asked to follow this patient by consultation request of  [name], NP to address advance care planning and complex medical decision making. This is a follow up visit. ? ?                                 ASSESSMENT AND PLAN / RECOMMENDATIONS:  ? ?Advance Care Planning/Goals of Care: Goals include to maximize quality of life and symptom management. Patient/health care surrogate gave his/her permission to discuss. ?Our advance care planning conversation included a discussion about:    ?The value and importance of advance care planning  ?Experiences with loved ones who have been seriously ill or have died  ?Exploration of personal, cultural or spiritual beliefs that might influence medical decisions  ?Exploration of goals of care in the event of a sudden injury or illness  ?CODE STATUS: DNR ? ?Palliative Medicine will continue to provide ongoing support, symptom management. Will monitor for changes and declines; will refer back to hospice when he meets criteria and no longer seeking treatment for his SCC.  ? ?Symptom Management/Plan: ? ?COPD- shortness of breath stable since last visit. Continue oxygen at 4-4.5 lpm. Continue nebulizer as directed, clonazepam to help with anxiety.  Continue prednisone taper as directed. Morphine PRN dyspnea. ? ?Weight loss-patient endorses a good appetite, although he has loss 5 pounds in past month. He is encouraged to eat foods he enjoys. Patient declines milk based supplements. We also discussed clear supplements. He does report increased swallowing difficulty; soft foods encouraged.  ? ?Wound- patient s/p surgical excision of SCC to right posterior neck. Wound with infection, culture showed MRSA. He is currently on bactrim  40 ml BID for a total of 14 days, Bactroban TID x 7 days. He is to follow up with dermatology as scheduled.  ? ?Pain-patient with generalized pain, pain to [facility] site right posterior neck. Continue  Oxycodone IR 10 mg every 4 hours PRN ?  ?Patient reports his EBT was decreased and has been able to discuss with Social Services. Will refer to palliative SW.  ? ?Follow up Palliative Care Visit: Palliative care will continue to follow for complex medical decision making, advance care planning, and clarification of goals. Return in 4 weeks or prn. ? ? ?This visit was coded based on medical decision making (MDM). ? ?PPS: 50% ? ?HOSPICE ELIGIBILITY/DIAGNOSIS: TBD ? ?Chief Complaint: Palliative Medicine follow up visit.  ? ?HISTORY OF PRESENT ILLNESS:  Maveryk Renstrom is a 71 y.o. year old male  with COPD with emphysema, hypoxic respiratory failure on supplemental oxygen, allergic rhinitis, hepatitis C s/p treatment 2019, neuropathy, anxiety, depression, CAD status post  MI 2020, anemia, history of ETOH and tobacco use-quit 08/2020, history of multiple basal cell carcinoma of skin, chronic headache, history of prostate cancer 2018- treated with Lupron. History of SCC, treated with radiation and chemo, dysphasia, lung nodule, abnormal weight loss, AFTT.  ? ?Pain is better; appetite improving. Follow up weekly with dermatology. Wound has MRSA; taking sulfamethoxazole TMP 49m bid x 10 days, 14 days total. Breathing has been stable; currently on 4 lpm.  Patient expresses being upset that his EBT was decreased from $264 to $64 a month. A 10-point ROS is negative, except for the pertinent positives and negatives detailed per the HPI.  ? ?History obtained from review of EMR, discussion with primary team, and interview with family, facility staff/caregiver and/or Mr. HMcdonagh  ?I reviewed available labs, medications, imaging, studies and related documents from the EMR.  Records reviewed and summarized above.  ? ?Physical Exam: ?Weight: 94 pounds ?Pulse 104, resp 20, bp 120/68, sats 93 % on 4 lpm ?Constitutional: NAD ?General: frail appearing, thin ?EYES: anicteric sclera, lids intact, no discharge  ?ENMT: intact hearing, oral mucous membranes moist, dentition intact ?CV: S1S2, RRR, no LE edema ?Pulmonary: Lungs diminished bilaterally, increased work of breathing with exertion, cough ?Abdomen: normo-active BS + 4 quadrants, soft and non tender, no ascites ?GU: deferred ?MSK: + sarcopenia, moves all extremities, ambulatory ?Skin: warm and dry, no rashes or wounds on visible skin ?Neuro:  +generalized weakness,  no cognitive impairment ?Psych: non-anxious affect, A and O x 3 ?Hem/lymph/immuno: no widespread bruising ? ? ?Thank you for the opportunity to participate in the care of Mr. HBorghi  The palliative care team will continue to follow. Please call our office at [phone] we can be of additional assistance.  ? ?LEzekiel Slocumb NP  ? ?COVID-19 PATIENT SCREENING TOOL ?Asked and negative response unless otherwise noted:  ? ?Have you had symptoms of covid, tested positive or been in contact with someone with symptoms/positive test in the past 5-10 days? No ? ?

## 2021-05-12 DIAGNOSIS — T8149XA Infection following a procedure, other surgical site, initial encounter: Secondary | ICD-10-CM | POA: Diagnosis not present

## 2021-05-13 ENCOUNTER — Telehealth: Payer: Self-pay

## 2021-05-13 DIAGNOSIS — Z20822 Contact with and (suspected) exposure to covid-19: Secondary | ICD-10-CM | POA: Diagnosis not present

## 2021-05-13 NOTE — Telephone Encounter (Signed)
PC SW outreached patients significant, Kevin Nelson, per [facility] NP - [place]. [place] SW referral request, to assess needs and discuss patients decrease in monthly earnings in food stamps. ? ?Call unsuccessful. SW LVM advising patient and significant other to continue to outreach local DSS office, PC SW does not have any other direct contacts to supply to patient. PC SW also encouraged patient to appeal the decrease if it was calculated incorrectly. SW left contact info in VM. ?

## 2021-05-22 DIAGNOSIS — Z20822 Contact with and (suspected) exposure to covid-19: Secondary | ICD-10-CM | POA: Diagnosis not present

## 2021-05-25 DIAGNOSIS — Z20822 Contact with and (suspected) exposure to covid-19: Secondary | ICD-10-CM | POA: Diagnosis not present

## 2021-06-01 DIAGNOSIS — I6782 Cerebral ischemia: Secondary | ICD-10-CM | POA: Diagnosis not present

## 2021-06-01 DIAGNOSIS — J9611 Chronic respiratory failure with hypoxia: Secondary | ICD-10-CM | POA: Diagnosis not present

## 2021-06-01 DIAGNOSIS — G931 Anoxic brain damage, not elsewhere classified: Secondary | ICD-10-CM | POA: Diagnosis not present

## 2021-06-01 DIAGNOSIS — R0609 Other forms of dyspnea: Secondary | ICD-10-CM | POA: Diagnosis not present

## 2021-06-01 DIAGNOSIS — J449 Chronic obstructive pulmonary disease, unspecified: Secondary | ICD-10-CM | POA: Diagnosis not present

## 2021-06-11 ENCOUNTER — Other Ambulatory Visit: Payer: Medicare Other | Admitting: Student

## 2021-06-11 DIAGNOSIS — R0602 Shortness of breath: Secondary | ICD-10-CM | POA: Diagnosis not present

## 2021-06-11 DIAGNOSIS — E43 Unspecified severe protein-calorie malnutrition: Secondary | ICD-10-CM

## 2021-06-11 DIAGNOSIS — Z515 Encounter for palliative care: Secondary | ICD-10-CM | POA: Diagnosis not present

## 2021-06-11 DIAGNOSIS — R490 Dysphonia: Secondary | ICD-10-CM

## 2021-06-11 DIAGNOSIS — J449 Chronic obstructive pulmonary disease, unspecified: Secondary | ICD-10-CM

## 2021-06-11 NOTE — Progress Notes (Signed)
[facility] Consult Note Telephone: [phone]  Fax: [phone]    Date of encounter: 06/11/21 9:16 AM PATIENT NAME: Kevin Nelson [place] [address]   [phone] (home)  DOB: 09-05-1950 MRN: [id number] PRIMARY CARE PROVIDER:    [name], NP,  [place] [address] [phone]  REFERRING PROVIDER:   Alvester Chou, NP  RESPONSIBLE PARTY:    Contact Information     Name Relation Home Work Mobile   Swader,Kyle Brother   [phone]   [place] other   [phone]        I met face to face with patient and family in the home. Palliative Care was asked to follow this patient by consultation request of Alvester Chou, NP  to address advance care planning and complex medical decision making. This is a follow up visit.                                   ASSESSMENT AND PLAN / RECOMMENDATIONS:   Advance Care Planning/Goals of Care: Goals include to maximize quality of life and symptom management. Patient/health care surrogate gave his/her permission to discuss. Our advance care planning conversation included a discussion about:    The value and importance of advance care planning  Experiences with loved ones who have been seriously ill or have died  Exploration of personal, cultural or spiritual beliefs that might influence medical decisions  Exploration of goals of care in the event of a sudden injury or illness  CODE STATUS: DNR  Palliative Medicine will continue to provide ongoing support, symptom management. Will monitor for changes and declines; will refer back to hospice when he meets eligibility criteria.  Symptom Management/Plan:  COPD-patient feels his shortness of breath is getting worse; he is now wearing oxygen at 5 lpm. PFT showing severe COPD. He is having increased weakness, difficulty using pulsated regulator with portable tanks. While at pulmonology appointment,  his sats were 79% at 5 lpm. He was started on Trelegy, continue as directed; albuterol and prednisone as directed. Continue morphine prn shortness of breath as directed; mostly takes at night. He requires more assistance with adl's at times. Follow up with pulmonology as scheduled.   Protein calorie malnutrition-patient's weight today is 93.4 pounds; he was 94 pounds on last visit. He still endorses a good appetite. Although he is having increased swallowing difficulty.   Hoarseness-may be attributed to new Trelegy inhaler. He is encouraged to rinse mouth out after each use; encourage adequate fluids. No thrush. Encouraged to eat yogurt, but declines.   Pain-patient with generalized pain. Continue  Oxycodone IR 10 mg every 4 hours PRN.  Follow up Palliative Care Visit: Palliative care will continue to follow for complex medical decision making, advance care planning, and clarification of goals. Return in 4 weeks or prn.   This visit was coded based on medical decision making (MDM).  PPS: 40%  HOSPICE ELIGIBILITY/DIAGNOSIS: TBD  Chief Complaint: Palliative Medicine follow up visit.   HISTORY OF PRESENT ILLNESS:  Kevin Nelson is a 71 y.o. year old male  with COPD with emphysema, hypoxic respiratory failure on supplemental oxygen, allergic rhinitis, hepatitis C s/p treatment 2019, neuropathy, anxiety, depression, CAD status post MI 2020, anemia, history of ETOH and tobacco use-quit 08/2020, history of multiple basal cell carcinoma of skin, chronic headache, history of prostate cancer 2018- treated with  Lupron.   Has hoarseness, denies sore throat. Patient completed Augmentin yesterday; he had worsening productive cough. Cough has improved. Wearing oxygen at 5 lpm. Is having increased difficulty with pulse regulator, sats were 79% on when he went to pulmonologist. F/u with pulmonologist on 07/08/21. Breathing is getting worse, weaker. Some days needing more assistance with adl's. No falls. Appetite is  good. Taking clonazepam taking 2 at night, no more than 3 a day. Oxycodone 10 mg every 4 hours PRN. Endorses pain all over. Taking morphine at night for dyspnea. SCC site has healed. A 10-point ROS is negative, except for the pertinent positives and negatives detailed per the HPI.   History obtained from review of EMR, discussion with primary team, and interview with family, facility staff/caregiver and/or Kevin Nelson.  I reviewed available labs, medications, imaging, studies and related documents from the EMR.  Records reviewed and summarized above.   Physical Exam: Pulse 78, resp 20, b/p 110/62, sats 94% on 5 lpm Constitutional: NAD General: frail appearing, thin, chronically ill appearing EYES: anicteric sclera, lids intact, no discharge  ENMT: intact hearing, oral mucous membranes moist, dentition intact CV: S1S2, RRR, no LE edema Pulmonary: Lungs diminished bilaterally, increased work of breathing, cough Abdomen: normo-active BS + 4 quadrants, soft and non tender, no ascites GU: deferred MSK: sarcopenia, moves all extremities, ambulatory Skin: warm and dry, no rashes or wounds on visible skin Neuro: generalized weakness,  no cognitive impairment Psych: non-anxious affect, A and O x 3 Hem/lymph/immuno: no widespread bruising   Thank you for the opportunity to participate in the care of Kevin Nelson.  The palliative care team will continue to follow. Please call our office at [phone] if we can be of additional assistance.   Ezekiel Slocumb, NP   COVID-19 PATIENT SCREENING TOOL Asked and negative response unless otherwise noted:   Have you had symptoms of covid, tested positive or been in contact with someone with symptoms/positive test in the past 5-10 days? No

## 2021-06-21 ENCOUNTER — Other Ambulatory Visit: Payer: Self-pay

## 2021-06-21 ENCOUNTER — Inpatient Hospital Stay
Admission: EM | Admit: 2021-06-21 | Discharge: 2021-07-18 | DRG: 193 | Disposition: E | Payer: Medicare Other | Attending: Internal Medicine | Admitting: Internal Medicine

## 2021-06-21 ENCOUNTER — Encounter: Payer: Self-pay | Admitting: Radiology

## 2021-06-21 ENCOUNTER — Emergency Department: Payer: Medicare Other

## 2021-06-21 DIAGNOSIS — J432 Centrilobular emphysema: Secondary | ICD-10-CM | POA: Diagnosis present

## 2021-06-21 DIAGNOSIS — R778 Other specified abnormalities of plasma proteins: Secondary | ICD-10-CM | POA: Diagnosis present

## 2021-06-21 DIAGNOSIS — R1310 Dysphagia, unspecified: Secondary | ICD-10-CM | POA: Diagnosis present

## 2021-06-21 DIAGNOSIS — R627 Adult failure to thrive: Secondary | ICD-10-CM | POA: Diagnosis present

## 2021-06-21 DIAGNOSIS — C329 Malignant neoplasm of larynx, unspecified: Secondary | ICD-10-CM | POA: Diagnosis present

## 2021-06-21 DIAGNOSIS — G4733 Obstructive sleep apnea (adult) (pediatric): Secondary | ICD-10-CM | POA: Diagnosis present

## 2021-06-21 DIAGNOSIS — R0602 Shortness of breath: Secondary | ICD-10-CM | POA: Diagnosis not present

## 2021-06-21 DIAGNOSIS — D72829 Elevated white blood cell count, unspecified: Secondary | ICD-10-CM | POA: Diagnosis present

## 2021-06-21 DIAGNOSIS — R0603 Acute respiratory distress: Secondary | ICD-10-CM | POA: Diagnosis not present

## 2021-06-21 DIAGNOSIS — Z515 Encounter for palliative care: Secondary | ICD-10-CM | POA: Diagnosis not present

## 2021-06-21 DIAGNOSIS — Z85828 Personal history of other malignant neoplasm of skin: Secondary | ICD-10-CM | POA: Diagnosis not present

## 2021-06-21 DIAGNOSIS — J9621 Acute and chronic respiratory failure with hypoxia: Secondary | ICD-10-CM | POA: Diagnosis present

## 2021-06-21 DIAGNOSIS — D62 Acute posthemorrhagic anemia: Secondary | ICD-10-CM | POA: Diagnosis present

## 2021-06-21 DIAGNOSIS — Z923 Personal history of irradiation: Secondary | ICD-10-CM | POA: Diagnosis not present

## 2021-06-21 DIAGNOSIS — J9622 Acute and chronic respiratory failure with hypercapnia: Secondary | ICD-10-CM | POA: Diagnosis present

## 2021-06-21 DIAGNOSIS — Z8249 Family history of ischemic heart disease and other diseases of the circulatory system: Secondary | ICD-10-CM

## 2021-06-21 DIAGNOSIS — Z9981 Dependence on supplemental oxygen: Secondary | ICD-10-CM

## 2021-06-21 DIAGNOSIS — F17210 Nicotine dependence, cigarettes, uncomplicated: Secondary | ICD-10-CM | POA: Diagnosis present

## 2021-06-21 DIAGNOSIS — Z66 Do not resuscitate: Secondary | ICD-10-CM | POA: Diagnosis present

## 2021-06-21 DIAGNOSIS — K029 Dental caries, unspecified: Secondary | ICD-10-CM | POA: Diagnosis present

## 2021-06-21 DIAGNOSIS — R64 Cachexia: Secondary | ICD-10-CM | POA: Diagnosis present

## 2021-06-21 DIAGNOSIS — Z79899 Other long term (current) drug therapy: Secondary | ICD-10-CM

## 2021-06-21 DIAGNOSIS — N529 Male erectile dysfunction, unspecified: Secondary | ICD-10-CM | POA: Diagnosis present

## 2021-06-21 DIAGNOSIS — J439 Emphysema, unspecified: Secondary | ICD-10-CM | POA: Diagnosis not present

## 2021-06-21 DIAGNOSIS — R0689 Other abnormalities of breathing: Secondary | ICD-10-CM | POA: Diagnosis not present

## 2021-06-21 DIAGNOSIS — R0902 Hypoxemia: Secondary | ICD-10-CM | POA: Diagnosis not present

## 2021-06-21 DIAGNOSIS — F1020 Alcohol dependence, uncomplicated: Secondary | ICD-10-CM | POA: Diagnosis present

## 2021-06-21 DIAGNOSIS — Z681 Body mass index (BMI) 19 or less, adult: Secondary | ICD-10-CM

## 2021-06-21 DIAGNOSIS — B1920 Unspecified viral hepatitis C without hepatic coma: Secondary | ICD-10-CM | POA: Diagnosis present

## 2021-06-21 DIAGNOSIS — Z8546 Personal history of malignant neoplasm of prostate: Secondary | ICD-10-CM

## 2021-06-21 DIAGNOSIS — Z20822 Contact with and (suspected) exposure to covid-19: Secondary | ICD-10-CM | POA: Diagnosis present

## 2021-06-21 DIAGNOSIS — R636 Underweight: Secondary | ICD-10-CM | POA: Diagnosis present

## 2021-06-21 DIAGNOSIS — F419 Anxiety disorder, unspecified: Secondary | ICD-10-CM | POA: Diagnosis present

## 2021-06-21 DIAGNOSIS — G9349 Other encephalopathy: Secondary | ICD-10-CM | POA: Diagnosis present

## 2021-06-21 DIAGNOSIS — I252 Old myocardial infarction: Secondary | ICD-10-CM

## 2021-06-21 DIAGNOSIS — Z809 Family history of malignant neoplasm, unspecified: Secondary | ICD-10-CM

## 2021-06-21 DIAGNOSIS — Z79891 Long term (current) use of opiate analgesic: Secondary | ICD-10-CM

## 2021-06-21 DIAGNOSIS — J189 Pneumonia, unspecified organism: Secondary | ICD-10-CM | POA: Diagnosis not present

## 2021-06-21 DIAGNOSIS — J9620 Acute and chronic respiratory failure, unspecified whether with hypoxia or hypercapnia: Secondary | ICD-10-CM | POA: Diagnosis present

## 2021-06-21 DIAGNOSIS — R000 Tachycardia, unspecified: Secondary | ICD-10-CM | POA: Diagnosis not present

## 2021-06-21 DIAGNOSIS — R069 Unspecified abnormalities of breathing: Secondary | ICD-10-CM | POA: Diagnosis not present

## 2021-06-21 LAB — BRAIN NATRIURETIC PEPTIDE: B Natriuretic Peptide: 107.4 pg/mL — ABNORMAL HIGH (ref 0.0–100.0)

## 2021-06-21 LAB — RESPIRATORY PANEL BY PCR

## 2021-06-21 LAB — COMPREHENSIVE METABOLIC PANEL
ALT: 10 U/L (ref 0–44)
AST: 21 U/L (ref 15–41)
Albumin: 3.4 g/dL — ABNORMAL LOW (ref 3.5–5.0)
Alkaline Phosphatase: 36 U/L — ABNORMAL LOW (ref 38–126)
Anion gap: 4 — ABNORMAL LOW (ref 5–15)
BUN: 16 mg/dL (ref 8–23)
CO2: 40 mmol/L — ABNORMAL HIGH (ref 22–32)
Calcium: 7.8 mg/dL — ABNORMAL LOW (ref 8.9–10.3)
Chloride: 96 mmol/L — ABNORMAL LOW (ref 98–111)
Creatinine, Ser: 0.52 mg/dL — ABNORMAL LOW (ref 0.61–1.24)
GFR, Estimated: >60 mL/min (ref >60)
Glucose, Bld: 146 mg/dL — ABNORMAL HIGH (ref 70–99)
Potassium: 4.6 mmol/L (ref 3.5–5.1)
Sodium: 140 mmol/L (ref 135–145)
Total Bilirubin: 0.8 mg/dL (ref 0.3–1.2)
Total Protein: 6.1 g/dL — ABNORMAL LOW (ref 6.5–8.1)

## 2021-06-21 LAB — CBC WITH DIFFERENTIAL/PLATELET
Abs Immature Granulocytes: 0.04 10*3/uL (ref 0.00–0.07)
Basophils Absolute: 0 10*3/uL (ref 0.0–0.1)
Basophils Relative: 0 %
Eosinophils Absolute: 0.1 10*3/uL (ref 0.0–0.5)
Eosinophils Relative: 0 %
HCT: 34.6 % — ABNORMAL LOW (ref 39.0–52.0)
Hemoglobin: 9.9 g/dL — ABNORMAL LOW (ref 13.0–17.0)
Immature Granulocytes: 0 %
Lymphocytes Relative: 10 %
Lymphs Abs: 1.4 10*3/uL (ref 0.7–4.0)
MCH: 25.8 pg — ABNORMAL LOW (ref 26.0–34.0)
MCHC: 28.6 g/dL — ABNORMAL LOW (ref 30.0–36.0)
MCV: 90.3 fL (ref 80.0–100.0)
Monocytes Absolute: 0.1 10*3/uL (ref 0.1–1.0)
Monocytes Relative: 0 %
Neutro Abs: 12 10*3/uL — ABNORMAL HIGH (ref 1.7–7.7)
Neutrophils Relative %: 90 %
Platelets: 345 10*3/uL (ref 150–400)
RBC: 3.83 MIL/uL — ABNORMAL LOW (ref 4.22–5.81)
RDW: 14.8 % (ref 11.5–15.5)
WBC: 13.3 10*3/uL — ABNORMAL HIGH (ref 4.0–10.5)
nRBC: 0 % (ref 0.0–0.2)

## 2021-06-21 LAB — BLOOD GAS, VENOUS
Acid-Base Excess: 1.8 mmol/L (ref 0.0–2.0)
Bicarbonate: 36.2 mmol/L — ABNORMAL HIGH (ref 20.0–28.0)
Delivery systems: POSITIVE
FIO2: 65 %
MECHVT: 500 mL
O2 Saturation: 29.1 %
Patient temperature: 37
pCO2, Ven: >123 mmHg (ref 44–60)
pH, Ven: 7.06 — CL (ref 7.25–7.43)
pO2, Ven: <31 mmHg — CL (ref 32–45)

## 2021-06-21 LAB — LACTIC ACID, PLASMA
Lactic Acid, Venous: 1.3 mmol/L (ref 0.5–1.9)
Lactic Acid, Venous: 1.2 mmol/L (ref 0.5–1.9)

## 2021-06-21 LAB — GLUCOSE, CAPILLARY: Glucose-Capillary: 126 mg/dL — ABNORMAL HIGH (ref 70–99)

## 2021-06-21 LAB — RESP PANEL BY RT-PCR (FLU A&B, COVID) ARPGX2
Influenza A by PCR: NEGATIVE
Influenza B by PCR: NEGATIVE
SARS Coronavirus 2 by RT PCR: NEGATIVE

## 2021-06-21 LAB — PROCALCITONIN: Procalcitonin: 1.5 ng/mL

## 2021-06-21 LAB — TROPONIN I (HIGH SENSITIVITY)
Troponin I (High Sensitivity): 26 ng/L — ABNORMAL HIGH (ref <18)
Troponin I (High Sensitivity): 292 ng/L (ref <18)

## 2021-06-21 LAB — MRSA NEXT GEN BY PCR, NASAL: MRSA by PCR Next Gen: NOT DETECTED

## 2021-06-21 MED ORDER — IPRATROPIUM-ALBUTEROL 0.5-2.5 (3) MG/3ML IN SOLN: 3.0000 mL | Freq: Once | RESPIRATORY_TRACT | Status: AC

## 2021-06-21 MED ORDER — SODIUM CHLORIDE 0.9 % IV SOLN
500.0000 mg | Freq: Every day | INTRAVENOUS | Status: DC
  Filled 2021-06-21: qty 5

## 2021-06-21 MED ORDER — METHYLPREDNISOLONE SODIUM SUCC 40 MG IJ SOLR
40.0000 mg | Freq: Two times a day (BID) | INTRAMUSCULAR | Status: DC
Start: 2021-06-21 — End: 2021-06-22
  Administered 2021-06-21 – 2021-06-22 (×3): 40 mg via INTRAVENOUS
  Filled 2021-06-21 (×3): qty 1

## 2021-06-21 MED ORDER — LEVOFLOXACIN IN D5W 500 MG/100ML IV SOLN: 500.0000 mg | INTRAVENOUS | Status: DC

## 2021-06-21 MED ORDER — ENOXAPARIN SODIUM 30 MG/0.3ML IJ SOSY
30.0000 mg | PREFILLED_SYRINGE | INTRAMUSCULAR | Status: DC
  Administered 2021-06-21: 30 mg via SUBCUTANEOUS
  Filled 2021-06-21: qty 0.3

## 2021-06-21 MED ORDER — CHLORHEXIDINE GLUCONATE CLOTH 2 % EX PADS
6.0000 | MEDICATED_PAD | Freq: Every day | CUTANEOUS | Status: DC
  Administered 2021-06-21 – 2021-06-22 (×2): 6 via TOPICAL

## 2021-06-21 MED ORDER — LEVOFLOXACIN IN D5W 750 MG/150ML IV SOLN
750.0000 mg | INTRAVENOUS | Status: DC
Start: 2021-06-22 — End: 2021-06-21

## 2021-06-21 MED ORDER — POLYETHYLENE GLYCOL 3350 17 G PO PACK: 17.0000 g | PACK | Freq: Every day | ORAL | Status: DC | PRN

## 2021-06-21 MED ORDER — BUDESONIDE 0.5 MG/2ML IN SUSP
0.5000 mg | Freq: Two times a day (BID) | RESPIRATORY_TRACT | Status: DC
Start: 2021-06-21 — End: 2021-06-21

## 2021-06-21 MED ORDER — MORPHINE 100MG IN NS 100ML (1MG/ML) PREMIX INFUSION
1.0000 mg/h | INTRAVENOUS | Status: DC
  Administered 2021-06-21: 1 mg/h via INTRAVENOUS
  Filled 2021-06-21 (×2): qty 100

## 2021-06-21 MED ORDER — IPRATROPIUM-ALBUTEROL 0.5-2.5 (3) MG/3ML IN SOLN
3.0000 mL | Freq: Once | RESPIRATORY_TRACT | Status: AC
  Administered 2021-06-21: 3 mL via RESPIRATORY_TRACT

## 2021-06-21 MED ORDER — LACTATED RINGERS IV BOLUS
500.0000 mL | Freq: Once | INTRAVENOUS | Status: AC
  Administered 2021-06-21: 500 mL via INTRAVENOUS

## 2021-06-21 MED ORDER — IPRATROPIUM-ALBUTEROL 0.5-2.5 (3) MG/3ML IN SOLN: 3.0000 mL | Freq: Four times a day (QID) | RESPIRATORY_TRACT | Status: DC | PRN

## 2021-06-21 MED ORDER — IPRATROPIUM-ALBUTEROL 0.5-2.5 (3) MG/3ML IN SOLN
3.0000 mL | Freq: Four times a day (QID) | RESPIRATORY_TRACT | Status: DC
  Filled 2021-06-21: qty 3

## 2021-06-21 MED ORDER — SODIUM CHLORIDE 0.9 % IV SOLN
1.0000 g | Freq: Once | INTRAVENOUS | Status: AC
  Administered 2021-06-21: 1 g via INTRAVENOUS
  Filled 2021-06-21: qty 10

## 2021-06-21 MED ORDER — DOCUSATE SODIUM 100 MG PO CAPS: 100.0000 mg | ORAL_CAPSULE | Freq: Two times a day (BID) | ORAL | Status: DC | PRN

## 2021-06-21 MED ORDER — SODIUM CHLORIDE 0.9 % IV SOLN
500.0000 mg | Freq: Once | INTRAVENOUS | Status: AC
  Administered 2021-06-21: 500 mg via INTRAVENOUS
  Filled 2021-06-21: qty 5

## 2021-06-21 MED ORDER — BUDESONIDE 0.25 MG/2ML IN SUSP: 0.2500 mg | Freq: Two times a day (BID) | RESPIRATORY_TRACT | Status: DC

## 2021-06-21 MED ORDER — FAMOTIDINE IN NACL 20-0.9 MG/50ML-% IV SOLN
20.0000 mg | Freq: Two times a day (BID) | INTRAVENOUS | Status: DC
  Administered 2021-06-21: 20 mg via INTRAVENOUS
  Filled 2021-06-21 (×3): qty 50

## 2021-06-21 MED ORDER — MORPHINE 100MG IN NS 100ML (1MG/ML) PREMIX INFUSION
1.0000 mg/h | INTRAVENOUS | Status: DC
  Administered 2021-06-21 – 2021-06-22 (×2): 10 mg/h via INTRAVENOUS
  Administered 2021-06-22: 8 mg/h via INTRAVENOUS
  Filled 2021-06-21 (×3): qty 100

## 2021-06-21 MED ORDER — SODIUM CHLORIDE 0.9 % IV SOLN
1.0000 g | Freq: Every day | INTRAVENOUS | Status: DC
  Filled 2021-06-21: qty 10

## 2021-06-21 MED ORDER — LACTATED RINGERS IV SOLN: INTRAVENOUS | Status: DC

## 2021-06-21 MED ORDER — LORAZEPAM 2 MG/ML IJ SOLN
2.0000 mg | INTRAMUSCULAR | Status: DC | PRN
  Administered 2021-06-22: 2 mg via INTRAVENOUS
  Filled 2021-06-21: qty 1

## 2021-06-21 MED ORDER — IPRATROPIUM-ALBUTEROL 0.5-2.5 (3) MG/3ML IN SOLN
RESPIRATORY_TRACT | Status: AC
  Administered 2021-06-21: 3 mL via RESPIRATORY_TRACT
  Filled 2021-06-21: qty 9

## 2021-06-21 NOTE — ED Provider Notes (Signed)
[facility] Provider Note    Event Date/Time   First MD Initiated Contact with Patient 07/17/2021 0404     (approximate)   History   Respiratory Distress   HPI  Kevin Nelson is a 71 y.o. male with a history of end-stage COPD on 6 L nasal cannula, throat cancer, prostate cancer, smoking who presents for evaluation of respiratory distress.  Patient has had progressively worsening shortness of breath over the last 2 days.  No fevers.  This evening family called for severe respiratory distress.  Patient had tried to breathing treatments without significant relief.  Per EMS patient was satting 72% on his 5 L and had a end-tidal of 62.  Patient was given Solu-Medrol and transported on a nonrebreather     Past Medical History:  Diagnosis Date   Anxiety    Basal cell carcinoma    Cigarette smoker    Dental decay    Dysphagia    Dyspnea    End stage COPD ([place])    end stage copd, emphysema   Erectile dysfunction    Headache    every morning   Hepatitis C    treated for 8 weeks this past year with Mayvret   History of nonmelanoma skin cancer    Hoarseness, chronic    Myocardial infarction ([place]) march/april 2020   mild heart attack   Prostate cancer ([place])    Respiratory failure, acute ([place])    was inpt for 5 days. thought he was going to die.   Sleep apnea    Squamous cell skin cancer    Throat cancer ([facility])    Use of leuprolide acetate (Lupron)    treated for prostate cancer.    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    Past Surgical History:  Procedure Laterality Date   FACIAL LACERATION REPAIR     HERNIA REPAIR Left 1992   inguinal   IR GASTROSTOMY TUBE MOD SED  04/26/2019   MICROLARYNGOSCOPY N/A 01/17/2019   Procedure: MICROLARYNGOSCOPY WITH BX;  Surgeon: Clyde Canterbury, MD;  Location: [facility] ORS;  Service: ENT;  Laterality: N/A;   PORTA CATH INSERTION N/A 03/15/2019   Procedure: PORTA CATH INSERTION;  Surgeon: Algernon Huxley, MD;   Location: [facility];  Service: Cardiovascular;  Laterality: N/A;   PORTA CATH REMOVAL N/A 10/08/2019   Procedure: PORTA CATH REMOVAL;  Surgeon: Algernon Huxley, MD;  Location: [facility];  Service: Cardiovascular;  Laterality: N/A;   PROSTATE BIOPSY     SKIN CANCER EXCISION     TRIGGER FINGER RELEASE Left 10/2018     Physical Exam   Triage Vital Signs: ED Triage Vitals  Enc Vitals Group     BP 07/16/2021 0403 (!) 135/102     Pulse Rate 06/26/2021 0359 (!) 143     Resp 06/20/2021 0359 (!) 36     Temp --      Temp Source 06/29/2021 0400 Axillary     SpO2 06/29/2021 0359 (!) 71 %     Weight 06/28/2021 0401 99 lb 3.3 oz (45 kg)     Height --      Head Circumference --      Peak Flow --      Pain Score --      Pain Loc --      Pain Edu? --      Excl. in [place]? --     Most recent vital signs:  Vitals:   06/20/2021 0430 07/01/2021 0445  BP: (!) 150/65   Pulse: (!) 139 (!) 136  Resp: (!) 24 (!) 24  SpO2: 99% 100%     Constitutional: Severe respiratory distress, cachectic HEENT:      Head: Normocephalic and atraumatic.         Eyes: Conjunctivae are normal. Sclera is non-icteric.       Mouth/Throat: Mucous membranes are moist.       Neck: Supple with no signs of meningismus. Cardiovascular: Tachycardic with regular rhythm Respiratory: Severe respiratory distress, tripoding, tachypneic, severely diminished air movement bilaterally Gastrointestinal: Soft, non tender, and non distended with positive bowel sounds. No rebound or guarding. Genitourinary: No CVA tenderness. Musculoskeletal:  No edema, cyanosis, or erythema of extremities. Neurologic: Normal speech and language. Face is symmetric. Moving all extremities. No gross focal neurologic deficits are appreciated. Skin: Skin is warm, dry and intact. No rash noted. Psychiatric: Mood and affect are normal. Speech and behavior are normal.  ED Results / Procedures / Treatments   Labs (all labs ordered are listed, but only  abnormal results are displayed) Labs Reviewed  COMPREHENSIVE METABOLIC PANEL - Abnormal; Notable for the following components:      Result Value   Chloride 96 (*)    CO2 40 (*)    Glucose, Bld 146 (*)    Creatinine, Ser 0.52 (*)    Calcium 7.8 (*)    Total Protein 6.1 (*)    Albumin 3.4 (*)    Alkaline Phosphatase 36 (*)    Anion gap 4 (*)    All other components within normal limits  BLOOD GAS, VENOUS - Abnormal; Notable for the following components:   pH, Ven 7.13 (*)    pCO2, Ven >123 (*)    Bicarbonate 44.2 (*)    Acid-Base Excess 9.8 (*)    All other components within normal limits  CBC WITH DIFFERENTIAL/PLATELET - Abnormal; Notable for the following components:   WBC 13.3 (*)    RBC 3.83 (*)    Hemoglobin 9.9 (*)    HCT 34.6 (*)    MCH 25.8 (*)    MCHC 28.6 (*)    Neutro Abs 12.0 (*)    All other components within normal limits  BRAIN NATRIURETIC PEPTIDE - Abnormal; Notable for the following components:   B Natriuretic Peptide 107.4 (*)    All other components within normal limits  TROPONIN I (HIGH SENSITIVITY) - Abnormal; Notable for the following components:   Troponin I (High Sensitivity) 26 (*)    All other components within normal limits  RESP PANEL BY RT-PCR (FLU A&B, COVID) ARPGX2  CULTURE, BLOOD (ROUTINE X 2)  CULTURE, BLOOD (ROUTINE X 2)  PROCALCITONIN  LACTIC ACID, PLASMA  LACTIC ACID, PLASMA     EKG  ED ECG REPORT I, Rudene Re, the attending physician, personally viewed and interpreted this ECG.  Sinus tachycardia with a rate of 143, no ST elevations  RADIOLOGY I, Rudene Re, attending MD, have personally viewed and interpreted the images obtained during this visit as below:  Chest x-ray with bilateral infiltrates   ___________________________________________________ Interpretation by Radiologist:  DG Chest 1 View  Result Date: 07/13/2021 CLINICAL DATA:  71 year old male respiratory distress. EXAM: CHEST  1 VIEW COMPARISON:   Portable chest 08/30/2020 and earlier. FINDINGS: Portable AP upright view at 0412 hours. Chronic pulmonary hyperinflation, centrilobular emphysema by CT in 2021. Widespread, asymmetric bilateral coarse and confluent i acute pulmonary opacity. Left upper lobe and right perihilar, infrahilar  lung most affected. No superimposed pneumothorax. No definite pleural effusion. Underlying cardiac and mediastinal contours remain within normal limits. Visualized tracheal air column is within normal limits. No acute osseous abnormality identified. Partially visible gas distended stomach. IMPRESSION: Emphysema ([id number]) with extensive acute airspace opacity in both lungs compatible with Severe Bilateral Pneumonia. No pneumothorax or definite pleural effusion. Electronically Signed   By: Genevie Ann M.D.   On: 06/19/2021 04:29       PROCEDURES:  Critical Care performed: Yes, see critical care procedure note(s)  .Critical Care Performed by: Rudene Re, MD Authorized by: Rudene Re, MD   Critical care provider statement:    Critical care time (minutes):  40   Critical care time was exclusive of:  Separately billable procedures and treating other patients   Critical care was necessary to treat or prevent imminent or life-threatening deterioration of the following conditions:  Cardiac failure, circulatory failure, CNS failure or compromise, respiratory failure and shock   Critical care was time spent personally by me on the following activities:  Development of treatment plan with patient or surrogate, discussions with consultants, evaluation of patient's response to treatment, examination of patient, ordering and review of laboratory studies, ordering and review of radiographic studies, ordering and performing treatments and interventions, pulse oximetry, re-evaluation of patient's condition and review of old charts   I assumed direction of critical care for this patient from another provider in my  specialty: no     Care discussed with: admitting provider      IMPRESSION / MDM / [place] / ED COURSE  I reviewed the triage vital signs and the nursing notes.   71 y.o. male with a history of end-stage COPD on 6 L nasal cannula, throat cancer, prostate cancer, smoking who presents for evaluation of respiratory distress.  Patient arrives in severe respiratory distress, hypoxic, tripoding, severely diminished air movement bilaterally.  Patient is cachectic.  Patient was started on BiPAP immediately.  Patient is DNR/DNI.  Had received Solu-Medrol per EMS. Duoneb x 3.  Chest x-ray showing bilateral pneumonia.  With the tachycardia and tachypnea patient meets sepsis criteria.  Sepsis protocol initiated.  Patient has a history of CHF the EF of 50% therefore we will give fluids gently especially since patient is not hypotensive.  No recent admissions therefore will cover with Rocephin and azithromycin.  CBC showed a leukocytosis with white count of 13.3.  Stable anemia with hemoglobin of 9.9.  VBG showing pH of 7.13 with a PCO2 greater than 123.  Initial troponin of 26 concerning for demand ischemia.  BNP slightly elevated at 107.  Patient's girlfriend is at bedside and was updated of patient's serious condition.    MEDICATIONS GIVEN IN ED: Medications  cefTRIAXone (ROCEPHIN) 1 g in sodium chloride 0.9 % 100 mL IVPB (1 g Intravenous New Bag/Given 07/13/2021 0521)  azithromycin (ZITHROMAX) 500 mg in sodium chloride 0.9 % 250 mL IVPB (has no administration in time range)  lactated ringers bolus 500 mL (has no administration in time range)  lactated ringers infusion (has no administration in time range)  ipratropium-albuterol (DUONEB) 0.5-2.5 (3) MG/3ML nebulizer solution 3 mL (3 mLs Nebulization Given 07/12/2021 0405)  ipratropium-albuterol (DUONEB) 0.5-2.5 (3) MG/3ML nebulizer solution 3 mL (3 mLs Nebulization Given 07/07/2021 0407)  ipratropium-albuterol (DUONEB) 0.5-2.5 (3) MG/3ML nebulizer solution 3  mL (3 mLs Nebulization Given 07/06/2021 0407)   Consults: Hospitalist   EMR reviewed including last visit with pulmonology from 3 weeks ago for severe COPD  FINAL CLINICAL IMPRESSION(S) / ED DIAGNOSES   Final diagnoses:  Acute on chronic respiratory failure with hypoxia and hypercapnia ([place])  Community acquired pneumonia, unspecified laterality     Rx / DC Orders   ED Discharge Orders     None        Note:  This document was prepared using Dragon voice recognition software and may include unintentional dictation errors.   Please note:  Patient was evaluated in Emergency Department today for the symptoms described in the history of present illness. Patient was evaluated in the context of the global COVID-19 pandemic, which necessitated consideration that the patient might be at risk for infection with the SARS-CoV-2 virus that causes COVID-19. Institutional protocols and algorithms that pertain to the evaluation of patients at risk for COVID-19 are in a state of rapid change based on information released by regulatory bodies including the CDC and federal and state organizations. These policies and algorithms were followed during the patient's care in the ED.  Some ED evaluations and interventions may be delayed as a result of limited staffing during the pandemic.       Alfred Levins, [place], MD 07/17/2021 [phone]

## 2021-06-21 NOTE — Progress Notes (Signed)
PHARMACIST - PHYSICIAN COMMUNICATION  CONCERNING:  Enoxaparin (Lovenox) for DVT Prophylaxis    RECOMMENDATION: Patient was prescribed enoxaprin '40mg'$  q24 hours for VTE prophylaxis.   Filed Weights   07/17/2021 0401  Weight: 45 kg (99 lb 3.3 oz)    Body mass index is 15.54 kg/m.  Estimated Creatinine Clearance: 56.3 mL/min (A) (by C-G formula based on SCr of 0.52 mg/dL (L)).   Patient is candidate for enoxaparin '30mg'$  every 24 hours based on CrCl <61m/min or Weight = 45kg  DESCRIPTION: Pharmacy has [facility]arin dose [facility]  Patient is now receiving enoxaparin 30 mg every 24 hours   NRenda Rolls PharmD, [facility]6/04/2021 6:05 AM

## 2021-06-21 NOTE — ED Triage Notes (Signed)
Pt arrives in resp distress, pt with yellow DNR PAPER PRESENT. Per ems pt was 72% on 5lpm home oxygen. Pt has end stage copd per ems. Ems gave solumedrol '125mg'$  iv, fsbs 173. Pt is alert, not verbal at this time.

## 2021-06-21 NOTE — Sepsis Progress Note (Signed)
[facility] following Code Sepsis. 

## 2021-06-21 NOTE — Progress Notes (Signed)
GOALS OF CARE FAMILY CONFERENCE   Current clinical status, hospital findings and medical plan was reviewed with family.   Updated and notified of patients ongoing immediate critical medical problems.   Patient remains unresponsive acutely comatose with severe hypoxemic hypercapnic respiratory failure in the context of multifocal pneumonia, advanced COPD, alcoholism, opioid dependence and tobacco abuse with throat cancer post SBRT.    Patient is unable to breathe independently, unable to protect airway and unable to mobilize secretions.    Explained to family course of therapy and the modalities   Patient with Progressive multiorgan failure with high probability of a very minimal chance of meaningful recovery despite aggressive and optimal medical therapy.   Family is appreciative of care and relate understanding that patient is severely critically ill with anticipation of passing away during this hospitalization.   They have consented and agreed to DNR/DNI  Code status   Maverick Dieudonne he was brother of patient and POA requests to initiate comfort care measures at this time.  Family are satisfied with Plan of action and management. All questions answered  Additional Critical Care time 35 mins    Ottie Glazier, M.D.  Pulmonary & [place]

## 2021-06-21 NOTE — Progress Notes (Signed)
Pt removed from BiPAP and placed on HFNC 11 L

## 2021-06-21 NOTE — Progress Notes (Signed)
Chaplain responded to request for family support following transition to comfort care.  Pt's significant other, Patti, was bedside, holding pt's hand.  Pt friend, Barbaraann Rondo, was present.  GF appeared sad, expressed devotion to pt, and only worried about pt's pain.  Chaplain provided supportive listening, offered hospitality and gave orientation to services.  GF did state that when pt had been in hospice services earlier, he refused offer for a chaplain visit.  However, GF and friend expressed appreciation for concern.  Please contact if further support is needed.   Minus [place], [place] Pager: [phone]     07/15/2021 1041  Clinical Encounter Type  Visited With Patient and family together  Visit Type Initial (Transition to comfort care)  Referral From Nurse  Consult/Referral To Chaplain  Spiritual Encounters  Spiritual Needs Emotional;Grief support  Stress Factors  Patient Stress Factors Health changes  Family Stress Factors Family relationships

## 2021-06-21 NOTE — Progress Notes (Signed)
Pharmacy Antibiotic Note  Kevin Nelson is a 71 y.o. male admitted on 06/19/2021 with CAP.  Pharmacy has been consulted for Levaquin dosing.  Plan: Levaquin 500 mg q24h per indication & renal fxn.  Pharmacy will continue to follow and will adjust abx dosing whenever warranted.  Height: '5\' 7"'$  (170.2 cm) Weight: 45 kg (99 lb 3.3 oz) IBW/kg (Calculated) : 66.1  Temp (24hrs), Avg:98.3 F (36.8 C), Min:98.3 F (36.8 C), Max:98.3 F (36.8 C)  Recent Labs  Lab 06/30/2021 0407 07/11/2021 0458  WBC 13.3*  --   CREATININE 0.52*  --   LATICACIDVEN  --  1.3    Estimated Creatinine Clearance: 56.3 mL/min (A) (by C-G formula based on SCr of 0.52 mg/dL (L)).    No Known Allergies  Antimicrobials this admission: 6/04 Azithromycin >> x 1 6/04 Ceftriaxone >> x 1 6/05 Levaquin >>  Microbiology results: 6/04 BCx: Pending  Thank you for allowing pharmacy to be a part of this patient's care.  Renda Rolls, PharmD, MBA 06/20/2021 6:18 AM

## 2021-06-21 NOTE — H&P (Signed)
NAME:  Kevin Nelson, MRN:  [id number], DOB:  10/27/1950, LOS: 0 ADMISSION DATE:  07/11/2021, CONSULTATION DATE: 07/02/2021 REFERRING MD: Dr. Alfred Levins, CHIEF COMPLAINT: Shortness of Breath   History of Present Illness:  This is a 71 yo male with a PMH of Current Smoker, OSA, Throat Cancer, Prostate Cancer, and End Stage COPD (on chronic O2 '@6L'$ ).  He presented to [facility] ER on 06/4 via EMS with shortness of breath and hypoxia, O2 sats were 72% on his home O2 '@5L'$ .  The pt attempted to treat his respiratory failure with breathing treatments, however symptoms persisted and family notified EMS.  Per ER notes EMS administered 125 mg of iv solumedrol and he was placed on NRB.    ED Course  Upon arrival to the ER pt remained in severe respiratory distress with hypoxia and sitting in a tripod position.  CXR concerning for bilateral pneumonia.  Pt placed on Bipap and received duonebs x3. Lab results revealed chloride 96, CO2 40, glucose 146, wbc 13.3, hgb 9.9, and vbg pH 7.13/pCO2 >123/acid-base excess 9.8/bicarb 44.2.  Due to pts tachycardia and tachypnea sepsis protocol initiated and pt received 500 ml LR bolus, cefepime, and azithromycin.  Code status confirmed by ER provider and pt is DNR/DNI.  PCCM team contacted for admission.    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  Pt lethargic and weak but able to follow some commands and does not appear in respiratory distress   Objective   Blood pressure (!) 103/55, pulse (!) 120, temperature 98.3 F (36.8 C), temperature source Axillary, resp. rate (!) 21, height '5\' 7"'$  (1.702 m), weight 45 kg, SpO2 97 %.        Intake/Output Summary (Last 24 hours) at 07/15/2021 0655 Last data filed at 07/01/2021 0947 Gross per 24 hour  Intake 1184.23 ml  Output --  Net 1184.23 ml   Filed Weights   06/26/2021 0401  Weight: 45 kg    Examination: General: Acute on chronically ill cachetic male, NAD on Bipap  HENT: Supple, mild JVD present  Lungs: Diminished throughout, even, non labored  Cardiovascular: Sinus tachycardia, s1s2, no r/g, 2+ radial/2+ distal pulses, no edema  Abdomen: +BS x4, concave, non distended, non tender  Extremities: Moves all extremities  Neuro: Lethargic and disoriented, very weak following commands  GU: Condom cath in place   Resolved Hospital Problem list     Assessment & Plan:  Acute on chronic hypercapnic hypoxic respiratory failure secondary to bilateral pneumonia and end stage COPD  Acute encephalopathy secondary to severe hypercapnia  Hx: OSA and Current everyday smoker  - Continue Bipap for now for hypercapnia and/hypoxia  - Bipap qhs  - Continue iv and nebulized steroids - Continue scheduled and bronchodilator therapy  - Trend WBC and monitor fever curve  - Trend PCT  - Follow cultures, legionella, and strep  pneumoniae urinary antigen results - Continue levaquin for now  - Will need smoking cessation counseling once mentation improves - Avoid sedating medications when able   Anemia without obvious acute blood loss - Trend CBC  - Monitor for s/sx of bleeding and transfuse for hgb <7  Best Practice (right click and "Reselect all SmartList Selections" daily)   Diet/type: NPO DVT prophylaxis: LMWH GI prophylaxis: H2B Lines: N/A Foley:  N/A Code Status:  DNR Last date  of multidisciplinary goals of care discussion [N/A]  Pt at [facility] for CARDIAC/RESPIRATORY ARREST and SUDDEN DEATH.  Pt confirmed DNR/DNI.  ICU attending discussing pt condition with pts girlfriend at bedside.  Labs   CBC: Recent Labs  Lab 06/25/2021 0407  WBC 13.3*  NEUTROABS 12.0*  HGB 9.9*  HCT 34.6*  MCV 90.3  PLT 373    Basic Metabolic Panel: Recent Labs  Lab 07/09/2021 0407  NA 140  K 4.6  CL 96*  CO2 40*  GLUCOSE 146*  BUN 16  CREATININE 0.52*  CALCIUM 7.8*   GFR: Estimated Creatinine Clearance: 56.3 mL/min (A) (by C-G formula based on SCr of 0.52 mg/dL (L)). Recent Labs  Lab 07/11/2021 0407 07/07/2021 0458  PROCALCITON  --  1.50  WBC 13.3*  --   LATICACIDVEN  --  1.3    Liver Function Tests: Recent Labs  Lab 07/16/2021 0407  AST 21  ALT 10  ALKPHOS 36*  BILITOT 0.8  PROT 6.1*  ALBUMIN 3.4*   No results for input(s): LIPASE, AMYLASE in the last 168 hours. No results for input(s): AMMONIA in the last 168 hours.  ABG    Component Value Date/Time   HCO3 33.2 (H) 07/12/2021 0602   O2SAT 98.5 07/10/2021 0602     Coagulation Profile: No results for input(s): INR, PROTIME in the last 168 hours.  Cardiac Enzymes: No results for input(s): CKTOTAL, CKMB, CKMBINDEX, TROPONINI in the last 168 hours.  HbA1C: HB A1C (BAYER DCA - WAIVED)  Date/Time Value Ref Range Status  11/29/2019 09:53 AM 5.0 <7.0 % Final    Comment:                                          Diabetic Adult            <7.0                                       Healthy Adult        4.3 - 5.7                                                           (DCCT/NGSP) American Diabetes Association's Summary of Glycemic Recommendations for Adults with Diabetes: Hemoglobin A1c <7.0%. More stringent glycemic goals (A1c <6.0%) may further reduce complications at the cost of increased risk of hypoglycemia.    Hgb A1c MFr Bld  Date/Time Value Ref Range Status  01/28/2020 11:09 AM 5.1 4.8 - 5.6 %  Final    Comment:             Prediabetes: 5.7 - 6.4  Diabetes: >6.4          Glycemic control for adults with diabetes: <7.0     CBG: No results for input(s): GLUCAP in the last 168 hours.  Review of Systems:   Unable to assess pt lethargic on Bipap   Past Medical History:  He,  has a past medical history of Anxiety, Basal cell carcinoma, Cigarette smoker, Dental decay, Dysphagia, Dyspnea, End stage COPD ([place]), Erectile dysfunction, Headache, Hepatitis C, History of nonmelanoma skin cancer, Hoarseness, chronic, Myocardial infarction ([place]) (march/april 2020), Prostate cancer ([place]), Respiratory failure, acute ([place]), Sleep apnea, Squamous cell skin cancer, Throat cancer ([place]), Use of leuprolide acetate (Lupron), and Vocal cord mass (12/2018).   Surgical History:   Past Surgical History:  Procedure Laterality Date   FACIAL LACERATION REPAIR     HERNIA REPAIR Left 1992   inguinal   IR GASTROSTOMY TUBE MOD SED  04/26/2019   MICROLARYNGOSCOPY N/A 01/17/2019   Procedure: MICROLARYNGOSCOPY WITH BX;  Surgeon: Clyde Canterbury, MD;  Location: [facility] ORS;  Service: ENT;  Laterality: N/A;   PORTA CATH INSERTION N/A 03/15/2019   Procedure: PORTA CATH INSERTION;  Surgeon: Algernon Huxley, MD;  Location: [place] [facility];  Service: Cardiovascular;  Laterality: N/A;   PORTA CATH REMOVAL N/A 10/08/2019   Procedure: PORTA CATH REMOVAL;  Surgeon: Algernon Huxley, MD;  Location: [place] [facility];  Service: Cardiovascular;  Laterality: N/A;   PROSTATE BIOPSY     SKIN CANCER EXCISION     TRIGGER FINGER RELEASE Left 10/2018     Social History:   reports that he has been smoking cigarettes. He has a 26.00 pack-year smoking history. He has never used smokeless tobacco. He reports that he does not currently use alcohol after a past usage of about 3.0 standard drinks per week. He reports that he does not use drugs.   Family History:  His family history includes Cancer in his sister; Cancer - Cervical  in his mother; Heart attack in his brother and maternal grandfather.   Allergies No Known Allergies   Home Medications  Prior to Admission medications   Medication Sig Start Date End Date Taking? Authorizing Provider  albuterol (PROVENTIL) (2.5 MG/3ML) 0.083% nebulizer solution Take 3 mLs (2.5 mg total) by nebulization every 6 (six) hours as needed for wheezing or shortness of breath. 12/05/19   Tyler Pita, MD  albuterol (VENTOLIN HFA) 108 (90 Base) MCG/ACT inhaler INHALE 2 PUFFS INTO THE LUNGS EVERY 6 HOURS AS NEEDED FOR WHEEZING OR SHORTNESS OF BREATH 02/16/21   Tyler Pita, MD  amoxicillin-clavulanate (AUGMENTIN) 400-57 MG/5ML suspension Take 5 mLs (400 mg total) by mouth 2 (two) times daily. 02/12/21   Cannady, [name] T, NP  clonazePAM (KLONOPIN) 1 MG tablet TAKE 1 TABLET(1 MG) BY MOUTH TWICE DAILY AS NEEDED FOR ANXIETY 08/28/20   Cannady, [name] T, NP  docusate sodium (COLACE) 100 MG capsule Take 100-200 mg by mouth daily as needed for mild constipation or moderate constipation.    [provider]  fluticasone (FLONASE) 50 MCG/ACT nasal spray SHAKE LIQUID AND USE 1 SPRAY IN EACH NOSTRIL TWICE DAILY 07/08/20   Tyler Pita, MD  gabapentin (NEURONTIN) 100 MG capsule Take 1 capsule (100 mg total) by mouth every morning. 09/19/20   Cannady, Henrine Screws T, NP  gabapentin (NEURONTIN) 600 MG tablet Take 0.5 tablets (300 mg total) by mouth at bedtime. 09/15/20   Cannady, Henrine Screws T, NP  guaiFENesin-dextromethorphan (ROBITUSSIN DM) 100-10 MG/5ML syrup Take 10 mLs by  mouth every 4 (four) hours as needed for cough (chest congestion). 09/02/20   Fritzi Mandes, MD  ipratropium-albuterol (DUONEB) 0.5-2.5 (3) MG/3ML SOLN USE 3 ML VIA NEBULIZER EVERY 6 HOURS AS NEEDED 02/27/20   Cannady, [name] T, NP  LORazepam (ATIVAN) 1 MG tablet Take 1 tablet (1 mg total) by mouth daily as needed (this is for alcohol withdrawal use only). DO NOT TAKE THIS FOR MOOD -- USE KLONOPIN FOR ANXIETY.  THIS IS FOR  ALCOHOL WITHDRAWAL SYMPTOMS ONLY. Patient not taking: Reported on 04/27/2021 11/06/20   Marnee Guarneri T, NP  lubiprostone (AMITIZA) 8 MCG capsule Take 1 capsule (8 mcg total) by mouth daily with breakfast. Patient not taking: Reported on 04/24/2021 07/31/20   Marnee Guarneri T, NP  morphine (ROXANOL) 20 MG/ML concentrated solution Take 0.25 mL by mouth every 2 hours as needed for moderate to severe pain (5-10 on pain scale).  Hospice patient. 09/26/20   Marnee Guarneri T, NP  Multiple Vitamin (MULTIVITAMIN WITH MINERALS) TABS tablet Take 1 tablet by mouth daily. 09/03/20   Fritzi Mandes, MD  nicotine (NICODERM CQ - DOSED IN MG/24 HOURS) 21 mg/24hr patch Place 1 patch (21 mg total) onto the skin daily. 09/03/20   Fritzi Mandes, MD  oxyCODONE-acetaminophen (PERCOCET) 10-325 MG tablet Take 1 tablet by mouth every 12 (twelve) hours as needed for pain. Patient not taking: Reported on 04/24/2021 02/12/21   Marnee Guarneri T, NP  predniSONE (DELTASONE) 10 MG tablet TAKE 1 TABLET (10 MG TOTAL) BY MOUTH DAILY WITH BREAKFAST. 04/21/21   Cannady, [name] T, NP  sodium chloride (OCEAN) 0.65 % SOLN nasal spray Place 1 spray into both nostrils as needed for congestion. 09/02/20   Fritzi Mandes, MD  tamsulosin (FLOMAX) 0.4 MG CAPS capsule Take 1 capsule (0.4 mg total) by mouth daily. 01/23/21   [name], NP     Critical care time: 50 minutes     Donell Beers, [place] Pager [phone] (please enter 7 digits) PCCM Consult Pager [phone] (please enter 7 digits)

## 2021-06-21 NOTE — Progress Notes (Signed)
Patient daughter Kevin Nelson will be arriving tonight , Please let her in the door.

## 2021-06-21 NOTE — Progress Notes (Signed)
CODE SEPSIS - PHARMACY COMMUNICATION  **Broad Spectrum Antibiotics should be administered within 1 hour of Sepsis diagnosis**  Time Code Sepsis Called/Page Received: 9390  Antibiotics Ordered: Azithromycin & Ceftriaxone  Time of 1st antibiotic administration: [place], PharmD, [facility] 06/20/2021 5:27 AM

## 2021-06-21 NOTE — Consult Note (Signed)
[place] for Electrolyte Monitoring and Replacement   Recent Labs: Potassium (mmol/L)  Date Value  06/20/2021 4.6   Magnesium (mg/dL)  Date Value  08/31/2020 1.9   Calcium (mg/dL)  Date Value  06/28/2021 7.8 (L)   Albumin (g/dL)  Date Value  07/03/2021 3.4 (L)  11/29/2019 4.1   Phosphorus (mg/dL)  Date Value  08/31/2020 2.4 (L)   Sodium (mmol/L)  Date Value  06/19/2021 140  01/28/2020 141   Assessment: Patient is a 71 y/o M with medical history including end stage COPD on 6L [place], throat cancer, prostate cancer, tobacco use disorder who presented to the ED 6/4 with respiratory distress. Patient disposition is the ICU. He is currently requiring BiPAP. Pharmacy consulted to assist with electrolyte monitoring and replacement as indicated.   Diet: NPO  Goal of Therapy:  Electrolytes within normal limits  Plan:  --No electrolyte replacement indicated at this time --Follow-up electrolytes with AM labs tomorrow  Benita Gutter 07/09/2021 7:49 AM

## 2021-06-21 NOTE — ED Notes (Signed)
Pt comes in being bagged by EMS. Pt's girlfriend said that pt was unable to "get enough air" starting at [place]. Pt came in with labored, tachypneic breathing. Pt's girlfriend stays pt came off hospice 1/23 due to need to get skin cancer areas removed. Pt was not taken back on hospice for his end stage COPD due to hospice feeling "he was doing so well". Pt has not had any contact with ill people per girlfriend and was at baseline until 0030 06/21/21. Pt placed on BiPAP upon arrival.

## 2021-06-21 NOTE — Progress Notes (Signed)
PHARMACY NOTE:  ANTIMICROBIAL RENAL DOSAGE ADJUSTMENT  Current antimicrobial regimen includes a mismatch between antimicrobial dosage and estimated renal function.  As per policy approved by the Pharmacy & Therapeutics and Medical Executive Committees, the antimicrobial dosage will be adjusted accordingly.  Current antimicrobial dosage:  Levofloxacin 500 mg IV q24h  Indication: Bilateral pneumonia  Renal Function:  Estimated Creatinine Clearance: 56.3 mL/min (A) (by C-G formula based on SCr of 0.52 mg/dL (L)).    Antimicrobial dosage has been changed to:  Levofloxacin 750 mg IV q24h  Thank you for allowing pharmacy to be a part of this patient's care.  Benita Gutter, [facility] 07/17/2021 7:53 AM

## 2021-06-21 NOTE — Progress Notes (Signed)
CRITICAL CARE PROGRESS NOTE -71 year old patient with severe comorbid medical conditions including advanced COPD chronic hypoxemia with FEV1 less than 15% as of May 2023, laryngeal cancer status post multiple treatments with SBRT, active tobacco abuse despite using 5 L of oxygen at rest, severe dyspnea with mMRC 4, alcoholism, chronic opioid dependence on morphine daily came in with severe hypoxemia and hypercapnia patient is DNR/DNI which she made with advanced directive himself.  I met with his significant other Olena Leatherwood who shares patient has been suffering with constant pain despite morphine and severe dyspnea.  Patient with ABG showing severe hypercapnic and hypoxemic respiratory failure despite maximal settings on BiPAP  I discussed his condition with Marton Redwood brother of patient who is POA and current active RN  Patient is at high risk of death and severely critically ill currently acutely comatose severe hypercapnic and hypoxemic respiratory failure with multifocal pneumonia.   Ottie Glazier, M.D.  Pulmonary & [place]

## 2021-06-21 NOTE — Plan of Care (Signed)

## 2021-06-22 DIAGNOSIS — J9621 Acute and chronic respiratory failure with hypoxia: Secondary | ICD-10-CM | POA: Diagnosis not present

## 2021-06-22 DIAGNOSIS — J9622 Acute and chronic respiratory failure with hypercapnia: Secondary | ICD-10-CM | POA: Diagnosis not present

## 2021-06-22 MED ORDER — ONDANSETRON HCL 4 MG/2ML IJ SOLN: 4.0000 mg | Freq: Four times a day (QID) | INTRAMUSCULAR | Status: DC | PRN

## 2021-06-22 MED ORDER — LORAZEPAM 2 MG/ML IJ SOLN: 1.0000 mg | INTRAMUSCULAR | Status: DC | PRN

## 2021-06-22 MED ORDER — GLYCOPYRROLATE 0.2 MG/ML IJ SOLN: 0.2000 mg | INTRAMUSCULAR | Status: DC | PRN

## 2021-06-22 MED ORDER — ACETAMINOPHEN 650 MG RE SUPP: 650.0000 mg | Freq: Four times a day (QID) | RECTAL | Status: DC | PRN

## 2021-06-22 MED ORDER — HALOPERIDOL 0.5 MG PO TABS: 0.5000 mg | ORAL_TABLET | ORAL | Status: DC | PRN

## 2021-06-22 MED ORDER — DIPHENHYDRAMINE HCL 50 MG/ML IJ SOLN: 12.5000 mg | INTRAMUSCULAR | Status: DC | PRN

## 2021-06-22 MED ORDER — ACETAMINOPHEN 325 MG PO TABS: 650.0000 mg | ORAL_TABLET | Freq: Four times a day (QID) | ORAL | Status: DC | PRN

## 2021-06-22 MED ORDER — HALOPERIDOL LACTATE 5 MG/ML IJ SOLN: 0.5000 mg | INTRAMUSCULAR | Status: DC | PRN

## 2021-06-22 MED ORDER — GLYCOPYRROLATE 1 MG PO TABS: 1.0000 mg | ORAL_TABLET | ORAL | Status: DC | PRN

## 2021-06-22 MED ORDER — HALOPERIDOL LACTATE 2 MG/ML PO CONC: 0.5000 mg | ORAL | Status: DC | PRN

## 2021-06-22 MED ORDER — ONDANSETRON 4 MG PO TBDP: 4.0000 mg | ORAL_TABLET | Freq: Four times a day (QID) | ORAL | Status: DC | PRN

## 2021-06-22 NOTE — Progress Notes (Signed)
[facility] PROGRESS NOTE  Patient: Kevin Nelson EHM:[id number]   PCP: Alvester Chou, NP DOB: 08-24-1950   DOA: 07/07/2021   DOS: 07/02/2021    Subjective: Appears comfortable. Later in the day became agitated.  Objective:  Vitals:   07/17/2021 2016 06/26/2021 0754  BP: (!) 83/48 (!) 60/36  Pulse: 69 63  Resp: 16 (!) 8  Temp:    SpO2: (!) 88% (!) 86%    Clear to auscultation for now.  No secretions no gurgling. S1-S2 present. Bowel sound present.  Minimally responsive.  Assessment and plan: Acute on chronic hypoxic and hypercarbic respiratory failure. Bilateral pneumonia. End-stage COPD. Acute encephalopathy. OSA. Initially admitted to the ICU. Transferred to the floor after discussion with the family with regards to goals of care. Currently comfort care on IV morphine drip. Anticipating in-hospital death in next 24 to 48 hours.  Author: Berle Mull, MD [facility] 07/02/2021 5:33 PM   If 7PM-7AM, please contact night-coverage at [email or web address]

## 2021-06-22 NOTE — Plan of Care (Signed)

## 2021-06-22 NOTE — Progress Notes (Signed)
Morphine drip increased to '10mg'$ /hr at 1445 due to increased agitation.

## 2021-06-22 NOTE — Progress Notes (Signed)
Notified MD Hal Hope of patient's passing. Time of death is 04-11-2303. Funeral home is [place] [facility] [place]-have not notified as of yet. Trying to reach patient's POA Danley Danker but received no answer and vm is full. Donation stated that patient's POA needs to be contacted then we can call them back. Will try again to reach.

## 2021-06-22 NOTE — Progress Notes (Signed)
Nutrition Brief Note  Chart reviewed. Pt now transitioning to comfort care.  No further nutrition interventions planned at this time.  Please re-consult as needed.   Mishawn Didion W, RD, LDN, CDCES Registered Dietitian II Certified Diabetes Care and Education Specialist Please refer to AMION for RD and/or RD on-call/weekend/after hours pager   

## 2021-06-23 DIAGNOSIS — J9621 Acute and chronic respiratory failure with hypoxia: Secondary | ICD-10-CM | POA: Diagnosis not present

## 2021-06-23 DIAGNOSIS — J9622 Acute and chronic respiratory failure with hypercapnia: Secondary | ICD-10-CM | POA: Diagnosis not present

## 2021-06-25 LAB — BLOOD GAS, VENOUS
Acid-Base Excess: 9.8 mmol/L — ABNORMAL HIGH (ref 0.0–2.0)
Acid-Base Excess: 0.2 mmol/L (ref 0.0–2.0)
Bicarbonate: 44.2 mmol/L — ABNORMAL HIGH (ref 20.0–28.0)
Bicarbonate: 33.2 mmol/L — ABNORMAL HIGH (ref 20.0–28.0)
O2 Saturation: 34.8 %
O2 Saturation: 98.5 %
Patient temperature: 37
Patient temperature: 37
pCO2, Ven: >123 mmHg (ref 44–60)
pCO2, Ven: 107 mmHg (ref 44–60)
pH, Ven: 7.13 — CL (ref 7.25–7.43)
pH, Ven: 7.1 — CL (ref 7.25–7.43)
pO2, Ven: 190 mmHg — ABNORMAL HIGH (ref 32–45)

## 2021-06-26 LAB — CULTURE, BLOOD (ROUTINE X 2)
Culture: NO GROWTH
Culture: NO GROWTH

## 2021-07-15 ENCOUNTER — Other Ambulatory Visit: Payer: Medicare Other | Admitting: Student

## 2021-07-18 NOTE — Death Summary Note (Signed)
   DEATH SUMMARY   Patient Details  Name: Kevin Nelson MRN: [id number] DOB: 09-20-1950 LFY:BOFB, Almyra Free, NP Admission/Discharge Information   Admit Date:  2021-07-07  Date of Death: Date of Death: 07/08/2021  Time of Death: Time of Death: April 18, 2303  Length of Stay: 2   Principle Cause of death: Acute on chronic hypoxic and hypercarbic respiratory failure. Due to bilateral pneumonia and end stage COPD  Hospital Diagnoses: Principal Problem:   Acute on chronic respiratory failure [facility]) Active Problems:   Community acquired pneumonia  Hospital Course: 71 year old patient with severe comorbid medical conditions including advanced COPD chronic hypoxemia with FEV1 less than 15% as of May 2023, laryngeal cancer status post multiple treatments with SBRT, active tobacco abuse despite using 5 L of oxygen at rest, alcoholism, chronic opioid dependence on morphine daily came in with severe hypoxemia and hypercapnia. patient was DNR/DNI.  Pt was admitted to the ICU. But after discussion between Dr Lanney Gins and family, family chose to transition to comfort care.  Pt was stared on morphine infusion. It was anticipated that the pt will expire in 24-48 hours.  Pt passed away on July 08, 2021 at 23:05.   Acute on chronic hypoxic and hypercarbic respiratory failure. Bilateral pneumonia. End-stage COPD. Acute encephalopathy. OSA. Underweight Body mass index is 15.54 kg/m.  Cachexia and failure to thrive in adult due to end stage COPD.  Elevated troponin  Leucocytosis   Procedures: none  Consultations: PCCM primary admission  The results of significant diagnostics from this hospitalization (including imaging, microbiology, ancillary and laboratory) are listed below for reference.   Significant Diagnostic Studies: DG Chest 1 View  Result Date: 07/07/2021 CLINICAL DATA:  71 year old male respiratory distress. EXAM: CHEST  1 VIEW COMPARISON:  Portable chest 08/30/2020 and earlier. FINDINGS: Portable AP  upright view at 0412 hours. Chronic pulmonary hyperinflation, centrilobular emphysema by CT in 04-18-2019. Widespread, asymmetric bilateral coarse and confluent i acute pulmonary opacity. Left upper lobe and right perihilar, infrahilar lung most affected. No superimposed pneumothorax. No definite pleural effusion. Underlying cardiac and mediastinal contours remain within normal limits. Visualized tracheal air column is within normal limits. No acute osseous abnormality identified. Partially visible gas distended stomach. IMPRESSION: Emphysema ([id number]) with extensive acute airspace opacity in both lungs compatible with Severe Bilateral Pneumonia. No pneumothorax or definite pleural effusion. Electronically Signed   By: Genevie Ann M.D.   On: 2021/07/07 04:29    Microbiology: No results found for this or any previous visit (from the past 240 hour(s)).  Time spent: 30 minutes  Signed: Berle Mull, MD

## 2021-07-18 DEATH — deceased
